# Patient Record
Sex: Male | Born: 1955
Health system: Southern US, Community
[De-identification: ages and names within clinical notes are randomized; demographics above are authoritative.]

## PROBLEM LIST (undated history)

## (undated) ENCOUNTER — Emergency Department (HOSPITAL_COMMUNITY): Admission: EM | Disposition: A | Discharge status: Home / Self Care | Payer: Self-pay

## (undated) DIAGNOSIS — J45909 Unspecified asthma, uncomplicated: Secondary | ICD-10-CM

## (undated) DIAGNOSIS — J449 Chronic obstructive pulmonary disease, unspecified: Secondary | ICD-10-CM

## (undated) DIAGNOSIS — I10 Essential (primary) hypertension: Secondary | ICD-10-CM

## (undated) HISTORY — PX: HAND SURGERY: SHX662

---

## 2004-03-05 ENCOUNTER — Emergency Department (HOSPITAL_COMMUNITY): Admission: EM | Admit: 2004-03-05 | Discharge: 2004-03-05 | Payer: Self-pay | Admitting: *Deleted

## 2004-09-27 ENCOUNTER — Emergency Department (HOSPITAL_COMMUNITY): Admission: EM | Admit: 2004-09-27 | Discharge: 2004-09-27 | Payer: Self-pay | Admitting: Family Medicine

## 2005-07-25 ENCOUNTER — Emergency Department (HOSPITAL_COMMUNITY): Admission: EM | Admit: 2005-07-25 | Discharge: 2005-07-25 | Payer: Self-pay | Admitting: Emergency Medicine

## 2005-07-29 ENCOUNTER — Inpatient Hospital Stay (HOSPITAL_COMMUNITY): Admission: EM | Admit: 2005-07-29 | Discharge: 2005-07-31 | Payer: Self-pay | Admitting: Emergency Medicine

## 2005-07-29 ENCOUNTER — Ambulatory Visit: Payer: Self-pay | Admitting: *Deleted

## 2005-07-31 ENCOUNTER — Encounter: Payer: Self-pay | Admitting: Cardiovascular Disease

## 2006-02-03 ENCOUNTER — Ambulatory Visit: Payer: Self-pay | Admitting: Family Medicine

## 2006-03-06 ENCOUNTER — Ambulatory Visit: Payer: Self-pay | Admitting: Family Medicine

## 2007-05-02 ENCOUNTER — Emergency Department (HOSPITAL_COMMUNITY): Admission: EM | Admit: 2007-05-02 | Discharge: 2007-05-02 | Payer: Self-pay | Admitting: Emergency Medicine

## 2007-05-06 ENCOUNTER — Emergency Department (HOSPITAL_COMMUNITY): Admission: EM | Admit: 2007-05-06 | Discharge: 2007-05-06 | Payer: Self-pay | Admitting: Emergency Medicine

## 2009-07-03 ENCOUNTER — Emergency Department (HOSPITAL_COMMUNITY): Admission: EM | Admit: 2009-07-03 | Discharge: 2009-07-03 | Payer: Self-pay | Admitting: Emergency Medicine

## 2010-04-01 LAB — POCT I-STAT, CHEM 8
BUN: 7 mg/dL (ref 6–23)
Creatinine, Ser: 1.1 mg/dL (ref 0.4–1.5)
Glucose, Bld: 145 mg/dL — ABNORMAL HIGH (ref 70–99)
Potassium: 4.2 mEq/L (ref 3.5–5.1)
Sodium: 137 mEq/L (ref 135–145)
TCO2: 29 mmol/L (ref 0–100)

## 2010-04-01 LAB — CBC
Hemoglobin: 14.4 g/dL (ref 13.0–17.0)
Platelets: 155 10*3/uL (ref 150–400)

## 2010-04-01 LAB — DIFFERENTIAL
Basophils Absolute: 0 10*3/uL (ref 0.0–0.1)
Basophils Relative: 0 % (ref 0–1)
Eosinophils Absolute: 0 10*3/uL (ref 0.0–0.7)
Eosinophils Relative: 0 % (ref 0–5)
Lymphocytes Relative: 12 % (ref 12–46)
Neutro Abs: 7.9 10*3/uL — ABNORMAL HIGH (ref 1.7–7.7)
Neutrophils Relative %: 74 % (ref 43–77)

## 2010-04-01 LAB — POCT CARDIAC MARKERS
CKMB, poc: 1.3 ng/mL (ref 1.0–8.0)
Myoglobin, poc: 120 ng/mL (ref 12–200)
Troponin i, poc: <0.05 ng/mL (ref 0.00–0.09)
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
## 2010-12-11 ENCOUNTER — Encounter: Payer: Self-pay | Admitting: Emergency Medicine

## 2010-12-11 ENCOUNTER — Emergency Department (HOSPITAL_COMMUNITY)
Admission: EM | Admit: 2010-12-11 | Discharge: 2010-12-11 | Disposition: A | Discharge status: Home / Self Care | Payer: No Typology Code available for payment source | Attending: Emergency Medicine | Admitting: Emergency Medicine

## 2010-12-11 DIAGNOSIS — L989 Disorder of the skin and subcutaneous tissue, unspecified: Secondary | ICD-10-CM | POA: Insufficient documentation

## 2010-12-11 DIAGNOSIS — M549 Dorsalgia, unspecified: Secondary | ICD-10-CM

## 2010-12-11 DIAGNOSIS — I10 Essential (primary) hypertension: Secondary | ICD-10-CM | POA: Insufficient documentation

## 2010-12-11 DIAGNOSIS — F17200 Nicotine dependence, unspecified, uncomplicated: Secondary | ICD-10-CM | POA: Insufficient documentation

## 2010-12-11 HISTORY — DX: Essential (primary) hypertension: I10

## 2010-12-11 MED ORDER — DIAZEPAM 5 MG PO TABS: 5.0000 mg | ORAL_TABLET | Freq: Every day | ORAL | Status: AC

## 2010-12-11 MED ORDER — ACETAMINOPHEN 500 MG PO TABS: 1000.0000 mg | ORAL_TABLET | Freq: Three times a day (TID) | ORAL | Status: AC | PRN

## 2010-12-11 MED ORDER — ACETAMINOPHEN 500 MG PO TABS
1000.0000 mg | ORAL_TABLET | Freq: Once | ORAL | Status: AC
  Administered 2010-12-11: 1000 mg via ORAL
  Filled 2010-12-11: qty 2

## 2010-12-11 MED ORDER — IBUPROFEN 800 MG PO TABS: 800.0000 mg | ORAL_TABLET | Freq: Three times a day (TID) | ORAL | Status: AC

## 2010-12-11 NOTE — ED Notes (Signed)
Pt ambulated to window with 3 prescrips with understanding of plan of care.
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
## 2010-12-11 NOTE — ED Notes (Signed)
Pt states also having headaches.
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
## 2010-12-17 ENCOUNTER — Emergency Department (HOSPITAL_COMMUNITY)
Admission: EM | Admit: 2010-12-17 | Discharge: 2010-12-17 | Discharge status: Against Medical Advice | Payer: No Typology Code available for payment source | Attending: Emergency Medicine | Admitting: Emergency Medicine

## 2010-12-17 DIAGNOSIS — R51 Headache: Secondary | ICD-10-CM | POA: Insufficient documentation

## 2010-12-17 DIAGNOSIS — M542 Cervicalgia: Secondary | ICD-10-CM | POA: Insufficient documentation

## 2011-04-03 ENCOUNTER — Emergency Department (HOSPITAL_COMMUNITY)
Admission: EM | Admit: 2011-04-03 | Discharge: 2011-04-03 | Discharge status: Against Medical Advice | Payer: Self-pay | Attending: Emergency Medicine | Admitting: Emergency Medicine

## 2011-04-03 ENCOUNTER — Emergency Department (HOSPITAL_COMMUNITY): Payer: Self-pay

## 2011-04-03 ENCOUNTER — Other Ambulatory Visit: Payer: Self-pay

## 2011-04-03 ENCOUNTER — Encounter (HOSPITAL_COMMUNITY): Payer: Self-pay | Admitting: Adult Health

## 2011-04-03 DIAGNOSIS — J449 Chronic obstructive pulmonary disease, unspecified: Secondary | ICD-10-CM | POA: Insufficient documentation

## 2011-04-03 DIAGNOSIS — J4489 Other specified chronic obstructive pulmonary disease: Secondary | ICD-10-CM | POA: Insufficient documentation

## 2011-04-03 DIAGNOSIS — R0602 Shortness of breath: Secondary | ICD-10-CM | POA: Insufficient documentation

## 2011-04-03 LAB — BASIC METABOLIC PANEL
BUN: 14 mg/dL (ref 6–23)
CO2: 24 mEq/L (ref 19–32)
Calcium: 8.7 mg/dL (ref 8.4–10.5)
GFR calc Af Amer: >90 mL/min (ref >90)
Glucose, Bld: 109 mg/dL — ABNORMAL HIGH (ref 70–99)
Potassium: 3.2 mEq/L — ABNORMAL LOW (ref 3.5–5.1)
Sodium: 136 mEq/L (ref 135–145)

## 2011-04-03 LAB — CBC
MCH: 31.8 pg (ref 26.0–34.0)
MCHC: 34.6 g/dL (ref 30.0–36.0)
MCV: 91.9 fL (ref 78.0–100.0)
Platelets: 171 10*3/uL (ref 150–400)
WBC: 8.8 10*3/uL (ref 4.0–10.5)

## 2011-04-03 MED ORDER — ACETAMINOPHEN 325 MG PO TABS
650.0000 mg | ORAL_TABLET | Freq: Once | ORAL | Status: AC
  Administered 2011-04-03: 650 mg via ORAL
  Filled 2011-04-03: qty 2

## 2011-04-03 MED ORDER — ALBUTEROL SULFATE (5 MG/ML) 0.5% IN NEBU
2.5000 mg | INHALATION_SOLUTION | Freq: Once | RESPIRATORY_TRACT | Status: AC
  Administered 2011-04-03: 5 mg via RESPIRATORY_TRACT
  Filled 2011-04-03: qty 1

## 2011-04-03 MED ORDER — IPRATROPIUM BROMIDE 0.02 % IN SOLN
0.5000 mg | Freq: Once | RESPIRATORY_TRACT | Status: AC
  Administered 2011-04-03: 0.5 mg via RESPIRATORY_TRACT
  Filled 2011-04-03: qty 2.5

## 2011-04-03 NOTE — ED Notes (Signed)
SOB that began 2 weeks ago and has gotten worse today, especially with exertion. HR 120, SATS on RA 96. EKG done. Inspiratory wheezes bilaterally, productive cough.,

## 2011-04-04 ENCOUNTER — Other Ambulatory Visit: Payer: Self-pay

## 2011-04-04 ENCOUNTER — Encounter (HOSPITAL_COMMUNITY): Payer: Self-pay

## 2011-04-04 ENCOUNTER — Emergency Department (HOSPITAL_COMMUNITY): Payer: Self-pay

## 2011-04-04 ENCOUNTER — Emergency Department (HOSPITAL_COMMUNITY)
Admission: EM | Admit: 2011-04-04 | Discharge: 2011-04-04 | Disposition: A | Discharge status: Home / Self Care | Payer: Self-pay | Attending: Emergency Medicine | Admitting: Emergency Medicine

## 2011-04-04 DIAGNOSIS — J441 Chronic obstructive pulmonary disease with (acute) exacerbation: Secondary | ICD-10-CM | POA: Insufficient documentation

## 2011-04-04 DIAGNOSIS — R059 Cough, unspecified: Secondary | ICD-10-CM | POA: Insufficient documentation

## 2011-04-04 DIAGNOSIS — R0602 Shortness of breath: Secondary | ICD-10-CM | POA: Insufficient documentation

## 2011-04-04 DIAGNOSIS — R05 Cough: Secondary | ICD-10-CM | POA: Insufficient documentation

## 2011-04-04 DIAGNOSIS — R062 Wheezing: Secondary | ICD-10-CM | POA: Insufficient documentation

## 2011-04-04 MED ORDER — ALBUTEROL SULFATE (5 MG/ML) 0.5% IN NEBU
INHALATION_SOLUTION | RESPIRATORY_TRACT | Status: AC
  Filled 2011-04-04: qty 1

## 2011-04-04 MED ORDER — ALBUTEROL SULFATE HFA 108 (90 BASE) MCG/ACT IN AERS
2.0000 | INHALATION_SPRAY | RESPIRATORY_TRACT | Status: DC
  Administered 2011-04-04: 2 via RESPIRATORY_TRACT
  Filled 2011-04-04: qty 6.7

## 2011-04-04 MED ORDER — PREDNISONE 20 MG PO TABS
60.0000 mg | ORAL_TABLET | Freq: Once | ORAL | Status: AC
  Administered 2011-04-04: 60 mg via ORAL
  Filled 2011-04-04: qty 3

## 2011-04-04 MED ORDER — IPRATROPIUM BROMIDE 0.02 % IN SOLN
RESPIRATORY_TRACT | Status: AC
  Filled 2011-04-04: qty 2.5

## 2011-04-04 MED ORDER — ALBUTEROL SULFATE (5 MG/ML) 0.5% IN NEBU
5.0000 mg | INHALATION_SOLUTION | Freq: Once | RESPIRATORY_TRACT | Status: AC
  Administered 2011-04-04: 5 mg via RESPIRATORY_TRACT
  Filled 2011-04-04: qty 0.5

## 2011-04-04 MED ORDER — PREDNISONE 10 MG PO TABS: 60.0000 mg | ORAL_TABLET | Freq: Every day | ORAL | Status: DC

## 2011-04-04 MED ORDER — IPRATROPIUM BROMIDE 0.02 % IN SOLN
0.5000 mg | Freq: Once | RESPIRATORY_TRACT | Status: AC
  Administered 2011-04-04: 0.5 mg via RESPIRATORY_TRACT
  Filled 2011-04-04: qty 2.5

## 2011-04-04 NOTE — Discharge Instructions (Signed)

## 2011-04-04 NOTE — ED Notes (Signed)
RT called

## 2011-04-04 NOTE — ED Provider Notes (Signed)
History     CSN: [id number]  Arrival date & time 04/04/11  8295   First MD Initiated Contact with Patient 04/04/11 1035      Chief Complaint  Patient presents with  . Cough     Patient is a 56 y.o. male presenting with cough. The history is provided by the patient.  Cough   the patient reports cough for several days with new worsening shortness of breath.  His shortness of breath is exertional.  He has no exertional chest pain jaw pain arm pain shoulder pain or back pain.  He denies nausea vomiting or diaphoresis.  He is a long-time smoker and reports productive cough.  He was seen in the ER last night and evaluated to the triage process but never sought provider and left the ER prior to being seen because of the long wait.  His labs obtained last night were normal.  He presented back to the ER today for evaluation since he was never seen last night.  He reports no orthopnea or paroxysmal nocturnal dyspnea.  He denies chest pain.  He has no prior cardiac history.  He does not see a primary care Dr. regularly.  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  At this time he expresses no significant shortness of breath.  He has had no recent long travel or surgery.  He denies unilateral leg swelling.  He has no prior history of cardiac disease.  He has no prior history of DVT or pulmonary embolism.  History reviewed. No pertinent past medical history.  Past Surgical History  Procedure Date  . Hand surgery     No family history on file.  History  Substance Use Topics  . Smoking status: Current Everyday Smoker -- 2.0 packs/day    Types: Cigarettes  . Smokeless tobacco: Not on file  . Alcohol Use: Yes      Review of Systems  Respiratory: Positive for cough.   All other systems reviewed and are negative.    Allergies  Codeine and Penicillins  Home Medications   Current Outpatient Rx  Name Route Sig Dispense Refill  . ALBUTEROL SULFATE HFA 108 (90 BASE) MCG/ACT IN AERS  Inhalation Inhale 2 puffs into the lungs every 6 (six) hours as needed. wheezing     . GUAIFENESIN 100 MG/5ML PO SOLN Oral Take 15 mLs by mouth every 4 (four) hours as needed. For cough/congestion    . IBUPROFEN 200 MG PO TABS Oral Take 200 mg by mouth every 6 (six) hours as needed. For pain    . PREDNISONE 10 MG PO TABS Oral Take 6 tablets (60 mg total) by mouth daily. 30 tablet 0    BP 116/74  Pulse 96  Temp(Src) 99 F (37.2 C) (Oral)  Resp 16  SpO2 97%  Physical Exam  Nursing note and vitals reviewed. Constitutional: He is oriented to person, place, and time. He appears well-developed and well-nourished.  HENT:  Head: Normocephalic and atraumatic.  Eyes: EOM are normal.  Neck: Normal range of motion.  Cardiovascular: Normal rate, regular rhythm, normal heart sounds and intact distal pulses.   Pulmonary/Chest: Effort normal. No respiratory distress. He has wheezes.       Mild wheezing bilaterally  Abdominal: Soft. He exhibits no distension. There is no tenderness.  Musculoskeletal: Normal range of motion.  Neurological: He is alert and oriented to person, place, and time.  Skin: Skin is warm and dry.  Psychiatric: He has a normal mood and  affect. Judgment normal.    ED Course  Procedures (including critical care time)  Date: 04/04/2011  Rate: 91  Rhythm: normal sinus rhythm  QRS Axis: normal  Intervals: normal  ST/T Wave abnormalities: normal  Conduction Disutrbances: none  Narrative Interpretation:   Old EKG Reviewed: No significant changes noted     Labs Reviewed  TROPONIN I   Dg Chest 2 View  04/04/2011  *RADIOLOGY REPORT*  Clinical Data: Cough and fever.  Tachycardia.  CHEST - 2 VIEW  Comparison: 04/03/2011.  Findings: Trachea is midline.  Heart size normal.  Lungs are hyperinflated but clear.  No pleural fluid.  Degenerative changes are seen in the spine.  IMPRESSION: No acute findings.  Original Report Authenticated By: Reyes Ivan, M.D.   Dg Chest 2  View  04/03/2011  *RADIOLOGY REPORT*  Clinical Data: SOB  CHEST - 2 VIEW  Comparison: 07/03/2009  Findings: Heart size and mediastinal contours are normal.  No pleural effusion or edema identified.  No airspace consolidation.  Lungs are hyperinflated and there are coarsened interstitial markings.  Multilevel thoracic spondylosis noted.  IMPRESSION:  1.  No acute findings. 2.  COPD.  Original Report Authenticated By: Rosealee Albee, M.D.   I personally reviewed his x-ray  1. COPD exacerbation       MDM  The patient has evidence of chronic bronchitis and likely COPD exacerbation.  He's been instructed to stop smoking cigarettes.  He needs a primary care Dr.  He feels much better after breathing treatment in the ER.  His chest x-ray is clear.  His EKG is normal sinus rhythm.  His labs obtained last night were within normal limits.  DC home in good condition.  He understands to return to the ER for new or worsening symptoms        Lyanne Co, MD 04/04/11 1233

## 2012-01-29 ENCOUNTER — Encounter (HOSPITAL_COMMUNITY): Payer: Self-pay

## 2012-01-29 ENCOUNTER — Emergency Department (HOSPITAL_COMMUNITY): Payer: Self-pay

## 2012-01-29 ENCOUNTER — Emergency Department (HOSPITAL_COMMUNITY)
Admission: EM | Admit: 2012-01-29 | Discharge: 2012-01-29 | Disposition: A | Discharge status: Home / Self Care | Payer: Self-pay | Attending: Emergency Medicine | Admitting: Emergency Medicine

## 2012-01-29 DIAGNOSIS — Z79899 Other long term (current) drug therapy: Secondary | ICD-10-CM | POA: Insufficient documentation

## 2012-01-29 DIAGNOSIS — F17200 Nicotine dependence, unspecified, uncomplicated: Secondary | ICD-10-CM | POA: Insufficient documentation

## 2012-01-29 DIAGNOSIS — J111 Influenza due to unidentified influenza virus with other respiratory manifestations: Secondary | ICD-10-CM | POA: Insufficient documentation

## 2012-01-29 DIAGNOSIS — J441 Chronic obstructive pulmonary disease with (acute) exacerbation: Secondary | ICD-10-CM | POA: Insufficient documentation

## 2012-01-29 LAB — GLUCOSE, CAPILLARY: Glucose-Capillary: 116 mg/dL — ABNORMAL HIGH (ref 70–99)

## 2012-01-29 MED ORDER — OSELTAMIVIR PHOSPHATE 75 MG PO CAPS: 75.0000 mg | ORAL_CAPSULE | Freq: Two times a day (BID) | ORAL | Status: DC

## 2012-01-29 MED ORDER — ALBUTEROL SULFATE (5 MG/ML) 0.5% IN NEBU
5.0000 mg | INHALATION_SOLUTION | Freq: Once | RESPIRATORY_TRACT | Status: AC
  Administered 2012-01-29: 5 mg via RESPIRATORY_TRACT
  Filled 2012-01-29: qty 1

## 2012-01-29 MED ORDER — PREDNISONE 20 MG PO TABS
60.0000 mg | ORAL_TABLET | Freq: Once | ORAL | Status: AC
  Administered 2012-01-29: 60 mg via ORAL
  Filled 2012-01-29: qty 3

## 2012-01-29 MED ORDER — ALBUTEROL SULFATE HFA 108 (90 BASE) MCG/ACT IN AERS: 2.0000 | INHALATION_SPRAY | RESPIRATORY_TRACT | Status: DC | PRN

## 2012-01-29 MED ORDER — PREDNISONE 50 MG PO TABS: ORAL_TABLET | ORAL | Status: DC

## 2012-01-29 MED ORDER — DOXYCYCLINE HYCLATE 100 MG PO CAPS: 100.0000 mg | ORAL_CAPSULE | Freq: Two times a day (BID) | ORAL | Status: DC

## 2012-01-29 MED ORDER — ACETAMINOPHEN 325 MG PO TABS
650.0000 mg | ORAL_TABLET | Freq: Once | ORAL | Status: AC
  Administered 2012-01-29: 650 mg via ORAL
  Filled 2012-01-29: qty 2

## 2012-01-29 MED ORDER — ALBUTEROL SULFATE HFA 108 (90 BASE) MCG/ACT IN AERS
2.0000 | INHALATION_SPRAY | Freq: Once | RESPIRATORY_TRACT | Status: AC
  Administered 2012-01-29: 2 via RESPIRATORY_TRACT
  Filled 2012-01-29: qty 6.7

## 2012-01-29 MED ORDER — IPRATROPIUM BROMIDE 0.02 % IN SOLN
0.5000 mg | Freq: Once | RESPIRATORY_TRACT | Status: AC
  Administered 2012-01-29: 0.5 mg via RESPIRATORY_TRACT
  Filled 2012-01-29: qty 2.5

## 2012-01-29 NOTE — ED Notes (Signed)
Pt. Has had flu-like symptoms for 2 days,   Body aches, fever, cough.  Pt.'s mother is giving him Mucinex for the flu.

## 2012-01-29 NOTE — ED Provider Notes (Signed)
History  This chart was scribed for Glynn Octave, MD by Bennett Scrape, ED Scribe. This patient was seen in room A02C/A02C and the patient's care was started at 8:53 AM.  CSN: [id number]  Arrival date & time 01/29/12  0845   First MD Initiated Contact with Patient 01/29/12 [phone]      Chief Complaint  Patient presents with  . Influenza     The history is provided by the patient. No language interpreter was used.    [name] is a 57 y.o. male who presents to the Emergency Department complaining of 2 days of gradual onset, gradually worsening, constant SOB with associated nasal congestion, nonproductive cough, subjective fevers, mild HA, myalgias, chills, and non-bloody, watery diarrhea. He states that the SOB is worse with laying down but denies that it is worse with exertion. He states that he has been taking mucinex with no improvement in his symptoms. He denies sore throat, CP, and abdominal pain as associated symptoms. He states that he has a h/o COPD and "sometimes uses inhalers" but denies using inhalers recently. He does not have a h/o other chronic medical conditions and denies taking daily medications. He denies getting an influenza vaccine. He is an everyday smoker and alcohol user.  History reviewed. No pertinent past medical history.  Past Surgical History  Procedure Date  . Hand surgery     No family history on file.  History  Substance Use Topics  . Smoking status: Current Every Day Smoker -- 2.0 packs/day    Types: Cigarettes  . Smokeless tobacco: Not on file  . Alcohol Use: Yes      Review of Systems  A complete 10 system review of systems was obtained and all systems are negative except as noted in the HPI and PMH.   Allergies  Codeine and Penicillins  Home Medications   Current Outpatient Rx  Name  Route  Sig  Dispense  Refill  . ALBUTEROL SULFATE HFA 108 (90 BASE) MCG/ACT IN AERS   Inhalation   Inhale 2 puffs into the lungs every 6  (six) hours as needed. wheezing          . PREDNISONE 10 MG PO TABS   Oral   Take 6 tablets (60 mg total) by mouth daily.   30 tablet   0     Triage Vitals: BP 147/87  Pulse 102  Temp 100.2 F (37.9 C) (Oral)  Resp 18  SpO2 96%  Physical Exam  Nursing note and vitals reviewed. Constitutional: He is oriented to person, place, and time. He appears well-developed and well-nourished. No distress.  HENT:  Head: Normocephalic and atraumatic.  Mouth/Throat: Oropharynx is clear and moist.       No sinus tenderness  Eyes: Conjunctivae normal and EOM are normal. Pupils are equal, round, and reactive to light.  Neck: Neck supple. No tracheal deviation present.       No menin  Cardiovascular: Normal rate and regular rhythm.   Pulmonary/Chest: Effort normal and breath sounds normal. No respiratory distress.       Decreased breath sounds with scattered wheezing  Abdominal: Soft. There is no tenderness.  Musculoskeletal: Normal range of motion. He exhibits no edema (no leg swelling).  Neurological: He is alert and oriented to person, place, and time.  Skin: Skin is warm and dry.  Psychiatric: He has a normal mood and affect. His behavior is normal.    ED Course  Procedures (including critical care time)  DIAGNOSTIC STUDIES:  Oxygen Saturation is 96% on room air, adequate by my interpretation.    COORDINATION OF CARE: 9:09 AM- Discussed treatment plan which includes CXR and breathing treatment with pt at bedside and pt agreed to plan.   9:15 AM- Ordered 60 mg prednisone tablet, 5 mg of 0.5% albuterol nebulizer solution and 0.5 mg of Atrovent solution.  10:15 AM-  12:02 PM-Pt rechecked and is resting comfortably. Upon re-exam, pt's lungs are clear. Discussed discharge plan of steroids and antibiotics. Advised pt to follow up with a PCP.  Labs Reviewed - No data to display Dg Chest 2 View  01/29/2012  *RADIOLOGY REPORT*  Clinical Data: Cough  CHEST - 2 VIEW  Comparison: Prior  chest x-ray 04/04/2011  Findings: Similar degree of pulmonary hyperexpansion with flattening of the diaphragm and increased retrosternal clear space on the lateral view.  Diffuse mild central airway thickening and peribronchial cuffing is also similar to prior.  Unchanged cardiac and mediastinal contours which are within normal limits.  No acute osseous abnormality.  IMPRESSION:  1.  No acute cardiopulmonary disease  2.  Stable background changes of pulmonary hyperexpansion and bronchial wall thickening suggestive of chronic bronchitis/COPD   Original Report Authenticated By: Malachy Moan, M.D.      No diagnosis found.    MDM  2 days of body aches, fever, cough and congestion. History of COPD, active smoker. Denies chest pain, leg pain or leg swelling.  CXR negative. Scattered wheezing on exam. Given nebs, steroids. Ambulatory in ED without desaturation. We'll treat patient for bronchitis and empirically for influenza. Smoking cessation encouraged.    Date: 01/29/2012  Rate: 99  Rhythm: normal sinus rhythm  QRS Axis: normal  Intervals: normal  ST/T Wave abnormalities: normal  Conduction Disutrbances:none  Narrative Interpretation:   Old EKG Reviewed: unchanged    I personally performed the services described in this documentation, which was scribed in my presence. The recorded information has been reviewed and is accurate.    Glynn Octave, MD 01/29/12 [phone]

## 2012-02-03 ENCOUNTER — Emergency Department (HOSPITAL_COMMUNITY): Payer: Self-pay

## 2012-02-03 ENCOUNTER — Encounter (HOSPITAL_COMMUNITY): Payer: Self-pay | Admitting: *Deleted

## 2012-02-03 ENCOUNTER — Inpatient Hospital Stay (HOSPITAL_COMMUNITY)
Admission: EM | Admit: 2012-02-03 | Discharge: 2012-02-06 | DRG: 193 | Disposition: A | Discharge status: Home / Self Care | Payer: MEDICAID | Attending: Internal Medicine | Admitting: Internal Medicine

## 2012-02-03 DIAGNOSIS — J9601 Acute respiratory failure with hypoxia: Secondary | ICD-10-CM

## 2012-02-03 DIAGNOSIS — Z720 Tobacco use: Secondary | ICD-10-CM | POA: Diagnosis present

## 2012-02-03 DIAGNOSIS — R0602 Shortness of breath: Secondary | ICD-10-CM

## 2012-02-03 DIAGNOSIS — Z6830 Body mass index (BMI) 30.0-30.9, adult: Secondary | ICD-10-CM

## 2012-02-03 DIAGNOSIS — E669 Obesity, unspecified: Secondary | ICD-10-CM

## 2012-02-03 DIAGNOSIS — J9600 Acute respiratory failure, unspecified whether with hypoxia or hypercapnia: Secondary | ICD-10-CM | POA: Diagnosis present

## 2012-02-03 DIAGNOSIS — F17200 Nicotine dependence, unspecified, uncomplicated: Secondary | ICD-10-CM

## 2012-02-03 DIAGNOSIS — Z880 Allergy status to penicillin: Secondary | ICD-10-CM

## 2012-02-03 DIAGNOSIS — J441 Chronic obstructive pulmonary disease with (acute) exacerbation: Secondary | ICD-10-CM | POA: Diagnosis present

## 2012-02-03 DIAGNOSIS — E876 Hypokalemia: Secondary | ICD-10-CM | POA: Diagnosis present

## 2012-02-03 DIAGNOSIS — J9602 Acute respiratory failure with hypercapnia: Secondary | ICD-10-CM

## 2012-02-03 DIAGNOSIS — J189 Pneumonia, unspecified organism: Principal | ICD-10-CM | POA: Diagnosis present

## 2012-02-03 HISTORY — DX: Chronic obstructive pulmonary disease, unspecified: J44.9

## 2012-02-03 LAB — CBC WITH DIFFERENTIAL/PLATELET
Basophils Absolute: 0.2 10*3/uL — ABNORMAL HIGH (ref 0.0–0.1)
Basophils Relative: 2 % — ABNORMAL HIGH (ref 0–1)
Eosinophils Absolute: 0 10*3/uL (ref 0.0–0.7)
Eosinophils Relative: 0 % (ref 0–5)
HCT: 39.5 % (ref 39.0–52.0)
Hemoglobin: 13.8 g/dL (ref 13.0–17.0)
Lymphocytes Relative: 13 % (ref 12–46)
Lymphs Abs: 1.3 10*3/uL (ref 0.7–4.0)
MCH: 32.5 pg (ref 26.0–34.0)
MCHC: 34.9 g/dL (ref 30.0–36.0)
MCV: 92.9 fL (ref 78.0–100.0)
Monocytes Absolute: 1.3 10*3/uL — ABNORMAL HIGH (ref 0.1–1.0)
Monocytes Relative: 13 % — ABNORMAL HIGH (ref 3–12)
Neutro Abs: 7 10*3/uL (ref 1.7–7.7)
Neutrophils Relative %: 72 % (ref 43–77)
Platelets: 200 10*3/uL (ref 150–400)
RBC: 4.25 MIL/uL (ref 4.22–5.81)
RDW: 14.5 % (ref 11.5–15.5)
WBC: 9.8 10*3/uL (ref 4.0–10.5)

## 2012-02-03 LAB — BASIC METABOLIC PANEL
BUN: 11 mg/dL (ref 6–23)
CO2: 30 mEq/L (ref 19–32)
Calcium: 8.7 mg/dL (ref 8.4–10.5)
Chloride: 93 mEq/L — ABNORMAL LOW (ref 96–112)
Creatinine, Ser: 0.82 mg/dL (ref 0.50–1.35)
GFR calc Af Amer: >90 mL/min (ref >90)
GFR calc non Af Amer: >90 mL/min (ref >90)
Glucose, Bld: 170 mg/dL — ABNORMAL HIGH (ref 70–99)
Potassium: 3 mEq/L — ABNORMAL LOW (ref 3.5–5.1)
Sodium: 134 mEq/L — ABNORMAL LOW (ref 135–145)

## 2012-02-03 LAB — INFLUENZA PANEL BY PCR (TYPE A & B)
H1N1 flu by pcr: NOT DETECTED
Influenza A By PCR: NEGATIVE
Influenza B By PCR: NEGATIVE

## 2012-02-03 LAB — MAGNESIUM: Magnesium: 2.3 mg/dL (ref 1.5–2.5)

## 2012-02-03 MED ORDER — ACETAMINOPHEN 325 MG PO TABS: 650.0000 mg | ORAL_TABLET | Freq: Four times a day (QID) | ORAL | Status: DC | PRN

## 2012-02-03 MED ORDER — SODIUM CHLORIDE 0.9 % IV SOLN: 250.0000 mL | INTRAVENOUS | Status: DC | PRN

## 2012-02-03 MED ORDER — DEXTROSE 5 % IV SOLN
1.0000 g | INTRAVENOUS | Status: DC
  Administered 2012-02-04 – 2012-02-05 (×2): 1 g via INTRAVENOUS
  Filled 2012-02-03 (×2): qty 10

## 2012-02-03 MED ORDER — SODIUM CHLORIDE 0.9 % IJ SOLN
3.0000 mL | Freq: Two times a day (BID) | INTRAMUSCULAR | Status: DC
  Administered 2012-02-04 – 2012-02-06 (×5): 3 mL via INTRAVENOUS

## 2012-02-03 MED ORDER — ALBUTEROL SULFATE HFA 108 (90 BASE) MCG/ACT IN AERS
2.0000 | INHALATION_SPRAY | RESPIRATORY_TRACT | Status: DC | PRN
  Filled 2012-02-03: qty 6.7

## 2012-02-03 MED ORDER — ALBUTEROL SULFATE (5 MG/ML) 0.5% IN NEBU
2.5000 mg | INHALATION_SOLUTION | Freq: Four times a day (QID) | RESPIRATORY_TRACT | Status: DC
  Administered 2012-02-03 – 2012-02-04 (×5): 2.5 mg via RESPIRATORY_TRACT
  Filled 2012-02-03 (×6): qty 0.5

## 2012-02-03 MED ORDER — SODIUM CHLORIDE 0.9 % IJ SOLN: 3.0000 mL | INTRAMUSCULAR | Status: DC | PRN

## 2012-02-03 MED ORDER — POTASSIUM CHLORIDE CRYS ER 20 MEQ PO TBCR
40.0000 meq | EXTENDED_RELEASE_TABLET | Freq: Once | ORAL | Status: AC
  Administered 2012-02-03: 40 meq via ORAL
  Filled 2012-02-03: qty 2

## 2012-02-03 MED ORDER — DEXTROSE 5 % IV SOLN
500.0000 mg | Freq: Once | INTRAVENOUS | Status: AC
  Administered 2012-02-03: 500 mg via INTRAVENOUS
  Filled 2012-02-03 (×2): qty 500

## 2012-02-03 MED ORDER — ACETAMINOPHEN 650 MG RE SUPP: 650.0000 mg | Freq: Four times a day (QID) | RECTAL | Status: DC | PRN

## 2012-02-03 MED ORDER — IPRATROPIUM BROMIDE 0.02 % IN SOLN
0.5000 mg | Freq: Once | RESPIRATORY_TRACT | Status: AC
  Administered 2012-02-03: 0.5 mg via RESPIRATORY_TRACT
  Filled 2012-02-03: qty 2.5

## 2012-02-03 MED ORDER — NICOTINE 21 MG/24HR TD PT24
21.0000 mg | MEDICATED_PATCH | Freq: Every day | TRANSDERMAL | Status: DC
  Administered 2012-02-03 – 2012-02-06 (×4): 21 mg via TRANSDERMAL
  Filled 2012-02-03 (×5): qty 1

## 2012-02-03 MED ORDER — METHYLPREDNISOLONE SODIUM SUCC 125 MG IJ SOLR
80.0000 mg | Freq: Four times a day (QID) | INTRAMUSCULAR | Status: DC
  Administered 2012-02-03 – 2012-02-04 (×3): 80 mg via INTRAVENOUS
  Filled 2012-02-03 (×6): qty 1.28

## 2012-02-03 MED ORDER — DEXTROSE 5 % IV SOLN
1.0000 g | Freq: Once | INTRAVENOUS | Status: AC
  Administered 2012-02-03: 1 g via INTRAVENOUS
  Filled 2012-02-03: qty 10

## 2012-02-03 MED ORDER — ALBUTEROL (5 MG/ML) CONTINUOUS INHALATION SOLN
10.0000 mg/h | INHALATION_SOLUTION | RESPIRATORY_TRACT | Status: AC
  Administered 2012-02-03: 10 mg/h via RESPIRATORY_TRACT

## 2012-02-03 MED ORDER — METHYLPREDNISOLONE SODIUM SUCC 125 MG IJ SOLR
125.0000 mg | Freq: Once | INTRAMUSCULAR | Status: AC
  Administered 2012-02-03: 125 mg via INTRAVENOUS
  Filled 2012-02-03: qty 2

## 2012-02-03 MED ORDER — SODIUM CHLORIDE 0.9 % IJ SOLN
3.0000 mL | Freq: Two times a day (BID) | INTRAMUSCULAR | Status: DC
  Administered 2012-02-04: 3 mL via INTRAVENOUS

## 2012-02-03 MED ORDER — GUAIFENESIN-DM 100-10 MG/5ML PO SYRP
5.0000 mL | ORAL_SOLUTION | ORAL | Status: DC | PRN
  Administered 2012-02-05: 5 mL via ORAL
  Filled 2012-02-03: qty 10

## 2012-02-03 MED ORDER — AZITHROMYCIN 500 MG PO TABS
500.0000 mg | ORAL_TABLET | ORAL | Status: DC
  Administered 2012-02-04 – 2012-02-05 (×2): 500 mg via ORAL
  Filled 2012-02-03 (×2): qty 1

## 2012-02-03 MED ORDER — ALBUTEROL SULFATE (5 MG/ML) 0.5% IN NEBU: 2.5000 mg | INHALATION_SOLUTION | RESPIRATORY_TRACT | Status: DC | PRN

## 2012-02-03 MED ORDER — ENOXAPARIN SODIUM 40 MG/0.4ML ~~LOC~~ SOLN
40.0000 mg | SUBCUTANEOUS | Status: DC
  Administered 2012-02-03 – 2012-02-05 (×3): 40 mg via SUBCUTANEOUS
  Filled 2012-02-03 (×5): qty 0.4

## 2012-02-03 MED ORDER — IPRATROPIUM BROMIDE 0.02 % IN SOLN
0.5000 mg | Freq: Four times a day (QID) | RESPIRATORY_TRACT | Status: DC
  Administered 2012-02-03 – 2012-02-04 (×5): 0.5 mg via RESPIRATORY_TRACT
  Filled 2012-02-03 (×6): qty 2.5

## 2012-02-03 NOTE — ED Notes (Signed)
Pt was here on 15th for same symptoms, was d/c'd with prescriptions for inhaler, prednisone, tamiflu, and antibiotic, did not get tamiflu and antibiotic filled.

## 2012-02-03 NOTE — ED Notes (Signed)
Floor RN unavailable to take report at this time.

## 2012-02-03 NOTE — Progress Notes (Signed)
[facility] ED CM noted CM consult for homelessness and trouble with getting medications.  CM spoke with pt and his mother in [place] (ED) Pt confirms his is a [place] resident with no pcp (been in [place] 1970s-moved from [place])  Cm reviewed MATCH program ($3 copays at pharmacy of choice) Pt agreed to [facility] program if he is a candidate.  Pt confirms not a previous health serve program Mother states she is on section 8 and pt can not live with her. Pt attempt to call his brother on his cell to move his car from ED parking lot.  Pt states unable to stay with family members.  Discussed SW consult to offered shelter information Reports not living in a shelter previously.   Mother is aware of "orange card" Cm discussed health serve closing and that the orange card is not insurance coverage but only assists with finding low cost providers in [place] Pt states he has never been sick but does agree to follow up with a self pay pcp if recommended. Cm further discussed and provided written information for self pay pcps, importance of pcp for f/u care, www.needymeds.org, discounted pharmacies, and other [place] resources such as financial assistance, DSS and  health department Reviewed Health connect number to assist with finding self pay provider close to pt's residence. Reviewed resources for [facility], general medical clinics, [facility] out patient [facility]ies, housing, and other resources in [place]. Pt voiced understanding and appreciation of resources provided

## 2012-02-03 NOTE — Progress Notes (Signed)
CSW attempted to assess patient for current homelessness. Pt currently being transferred to inpatient floor.  Unit CSW to follow up to assess.   Catha Gosselin, LCSWA  [phone] .02/03/2012 1629pm

## 2012-02-03 NOTE — ED Notes (Signed)
Pt ambulated on RA, started at 91%, while walking ranged from 88-91%, pt having to stop x 2 d/t shortness of breath and coughing.

## 2012-02-03 NOTE — H&P (Signed)
[facility] History and Physical  Bessie Livingood WUJ:[id number] DOB: 05-Oct-1955 DOA: 02/03/2012  Referring physician: ED PCP: No primary provider on file.   Chief Complaint:  SOB with wheezing, productive cough and subjective fever for 5-7 days  HPI:  57 Y/O male who is an active smoker presented to the ED with progressive SOB since past 5-7 days. This was associated with productive cough 9 whitish to greenish sputum) and wheezing with subjective feer and chills. denies any body aches or runny nose. He was seen in  ED few days back and discharged on albuterol inhaler, doxycycline and tamiflu. He could not afford the tamiflu and the doxycycline. He feels better for a day or two but again became progressively Short of breath with associated subjective fever and productive cough. Patient denies any headache, blurry vision, sinusitis, runny nose, body aches, chest pain, palpitations, abdominal pain, nausea, vomiting, bowel or urinary symptoms , joint pains. Denies similar symptoms in past. Able to ambulate without problem at baseline. Denies recent travel. In the ED he was tachycardic and tachypnic and actively wheezing with sats dropping to 88% on ambulation. Was given IV solumedrol, nebs after which he improved but still tachypnic and tachycardic. Labs unremarkable except for mild hypokalemia. EKG showed sinus tachycardia while CXR showed possible viral vs atypical pneumonia.  [facility]  [facility] to tele[facility] under observation.  Review of Systems:  Constitutional: subjective fever and chills ,  Denies diaphoresis, appetite change and fatigue.  HEENT: Denies photophobia, eye pain, redness, hearing loss, ear pain, congestion, sore throat, rhinorrhea, sneezing, mouth sores, trouble swallowing, neck pain, neck stiffness and tinnitus.   Respiratory:  SOB, DOE, cough,and wheezing. Denies chest tightness.     Cardiovascular: Denies chest pain, palpitations and leg swelling.    Gastrointestinal: Denies nausea, vomiting, abdominal pain, diarrhea, constipation, blood in stool and abdominal distention.  Genitourinary: Denies dysuria, urgency, frequency, hematuria, flank pain and difficulty urinating.  Musculoskeletal: Denies myalgias, back pain, joint swelling, arthralgias and gait problem.  Skin: Denies pallor, rash and wound.  Neurological: Denies dizziness, seizures, syncope, weakness, light-headedness, numbness and headaches.  Hematological: Denies adenopathy. Easy bruising, personal or family bleeding history  Psychiatric/Behavioral: Denies suicidal ideation, mood changes, confusion, nervousness, sleep disturbance and agitation   Past Medical History  Diagnosis Date  . COPD (chronic obstructive pulmonary disease)    Past Surgical History  Procedure Date  . Hand surgery    Social History:  reports that he has been smoking Cigarettes.  He has a 78 pack-year smoking history. He has never used smokeless tobacco. He reports that he drinks alcohol. He reports that he does not use illicit drugs.  Allergies  Allergen Reactions  . Codeine Itching  . Penicillins Itching    History reviewed. No pertinent family history.  Prior to Admission medications   Medication Sig Start Date End Date Taking? Authorizing Provider  albuterol (PROVENTIL HFA;VENTOLIN HFA) 108 (90 BASE) MCG/ACT inhaler Inhale 2 puffs into the lungs every 4 (four) hours as needed for wheezing. 01/29/12  Yes Glynn Octave, MD  OVER THE COUNTER MEDICATION Take 20 mLs by mouth every 4 (four) hours as needed. mucinex fast max cold flu and sore throat  For flu like symptoms   Yes Historical Provider, MD  predniSONE (DELTASONE) 50 MG tablet 1 tablet PO daily 01/29/12  Yes Glynn Octave, MD  doxycycline (VIBRAMYCIN) 100 MG capsule Take 1 capsule (100 mg total) by mouth 2 (two) times daily. 01/29/12   Glynn Octave, MD  oseltamivir (TAMIFLU) 75 MG capsule Take 1 capsule (75 mg total) by mouth every 12  (twelve) hours. 01/29/12   Glynn Octave, MD    Physical Exam:  Filed Vitals:   02/03/12 1000 02/03/12 1030 02/03/12 1100 02/03/12 1311  BP:    129/70  Pulse: 109 132 122 110  Temp:    98.6 F (37 C)  TempSrc:    Oral  Resp: 33 32 26 25  SpO2: 100% 99% 92% 95%    Constitutional: Vital signs reviewed.  Patient is a well-developed and well-nourished in no acute distress and cooperative with exam. Alert and oriented x3.  Head: Normocephalic and atraumatic Ear: TM normal bilaterally Mouth: no erythema or exudates, MMM Eyes: PERRL, EOMI, conjunctivae normal, No scleral icterus.  Neck: Supple, Trachea midline normal ROM, No JVD, mass, thyromegaly, or carotid bruit present.  Cardiovascular: S1&S2 tachycardic, no MRG, pulses symmetric and intact bilaterally Pulmonary/Chest: increased work of  breathing, gets tachypnic with cough, diffuse expiratory wheezes and rhonchi., bilateral crackles  Abdominal: Soft. Non-tender, non-distended, bowel sounds are normal, no masses, organomegaly, or guarding present.  GU: no CVA tenderness Musculoskeletal: No joint deformities, erythema, or stiffness, ROM full and no nontender Ext: no edema and no cyanosis, pulses palpable bilaterally (DP and PT) Hematology: no cervical, inginal, or axillary adenopathy.  Neurological: A&O x3, Strenght is normal and symmetric bilaterally, cranial nerve II-XII are grossly intact, no focal motor deficit, sensory intact to light touch bilaterally.  Skin: Warm, dry and intact. No rash, cyanosis, or clubbing.  Psychiatric: Normal mood and affect. speech and behavior is normal. Judgment and thought content normal. Cognition and memory are normal.   Labs on Admission:  Basic Metabolic Panel:  Lab 02/03/12 4540  NA 134*  K 3.0*  CL 93*  CO2 30  GLUCOSE 170*  BUN 11  CREATININE 0.82  CALCIUM 8.7  MG --  PHOS --   Liver Function Tests: No results found for this basename:  AST:5,ALT:5,ALKPHOS:5,BILITOT:5,PROT:5,ALBUMIN:5 in the last 168 hours No results found for this basename: LIPASE:5,AMYLASE:5 in the last 168 hours No results found for this basename: AMMONIA:5 in the last 168 hours CBC:  Lab 02/03/12 0916  WBC 9.8  NEUTROABS 7.0  HGB 13.8  HCT 39.5  MCV 92.9  PLT 200   Cardiac Enzymes: No results found for this basename: CKTOTAL:5,CKMB:5,CKMBINDEX:5,TROPONINI:5 in the last 168 hours BNP: No components found with this basename: POCBNP:5 CBG:  Lab 01/29/12 1134  GLUCAP 116*    Radiological Exams on Admission: Dg Chest 2 View  02/03/2012  *RADIOLOGY REPORT*  Clinical Data: Cough, congestion and shortness of breath.  CHEST - 2 VIEW  Comparison: 01/29/2012.  Findings: Trachea is midline.  Heart size normal.  Mild diffuse interstitial prominence and indistinctness, in somewhat of a reticulonodular pattern.  Lungs are hyperinflated.  No pleural fluid.  Degenerative changes are seen in the spine.  Mild pectus deformity.  IMPRESSION: Mild reticulonodular pattern in the lungs appears new from 01/29/2012 and can be seen with a viral or atypical pneumonia.   Original Report Authenticated By: Leanna Battles, M.D.     EKG: sinus tachy at 114, no ST-T changes  Assessment/Plan Principal Problem:  *COPD with exacerbation Admit to telemetry  patient received IV solumedrol and nebs in ED. sats currently maintained at 93-94% on 2 L via [place]  continue scheduled nebs and albuterol puffs prn Will order nicotine patch. Counseled on smoking cessation. Wishes  to seek help.ths seems to be his first episode of COPD exacerbation  that he can recall. Will need medications on discharge.   Active Problems:  Community acquired pneumonia As evident on CXR with associated subjective fever and productive cough. Given IV rocephin and azithro in ED which i will continue. Check blood cx, sputum cx, strep and legionella ag.    Tobacco abuse Counseled on cessation Nicotine  patch  Code Status: FULL Family Communication: wife at bedside Disposition Plan: home once stable  Eddie North [facility] Pager [phone]  If 7PM-7AM, please contact night-coverage [email or web address] Password [facility] 02/03/2012, 1:47 PM   Total time spent: 70 minutes

## 2012-02-03 NOTE — ED Notes (Signed)
Pt states been short of breath x 1 week, pt states it has been the same the whole week, pt states having "aggrivating chest discomfort". Pt denies n/v, states has had some diarrhea, when coughing states has a sore throat and body aches. Pt states has had a slight fever. Pt appears to be short of breath, states "I've been told I have COPD but never been confirmed".

## 2012-02-03 NOTE — ED Provider Notes (Signed)
Medical screening examination/treatment/procedure(s) were performed by non-physician practitioner and as supervising physician I was immediately available for consultation/collaboration.   Celene Kras, MD 02/03/12 [phone]

## 2012-02-03 NOTE — ED Notes (Signed)
Received pt to TCU. IV antibiotics infusing. Pt denies complaints. Drink given per pt request. Droplet precautions initiated.

## 2012-02-03 NOTE — ED Provider Notes (Signed)
History     CSN: [id number]  Arrival date & time 02/03/12  6295   First MD Initiated Contact with Patient 02/03/12 [phone]      Chief Complaint  Patient presents with  . Shortness of Breath    (Consider location/radiation/quality/duration/timing/severity/associated sxs/prior treatment) HPI Patient presents to the emergency room complaining of shortness of breath. This started about a week ago, and he was seen at [facility] days ago. He was diagnosed with COPD exacerbation, but he could only afford the albuterol inhaler and the prednisone prescription. He did not fill the doxycycline or the tamiflu. He complains of cough, wheezing, and says that after 5-10 feet of walking he has to gasp for air.  He complains of midline substernal chest pain when he coughs and when he takes deep breaths, but not with normal breathing. He has also had subjective fever and dizziness/lightheadedness upon standing up. He smokes 1-2 packs/day. He denies chills, abdominal pain, nausea, vomiting, diarrhea, weight loss, leg swelling and recent travel.  Past Medical History  Diagnosis Date  . COPD (chronic obstructive pulmonary disease)     Past Surgical History  Procedure Date  . Hand surgery     No family history on file.  History  Substance Use Topics  . Smoking status: Current Every Day Smoker -- 2.0 packs/day    Types: Cigarettes  . Smokeless tobacco: Never Used  . Alcohol Use: Yes      Review of Systems All other systems negative except as documented in the HPI. All pertinent positives and negatives as reviewed in the HPI.  Allergies  Codeine and Penicillins  Home Medications   Current Outpatient Rx  Name  Route  Sig  Dispense  Refill  . ALBUTEROL SULFATE HFA 108 (90 BASE) MCG/ACT IN AERS   Inhalation   Inhale 2 puffs into the lungs every 4 (four) hours as needed for wheezing.   1 Inhaler   0   . OVER THE COUNTER MEDICATION   Oral   Take 20 mLs by mouth every 4 (four) hours as  needed. mucinex fast max cold flu and sore throat  For flu like symptoms         . PREDNISONE 50 MG PO TABS      1 tablet PO daily   5 tablet   0   . DOXYCYCLINE HYCLATE 100 MG PO CAPS   Oral   Take 1 capsule (100 mg total) by mouth 2 (two) times daily.   20 capsule   0   . OSELTAMIVIR PHOSPHATE 75 MG PO CAPS   Oral   Take 1 capsule (75 mg total) by mouth every 12 (twelve) hours.   10 capsule   0     BP 171/84  Pulse 109  Temp 99.2 F (37.3 C) (Oral)  Resp 32  SpO2 99%  Physical Exam  Constitutional: He is oriented to person, place, and time. He appears well-developed and well-nourished. No distress.  HENT:  Head: Normocephalic and atraumatic.  Eyes: Conjunctivae normal are normal. Pupils are equal, round, and reactive to light.  Neck: Normal range of motion. Neck supple.  Cardiovascular: Regular rhythm, normal heart sounds and intact distal pulses.  Tachycardia present.        No dependent edema or unilateral leg swelling  Pulmonary/Chest: Tachypnea noted. He has wheezes in the right lower field and the left lower field. He has rales in the right upper field, the right middle field, the left upper field and the  left middle field.  Abdominal: Soft.  Lymphadenopathy:    He has no cervical adenopathy.  Neurological: He is alert and oriented to person, place, and time.  Skin: Skin is warm and dry. No rash noted. No erythema. No pallor.    ED Course  Procedures (including critical care time)   Labs Reviewed  CBC WITH DIFFERENTIAL  BASIC METABOLIC PANEL   Patient will be admitted to the hospital for COPD exacerbation, possible early pneumonia.  Patient was ambulated and desatted to 88%.  MDM   Date: 02/03/2012  Rate: 110  Rhythm: sinus tachycardia  QRS Axis: normal  Intervals: normal  ST/T Wave abnormalities: normal  Conduction Disutrbances:none  Narrative Interpretation:   Old EKG Reviewed: Tachy today          Carlyle Dolly,  PA-C 02/03/12 1307

## 2012-02-04 DIAGNOSIS — E669 Obesity, unspecified: Secondary | ICD-10-CM

## 2012-02-04 DIAGNOSIS — J9602 Acute respiratory failure with hypercapnia: Secondary | ICD-10-CM

## 2012-02-04 DIAGNOSIS — J9601 Acute respiratory failure with hypoxia: Secondary | ICD-10-CM

## 2012-02-04 HISTORY — DX: Obesity, unspecified: E66.9

## 2012-02-04 LAB — BASIC METABOLIC PANEL
BUN: 12 mg/dL (ref 6–23)
CO2: 30 mEq/L (ref 19–32)
Calcium: 8.6 mg/dL (ref 8.4–10.5)
Chloride: 100 mEq/L (ref 96–112)
Creatinine, Ser: 0.68 mg/dL (ref 0.50–1.35)

## 2012-02-04 LAB — HIV ANTIBODY (ROUTINE TESTING W REFLEX): HIV: NONREACTIVE

## 2012-02-04 LAB — LEGIONELLA ANTIGEN, URINE: Legionella Antigen, Urine: NEGATIVE

## 2012-02-04 LAB — STREP PNEUMONIAE URINARY ANTIGEN: Strep Pneumo Urinary Antigen: NEGATIVE

## 2012-02-04 MED ORDER — PREDNISONE 50 MG PO TABS
60.0000 mg | ORAL_TABLET | Freq: Every day | ORAL | Status: DC
  Administered 2012-02-05 – 2012-02-06 (×2): 60 mg via ORAL
  Filled 2012-02-04 (×3): qty 1

## 2012-02-04 NOTE — Progress Notes (Signed)
[facility] PROGRESS NOTE  Jaxtin Raimondo ZOX:[id number] DOB: 06-10-1955 DOA: 02/03/2012 PCP: No primary provider on file.  Assessment/Plan  *COPD with exacerbation with acute hypoxic respiratory failure, currently on 2L [place] but symptomatically better -  Transition to prednisone - Continue duonebs q6h with albuterol prn  -  Abx as below -  Outpatient PFTS -  Will need discharge medications for COPD as new diagnosis -  Wean oxygen as tolerated  Community acquired pneumonia "reticulonodular pattern typical of virus or atypical pneumonia"  Having subjective fevers and chills. -  Continue IV rocephin and azithro, day 2 -  blood cx NGTD -  sputum cx pending -  Flu neg, strep neg, and legionella ag NEG  Tobacco abuse  Counseled on cessation  Nicotine patch  Diet:  regular Access:  PIV IVF:  none Proph:  lovenox  Code Status: FULL  Family Communication:  Patient alone Disposition Plan:  Patient is homeless and will have difficulty affording his medications.  SW and CM following.     Consultants:  none  Procedures:  CXR  Antibiotics:  Ceftriaxone 1/20 >>  Azithromycin 1/20 >>   HPI/Subjective: Patient states that he feels less [place] of breath today while resting.  He continues to have productive cough and some subjective fevers.  Wheezing is less.  N/V has resolved and he was able to eat some breakfast.  Denies diarrhea, constipation, chest pain.    Objective: Filed Vitals:   02/04/12 8119 02/04/12 0845 02/04/12 1443 02/04/12 1524  BP: 137/85   140/77  Pulse: 80   102  Temp: 98 F (36.7 C)   98.1 F (36.7 C)  TempSrc: Oral   Oral  Resp: 16   20  Height:      Weight:      SpO2: 100% 91% 95% 95%    Intake/Output Summary (Last 24 hours) at 02/04/12 1528 Last data filed at 02/04/12 0930  Gross per 24 hour  Intake   1080 ml  Output    301 ml  Net    779 ml   Filed Weights   02/03/12 1647  Weight: 96.616 kg (213 lb)    Exam:   General:   Obese AAM, mild respiratory distress with forced exhalation and occasional SCM retractiosn  HEENT:  MMM  Cardiovascular:  RRR, no murmurs, rubs, or gallops  Respiratory:  Rales at the bilateral bases and high pitched full expiratory wheeze throughout with prolonged I:E.  No rhonchi.  Abdomen:  NABS, soft, nondistended, nontender, no organomegaly  MSK:  Normal tone and bulk  Neuro:  Grossly intact.    Data Reviewed: Basic Metabolic Panel:  Lab 02/04/12 1478 02/03/12 0916  NA 136 134*  K 4.1 3.0*  CL 100 93*  CO2 30 30  GLUCOSE 144* 170*  BUN 12 11  CREATININE 0.68 0.82  CALCIUM 8.6 8.7  MG -- 2.3  PHOS -- --   Liver Function Tests: No results found for this basename: AST:5,ALT:5,ALKPHOS:5,BILITOT:5,PROT:5,ALBUMIN:5 in the last 168 hours No results found for this basename: LIPASE:5,AMYLASE:5 in the last 168 hours No results found for this basename: AMMONIA:5 in the last 168 hours CBC:  Lab 02/03/12 0916  WBC 9.8  NEUTROABS 7.0  HGB 13.8  HCT 39.5  MCV 92.9  PLT 200   Cardiac Enzymes: No results found for this basename: CKTOTAL:5,CKMB:5,CKMBINDEX:5,TROPONINI:5 in the last 168 hours BNP (last 3 results) No results found for this basename: PROBNP:3 in the last 8760 hours CBG:  Lab 01/29/12 1134  GLUCAP 116*    Recent Results (from the past 240 hour(s))  CULTURE, BLOOD (ROUTINE X 2)     Status: Normal (Preliminary result)   Collection Time   02/03/12  5:50 PM      Component Value Range Status Comment   Specimen Description BLOOD LEFT ARM   Final    Special Requests BOTTLES DRAWN AEROBIC AND ANAEROBIC 5CC   Final    Culture  Setup Time 02/03/2012 23:38   Final    Culture     Final    Value:        BLOOD CULTURE RECEIVED NO GROWTH TO DATE CULTURE WILL BE HELD FOR 5 DAYS BEFORE ISSUING A FINAL NEGATIVE REPORT   Report Status PENDING   Incomplete   CULTURE, BLOOD (ROUTINE X 2)     Status: Normal (Preliminary result)   Collection Time   02/03/12  6:05 PM       Component Value Range Status Comment   Specimen Description BLOOD LEFT ARM   Final    Special Requests BOTTLES DRAWN AEROBIC AND ANAEROBIC 5CC   Final    Culture  Setup Time 02/03/2012 23:38   Final    Culture     Final    Value:        BLOOD CULTURE RECEIVED NO GROWTH TO DATE CULTURE WILL BE HELD FOR 5 DAYS BEFORE ISSUING A FINAL NEGATIVE REPORT   Report Status PENDING   Incomplete   CULTURE, EXPECTORATED SPUTUM-ASSESSMENT     Status: Normal   Collection Time   02/04/12  2:21 AM      Component Value Range Status Comment   Specimen Description SPUTUM   Final    Special Requests Normal   Final    Sputum evaluation     Final    Value: THIS SPECIMEN IS ACCEPTABLE. RESPIRATORY CULTURE REPORT TO FOLLOW.   Report Status 02/04/2012 FINAL   Final   CULTURE, RESPIRATORY     Status: Normal (Preliminary result)   Collection Time   02/04/12  2:21 AM      Component Value Range Status Comment   Specimen Description SPUTUM   Final    Special Requests NONE   Final    Gram Stain     Final    Value: MODERATE WBC PRESENT, PREDOMINANTLY PMN     RARE SQUAMOUS EPITHELIAL CELLS PRESENT     RARE GRAM POSITIVE RODS     RARE GRAM POSITIVE COCCI     IN PAIRS   Culture PENDING   Incomplete    Report Status PENDING   Incomplete      Studies: Dg Chest 2 View  02/03/2012  *RADIOLOGY REPORT*  Clinical Data: Cough, congestion and shortness of breath.  CHEST - 2 VIEW  Comparison: 01/29/2012.  Findings: Trachea is midline.  Heart size normal.  Mild diffuse interstitial prominence and indistinctness, in somewhat of a reticulonodular pattern.  Lungs are hyperinflated.  No pleural fluid.  Degenerative changes are seen in the spine.  Mild pectus deformity.  IMPRESSION: Mild reticulonodular pattern in the lungs appears new from 01/29/2012 and can be seen with a viral or atypical pneumonia.   Original Report Authenticated By: Leanna Battles, M.D.     Scheduled Meds:   . albuterol  2.5 mg Nebulization Q6H  .  azithromycin  500 mg Oral Q24H  . cefTRIAXone (ROCEPHIN)  IV  1 g Intravenous Q24H  . enoxaparin (LOVENOX) injection  40 mg Subcutaneous Q24H  . ipratropium  0.5  mg Nebulization Q6H  . methylPREDNISolone (SOLU-MEDROL) injection  80 mg Intravenous Q6H  . nicotine  21 mg Transdermal Daily  . sodium chloride  3 mL Intravenous Q12H  . sodium chloride  3 mL Intravenous Q12H   Continuous Infusions:   Principal Problem:  *COPD with exacerbation Active Problems:  Community acquired pneumonia  Tobacco abuse  Hypokalemia    Time spent: 30 min    Misa Fedorko  [facility] Pager [phone]. If 8PM-8AM, please contact night-coverage at [email or web address], password [facility] 02/04/2012, 3:28 PM  LOS: 1 day

## 2012-02-04 NOTE — Progress Notes (Signed)
Nutrition Brief Note  Patient identified on the Malnutrition Screening Tool (MST) Report  Body mass index is 30.56 kg/(m^2). Patient meets criteria for overweight based on current BMI.   Current diet order is Regular, patient is consuming approximately 100% of meals at this time. Labs and medications reviewed.   Pt admitted with shortness of breath.  Pt denies nutrition-related concerns stating he is currently eating well with good appetite.  Pt denies poor appetite or intake PTA.  RD notes pt has reported homelessness and is being followed by SW and base management.  No nutrition interventions warranted at this time. If nutrition issues arise, please consult RD.   Loyce Dys, MS RD LDN Clinical Inpatient Dietitian Pager: [phone] Weekend/After hours pager: [phone]

## 2012-02-05 DIAGNOSIS — R0602 Shortness of breath: Secondary | ICD-10-CM

## 2012-02-05 DIAGNOSIS — J9600 Acute respiratory failure, unspecified whether with hypoxia or hypercapnia: Secondary | ICD-10-CM

## 2012-02-05 MED ORDER — IPRATROPIUM BROMIDE 0.02 % IN SOLN
0.5000 mg | Freq: Four times a day (QID) | RESPIRATORY_TRACT | Status: DC
  Administered 2012-02-05 – 2012-02-06 (×5): 0.5 mg via RESPIRATORY_TRACT
  Filled 2012-02-05 (×5): qty 2.5

## 2012-02-05 MED ORDER — ALBUTEROL SULFATE (5 MG/ML) 0.5% IN NEBU
2.5000 mg | INHALATION_SOLUTION | Freq: Four times a day (QID) | RESPIRATORY_TRACT | Status: DC
  Administered 2012-02-05 – 2012-02-06 (×5): 2.5 mg via RESPIRATORY_TRACT
  Filled 2012-02-05 (×5): qty 0.5

## 2012-02-05 MED ORDER — LEVOFLOXACIN 750 MG PO TABS
750.0000 mg | ORAL_TABLET | Freq: Every day | ORAL | Status: DC
  Administered 2012-02-05 – 2012-02-06 (×2): 750 mg via ORAL
  Filled 2012-02-05 (×2): qty 1

## 2012-02-05 NOTE — Progress Notes (Signed)
[facility] PROGRESS NOTE  [name] NWG:[id number] DOB: [birth year] DOA: 02/03/2012 PCP: No primary provider on file.  Assessment/Plan  *COPD with exacerbation with acute hypoxic respiratory failure, off [place] but symptomatically better -  prednisone - Continue duonebs q6h with albuterol prn  -  Abx as below -  Outpatient PFTS -  Will need discharge medications for COPD as new diagnosis -  Wean oxygen as tolerated  Community acquired pneumonia "reticulonodular pattern typical of virus or atypical pneumonia"  Having subjective fevers and chills. -  Continue IV rocephin and azithro, day 3- change to PO levaquin -  blood cx NGTD -  sputum cx pending -  Flu neg, strep neg, and legionella ag NEG  Tobacco abuse  Counseled on cessation  Nicotine patch  Diet:  regular Access:  PIV IVF:  none Proph:  lovenox  Code Status: FULL  Family Communication:  Patient alone Disposition Plan:  Home tomm     Consultants:  none  Procedures:  CXR  Antibiotics:  Ceftriaxone 1/20 >>  Azithromycin 1/20 >>   HPI/Subjective: Sob improved No new c/o    Objective: Filed Vitals:   02/04/12 1914 02/04/12 2119 02/05/12 0503 02/05/12 0840  BP:  131/82 148/95   Pulse:  89 75   Temp:  97.9 F (36.6 C) 97.8 F (36.6 C)   TempSrc:  Oral Oral   Resp:  18 18   Height:      Weight:      SpO2: 95% 97% 100% 97%   No intake or output data in the 24 hours ending 02/05/12 1156 Filed Weights   02/03/12 1647  Weight: 96.616 kg (213 lb)    Exam:   General:  Obese AAM, no respiratory distress with forced exhalation   HEENT:  MMM  Cardiovascular:  RRR, no murmurs, rubs, or gallops  Respiratory:  Coarse breath sounds, .  Abdomen:  NABS, soft, nondistended, nontender, no organomegaly  MSK:  Normal tone and bulk  Neuro:  Grossly intact.    Data Reviewed: Basic Metabolic Panel:  Lab 02/04/12 5784 02/03/12 0916  NA 136 134*  K 4.1 3.0*  CL 100 93*  CO2 30 30    GLUCOSE 144* 170*  BUN 12 11  CREATININE 0.68 0.82  CALCIUM 8.6 8.7  MG -- 2.3  PHOS -- --   Liver Function Tests: No results found for this basename: AST:5,ALT:5,ALKPHOS:5,BILITOT:5,PROT:5,ALBUMIN:5 in the last 168 hours No results found for this basename: LIPASE:5,AMYLASE:5 in the last 168 hours No results found for this basename: AMMONIA:5 in the last 168 hours CBC:  Lab 02/03/12 0916  WBC 9.8  NEUTROABS 7.0  HGB 13.8  HCT 39.5  MCV 92.9  PLT 200   Cardiac Enzymes: No results found for this basename: CKTOTAL:5,CKMB:5,CKMBINDEX:5,TROPONINI:5 in the last 168 hours BNP (last 3 results) No results found for this basename: PROBNP:3 in the last 8760 hours CBG: No results found for this basename: GLUCAP:5 in the last 168 hours  Recent Results (from the past 240 hour(s))  CULTURE, BLOOD (ROUTINE X 2)     Status: Normal (Preliminary result)   Collection Time   02/03/12  5:50 PM      Component Value Range Status Comment   Specimen Description BLOOD LEFT ARM   Final    Special Requests BOTTLES DRAWN AEROBIC AND ANAEROBIC 5CC   Final    Culture  Setup Time 02/03/2012 23:38   Final    Culture     Final    Value:  BLOOD CULTURE RECEIVED NO GROWTH TO DATE CULTURE WILL BE HELD FOR 5 DAYS BEFORE ISSUING A FINAL NEGATIVE REPORT   Report Status PENDING   Incomplete   CULTURE, BLOOD (ROUTINE X 2)     Status: Normal (Preliminary result)   Collection Time   02/03/12  6:05 PM      Component Value Range Status Comment   Specimen Description BLOOD LEFT ARM   Final    Special Requests BOTTLES DRAWN AEROBIC AND ANAEROBIC 5CC   Final    Culture  Setup Time 02/03/2012 23:38   Final    Culture     Final    Value:        BLOOD CULTURE RECEIVED NO GROWTH TO DATE CULTURE WILL BE HELD FOR 5 DAYS BEFORE ISSUING A FINAL NEGATIVE REPORT   Report Status PENDING   Incomplete   CULTURE, EXPECTORATED SPUTUM-ASSESSMENT     Status: Normal   Collection Time   02/04/12  2:21 AM      Component Value  Range Status Comment   Specimen Description SPUTUM   Final    Special Requests Normal   Final    Sputum evaluation     Final    Value: THIS SPECIMEN IS ACCEPTABLE. RESPIRATORY CULTURE REPORT TO FOLLOW.   Report Status 02/04/2012 FINAL   Final   CULTURE, RESPIRATORY     Status: Normal (Preliminary result)   Collection Time   02/04/12  2:21 AM      Component Value Range Status Comment   Specimen Description SPUTUM   Final    Special Requests NONE   Final    Gram Stain     Final    Value: MODERATE WBC PRESENT, PREDOMINANTLY PMN     RARE SQUAMOUS EPITHELIAL CELLS PRESENT     RARE GRAM POSITIVE RODS     RARE GRAM POSITIVE COCCI     IN PAIRS   Culture NORMAL OROPHARYNGEAL FLORA   Final    Report Status PENDING   Incomplete      Studies: No results found.  Scheduled Meds:    . albuterol  2.5 mg Nebulization Q6H WA  . enoxaparin (LOVENOX) injection  40 mg Subcutaneous Q24H  . ipratropium  0.5 mg Nebulization Q6H WA  . levofloxacin  750 mg Oral Daily  . nicotine  21 mg Transdermal Daily  . predniSONE  60 mg Oral Q breakfast  . sodium chloride  3 mL Intravenous Q12H  . sodium chloride  3 mL Intravenous Q12H   Continuous Infusions:   Principal Problem:  *COPD with exacerbation Active Problems:  Community acquired pneumonia  Tobacco abuse  Obesity  Acute respiratory failure with hypoxia    Time spent: 30 min    Lorilei Horan  [facility] Pager [phone]. If 8PM-8AM, please contact night-coverage at [email or web address], password [facility] 02/05/2012, 11:56 AM  LOS: 2 days

## 2012-02-06 LAB — CULTURE, RESPIRATORY W GRAM STAIN

## 2012-02-06 MED ORDER — ALBUTEROL SULFATE HFA 108 (90 BASE) MCG/ACT IN AERS: 2.0000 | INHALATION_SPRAY | RESPIRATORY_TRACT | Status: DC | PRN

## 2012-02-06 MED ORDER — LEVOFLOXACIN 750 MG PO TABS: 750.0000 mg | ORAL_TABLET | Freq: Every day | ORAL | Status: DC

## 2012-02-06 MED ORDER — PREDNISONE 20 MG PO TABS: ORAL_TABLET | ORAL | Status: DC

## 2012-02-06 NOTE — Progress Notes (Signed)
CM in ER gave patient information on finding a PCP; talked to patient about affording his medication; patient stated as long as they are low cost ( on the $4.00 med list at [facility]), he can afford his medication. Abelino Derrick RN,BSN,MHA

## 2012-02-06 NOTE — Discharge Summary (Signed)
Physician Discharge Summary  [name] ZOX:[id number] DOB: 1955-03-20 DOA: 02/03/2012  PCP: No primary provider on file.  Admit date: 02/03/2012 Discharge date: 02/07/2012  Time spent: 35 minutes  Recommendations for Outpatient Follow-up:  1. Stop smoking 2.   Discharge Diagnoses:  Principal Problem:  *COPD with exacerbation Active Problems:  Community acquired pneumonia  Tobacco abuse  Obesity  Acute respiratory failure with hypoxia   Discharge Condition: improved  Diet recommendation: cardiac  Filed Weights   02/03/12 1647  Weight: 96.616 kg (213 lb)    History of present illness:  57 Y/O male who is an active smoker presented to the ED with progressive SOB since past 5-7 days. This was associated with productive cough 9 whitish to greenish sputum) and wheezing with subjective feer and chills. denies any body aches or runny nose. He was seen in ED few days back and discharged on albuterol inhaler, doxycycline and tamiflu. He could not afford the tamiflu and the doxycycline. He feels better for a day or two but again became progressively Short of breath with associated subjective fever and productive cough.  Patient denies any headache, blurry vision, sinusitis, runny nose, body aches, chest pain, palpitations, abdominal pain, nausea, vomiting, bowel or urinary symptoms , joint pains. Denies similar symptoms in past. Able to ambulate without problem at baseline. Denies recent travel.  In the ED he was tachycardic and tachypnic and actively wheezing with sats dropping to 88% on ambulation. Was given IV solumedrol, nebs after which he improved but still tachypnic and tachycardic. Labs unremarkable except for mild hypokalemia. EKG showed sinus tachycardia while CXR showed possible viral vs atypical pneumonia.  [facility] to tele[facility] under observation.   Hospital Course:  COPD with exacerbation with acute hypoxic respiratory failure, off [place] and  symptomatically better  - prednisone  - albuterol prn  - Abx  - Outpatient PFTS  - Will need discharge medications for COPD as new diagnosis  - Wean oxygen as tolerated   Community acquired pneumonia "reticulonodular pattern typical of virus or atypical pneumonia" Having subjective fevers and chills.  - Continue IV rocephin and azithro, day 3- change to PO levaquin  - blood cx NGTD  - sputum cx pending  - Flu neg, strep neg, and legionella ag NEG   Tobacco abuse  Counseled on cessation  Nicotine patch   Procedures:    Consultations:    Discharge Exam: Filed Vitals:   02/06/12 0208 02/06/12 0645 02/06/12 0804 02/06/12 1349  BP:  136/74  142/87  Pulse:  96  96  Temp:  98.2 F (36.8 C)  97.8 F (36.6 C)  TempSrc:  Oral  Oral  Resp:  20  18  Height:      Weight:      SpO2: 93% 98% 94% 95%    General: A+Ox3, NAD Cardiovascular: rrr Respiratory: clear anterior  Discharge Instructions      Discharge Orders    Future Orders Please Complete By Expires   Diet - low sodium heart healthy      Increase activity slowly      Discharge instructions      Comments:   Establish with PCP for follow up for COPD and prescriptions   Discharge instructions      Comments:   Stop smoking Outpatient PFTs       Medication List     As of 02/07/2012  4:16 PM    STOP taking these medications  doxycycline 100 MG capsule   Commonly known as: VIBRAMYCIN      oseltamivir 75 MG capsule   Commonly known as: TAMIFLU      TAKE these medications         albuterol 108 (90 BASE) MCG/ACT inhaler   Commonly known as: PROVENTIL HFA;VENTOLIN HFA   Inhale 2 puffs into the lungs every 4 (four) hours as needed for wheezing.      levofloxacin 750 MG tablet   Commonly known as: LEVAQUIN   Take 1 tablet (750 mg total) by mouth daily.      OVER THE COUNTER MEDICATION   Take 20 mLs by mouth every 4 (four) hours as needed. mucinex fast max cold flu and sore throat    For  flu like symptoms      predniSONE 20 MG tablet   Commonly known as: DELTASONE   10 mg: 50 mg x 3 days, 40 x 3 days, 30 x 3 days, 20 mg x 3 days, 10 mg x 3 days            The results of significant diagnostics from this hospitalization (including imaging, microbiology, ancillary and laboratory) are listed below for reference.    Significant Diagnostic Studies: Dg Chest 2 View  02/03/2012  *RADIOLOGY REPORT*  Clinical Data: Cough, congestion and shortness of breath.  CHEST - 2 VIEW  Comparison: 01/29/2012.  Findings: Trachea is midline.  Heart size normal.  Mild diffuse interstitial prominence and indistinctness, in somewhat of a reticulonodular pattern.  Lungs are hyperinflated.  No pleural fluid.  Degenerative changes are seen in the spine.  Mild pectus deformity.  IMPRESSION: Mild reticulonodular pattern in the lungs appears new from 01/29/2012 and can be seen with a viral or atypical pneumonia.   Original Report Authenticated By: Leanna Battles, M.D.    Dg Chest 2 View  01/29/2012  *RADIOLOGY REPORT*  Clinical Data: Cough  CHEST - 2 VIEW  Comparison: Prior chest x-ray 04/04/2011  Findings: Similar degree of pulmonary hyperexpansion with flattening of the diaphragm and increased retrosternal clear space on the lateral view.  Diffuse mild central airway thickening and peribronchial cuffing is also similar to prior.  Unchanged cardiac and mediastinal contours which are within normal limits.  No acute osseous abnormality.  IMPRESSION:  1.  No acute cardiopulmonary disease  2.  Stable background changes of pulmonary hyperexpansion and bronchial wall thickening suggestive of chronic bronchitis/COPD   Original Report Authenticated By: Malachy Moan, M.D.     Microbiology: Recent Results (from the past 240 hour(s))  CULTURE, BLOOD (ROUTINE X 2)     Status: Normal (Preliminary result)   Collection Time   02/03/12  5:50 PM      Component Value Range Status Comment   Specimen Description BLOOD  LEFT ARM   Final    Special Requests BOTTLES DRAWN AEROBIC AND ANAEROBIC 5CC   Final    Culture  Setup Time 02/03/2012 23:38   Final    Culture     Final    Value:        BLOOD CULTURE RECEIVED NO GROWTH TO DATE CULTURE WILL BE HELD FOR 5 DAYS BEFORE ISSUING A FINAL NEGATIVE REPORT   Report Status PENDING   Incomplete   CULTURE, BLOOD (ROUTINE X 2)     Status: Normal (Preliminary result)   Collection Time   02/03/12  6:05 PM      Component Value Range Status Comment   Specimen Description BLOOD LEFT ARM  Final    Special Requests BOTTLES DRAWN AEROBIC AND ANAEROBIC 5CC   Final    Culture  Setup Time 02/03/2012 23:38   Final    Culture     Final    Value:        BLOOD CULTURE RECEIVED NO GROWTH TO DATE CULTURE WILL BE HELD FOR 5 DAYS BEFORE ISSUING A FINAL NEGATIVE REPORT   Report Status PENDING   Incomplete   CULTURE, EXPECTORATED SPUTUM-ASSESSMENT     Status: Normal   Collection Time   02/04/12  2:21 AM      Component Value Range Status Comment   Specimen Description SPUTUM   Final    Special Requests Normal   Final    Sputum evaluation     Final    Value: THIS SPECIMEN IS ACCEPTABLE. RESPIRATORY CULTURE REPORT TO FOLLOW.   Report Status 02/04/2012 FINAL   Final   CULTURE, RESPIRATORY     Status: Normal   Collection Time   02/04/12  2:21 AM      Component Value Range Status Comment   Specimen Description SPUTUM   Final    Special Requests NONE   Final    Gram Stain     Final    Value: MODERATE WBC PRESENT, PREDOMINANTLY PMN     RARE SQUAMOUS EPITHELIAL CELLS PRESENT     RARE GRAM POSITIVE RODS     RARE GRAM POSITIVE COCCI     IN PAIRS   Culture NORMAL OROPHARYNGEAL FLORA   Final    Report Status 02/06/2012 FINAL   Final      Labs: Basic Metabolic Panel:  Lab 02/04/12 1610 02/03/12 0916  NA 136 134*  K 4.1 3.0*  CL 100 93*  CO2 30 30  GLUCOSE 144* 170*  BUN 12 11  CREATININE 0.68 0.82  CALCIUM 8.6 8.7  MG -- 2.3  PHOS -- --   Liver Function Tests: No results  found for this basename: AST:5,ALT:5,ALKPHOS:5,BILITOT:5,PROT:5,ALBUMIN:5 in the last 168 hours No results found for this basename: LIPASE:5,AMYLASE:5 in the last 168 hours No results found for this basename: AMMONIA:5 in the last 168 hours CBC:  Lab 02/03/12 0916  WBC 9.8  NEUTROABS 7.0  HGB 13.8  HCT 39.5  MCV 92.9  PLT 200   Cardiac Enzymes: No results found for this basename: CKTOTAL:5,CKMB:5,CKMBINDEX:5,TROPONINI:5 in the last 168 hours BNP: BNP (last 3 results) No results found for this basename: PROBNP:3 in the last 8760 hours CBG: No results found for this basename: GLUCAP:5 in the last 168 hours     Signed:  Marlin Canary  [facility] 02/07/2012, 4:16 PM

## 2012-02-06 NOTE — Progress Notes (Signed)
Patient is to be discharged home on Levoquin and pt cannot afford to pay for the medication. Patient qualifies for the [facility] program (medication assistance through [facility])- the program allows for a one time 34 day supply of medication for a low co pay - $3.00 per prescription. Information given to the patient and informed patient to go to [facility] pharmacy to get his prescription filled. Patient stated that he was going to get some money for his prescriptions from family members and then go to the pharmacy to get his medication. CM again informed the patient that he can only use this program once a year. Abelino Derrick RN,BSN,MHA

## 2012-02-09 LAB — CULTURE, BLOOD (ROUTINE X 2)
Culture: NO GROWTH
Culture: NO GROWTH

## 2012-02-13 ENCOUNTER — Emergency Department (HOSPITAL_COMMUNITY)
Admission: EM | Admit: 2012-02-13 | Discharge: 2012-02-13 | Disposition: A | Discharge status: Home / Self Care | Payer: Self-pay | Attending: Emergency Medicine | Admitting: Emergency Medicine

## 2012-02-13 ENCOUNTER — Encounter (HOSPITAL_COMMUNITY): Payer: Self-pay

## 2012-02-13 DIAGNOSIS — F17200 Nicotine dependence, unspecified, uncomplicated: Secondary | ICD-10-CM | POA: Insufficient documentation

## 2012-02-13 DIAGNOSIS — N508 Other specified disorders of male genital organs: Secondary | ICD-10-CM | POA: Insufficient documentation

## 2012-02-13 DIAGNOSIS — J4489 Other specified chronic obstructive pulmonary disease: Secondary | ICD-10-CM | POA: Insufficient documentation

## 2012-02-13 DIAGNOSIS — A510 Primary genital syphilis: Secondary | ICD-10-CM

## 2012-02-13 DIAGNOSIS — J449 Chronic obstructive pulmonary disease, unspecified: Secondary | ICD-10-CM | POA: Insufficient documentation

## 2012-02-13 LAB — RPR: RPR Ser Ql: NONREACTIVE

## 2012-02-13 MED ORDER — PENICILLIN G BENZATHINE 1200000 UNIT/2ML IM SUSP
2.4000 10*6.[IU] | Freq: Once | INTRAMUSCULAR | Status: AC
  Administered 2012-02-13: 2.4 10*6.[IU] via INTRAMUSCULAR
  Filled 2012-02-13: qty 2

## 2012-02-13 MED ORDER — DIPHENHYDRAMINE HCL 25 MG PO CAPS
25.0000 mg | ORAL_CAPSULE | Freq: Once | ORAL | Status: AC
  Administered 2012-02-13: 25 mg via ORAL
  Filled 2012-02-13: qty 1

## 2012-02-13 NOTE — Progress Notes (Signed)
[facility] ED CM spoke with pt who states he does not have insurance coverage States Med pay that is listed during this ED visit is not his coverage Cm discussed [place] self pay providers, health reform information with pt and gave written resources prior to d/c

## 2012-02-13 NOTE — ED Notes (Signed)
Patient reports that he noted a "white pimple" on his penis when he was washing yesterday.

## 2012-02-13 NOTE — ED Notes (Signed)
No reaction to PCN

## 2012-02-13 NOTE — ED Provider Notes (Signed)
History     CSN: [id number]  Arrival date & time 02/13/12  0948   First MD Initiated Contact with Patient 02/13/12 1001      Chief Complaint  Patient presents with  . penile problem     (Consider location/radiation/quality/duration/timing/severity/associated sxs/prior treatment) HPI Comments: Patient presents today with a chief complaint of a lesion on this penis.  He noticed this yesterday.  Lesion unchanged from onset.  He has only noticed one lesion.  He reports that the lesion is painless.  He currently is sexually active and does not use protection.  He denies prior history of sexually transmitted diseases.  He denies fever or chills.  Denies rash.  Denies dysuria.  Denies penile discharge.  He has attempted to put Peroxide on the lesion with no improvement.  He denies any drainage from the lesion.  The history is provided by the patient.    Past Medical History  Diagnosis Date  . COPD (chronic obstructive pulmonary disease)     Past Surgical History  Procedure Date  . Hand surgery     No family history on file.  History  Substance Use Topics  . Smoking status: Current Some Day Smoker -- 2.0 packs/day for 39 years    Types: Cigarettes  . Smokeless tobacco: Never Used  . Alcohol Use: Yes     Comment: "every once in awhile"      Review of Systems  Respiratory: Negative for shortness of breath.   Genitourinary: Positive for genital sores. Negative for dysuria, frequency, hematuria, penile swelling, scrotal swelling, penile pain and testicular pain.  All other systems reviewed and are negative.    Allergies  Codeine and Penicillins  Home Medications   Current Outpatient Rx  Name  Route  Sig  Dispense  Refill  . ALBUTEROL SULFATE HFA 108 (90 BASE) MCG/ACT IN AERS   Inhalation   Inhale 2 puffs into the lungs every 4 (four) hours as needed for wheezing.   1 Inhaler   2   . LEVOFLOXACIN 750 MG PO TABS   Oral   Take 1 tablet (750 mg total) by mouth  daily.   3 tablet   0   . OVER THE COUNTER MEDICATION   Oral   Take 20 mLs by mouth every 4 (four) hours as needed. mucinex fast max cold flu and sore throat  For flu like symptoms         . PREDNISONE 20 MG PO TABS      10 mg: 50 mg x 3 days, 40 x 3 days, 30 x 3 days, 20 mg x 3 days, 10 mg x 3 days   45 tablet   0     BP 152/98  Pulse 99  Temp 97.8 F (36.6 C) (Oral)  Resp 18  SpO2 98%  Physical Exam  Nursing note and vitals reviewed. Constitutional: He appears well-developed and well-nourished. No distress.  HENT:  Head: Normocephalic and atraumatic.  Mouth/Throat: Oropharynx is clear and moist.  Cardiovascular: Normal rate, regular rhythm and normal heart sounds.   Pulmonary/Chest: Effort normal and breath sounds normal.  Abdominal: Soft. There is no tenderness.  Genitourinary: Testes normal.    Right testis shows no mass, no swelling and no tenderness. Left testis shows no mass, no swelling and no tenderness. Uncircumcised. No penile tenderness. No discharge found.  Musculoskeletal: Normal range of motion.  Neurological: He is alert.  Skin: Skin is warm and dry. He is not diaphoretic.  Psychiatric: He has  a normal mood and affect.    ED Course  Procedures (including critical care time)   Labs Reviewed  GC/CHLAMYDIA PROBE AMP   No results found.   No diagnosis found.    MDM  Appearance of the painless penile lesion most consistent with [place].  RPR pending.  GC/Chlamydia also pending.  Patient given dose of IM Penicillin while in the ED.  Return precautions discussed with the patient.  Patient instructed to obtain HIV testing.        Pascal Lux [place], PA-C 02/13/12 [phone]

## 2012-02-14 LAB — GC/CHLAMYDIA PROBE AMP
CT Probe RNA: NEGATIVE
GC Probe RNA: NEGATIVE

## 2012-02-14 NOTE — ED Provider Notes (Signed)
Medical screening examination/treatment/procedure(s) were performed by non-physician practitioner and as supervising physician I was immediately available for consultation/collaboration.  Doug Sou, MD 02/14/12 [phone]

## 2013-03-22 ENCOUNTER — Emergency Department (HOSPITAL_COMMUNITY): Payer: Self-pay

## 2013-03-22 ENCOUNTER — Emergency Department (HOSPITAL_COMMUNITY)
Admission: EM | Admit: 2013-03-22 | Discharge: 2013-03-22 | Disposition: A | Discharge status: Home / Self Care | Payer: Self-pay | Attending: Emergency Medicine | Admitting: Emergency Medicine

## 2013-03-22 ENCOUNTER — Encounter (HOSPITAL_COMMUNITY): Payer: Self-pay | Admitting: Emergency Medicine

## 2013-03-22 DIAGNOSIS — Z880 Allergy status to penicillin: Secondary | ICD-10-CM | POA: Insufficient documentation

## 2013-03-22 DIAGNOSIS — Z79899 Other long term (current) drug therapy: Secondary | ICD-10-CM | POA: Insufficient documentation

## 2013-03-22 DIAGNOSIS — J441 Chronic obstructive pulmonary disease with (acute) exacerbation: Secondary | ICD-10-CM

## 2013-03-22 DIAGNOSIS — J45901 Unspecified asthma with (acute) exacerbation: Secondary | ICD-10-CM

## 2013-03-22 DIAGNOSIS — J449 Chronic obstructive pulmonary disease, unspecified: Secondary | ICD-10-CM | POA: Insufficient documentation

## 2013-03-22 DIAGNOSIS — F17200 Nicotine dependence, unspecified, uncomplicated: Secondary | ICD-10-CM | POA: Insufficient documentation

## 2013-03-22 DIAGNOSIS — J4489 Other specified chronic obstructive pulmonary disease: Secondary | ICD-10-CM | POA: Insufficient documentation

## 2013-03-22 DIAGNOSIS — J3489 Other specified disorders of nose and nasal sinuses: Secondary | ICD-10-CM | POA: Insufficient documentation

## 2013-03-22 DIAGNOSIS — R0989 Other specified symptoms and signs involving the circulatory and respiratory systems: Secondary | ICD-10-CM | POA: Insufficient documentation

## 2013-03-22 DIAGNOSIS — J069 Acute upper respiratory infection, unspecified: Secondary | ICD-10-CM

## 2013-03-22 DIAGNOSIS — R0789 Other chest pain: Secondary | ICD-10-CM | POA: Insufficient documentation

## 2013-03-22 HISTORY — DX: Unspecified asthma, uncomplicated: J45.909

## 2013-03-22 LAB — BASIC METABOLIC PANEL
BUN: 15 mg/dL (ref 6–23)
CHLORIDE: 102 meq/L (ref 96–112)
CO2: 30 meq/L (ref 19–32)
CREATININE: 1.05 mg/dL (ref 0.50–1.35)
Calcium: 9.1 mg/dL (ref 8.4–10.5)
GFR calc Af Amer: 90 mL/min — ABNORMAL LOW (ref >90)
GFR calc non Af Amer: 78 mL/min — ABNORMAL LOW (ref >90)
Glucose, Bld: 91 mg/dL (ref 70–99)
Potassium: 4.2 mEq/L (ref 3.7–5.3)
Sodium: 140 mEq/L (ref 137–147)

## 2013-03-22 LAB — CBC
HEMATOCRIT: 44.8 % (ref 39.0–52.0)
HEMOGLOBIN: 15.3 g/dL (ref 13.0–17.0)
MCH: 31.6 pg (ref 26.0–34.0)
MCHC: 34.2 g/dL (ref 30.0–36.0)
MCV: 92.6 fL (ref 78.0–100.0)
Platelets: 165 10*3/uL (ref 150–400)
RBC: 4.84 MIL/uL (ref 4.22–5.81)
RDW: 13.6 % (ref 11.5–15.5)
WBC: 4.4 10*3/uL (ref 4.0–10.5)

## 2013-03-22 LAB — I-STAT TROPONIN, ED: Troponin i, poc: 0.02 ng/mL (ref 0.00–0.08)

## 2013-03-22 MED ORDER — ALBUTEROL SULFATE (2.5 MG/3ML) 0.083% IN NEBU
5.0000 mg | INHALATION_SOLUTION | Freq: Once | RESPIRATORY_TRACT | Status: AC
  Administered 2013-03-22: 5 mg via RESPIRATORY_TRACT
  Filled 2013-03-22: qty 6

## 2013-03-22 MED ORDER — METHYLPREDNISOLONE SODIUM SUCC 125 MG IJ SOLR
125.0000 mg | Freq: Once | INTRAMUSCULAR | Status: AC
  Administered 2013-03-22: 125 mg via INTRAMUSCULAR

## 2013-03-22 MED ORDER — PREDNISONE 20 MG PO TABS: 40.0000 mg | ORAL_TABLET | Freq: Every day | ORAL | Status: DC

## 2013-03-22 MED ORDER — METHYLPREDNISOLONE SODIUM SUCC 125 MG IJ SOLR
125.0000 mg | Freq: Once | INTRAMUSCULAR | Status: DC
  Filled 2013-03-22: qty 2

## 2013-03-22 MED ORDER — ALBUTEROL SULFATE HFA 108 (90 BASE) MCG/ACT IN AERS
2.0000 | INHALATION_SPRAY | Freq: Once | RESPIRATORY_TRACT | Status: AC
  Administered 2013-03-22: 2 via RESPIRATORY_TRACT
  Filled 2013-03-22: qty 6.7

## 2013-03-22 MED ORDER — IPRATROPIUM BROMIDE 0.02 % IN SOLN
0.5000 mg | Freq: Once | RESPIRATORY_TRACT | Status: AC
  Administered 2013-03-22: 0.5 mg via RESPIRATORY_TRACT
  Filled 2013-03-22: qty 2.5

## 2013-03-22 MED ORDER — ALBUTEROL SULFATE HFA 108 (90 BASE) MCG/ACT IN AERS: 2.0000 | INHALATION_SPRAY | RESPIRATORY_TRACT | Status: DC | PRN

## 2013-03-22 NOTE — Progress Notes (Signed)
[facility] provided pt with a list of primary care resources and a [facility] Card application to help patient establish primary care.  °

## 2013-03-22 NOTE — Discharge Instructions (Signed)
Please follow up with your primary care physician in 1-2 days. If you do not have one please call the [facility] number listed above. Please take your inhaler 2 puffs every four to six hours for the next few days. Please take Motrin as prescribed. Please read all discharge instructions and return precautions.     Asthma, Acute Bronchospasm Acute bronchospasm caused by asthma is also referred to as an asthma attack. Bronchospasm means your air passages become narrowed. The narrowing is caused by inflammation and tightening of the muscles in the air tubes (bronchi) in your lungs. This can make it hard to breath or cause you to wheeze and cough. CAUSES Possible triggers are:  Animal dander from the skin, hair, or feathers of animals.  Dust mites contained in house dust.  Cockroaches.  Pollen from trees or grass.  Mold.  Cigarette or tobacco smoke.  Air pollutants such as dust, household cleaners, hair sprays, aerosol sprays, paint fumes, strong chemicals, or strong odors.  Cold air or weather changes. Cold air may trigger inflammation. Winds increase molds and pollens in the air.  Strong emotions such as crying or laughing hard.  Stress.  Certain medicines such as aspirin or beta-blockers.  Sulfites in foods and drinks, such as dried fruits and wine.  Infections or inflammatory conditions, such as a flu, cold, or inflammation of the nasal membranes (rhinitis).  Gastroesophageal reflux disease (GERD). GERD is a condition where stomach acid backs up into your throat (esophagus).  Exercise or strenuous activity. SIGNS AND SYMPTOMS   Wheezing.  Excessive coughing, particularly at night.  Chest tightness.  Shortness of breath. DIAGNOSIS  Your health care provider will ask you about your medical history and perform a physical exam. A chest X-ray or blood testing may be performed to look for other causes of your symptoms or other conditions that may have  triggered your asthma attack. TREATMENT  Treatment is aimed at reducing inflammation and opening up the airways in your lungs. Most asthma attacks are treated with inhaled medicines. These include quick relief or rescue medicines (such as bronchodilators) and controller medicines (such as inhaled corticosteroids). These medicines are sometimes given through an inhaler or a nebulizer. Systemic steroid medicine taken by mouth or given through an IV tube also can be used to reduce the inflammation when an attack is moderate or severe. Antibiotic medicines are only used if a bacterial infection is present.  HOME CARE INSTRUCTIONS   Rest.  Drink plenty of liquids. This helps the mucus to remain thin and be easily coughed up. Only use caffeine in moderation and do not use alcohol until you have recovered from your illness.  Do not smoke. Avoid being exposed to secondhand smoke.  You play a critical role in keeping yourself in good health. Avoid exposure to things that cause you to wheeze or to have breathing problems.  Keep your medicines up to date and available. Carefully follow your health care provider's treatment plan.  Take your medicine exactly as prescribed.  When pollen or pollution is bad, keep windows closed and use an air conditioner or go to places with air conditioning.  Asthma requires careful medical care. See your health care provider for a follow-up as advised. If you are more than [redacted] weeks pregnant and you were prescribed any new medicines, let your obstetrician know about the visit and how you are doing. Follow-up with your health care provider as directed.  After you have recovered from your asthma  attack, make an appointment with your outpatient doctor to talk about ways to reduce the likelihood of future attacks. If you do not have a doctor who manages your asthma, make an appointment with a primary care doctor to discuss your asthma. SEEK IMMEDIATE MEDICAL CARE IF:   You  are getting worse.  You have trouble breathing. If severe, call your local emergency services (911 in the [place]  You develop chest pain or discomfort.  You are vomiting.  You are not able to keep fluids down.  You are coughing up yellow, green, brown, or bloody sputum.  You have a fever and your symptoms suddenly get worse.  You have trouble swallowing. MAKE SURE YOU:   Understand these instructions.  Will watch your condition.  Will get help right away if you are not doing well or get worse. Document Released: 04/17/2006 Document Revised: 09/02/2012 Document Reviewed: 07/08/2012 [facility] Patient Information 2014 [place], [place]. Upper Respiratory Infection, Adult An upper respiratory infection (URI) is also sometimes known as the common cold. The upper respiratory tract includes the nose, sinuses, throat, trachea, and bronchi. Bronchi are the airways leading to the lungs. Most people improve within 1 week, but symptoms can last up to 2 weeks. A residual cough may last even longer.  CAUSES Many different viruses can infect the tissues lining the upper respiratory tract. The tissues become irritated and inflamed and often become very moist. Mucus production is also common. A cold is contagious. You can easily spread the virus to others by oral contact. This includes kissing, sharing a glass, coughing, or sneezing. Touching your mouth or nose and then touching a surface, which is then touched by another person, can also spread the virus. SYMPTOMS  Symptoms typically develop 1 to 3 days after you come in contact with a cold virus. Symptoms vary from person to person. They may include:  Runny nose.  Sneezing.  Nasal congestion.  Sinus irritation.  Sore throat.  Loss of voice (laryngitis).  Cough.  Fatigue.  Muscle aches.  Loss of appetite.  Headache.  Low-grade fever. DIAGNOSIS  You might diagnose your own cold based on familiar symptoms, since most people get a  cold 2 to 3 times a year. Your caregiver can confirm this based on your exam. Most importantly, your caregiver can check that your symptoms are not due to another disease such as strep throat, sinusitis, pneumonia, asthma, or epiglottitis. Blood tests, throat tests, and X-rays are not necessary to diagnose a common cold, but they may sometimes be helpful in excluding other more serious diseases. Your caregiver will decide if any further tests are required. RISKS AND COMPLICATIONS  You may be at risk for a more severe case of the common cold if you smoke cigarettes, have chronic heart disease (such as heart failure) or lung disease (such as asthma), or if you have a weakened immune system. The very young and very old are also at risk for more serious infections. Bacterial sinusitis, middle ear infections, and bacterial pneumonia can complicate the common cold. The common cold can worsen asthma and chronic obstructive pulmonary disease (COPD). Sometimes, these complications can require emergency medical care and may be life-threatening. PREVENTION  The best way to protect against getting a cold is to practice good hygiene. Avoid oral or hand contact with people with cold symptoms. Wash your hands often if contact occurs. There is no clear evidence that vitamin C, vitamin E, echinacea, or exercise reduces the chance of developing a cold. However, it  is always recommended to get plenty of rest and practice good nutrition. TREATMENT  Treatment is directed at relieving symptoms. There is no cure. Antibiotics are not effective, because the infection is caused by a virus, not by bacteria. Treatment may include:  Increased fluid intake. Sports drinks offer valuable electrolytes, sugars, and fluids.  Breathing heated mist or steam (vaporizer or shower).  Eating chicken soup or other clear broths, and maintaining good nutrition.  Getting plenty of rest.  Using gargles or lozenges for comfort.  Controlling  fevers with ibuprofen or acetaminophen as directed by your caregiver.  Increasing usage of your inhaler if you have asthma. Zinc gel and zinc lozenges, taken in the first 24 hours of the common cold, can shorten the duration and lessen the severity of symptoms. Pain medicines may help with fever, muscle aches, and throat pain. A variety of non-prescription medicines are available to treat congestion and runny nose. Your caregiver can make recommendations and may suggest nasal or lung inhalers for other symptoms.  HOME CARE INSTRUCTIONS   Only take over-the-counter or prescription medicines for pain, discomfort, or fever as directed by your caregiver.  Use a warm mist humidifier or inhale steam from a shower to increase air moisture. This may keep secretions moist and make it easier to breathe.  Drink enough water and fluids to keep your urine clear or pale yellow.  Rest as needed.  Return to work when your temperature has returned to normal or as your caregiver advises. You may need to stay home longer to avoid infecting others. You can also use a face mask and careful hand washing to prevent spread of the virus. SEEK MEDICAL CARE IF:   After the first few days, you feel you are getting worse rather than better.  You need your caregiver's advice about medicines to control symptoms.  You develop chills, worsening shortness of breath, or brown or red sputum. These may be signs of pneumonia.  You develop yellow or brown nasal discharge or pain in the face, especially when you bend forward. These may be signs of sinusitis.  You develop a fever, swollen neck glands, pain with swallowing, or white areas in the back of your throat. These may be signs of strep throat. SEEK IMMEDIATE MEDICAL CARE IF:   You have a fever.  You develop severe or persistent headache, ear pain, sinus pain, or chest pain.  You develop wheezing, a prolonged cough, cough up blood, or have a change in your usual mucus  (if you have chronic lung disease).  You develop sore muscles or a stiff neck. Document Released: 06/26/2000 Document Revised: 03/25/2011 Document Reviewed: 05/04/2010 [facility] Patient Information 2014 [place], [place].

## 2013-03-22 NOTE — ED Notes (Signed)
Patient transported to X-ray 

## 2013-03-22 NOTE — ED Notes (Signed)
RT notified for neb order.

## 2013-03-22 NOTE — ED Provider Notes (Signed)
CSN: [id number]     Arrival date & time 03/22/13  1345 History   First MD Initiated Contact with Patient 03/22/13 1504     Chief Complaint  Patient presents with  . Shortness of Breath     (Consider location/radiation/quality/duration/timing/severity/associated sxs/prior Treatment) HPI Comments: Patient is a 58 year old male past medical history significant for COPD, asthma presented to the emergency department for 2 weeks of nonproductive cough, nasal congestion, rhinorrhea, chest tightness, wheezing with associated shortness of breath. Patient states today is the first day he has noticed improvement of his symptoms. He states exertion and laying down aggravate his symptoms while rest improves his symptoms. Patient has not tried his rescue inhaler at home as he does not have one. Denies fevers, chills, CP, abdominal pain, nausea, vomiting, diarrhea.  Patient is a 58 y.o. male presenting with shortness of breath.  Shortness of Breath Associated symptoms: cough and wheezing   Associated symptoms: no abdominal pain, no chest pain, no fever and no vomiting     Past Medical History  Diagnosis Date  . COPD (chronic obstructive pulmonary disease)   . Asthma    Past Surgical History  Procedure Laterality Date  . Hand surgery     No family history on file. History  Substance Use Topics  . Smoking status: Current Some Day Smoker -- 2.00 packs/day for 39 years    Types: Cigarettes  . Smokeless tobacco: Never Used  . Alcohol Use: Yes     Comment: "every once in awhile"    Review of Systems  Constitutional: Negative for fever and chills.  HENT: Positive for congestion and rhinorrhea.   Respiratory: Positive for cough, chest tightness, shortness of breath and wheezing.   Cardiovascular: Negative for chest pain, palpitations and leg swelling.  Gastrointestinal: Negative for nausea, vomiting, abdominal pain and diarrhea.  All other systems reviewed and are negative.      Allergies   Codeine and Penicillins  Home Medications   Current Outpatient Rx  Name  Route  Sig  Dispense  Refill  . ACETAMINOPHEN PO   Oral   Take 1 tablet by mouth every 6 (six) hours as needed (pain).         Marland Kitchen albuterol (PROVENTIL HFA;VENTOLIN HFA) 108 (90 BASE) MCG/ACT inhaler   Inhalation   Inhale 2 puffs into the lungs every 4 (four) hours as needed for wheezing.   1 Inhaler   2   . albuterol (PROVENTIL HFA;VENTOLIN HFA) 108 (90 BASE) MCG/ACT inhaler   Inhalation   Inhale 2 puffs into the lungs every 4 (four) hours as needed for wheezing or shortness of breath.   1 Inhaler   1   . predniSONE (DELTASONE) 20 MG tablet   Oral   Take 2 tablets (40 mg total) by mouth daily.   10 tablet   0    BP 142/86  Pulse 109  Temp(Src) 98.5 F (36.9 C) (Oral)  Resp 18  SpO2 94% Physical Exam  Nursing note and vitals reviewed. Constitutional: He is oriented to person, place, and time. He appears well-developed and well-nourished. No distress.  HENT:  Head: Normocephalic and atraumatic.  Right Ear: External ear normal.  Left Ear: External ear normal.  Nose: Nose normal.  Mouth/Throat: Oropharynx is clear and moist. No oropharyngeal exudate.  Eyes: Conjunctivae are normal.  Neck: Neck supple.  Cardiovascular: Normal rate, regular rhythm, normal heart sounds and intact distal pulses.   Pulmonary/Chest: Effort normal. No accessory muscle usage. Not tachypneic and not bradypneic.  No respiratory distress. He has no decreased breath sounds. He has wheezes in the right upper field, the right middle field, the right lower field, the left upper field, the left middle field and the left lower field. He has no rhonchi. He has no rales. He exhibits no tenderness.  Patient talking in complete sentences without difficulty. Walking around the room without increased work of breathing.  Abdominal: Soft. There is no tenderness.  Musculoskeletal: Normal range of motion. He exhibits no edema.   Lymphadenopathy:    He has no cervical adenopathy.  Neurological: He is alert and oriented to person, place, and time.  Moves all extremities.   Skin: Skin is warm and dry. He is not diaphoretic.    ED Course  Procedures (including critical care time) Medications  albuterol (PROVENTIL) (2.5 MG/3ML) 0.083% nebulizer solution 5 mg (5 mg Nebulization Given 03/22/13 1507)  albuterol (PROVENTIL) (2.5 MG/3ML) 0.083% nebulizer solution 5 mg (5 mg Nebulization Given 03/22/13 1553)  ipratropium (ATROVENT) nebulizer solution 0.5 mg (0.5 mg Nebulization Given 03/22/13 1553)  methylPREDNISolone sodium succinate (SOLU-MEDROL) 125 mg/2 mL injection 125 mg (125 mg Intramuscular Given 03/22/13 1555)  albuterol (PROVENTIL) (2.5 MG/3ML) 0.083% nebulizer solution 5 mg (5 mg Nebulization Given 03/22/13 1638)  albuterol (PROVENTIL HFA;VENTOLIN HFA) 108 (90 BASE) MCG/ACT inhaler 2 puff (2 puffs Inhalation Given 03/22/13 1730)    Labs Review Labs Reviewed  BASIC METABOLIC PANEL - Abnormal; Notable for the following:    GFR calc non Af Amer 78 (*)    GFR calc Af Amer 90 (*)    All other components within normal limits  CBC  I-STAT TROPOININ, ED   Imaging Review Dg Chest 2 View (if Patient Has Fever And/or Copd)  03/22/2013   CLINICAL DATA:  Cough and congestion  EXAM: CHEST  2 VIEW  COMPARISON:  02/03/2012  FINDINGS: Cardiac shadow is stable. The lungs are well aerated bilaterally without focal infiltrate or sizable effusion. Degenerative change of the thoracic spine is noted.  IMPRESSION: No acute abnormality seen.   Electronically Signed   By: Inez Catalina M.D.   On: 03/22/2013 14:18     EKG Interpretation   Date/Time:  Monday March 22 2013 14:22:47 EDT Ventricular Rate:  95 PR Interval:  127 QRS Duration: 79 QT Interval:  350 QTC Calculation: 440 R Axis:   80 Text Interpretation:  Sinus rhythm No significant change since last  tracing Confirmed by BEATON  MD, ROBERT ([id number]) on 03/22/2013 4:36:39 PM       MDM   Final diagnoses:  Asthma exacerbation with COPD (chronic obstructive pulmonary disease)  URI (upper respiratory infection)    Filed Vitals:   03/22/13 1733  BP: 142/86  Pulse: 109  Temp: 98.5 F (36.9 C)  Resp: 18    Afebrile, NAD, non-toxic appearing, AAOx4. I have reviewed nursing notes, vital signs, and all appropriate lab and imaging results for this patient.  Patient with upper respiratory symptoms. No respiratory distress. No accessory muscle use. Diffuse wheezing noted on initial examination. Patient able to talk in complete sentences without difficulty and ambulate in room with no increased work of breathing. Chest x-ray negative for any pneumonia or other acute cardio pulmonary process. Patient ambulated in ED with O2 saturations maintained >90, no current signs of respiratory distress. Lung exam improved after nebulizer treatment. Solu-medrol given in the ED and pt will bd dc with 5 day burst. Pt states they are breathing at baseline. Pt has been instructed to continue using prescribed  medications and to speak with PCP about today's exacerbation. Return precautions discussed. Patient is agreeable to plan and discharged. Patient is stable at time of discharge      Harlow Mares, PA-C 03/22/13 2345

## 2013-03-22 NOTE — ED Notes (Signed)
Pt c/o shob times two weeks. Pt states he has asthma and COPD.

## 2013-03-23 NOTE — ED Provider Notes (Signed)
Medical screening examination/treatment/procedure(s) were performed by non-physician practitioner and as supervising physician I was immediately available for consultation/collaboration.   Ader Fritze L Krystel Fletchall, MD 03/23/13 1251 

## 2013-04-26 ENCOUNTER — Ambulatory Visit: Payer: Self-pay | Attending: Internal Medicine

## 2013-05-12 ENCOUNTER — Encounter (HOSPITAL_COMMUNITY): Payer: Self-pay | Admitting: Emergency Medicine

## 2013-05-12 ENCOUNTER — Emergency Department (HOSPITAL_COMMUNITY): Payer: Self-pay

## 2013-05-12 ENCOUNTER — Emergency Department (HOSPITAL_COMMUNITY)
Admission: EM | Admit: 2013-05-12 | Discharge: 2013-05-12 | Disposition: A | Discharge status: Home / Self Care | Payer: Self-pay | Attending: Emergency Medicine | Admitting: Emergency Medicine

## 2013-05-12 DIAGNOSIS — J45901 Unspecified asthma with (acute) exacerbation: Principal | ICD-10-CM

## 2013-05-12 DIAGNOSIS — F17200 Nicotine dependence, unspecified, uncomplicated: Secondary | ICD-10-CM | POA: Insufficient documentation

## 2013-05-12 DIAGNOSIS — J441 Chronic obstructive pulmonary disease with (acute) exacerbation: Secondary | ICD-10-CM | POA: Insufficient documentation

## 2013-05-12 DIAGNOSIS — Z79899 Other long term (current) drug therapy: Secondary | ICD-10-CM | POA: Insufficient documentation

## 2013-05-12 DIAGNOSIS — Z880 Allergy status to penicillin: Secondary | ICD-10-CM | POA: Insufficient documentation

## 2013-05-12 MED ORDER — ALBUTEROL SULFATE (2.5 MG/3ML) 0.083% IN NEBU
5.0000 mg | INHALATION_SOLUTION | Freq: Once | RESPIRATORY_TRACT | Status: AC
  Administered 2013-05-12: 5 mg via RESPIRATORY_TRACT
  Filled 2013-05-12: qty 6

## 2013-05-12 MED ORDER — ALBUTEROL SULFATE HFA 108 (90 BASE) MCG/ACT IN AERS: 2.0000 | INHALATION_SPRAY | RESPIRATORY_TRACT | Status: DC | PRN

## 2013-05-12 MED ORDER — ALBUTEROL SULFATE HFA 108 (90 BASE) MCG/ACT IN AERS
2.0000 | INHALATION_SPRAY | RESPIRATORY_TRACT | Status: DC | PRN
  Filled 2013-05-12: qty 6.7

## 2013-05-12 MED ORDER — AZITHROMYCIN 250 MG PO TABS: 250.0000 mg | ORAL_TABLET | Freq: Every day | ORAL | Status: DC

## 2013-05-12 MED ORDER — PREDNISONE 20 MG PO TABS
60.0000 mg | ORAL_TABLET | Freq: Once | ORAL | Status: AC
  Administered 2013-05-12: 60 mg via ORAL
  Filled 2013-05-12: qty 3

## 2013-05-12 MED ORDER — IPRATROPIUM BROMIDE 0.02 % IN SOLN
0.5000 mg | Freq: Once | RESPIRATORY_TRACT | Status: AC
  Administered 2013-05-12: 0.5 mg via RESPIRATORY_TRACT
  Filled 2013-05-12 (×2): qty 2.5

## 2013-05-12 MED ORDER — PREDNISONE 20 MG PO TABS: 40.0000 mg | ORAL_TABLET | Freq: Every day | ORAL | Status: DC

## 2013-05-12 NOTE — Discharge Instructions (Signed)
Chronic Obstructive Pulmonary Disease Exacerbation °Chronic obstructive pulmonary disease (COPD) is a common lung condition in which airflow from the lungs is limited. COPD is a general term that can be used to describe many different lung problems that limit airflow, including chronic bronchitis and emphysema. COPD exacerbations are episodes when breathing symptoms become much worse and require extra treatment. Without treatment, COPD exacerbations can be life threatening, and frequent COPD exacerbations can cause further damage to your lungs. °CAUSES  °· Respiratory infections.   °· Exposure to smoke.   °· Exposure to air pollution, chemical fumes, or dust. °Sometimes there is no apparent cause or trigger. °RISK FACTORS °· Smoking cigarettes. °· Older age. °· Frequent prior COPD exacerbations. °SIGNS AND SYMPTOMS  °· Increased coughing.   °· Increased thick spit (sputum) production.   °· Increased wheezing.   °· Increased shortness of breath.   °· Rapid breathing.   °· Chest tightness. °DIAGNOSIS  °Your medical history, a physical exam, and tests will help your health care provider make a diagnosis. Tests may include: °· A chest X-ray. °· Basic lab tests. °· Sputum testing. °· An arterial blood gas test. °TREATMENT  °Depending on the severity of your COPD exacerbation, you may need to be admitted to a hospital for treatment. Some of the treatments commonly used to treat COPD exacerbations are:  °· Antibiotic medicines.   °· Bronchodilators. These are drugs that expand the air passages. They may be given with an inhaler or nebulizer. Spacer devices may be needed to help improve drug delivery. °· Corticosteroid medicines. °· Supplemental oxygen therapy.   °HOME CARE INSTRUCTIONS  °· Do not smoke. Quitting smoking is very important to prevent COPD from getting worse and exacerbations from happening as often. °· Avoid exposure to all substances that irritate the airway, especially to tobacco smoke.   °· If prescribed,  take your antibiotics as directed. Finish them even if you start to feel better. °· Only take over-the-counter or prescription medicines as directed by your health care provider. It is important to use correct technique with inhaled medicines. °· Drink enough fluids to keep your urine clear or pale yellow (unless you have a medical condition that requires fluid restriction). °· Use a cool mist vaporizer. This makes it easier to clear your chest when you cough.   °· If you have a home nebulizer and oxygen, continue to use them as directed.   °· Maintain all necessary vaccinations to prevent infections.   °· Exercise regularly.   °· Eat a healthy diet.   °· Keep all follow-up appointments as directed by your health care provider. °SEEK IMMEDIATE MEDICAL CARE IF: °· You have worsening shortness of breath.   °· You have trouble talking.   °· You have severe chest pain. °· You have blood in your sputum.  °· You have a fever. °· You have weakness, vomit repeatedly, or faint.   °· You feel confused.   °· You continue to get worse. °MAKE SURE YOU:  °· Understand these instructions. °· Will watch your condition. °· Will get help right away if you are not doing well or get worse. °Document Released: 10/28/2006 Document Revised: 10/21/2012 Document Reviewed: 09/04/2012 °ExitCare® Patient Information ©2014 [employer]. ° °

## 2013-05-12 NOTE — ED Notes (Signed)
Per pt, has hx of copd, asthma.  About 2 days ago, started having increased cough and shortness of breath.  Out of inhaler at home.

## 2013-05-12 NOTE — Progress Notes (Signed)
[facility] provided pt with a list of primary care resources and a [facility] Card application to help patient establish primary care.  °

## 2013-05-12 NOTE — ED Notes (Signed)
Patient refused to put a gown on. 

## 2013-05-12 NOTE — ED Provider Notes (Signed)
CSN: [id number]     Arrival date & time 05/12/13  1325 History   First MD Initiated Contact with Patient 05/12/13 1506     Chief Complaint  Patient presents with  . Shortness of Breath  . Cough     (Consider location/radiation/quality/duration/timing/severity/associated sxs/prior Treatment) HPI Comments: Patient presents to the ED with a chief complaint of cough and wheezing.  Patient states that he has a history of COPD and asthma.  He has been short of breath and wheezing for the past 2 days.  He states that his inhaler was helping significantly, but he has run out.  He denies any chest pain.  The wheezing and SOB is exacerbated by being outside and with activity.  No other health problems.  The history is provided by the patient. No language interpreter was used.    Past Medical History  Diagnosis Date  . COPD (chronic obstructive pulmonary disease)   . Asthma    Past Surgical History  Procedure Laterality Date  . Hand surgery     History reviewed. No pertinent family history. History  Substance Use Topics  . Smoking status: Current Some Day Smoker -- 2.00 packs/day for 39 years    Types: Cigarettes  . Smokeless tobacco: Never Used  . Alcohol Use: Yes     Comment: "every once in awhile"    Review of Systems  Constitutional: Negative for fever and chills.  Respiratory: Positive for cough and wheezing. Negative for shortness of breath.   Cardiovascular: Negative for chest pain.  Gastrointestinal: Negative for nausea, vomiting, diarrhea and constipation.  Genitourinary: Negative for dysuria.      Allergies  Codeine and Penicillins  Home Medications   Prior to Admission medications   Medication Sig Start Date End Date Taking? Authorizing Provider  albuterol (PROVENTIL HFA;VENTOLIN HFA) 108 (90 BASE) MCG/ACT inhaler Inhale 2 puffs into the lungs every 4 (four) hours as needed for wheezing. 02/06/12  Yes Jessica U Vann, DO   BP 138/92  Pulse 100  Temp(Src) 97.8 F  (36.6 C) (Oral)  Resp 16  SpO2 97% Physical Exam  Nursing note and vitals reviewed. Constitutional: He is oriented to person, place, and time. He appears well-developed and well-nourished.  HENT:  Head: Normocephalic and atraumatic.  Eyes: Conjunctivae and EOM are normal. Pupils are equal, round, and reactive to light. Right eye exhibits no discharge. Left eye exhibits no discharge. No scleral icterus.  Neck: Normal range of motion. Neck supple. No JVD present.  Cardiovascular: Normal rate, regular rhythm and normal heart sounds.  Exam reveals no gallop and no friction rub.   No murmur heard. Pulmonary/Chest: Effort normal. No respiratory distress. He has wheezes. He has no rales. He exhibits no tenderness.  Bilateral expiratory wheezes  Abdominal: Soft. He exhibits no distension and no mass. There is no tenderness. There is no rebound and no guarding.  Musculoskeletal: Normal range of motion. He exhibits no edema and no tenderness.  Neurological: He is alert and oriented to person, place, and time.  Skin: Skin is warm and dry.  Psychiatric: He has a normal mood and affect. His behavior is normal. Judgment and thought content normal.    ED Course  Procedures (including critical care time) Labs Review Labs Reviewed - No data to display  Imaging Review Dg Chest 2 View  05/12/2013   CLINICAL DATA:  Shortness of breath, COPD, cough.  EXAM: CHEST  2 VIEW  COMPARISON:  03/22/2013  FINDINGS: There is hyperinflation of the lungs compatible  with COPD. Heart and mediastinal contours are within normal limits. No focal opacities or effusions. No acute bony abnormality.  IMPRESSION: COPD.  No active disease.   Electronically Signed   By: Rolm Baptise M.D.   On: 05/12/2013 14:52     EKG Interpretation None      MDM   Final diagnoses:  COPD with exacerbation    Patient with asthma exacerbation.  Will treat with nebs and steroids.  Will also give prednisone and a z-pak because he is a  smoker and has COPD.  No respiratory distress.  4:17 PM Patient reassessed.  Still wheezing, but states he feels better.  Will give an additional nebs.  5:08 PM Patient feels improved.  Ambulates without difficulty in the ED.  No chest pain.  Maintains >90% O2 sat with ambulation.  Will discharge with inhaler, prednisone and z-pak.  Montine Circle, PA-C 05/12/13 1709

## 2013-05-12 NOTE — ED Provider Notes (Signed)
Medical screening examination/treatment/procedure(s) were performed by non-physician practitioner and as supervising physician I was immediately available for consultation/collaboration.   Kathalene Frames, MD 05/12/13 [phone]

## 2013-07-15 ENCOUNTER — Encounter: Payer: Self-pay | Admitting: Internal Medicine

## 2013-07-15 ENCOUNTER — Ambulatory Visit: Payer: No Typology Code available for payment source | Attending: Internal Medicine | Admitting: Internal Medicine

## 2013-07-15 VITALS — BP 150/80 | HR 83 | Temp 99.3°F | Resp 17 | Wt 236.6 lb

## 2013-07-15 DIAGNOSIS — R030 Elevated blood-pressure reading, without diagnosis of hypertension: Secondary | ICD-10-CM | POA: Insufficient documentation

## 2013-07-15 DIAGNOSIS — J449 Chronic obstructive pulmonary disease, unspecified: Secondary | ICD-10-CM

## 2013-07-15 DIAGNOSIS — IMO0001 Reserved for inherently not codable concepts without codable children: Secondary | ICD-10-CM

## 2013-07-15 DIAGNOSIS — Z139 Encounter for screening, unspecified: Secondary | ICD-10-CM

## 2013-07-15 DIAGNOSIS — Z720 Tobacco use: Secondary | ICD-10-CM

## 2013-07-15 DIAGNOSIS — Z1211 Encounter for screening for malignant neoplasm of colon: Secondary | ICD-10-CM

## 2013-07-15 DIAGNOSIS — J4489 Other specified chronic obstructive pulmonary disease: Secondary | ICD-10-CM | POA: Insufficient documentation

## 2013-07-15 DIAGNOSIS — F17200 Nicotine dependence, unspecified, uncomplicated: Secondary | ICD-10-CM | POA: Insufficient documentation

## 2013-07-15 LAB — CBC WITH DIFFERENTIAL/PLATELET
BASOS PCT: 0 % (ref 0–1)
Basophils Absolute: 0 10*3/uL (ref 0.0–0.1)
Eosinophils Absolute: 0.1 10*3/uL (ref 0.0–0.7)
Eosinophils Relative: 3 % (ref 0–5)
HCT: 42.9 % (ref 39.0–52.0)
HEMOGLOBIN: 14.8 g/dL (ref 13.0–17.0)
LYMPHS ABS: 1.6 10*3/uL (ref 0.7–4.0)
Lymphocytes Relative: 46 % (ref 12–46)
MCH: 31.9 pg (ref 26.0–34.0)
MCHC: 34.5 g/dL (ref 30.0–36.0)
MCV: 92.5 fL (ref 78.0–100.0)
Monocytes Absolute: 0.3 10*3/uL (ref 0.1–1.0)
Monocytes Relative: 9 % (ref 3–12)
NEUTROS PCT: 42 % — AB (ref 43–77)
Neutro Abs: 1.4 10*3/uL — ABNORMAL LOW (ref 1.7–7.7)
PLATELETS: 181 10*3/uL (ref 150–400)
RBC: 4.64 MIL/uL (ref 4.22–5.81)
RDW: 15.5 % (ref 11.5–15.5)
WBC: 3.4 10*3/uL — ABNORMAL LOW (ref 4.0–10.5)

## 2013-07-15 LAB — LIPID PANEL
CHOL/HDL RATIO: 4.8 ratio
Cholesterol: 184 mg/dL (ref 0–200)
HDL: 38 mg/dL — AB (ref >39)
LDL Cholesterol: 127 mg/dL — ABNORMAL HIGH (ref 0–99)
TRIGLYCERIDES: 95 mg/dL (ref <150)
VLDL: 19 mg/dL (ref 0–40)

## 2013-07-15 LAB — COMPLETE METABOLIC PANEL WITH GFR
ALBUMIN: 4 g/dL (ref 3.5–5.2)
ALK PHOS: 83 U/L (ref 39–117)
ALT: 17 U/L (ref 0–53)
AST: 16 U/L (ref 0–37)
BILIRUBIN TOTAL: 0.8 mg/dL (ref 0.2–1.2)
BUN: 8 mg/dL (ref 6–23)
CO2: 31 mEq/L (ref 19–32)
Calcium: 9 mg/dL (ref 8.4–10.5)
Chloride: 101 mEq/L (ref 96–112)
Creat: 0.88 mg/dL (ref 0.50–1.35)
GFR, Est African American: >89 mL/min
Glucose, Bld: 94 mg/dL (ref 70–99)
Potassium: 4.5 mEq/L (ref 3.5–5.3)
SODIUM: 138 meq/L (ref 135–145)
TOTAL PROTEIN: 6.9 g/dL (ref 6.0–8.3)

## 2013-07-15 LAB — TSH: TSH: 0.778 u[IU]/mL (ref 0.350–4.500)

## 2013-07-15 MED ORDER — NICOTINE 21 MG/24HR TD PT24: 21.0000 mg | MEDICATED_PATCH | Freq: Every day | TRANSDERMAL | Status: DC

## 2013-07-15 MED ORDER — FLUTICASONE-SALMETEROL 100-50 MCG/DOSE IN AEPB: 1.0000 | INHALATION_SPRAY | Freq: Two times a day (BID) | RESPIRATORY_TRACT | Status: DC

## 2013-07-15 NOTE — Progress Notes (Signed)
Patient Demographics  [name], is a 58 y.o. male  KDX:[id number]  ZJQ:[id number]  DOB - 09-04-1955  CC:  Chief Complaint  Patient presents with  . Establish Care       HPI: [name] is a 58 y.o. male here today to establish medical care. He has to of COPD for 3-4 years, patient currently uses albuterol when necessary and is requiring to use it 3 or 4 times a day, as per patient in the past she tried maintenance medication inhaler, patient does smoke cigarettes, I have advised patient to quit smoking, patient is ready to try nicotine patch her, denies any fever chills chest and shortness of breath.  Patient has No headache, No chest pain, No abdominal pain - No Nausea, No new weakness tingling or numbness, No Cough - SOB.  Allergies  Allergen Reactions  . Codeine Itching  . Penicillins Itching   Past Medical History  Diagnosis Date  . COPD (chronic obstructive pulmonary disease)   . Asthma    Current Outpatient Prescriptions on File Prior to Visit  Medication Sig Dispense Refill  . albuterol (PROVENTIL HFA;VENTOLIN HFA) 108 (90 BASE) MCG/ACT inhaler Inhale 2 puffs into the lungs every 4 (four) hours as needed for wheezing.  1 Inhaler  2  . albuterol (PROVENTIL HFA;VENTOLIN HFA) 108 (90 BASE) MCG/ACT inhaler Inhale 2 puffs into the lungs every 4 (four) hours as needed for wheezing or shortness of breath.  1 Inhaler  3  . azithromycin (ZITHROMAX Z-PAK) 250 MG tablet Take 1 tablet (250 mg total) by mouth daily. 500mg  PO day 1, then 250mg  PO days 205  6 tablet  0  . predniSONE (DELTASONE) 20 MG tablet Take 2 tablets (40 mg total) by mouth daily.  10 tablet  0   No current facility-administered medications on file prior to visit.   Family History  Problem Relation Age of Onset  . Hypertension Paternal Uncle   . Cancer Maternal Grandmother   . Hypertension Maternal Grandfather    History   Social History  . Marital Status: Married    Spouse Name: N/A   Number of Children: N/A  . Years of Education: N/A   Occupational History  . Not on file.   Social History Main Topics  . Smoking status: Current Some Day Smoker -- 2.00 packs/day for 39 years    Types: Cigarettes  . Smokeless tobacco: Never Used  . Alcohol Use: Yes     Comment: "every once in awhile"  . Drug Use: No     Comment: occasinally   . Sexual Activity: Not on file   Other Topics Concern  . Not on file   Social History Narrative  . No narrative on file    Review of Systems: Constitutional: Negative for fever, chills, diaphoresis, activity change, appetite change and fatigue. HENT: Negative for ear pain, nosebleeds, congestion, facial swelling, rhinorrhea, neck pain, neck stiffness and ear discharge.  Eyes: Negative for pain, discharge, redness, itching and visual disturbance. Respiratory: Negative for cough, choking, chest tightness, shortness of breath, wheezing and stridor.  Cardiovascular: Negative for chest pain, palpitations and leg swelling. Gastrointestinal: Negative for abdominal distention. Genitourinary: Negative for dysuria, urgency, frequency, hematuria, flank pain, decreased urine volume, difficulty urinating and dyspareunia.  Musculoskeletal: Negative for back pain, joint swelling, arthralgia and gait problem. Neurological: Negative for dizziness, tremors, seizures, syncope, facial asymmetry, speech difficulty, weakness, light-headedness, numbness and headaches.  Hematological: Negative for adenopathy. Does not bruise/bleed easily. Psychiatric/Behavioral: Negative for  hallucinations, behavioral problems, confusion, dysphoric mood, decreased concentration and agitation.    Objective:   Filed Vitals:   07/15/13 1146  BP: 150/80  Pulse:   Temp:   Resp:     Physical Exam: Constitutional: Patient appears well-developed and well-nourished. No distress. HENT: Normocephalic, atraumatic, External right and left ear normal. Oropharynx is clear and moist.    Eyes: Conjunctivae and EOM are normal. PERRLA, no scleral icterus. Neck: Normal ROM. Neck supple. No JVD. No tracheal deviation. No thyromegaly. CVS: RRR, S1/S2 +, no murmurs, no gallops, no carotid bruit.  Pulmonary: Effort and breath sounds normal, no stridor, rhonchi, +wheezes, rales.  Abdominal: Soft. BS +, no distension, tenderness, rebound or guarding.  Musculoskeletal: Normal range of motion. No edema and no tenderness.  Neuro: Alert. Normal reflexes, muscle tone coordination. No cranial nerve deficit. Skin: Skin is warm and dry. No rash noted. Not diaphoretic. No erythema. No pallor. Psychiatric: Normal mood and affect. Behavior, judgment, thought content normal.  Lab Results  Component Value Date   WBC 4.4 03/22/2013   HGB 15.3 03/22/2013   HCT 44.8 03/22/2013   MCV 92.6 03/22/2013   PLT 165 03/22/2013   Lab Results  Component Value Date   CREATININE 1.05 03/22/2013   BUN 15 03/22/2013   NA 140 03/22/2013   K 4.2 03/22/2013   CL 102 03/22/2013   CO2 30 03/22/2013    No results found for this basename: HGBA1C   Lipid Panel  No results found for this basename: chol, trig, hdl, cholhdl, vldl, ldlcalc       Assessment and plan:   1. Tobacco abuse  - nicotine (NICODERM CQ) 21 mg/24hr patch; Place 1 patch (21 mg total) onto the skin daily.  Dispense: 28 patch; Refill: 0  2. COPD mixed type Have started patient on Advair, he will use albuterol when necessary. - Fluticasone-Salmeterol (ADVAIR) 100-50 MCG/DOSE AEPB; Inhale 1 puff into the lungs 2 (two) times daily.  Dispense: 1 each; Refill: 3  3. Screening Baseline fasting blood work. - CBC with Differential - COMPLETE METABOLIC PANEL WITH GFR - TSH - Lipid panel - Vit D  25 hydroxy (rtn osteoporosis monitoring)  4. Special screening for malignant neoplasms, colon  - Ambulatory referral to Gastroenterology  5. Elevated BP Repeat manual blood pressure is 150/80, I have advised patient for DASH diet. Evaluate on next visit if  persistently elevated consider starting on Norvasc.      Health Maintenance -Colonoscopy: referred to GI   Return in about 3 months (around 10/15/2013) for COPD.      Daniel Marek, MD

## 2013-07-15 NOTE — Patient Instructions (Signed)
DASH Eating Plan  DASH stands for "Dietary Approaches to Stop Hypertension." The DASH eating plan is a healthy eating plan that has been shown to reduce high blood pressure (hypertension). Additional health benefits may include reducing the risk of type 2 diabetes mellitus, heart disease, and stroke. The DASH eating plan may also help with weight loss.  WHAT DO I NEED TO KNOW ABOUT THE DASH EATING PLAN?  For the DASH eating plan, you will follow these general guidelines:  · Choose foods with a percent daily value for sodium of less than 5% (as listed on the food label).  · Use salt-free seasonings or herbs instead of table salt or sea salt.  · Check with your health care provider or pharmacist before using salt substitutes.  · Eat lower-sodium products, often labeled as "lower sodium" or "no salt added."  · Eat fresh foods.  · Eat more vegetables, fruits, and low-fat dairy products.  · Choose whole grains. Look for the word "whole" as the first word in the ingredient list.  · Choose fish and skinless chicken or turkey more often than red meat. Limit fish, poultry, and meat to 6 oz (170 g) each day.  · Limit sweets, desserts, sugars, and sugary drinks.  · Choose heart-healthy fats.  · Limit cheese to 1 oz (28 g) per day.  · Eat more home-cooked food and less restaurant, buffet, and fast food.  · Limit fried foods.  · Cook foods using methods other than frying.  · Limit canned vegetables. If you do use them, rinse them well to decrease the sodium.  · When eating at a restaurant, ask that your food be prepared with less salt, or no salt if possible.  WHAT FOODS CAN I EAT?  Seek help from a dietitian for individual calorie needs.  Grains  Whole grain or whole wheat bread. Brown rice. Whole grain or whole wheat pasta. Quinoa, bulgur, and whole grain cereals. Low-sodium cereals. Corn or whole wheat flour tortillas. Whole grain cornbread. Whole grain crackers. Low-sodium crackers.  Vegetables  Fresh or frozen vegetables  (raw, steamed, roasted, or grilled). Low-sodium or reduced-sodium tomato and vegetable juices. Low-sodium or reduced-sodium tomato sauce and paste. Low-sodium or reduced-sodium canned vegetables.   Fruits  All fresh, canned (in natural juice), or frozen fruits.  Meat and Other Protein Products  Ground beef (85% or leaner), grass-fed beef, or beef trimmed of fat. Skinless chicken or turkey. Ground chicken or turkey. Pork trimmed of fat. All fish and seafood. Eggs. Dried beans, peas, or lentils. Unsalted nuts and seeds. Unsalted canned beans.  Dairy  Low-fat dairy products, such as skim or 1% milk, 2% or reduced-fat cheeses, low-fat ricotta or cottage cheese, or plain low-fat yogurt. Low-sodium or reduced-sodium cheeses.  Fats and Oils  Tub margarines without trans fats. Light or reduced-fat mayonnaise and salad dressings (reduced sodium). Avocado. Safflower, olive, or canola oils. Natural peanut or almond butter.  Other  Unsalted popcorn and pretzels.  The items listed above may not be a complete list of recommended foods or beverages. Contact your dietitian for more options.  WHAT FOODS ARE NOT RECOMMENDED?  Grains  White bread. White pasta. White rice. Refined cornbread. Bagels and croissants. Crackers that contain trans fat.  Vegetables  Creamed or fried vegetables. Vegetables in a cheese sauce. Regular canned vegetables. Regular canned tomato sauce and paste. Regular tomato and vegetable juices.  Fruits  Dried fruits. Canned fruit in light or heavy syrup. Fruit juice.  Meat and Other Protein   Products  Fatty cuts of meat. Ribs, chicken wings, bacon, sausage, bologna, salami, chitterlings, fatback, hot dogs, bratwurst, and packaged luncheon meats. Salted nuts and seeds. Canned beans with salt.  Dairy  Whole or 2% milk, cream, half-and-half, and cream cheese. Whole-fat or sweetened yogurt. Full-fat cheeses or blue cheese. Nondairy creamers and whipped toppings. Processed cheese, cheese spreads, or cheese  curds.  Condiments  Onion and garlic salt, seasoned salt, table salt, and sea salt. Canned and packaged gravies. Worcestershire sauce. Tartar sauce. Barbecue sauce. Teriyaki sauce. Soy sauce, including reduced sodium. Steak sauce. Fish sauce. Oyster sauce. Cocktail sauce. Horseradish. Ketchup and mustard. Meat flavorings and tenderizers. Bouillon cubes. Hot sauce. Tabasco sauce. Marinades. Taco seasonings. Relishes.  Fats and Oils  Butter, stick margarine, lard, shortening, ghee, and bacon fat. Coconut, palm kernel, or palm oils. Regular salad dressings.  Other  Pickles and olives. Salted popcorn and pretzels.  The items listed above may not be a complete list of foods and beverages to avoid. Contact your dietitian for more information.  WHERE CAN I FIND MORE INFORMATION?  National Heart, [employer], [employer] Institute: www.nhlbi.nih.gov/health/health-topics/topics/dash/  Document Released: 12/20/2010 Document Revised: 01/05/2013 Document Reviewed: 11/04/2012  Exit[employer]® Patient Information ©2015 [employer]. This information is not intended to replace advice given to you by your health care provider. Make sure you discuss any questions you have with your health care provider.

## 2013-07-15 NOTE — Progress Notes (Signed)
Patient here to establish care Presents today with elevated blood pressure Patient did state he smoked a cigarette before his appointment

## 2013-07-16 LAB — VITAMIN D 25 HYDROXY (VIT D DEFICIENCY, FRACTURES): Vit D, 25-Hydroxy: 23 ng/mL — ABNORMAL LOW (ref 30–89)

## 2013-07-19 ENCOUNTER — Telehealth: Payer: Self-pay

## 2013-07-19 DIAGNOSIS — R030 Elevated blood-pressure reading, without diagnosis of hypertension: Secondary | ICD-10-CM

## 2013-07-19 DIAGNOSIS — IMO0001 Reserved for inherently not codable concepts without codable children: Secondary | ICD-10-CM | POA: Insufficient documentation

## 2013-07-19 MED ORDER — VITAMIN D (ERGOCALCIFEROL) 1.25 MG (50000 UNIT) PO CAPS: 50000.0000 [IU] | ORAL_CAPSULE | ORAL | Status: DC

## 2013-07-19 NOTE — Telephone Encounter (Signed)
Spoke with patient mom She is aware of his lab results and will let Her son know to pick up his prescription

## 2013-07-19 NOTE — Telephone Encounter (Signed)
Message copied by Dorothe Pea on Mon Jul 19, 2013  9:42 AM ------      Message from: Lorayne Marek      Created: Mon Jul 19, 2013  9:17 AM       Blood work reviewed, noticed low vitamin D, call patient advise to start ergocalciferol 50,000 units once a week for the duration of  12 weeks.       ------

## 2013-07-21 ENCOUNTER — Ambulatory Visit: Payer: No Typology Code available for payment source | Attending: Internal Medicine

## 2013-08-16 ENCOUNTER — Encounter: Payer: Self-pay | Admitting: Internal Medicine

## 2013-08-30 ENCOUNTER — Emergency Department (HOSPITAL_COMMUNITY)
Admission: EM | Admit: 2013-08-30 | Discharge: 2013-08-30 | Disposition: A | Discharge status: Home / Self Care | Payer: No Typology Code available for payment source | Attending: Emergency Medicine | Admitting: Emergency Medicine

## 2013-08-30 ENCOUNTER — Encounter (HOSPITAL_COMMUNITY): Payer: Self-pay | Admitting: Emergency Medicine

## 2013-08-30 ENCOUNTER — Emergency Department (HOSPITAL_COMMUNITY): Payer: No Typology Code available for payment source

## 2013-08-30 DIAGNOSIS — F17200 Nicotine dependence, unspecified, uncomplicated: Secondary | ICD-10-CM | POA: Insufficient documentation

## 2013-08-30 DIAGNOSIS — Z880 Allergy status to penicillin: Secondary | ICD-10-CM | POA: Insufficient documentation

## 2013-08-30 DIAGNOSIS — IMO0002 Reserved for concepts with insufficient information to code with codable children: Secondary | ICD-10-CM | POA: Insufficient documentation

## 2013-08-30 DIAGNOSIS — Z79899 Other long term (current) drug therapy: Secondary | ICD-10-CM | POA: Insufficient documentation

## 2013-08-30 DIAGNOSIS — Z792 Long term (current) use of antibiotics: Secondary | ICD-10-CM | POA: Insufficient documentation

## 2013-08-30 DIAGNOSIS — J441 Chronic obstructive pulmonary disease with (acute) exacerbation: Secondary | ICD-10-CM | POA: Insufficient documentation

## 2013-08-30 DIAGNOSIS — J45901 Unspecified asthma with (acute) exacerbation: Principal | ICD-10-CM

## 2013-08-30 DIAGNOSIS — R0602 Shortness of breath: Secondary | ICD-10-CM | POA: Insufficient documentation

## 2013-08-30 LAB — CBC
HCT: 43.8 % (ref 39.0–52.0)
HEMOGLOBIN: 15 g/dL (ref 13.0–17.0)
MCH: 32.5 pg (ref 26.0–34.0)
MCHC: 34.2 g/dL (ref 30.0–36.0)
MCV: 94.8 fL (ref 78.0–100.0)
PLATELETS: 144 10*3/uL — AB (ref 150–400)
RBC: 4.62 MIL/uL (ref 4.22–5.81)
RDW: 14.3 % (ref 11.5–15.5)
WBC: 3.6 10*3/uL — ABNORMAL LOW (ref 4.0–10.5)

## 2013-08-30 LAB — BASIC METABOLIC PANEL
ANION GAP: 10 (ref 5–15)
BUN: 15 mg/dL (ref 6–23)
CALCIUM: 9.2 mg/dL (ref 8.4–10.5)
CHLORIDE: 104 meq/L (ref 96–112)
CO2: 26 mEq/L (ref 19–32)
Creatinine, Ser: 0.94 mg/dL (ref 0.50–1.35)
GFR calc Af Amer: >90 mL/min (ref >90)
Glucose, Bld: 94 mg/dL (ref 70–99)
Potassium: 4.2 mEq/L (ref 3.7–5.3)
Sodium: 140 mEq/L (ref 137–147)

## 2013-08-30 LAB — I-STAT TROPONIN, ED: TROPONIN I, POC: 0 ng/mL (ref 0.00–0.08)

## 2013-08-30 MED ORDER — IPRATROPIUM-ALBUTEROL 0.5-2.5 (3) MG/3ML IN SOLN
3.0000 mL | Freq: Once | RESPIRATORY_TRACT | Status: AC
  Administered 2013-08-30: 3 mL via RESPIRATORY_TRACT
  Filled 2013-08-30: qty 3

## 2013-08-30 MED ORDER — PREDNISONE 20 MG PO TABS
60.0000 mg | ORAL_TABLET | Freq: Once | ORAL | Status: AC
  Administered 2013-08-30: 60 mg via ORAL
  Filled 2013-08-30: qty 3

## 2013-08-30 MED ORDER — PREDNISONE 20 MG PO TABS: 40.0000 mg | ORAL_TABLET | Freq: Every day | ORAL | Status: DC

## 2013-08-30 MED ORDER — ALBUTEROL (5 MG/ML) CONTINUOUS INHALATION SOLN
10.0000 mg/h | INHALATION_SOLUTION | RESPIRATORY_TRACT | Status: AC
  Administered 2013-08-30: 10 mg/h via RESPIRATORY_TRACT
  Filled 2013-08-30: qty 20

## 2013-08-30 MED ORDER — ALBUTEROL SULFATE (2.5 MG/3ML) 0.083% IN NEBU
2.5000 mg | INHALATION_SOLUTION | Freq: Once | RESPIRATORY_TRACT | Status: AC
  Administered 2013-08-30: 2.5 mg via RESPIRATORY_TRACT
  Filled 2013-08-30: qty 3

## 2013-08-30 NOTE — ED Provider Notes (Signed)
CSN: [id number]     Arrival date & time 08/30/13  1045 History   First MD Initiated Contact with Patient 08/30/13 1103     Chief Complaint  Patient presents with  . Shortness of Breath     (Consider location/radiation/quality/duration/timing/severity/associated sxs/prior Treatment) Patient is a 58 y.o. male presenting with shortness of breath. The history is provided by the patient. No language interpreter was used.  Shortness of Breath Severity:  Moderate Onset quality:  Gradual Duration:  3 days Timing:  Constant Progression:  Unchanged Chronicity:  New Context: not activity, not animal exposure, not emotional upset, not known allergens, not pollens, not smoke exposure, not strong odors, not URI and not weather changes   Context comment:  Patient has a history of COPD Relieved by:  Nothing Worsened by:  Nothing tried Ineffective treatments:  None tried Associated symptoms: no abdominal pain, no chest pain, no fever, no neck pain and no vomiting   Risk factors: tobacco use   Risk factors: no recent alcohol use, no family hx of DVT, no hx of cancer, no hx of PE/DVT, no obesity, no oral contraceptive use, no prolonged immobilization and no recent surgery     Past Medical History  Diagnosis Date  . COPD (chronic obstructive pulmonary disease)   . Asthma    Past Surgical History  Procedure Laterality Date  . Hand surgery     Family History  Problem Relation Age of Onset  . Hypertension Paternal Uncle   . Cancer Maternal Grandmother   . Hypertension Maternal Grandfather    History  Substance Use Topics  . Smoking status: Current Some Day Smoker -- 2.00 packs/day for 39 years    Types: Cigarettes  . Smokeless tobacco: Never Used  . Alcohol Use: Yes     Comment: "every once in awhile"    Review of Systems  Constitutional: Negative for fever, chills and fatigue.  HENT: Negative for trouble swallowing.   Eyes: Negative for visual disturbance.  Respiratory: Positive for  shortness of breath.   Cardiovascular: Negative for chest pain and palpitations.  Gastrointestinal: Negative for nausea, vomiting, abdominal pain and diarrhea.  Genitourinary: Negative for dysuria and difficulty urinating.  Musculoskeletal: Negative for arthralgias and neck pain.  Skin: Negative for color change.  Neurological: Negative for dizziness and weakness.  Psychiatric/Behavioral: Negative for dysphoric mood.      Allergies  Codeine and Penicillins  Home Medications   Prior to Admission medications   Medication Sig Start Date End Date Taking? Authorizing Provider  albuterol (PROVENTIL HFA;VENTOLIN HFA) 108 (90 BASE) MCG/ACT inhaler Inhale 2 puffs into the lungs every 4 (four) hours as needed for wheezing. 02/06/12   Geradine Girt, DO  albuterol (PROVENTIL HFA;VENTOLIN HFA) 108 (90 BASE) MCG/ACT inhaler Inhale 2 puffs into the lungs every 4 (four) hours as needed for wheezing or shortness of breath. 05/12/13   Montine Circle, PA-C  azithromycin (ZITHROMAX Z-PAK) 250 MG tablet Take 1 tablet (250 mg total) by mouth daily. 500mg  PO day 1, then 250mg  PO days 205 05/12/13   Montine Circle, PA-C  Fluticasone-Salmeterol (ADVAIR) 100-50 MCG/DOSE AEPB Inhale 1 puff into the lungs 2 (two) times daily. 07/15/13   Lorayne Marek, MD  nicotine (NICODERM CQ) 21 mg/24hr patch Place 1 patch (21 mg total) onto the skin daily. 07/15/13   Lorayne Marek, MD  predniSONE (DELTASONE) 20 MG tablet Take 2 tablets (40 mg total) by mouth daily. 05/12/13   Montine Circle, PA-C  Vitamin D, Ergocalciferol, (DRISDOL) 50000 UNITS  CAPS capsule Take 1 capsule (50,000 Units total) by mouth every 7 (seven) days. 07/19/13   Lorayne Marek, MD   BP 162/98  Pulse 100  Temp(Src) 98 F (36.7 C) (Oral)  Resp 22  SpO2 97% Physical Exam  Nursing note and vitals reviewed. Constitutional: He is oriented to person, place, and time. He appears well-developed and well-nourished. No distress.  HENT:  Head: Normocephalic and  atraumatic.  Eyes: Conjunctivae and EOM are normal.  Neck: Normal range of motion.  Cardiovascular: Normal rate and regular rhythm.  Exam reveals no gallop and no friction rub.   No murmur heard. Pulmonary/Chest: Effort normal. He has wheezes. He has no rales. He exhibits no tenderness.  Wheezing in all lung fields.   Abdominal: Soft. He exhibits no distension. There is no tenderness. There is no rebound and no guarding.  Musculoskeletal: Normal range of motion.  Neurological: He is alert and oriented to person, place, and time. Coordination normal.  Speech is goal-oriented. Moves limbs without ataxia.   Skin: Skin is warm and dry.  Psychiatric: He has a normal mood and affect. His behavior is normal.    ED Course  Procedures (including critical care time) Labs Review Labs Reviewed  CBC - Abnormal; Notable for the following:    WBC 3.6 (*)    Platelets 144 (*)    All other components within normal limits  BASIC METABOLIC PANEL  Randolm Idol, ED    Imaging Review Dg Chest 2 View  08/30/2013   CLINICAL DATA:  Shortness of breath for 3 days. History of asthma and smoking.  EXAM: CHEST  2 VIEW  COMPARISON:  05/12/2013.  FINDINGS: Normal cardiac and mediastinal silhouette. Clear lung fields. Degenerative change thoracic spine. Similar appearance to priors. Moderate hyperinflation consistent with COPD.  IMPRESSION: No active cardiopulmonary disease.   Electronically Signed   By: Rolla Flatten M.D.   On: 08/30/2013 11:55     EKG Interpretation   Date/Time:  Monday August 30 2013 11:19:06 EDT Ventricular Rate:  90 PR Interval:  137 QRS Duration: 78 QT Interval:  361 QTC Calculation: 442 R Axis:   77 Text Interpretation:  Sinus rhythm Anteroseptal infarct, old Baseline  wander in lead(s) II aVF No significant change since last tracing  Confirmed by BEATON  MD, ROBERT ([id number]) on 08/30/2013 11:23:38 AM      MDM   Final diagnoses:  COPD exacerbation    11:04 AM Chest xray  and EKG pending. Patient will have nebulizer treatment.    1:49 PM Chest xray and EKG pending. Labs unremarkable. Lung sounds improved after continuous nebulizer and prednisone PO. Patient will be discharged with prednisone.   Alvina Chou, PA-C 08/30/13 1354

## 2013-08-30 NOTE — ED Notes (Signed)
Bed: WA16 Expected date:  Expected time:  Means of arrival:  Comments: 

## 2013-08-30 NOTE — ED Notes (Signed)
Pt has COPD, pt c/o SOB x 72 hours pt laughing and talking in triage , pt NAD

## 2013-08-30 NOTE — ED Notes (Signed)
Patient explained at length that he needs to follow with his PMD for further treatment of the COPD. The patient was wanting further medications to manage his COPD, and reports "well if you don't give them to me then I will just be back here next week" I went at length into the fact that the dr wants him to follow up with his PMD how to call and when to be seen. Patient just negative and shook his head.

## 2013-08-30 NOTE — Discharge Instructions (Signed)
Take Prednisone as directed until gone. Refer to attached documents for more information.

## 2013-08-30 NOTE — ED Provider Notes (Signed)
Medical screening examination/treatment/procedure(s) were performed by non-physician practitioner and as supervising physician I was immediately available for consultation/collaboration.   Dot Lanes, MD 08/30/13 2206

## 2013-09-03 ENCOUNTER — Ambulatory Visit: Payer: No Typology Code available for payment source | Attending: Internal Medicine | Admitting: Internal Medicine

## 2013-09-03 ENCOUNTER — Encounter: Payer: Self-pay | Admitting: Internal Medicine

## 2013-09-03 VITALS — BP 153/107 | HR 99 | Temp 98.0°F | Resp 16 | Ht 69.0 in | Wt 239.0 lb

## 2013-09-03 DIAGNOSIS — Z720 Tobacco use: Secondary | ICD-10-CM

## 2013-09-03 DIAGNOSIS — IMO0001 Reserved for inherently not codable concepts without codable children: Secondary | ICD-10-CM

## 2013-09-03 DIAGNOSIS — R221 Localized swelling, mass and lump, neck: Secondary | ICD-10-CM

## 2013-09-03 DIAGNOSIS — R030 Elevated blood-pressure reading, without diagnosis of hypertension: Secondary | ICD-10-CM | POA: Insufficient documentation

## 2013-09-03 DIAGNOSIS — F17200 Nicotine dependence, unspecified, uncomplicated: Secondary | ICD-10-CM | POA: Insufficient documentation

## 2013-09-03 DIAGNOSIS — R220 Localized swelling, mass and lump, head: Secondary | ICD-10-CM | POA: Insufficient documentation

## 2013-09-03 DIAGNOSIS — E559 Vitamin D deficiency, unspecified: Secondary | ICD-10-CM | POA: Insufficient documentation

## 2013-09-03 DIAGNOSIS — J449 Chronic obstructive pulmonary disease, unspecified: Secondary | ICD-10-CM

## 2013-09-03 DIAGNOSIS — J441 Chronic obstructive pulmonary disease with (acute) exacerbation: Secondary | ICD-10-CM | POA: Insufficient documentation

## 2013-09-03 MED ORDER — VITAMIN D (ERGOCALCIFEROL) 1.25 MG (50000 UNIT) PO CAPS: 50000.0000 [IU] | ORAL_CAPSULE | ORAL | Status: DC

## 2013-09-03 MED ORDER — FLUTICASONE-SALMETEROL 100-50 MCG/DOSE IN AEPB: 1.0000 | INHALATION_SPRAY | Freq: Two times a day (BID) | RESPIRATORY_TRACT | Status: DC

## 2013-09-03 MED ORDER — ALBUTEROL SULFATE HFA 108 (90 BASE) MCG/ACT IN AERS: 2.0000 | INHALATION_SPRAY | RESPIRATORY_TRACT | Status: DC | PRN

## 2013-09-03 NOTE — Progress Notes (Signed)
Pt is here following up on his COPD. Pt states that for a 4 weeks his neck has been in pain.

## 2013-09-03 NOTE — Patient Instructions (Signed)
DASH Eating Plan °DASH stands for "Dietary Approaches to Stop Hypertension." The DASH eating plan is a healthy eating plan that has been shown to reduce high blood pressure (hypertension). Additional health benefits may include reducing the risk of type 2 diabetes mellitus, heart disease, and stroke. The DASH eating plan may also help with weight loss. °WHAT DO I NEED TO KNOW ABOUT THE DASH EATING PLAN? °For the DASH eating plan, you will follow these general guidelines: °· Choose foods with a percent daily value for sodium of less than 5% (as listed on the food label). °· Use salt-free seasonings or herbs instead of table salt or sea salt. °· Check with your health care provider or pharmacist before using salt substitutes. °· Eat lower-sodium products, often labeled as "lower sodium" or "no salt added." °· Eat fresh foods. °· Eat more vegetables, fruits, and low-fat dairy products. °· Choose whole grains. Look for the word "whole" as the first word in the ingredient list. °· Choose fish and skinless chicken or turkey more often than red meat. Limit fish, poultry, and meat to 6 oz (170 g) each day. °· Limit sweets, desserts, sugars, and sugary drinks. °· Choose heart-healthy fats. °· Limit cheese to 1 oz (28 g) per day. °· Eat more home-cooked food and less restaurant, buffet, and fast food. °· Limit fried foods. °· Cook foods using methods other than frying. °· Limit canned vegetables. If you do use them, rinse them well to decrease the sodium. °· When eating at a restaurant, ask that your food be prepared with less salt, or no salt if possible. °WHAT FOODS CAN I EAT? °Seek help from a dietitian for individual calorie needs. °Grains °Whole grain or whole wheat bread. Brown rice. Whole grain or whole wheat pasta. Quinoa, bulgur, and whole grain cereals. Low-sodium cereals. Corn or whole wheat flour tortillas. Whole grain cornbread. Whole grain crackers. Low-sodium crackers. °Vegetables °Fresh or frozen vegetables  (raw, steamed, roasted, or grilled). Low-sodium or reduced-sodium tomato and vegetable juices. Low-sodium or reduced-sodium tomato sauce and paste. Low-sodium or reduced-sodium canned vegetables.  °Fruits °All fresh, canned (in natural juice), or frozen fruits. °Meat and Other Protein Products °Ground beef (85% or leaner), grass-fed beef, or beef trimmed of fat. Skinless chicken or turkey. Ground chicken or turkey. Pork trimmed of fat. All fish and seafood. Eggs. Dried beans, peas, or lentils. Unsalted nuts and seeds. Unsalted canned beans. °Dairy °Low-fat dairy products, such as skim or 1% milk, 2% or reduced-fat cheeses, low-fat ricotta or cottage cheese, or plain low-fat yogurt. Low-sodium or reduced-sodium cheeses. °Fats and Oils °Tub margarines without trans fats. Light or reduced-fat mayonnaise and salad dressings (reduced sodium). Avocado. Safflower, olive, or canola oils. Natural peanut or almond butter. °Other °Unsalted popcorn and pretzels. °The items listed above may not be a complete list of recommended foods or beverages. Contact your dietitian for more options. °WHAT FOODS ARE NOT RECOMMENDED? °Grains °White bread. White pasta. White rice. Refined cornbread. Bagels and croissants. Crackers that contain trans fat. °Vegetables °Creamed or fried vegetables. Vegetables in a cheese sauce. Regular canned vegetables. Regular canned tomato sauce and paste. Regular tomato and vegetable juices. °Fruits °Dried fruits. Canned fruit in light or heavy syrup. Fruit juice. °Meat and Other Protein Products °Fatty cuts of meat. Ribs, chicken wings, bacon, sausage, bologna, salami, chitterlings, fatback, hot dogs, bratwurst, and packaged luncheon meats. Salted nuts and seeds. Canned beans with salt. °Dairy °Whole or 2% milk, cream, half-and-half, and cream cheese. Whole-fat or sweetened yogurt. Full-fat   cheeses or blue cheese. Nondairy creamers and whipped toppings. Processed cheese, cheese spreads, or cheese  curds. °Condiments °Onion and garlic salt, seasoned salt, table salt, and sea salt. Canned and packaged gravies. Worcestershire sauce. Tartar sauce. Barbecue sauce. Teriyaki sauce. Soy sauce, including reduced sodium. Steak sauce. Fish sauce. Oyster sauce. Cocktail sauce. Horseradish. Ketchup and mustard. Meat flavorings and tenderizers. Bouillon cubes. Hot sauce. Tabasco sauce. Marinades. Taco seasonings. Relishes. °Fats and Oils °Butter, stick margarine, lard, shortening, ghee, and bacon fat. Coconut, palm kernel, or palm oils. Regular salad dressings. °Other °Pickles and olives. Salted popcorn and pretzels. °The items listed above may not be a complete list of foods and beverages to avoid. Contact your dietitian for more information. °WHERE CAN I FIND MORE INFORMATION? °[employer], [employer], [employer]: www.nhlbi.nih.gov/health/health-topics/topics/dash/ °Document Released: 12/20/2010 Document Revised: 05/17/2013 Document Reviewed: 11/04/2012 °Exit[employer]® Patient Information ©2015 [employer]. This information is not intended to replace advice given to you by your health care provider. Make sure you discuss any questions you have with your health care provider. ° °

## 2013-09-03 NOTE — Progress Notes (Signed)
MRN: [id number] Name: Nikolaos Maddocks  Sex: male Age: 58 y.o. DOB: [birth year]  Allergies: Codeine and Penicillins  Chief Complaint  Patient presents with  . Follow-up    HPI: Patient is 58 y.o. male who comes today for followup, recently went to the emergency room was treated for COPD exacerbation, patient was prescribed albuterol and Advair in the past as per patient he could not afford it and was not using it, currently he has albuterol inhaler which helps him with the symptoms, he also had a blood work done which was reviewed with the patient noticed vitamin D deficiency, patient is to smoke cigarettes, I have counseled patient to quit smoking, his blood pressure is still elevated I have discussed about taking medication, today is reluctant and wants to modify his diet and is going to work on quitting smoking. Patient also reported to have noticed lump on back of neck which is nontender denies any fever or any discharge, has been persistently there for last 5 years no change in size. Past Medical History  Diagnosis Date  . COPD (chronic obstructive pulmonary disease)   . Asthma     Past Surgical History  Procedure Laterality Date  . Hand surgery        Medication List       This list is accurate as of: 09/03/13 12:50 PM.  Always use your most recent med list.               albuterol 108 (90 BASE) MCG/ACT inhaler  Commonly known as:  PROVENTIL HFA;VENTOLIN HFA  Inhale 2 puffs into the lungs every 4 (four) hours as needed for wheezing or shortness of breath.     Fluticasone-Salmeterol 100-50 MCG/DOSE Aepb  Commonly known as:  ADVAIR  Inhale 1 puff into the lungs 2 (two) times daily.     nicotine 21 mg/24hr patch  Commonly known as:  NICODERM CQ  Place 1 patch (21 mg total) onto the skin daily.     predniSONE 20 MG tablet  Commonly known as:  DELTASONE  Take 2 tablets (40 mg total) by mouth daily.     Vitamin D (Ergocalciferol) 50000 UNITS Caps capsule    Commonly known as:  DRISDOL  Take 1 capsule (50,000 Units total) by mouth every 7 (seven) days.        Meds ordered this encounter  Medications  . albuterol (PROVENTIL HFA;VENTOLIN HFA) 108 (90 BASE) MCG/ACT inhaler    Sig: Inhale 2 puffs into the lungs every 4 (four) hours as needed for wheezing or shortness of breath.    Dispense:  1 Inhaler    Refill:  3  . Fluticasone-Salmeterol (ADVAIR) 100-50 MCG/DOSE AEPB    Sig: Inhale 1 puff into the lungs 2 (two) times daily.    Dispense:  1 each    Refill:  3  . Vitamin D, Ergocalciferol, (DRISDOL) 50000 UNITS CAPS capsule    Sig: Take 1 capsule (50,000 Units total) by mouth every 7 (seven) days.    Dispense:  12 capsule    Refill:  0     There is no immunization history on file for this patient.  Family History  Problem Relation Age of Onset  . Hypertension Paternal Uncle   . Cancer Maternal Grandmother   . Hypertension Maternal Grandfather     History  Substance Use Topics  . Smoking status: Current Some Day Smoker -- 2.00 packs/day for 39 years    Types: Cigarettes  . Smokeless  tobacco: Never Used  . Alcohol Use: Yes     Comment: "every once in awhile"    Review of Systems   As noted in HPI  Filed Vitals:   09/03/13 1134  BP: 153/107  Pulse: 99  Temp: 98 F (36.7 C)  Resp: 16    Physical Exam  Physical Exam  Constitutional: No distress.  Eyes: EOM are normal. Pupils are equal, round, and reactive to light.  Neck:  Back of neck 3 palpable lumps under the skin nontender mobile  Cardiovascular: Normal rate and regular rhythm.   Pulmonary/Chest: Breath sounds normal. No respiratory distress. He has no wheezes. He has no rales.  Musculoskeletal: He exhibits no edema.    CBC    Component Value Date/Time   WBC 3.6* 08/30/2013 1129   RBC 4.62 08/30/2013 1129   HGB 15.0 08/30/2013 1129   HCT 43.8 08/30/2013 1129   PLT 144* 08/30/2013 1129   MCV 94.8 08/30/2013 1129   LYMPHSABS 1.6 07/15/2013 1152   MONOABS  0.3 07/15/2013 1152   EOSABS 0.1 07/15/2013 1152   BASOSABS 0.0 07/15/2013 1152    CMP     Component Value Date/Time   NA 140 08/30/2013 1129   K 4.2 08/30/2013 1129   CL 104 08/30/2013 1129   CO2 26 08/30/2013 1129   GLUCOSE 94 08/30/2013 1129   BUN 15 08/30/2013 1129   CREATININE 0.94 08/30/2013 1129   CREATININE 0.88 07/15/2013 1152   CALCIUM 9.2 08/30/2013 1129   PROT 6.9 07/15/2013 1152   ALBUMIN 4.0 07/15/2013 1152   AST 16 07/15/2013 1152   ALT 17 07/15/2013 1152   ALKPHOS 83 07/15/2013 1152   BILITOT 0.8 07/15/2013 1152   GFRNONAA >90 08/30/2013 1129   GFRNONAA >89 07/15/2013 1152   GFRAA >90 08/30/2013 1129   GFRAA >89 07/15/2013 1152    Lab Results  Component Value Date/Time   CHOL 184 07/15/2013 11:52 AM    No components found with this basename: hga1c    Lab Results  Component Value Date/Time   AST 16 07/15/2013 11:52 AM    Assessment and Plan  COPD mixed type - Plan: I have prescribed him albuterol (PROVENTIL HFA;VENTOLIN HFA) 108 (90 BASE) MCG/ACT inhaler, Fluticasone-Salmeterol (ADVAIR) 100-50 MCG/DOSE AEPB, he will take the prescription to pharmacy.  Elevated BP Advised patient for diet modification and could smoking, she already has followup if his blood pressure is elevated consider starting on blood pressure medication.  Unspecified vitamin D deficiency - Plan: Vitamin D, Ergocalciferol, (DRISDOL) 50000 UNITS CAPS capsule  Tobacco abuse Patient is going to try to smoking.  Lump in neck - Plan: Ambulatory referral to General Surgery   Patient already has scheduled appointment.  Lorayne Marek, MD

## 2013-09-06 ENCOUNTER — Ambulatory Visit: Payer: No Typology Code available for payment source | Attending: Internal Medicine

## 2013-09-14 ENCOUNTER — Other Ambulatory Visit: Payer: Self-pay

## 2013-09-14 DIAGNOSIS — J449 Chronic obstructive pulmonary disease, unspecified: Secondary | ICD-10-CM

## 2013-09-14 MED ORDER — FLUTICASONE-SALMETEROL 100-50 MCG/DOSE IN AEPB: 1.0000 | INHALATION_SPRAY | Freq: Two times a day (BID) | RESPIRATORY_TRACT | Status: DC

## 2013-09-14 MED ORDER — ALBUTEROL SULFATE HFA 108 (90 BASE) MCG/ACT IN AERS: 2.0000 | INHALATION_SPRAY | RESPIRATORY_TRACT | Status: DC | PRN

## 2013-10-15 ENCOUNTER — Ambulatory Visit: Payer: No Typology Code available for payment source | Admitting: Internal Medicine

## 2013-10-22 ENCOUNTER — Ambulatory Visit: Payer: No Typology Code available for payment source | Attending: Internal Medicine | Admitting: Internal Medicine

## 2013-10-22 ENCOUNTER — Encounter: Payer: Self-pay | Admitting: Internal Medicine

## 2013-10-22 VITALS — BP 150/80 | HR 81 | Temp 98.5°F | Resp 16 | Wt 242.4 lb

## 2013-10-22 DIAGNOSIS — J45909 Unspecified asthma, uncomplicated: Secondary | ICD-10-CM | POA: Insufficient documentation

## 2013-10-22 DIAGNOSIS — J449 Chronic obstructive pulmonary disease, unspecified: Secondary | ICD-10-CM | POA: Insufficient documentation

## 2013-10-22 DIAGNOSIS — Z7952 Long term (current) use of systemic steroids: Secondary | ICD-10-CM | POA: Insufficient documentation

## 2013-10-22 DIAGNOSIS — F17210 Nicotine dependence, cigarettes, uncomplicated: Secondary | ICD-10-CM | POA: Insufficient documentation

## 2013-10-22 DIAGNOSIS — I10 Essential (primary) hypertension: Secondary | ICD-10-CM | POA: Insufficient documentation

## 2013-10-22 DIAGNOSIS — Z720 Tobacco use: Secondary | ICD-10-CM

## 2013-10-22 DIAGNOSIS — Z79899 Other long term (current) drug therapy: Secondary | ICD-10-CM | POA: Insufficient documentation

## 2013-10-22 MED ORDER — AMLODIPINE BESYLATE 2.5 MG PO TABS: 2.5000 mg | ORAL_TABLET | Freq: Every day | ORAL | Status: DC

## 2013-10-22 NOTE — Progress Notes (Signed)
Patient here for follow up on his COPD

## 2013-10-22 NOTE — Patient Instructions (Signed)
DASH Eating Plan °DASH stands for "Dietary Approaches to Stop Hypertension." The DASH eating plan is a healthy eating plan that has been shown to reduce high blood pressure (hypertension). Additional health benefits may include reducing the risk of type 2 diabetes mellitus, heart disease, and stroke. The DASH eating plan may also help with weight loss. °WHAT DO I NEED TO KNOW ABOUT THE DASH EATING PLAN? °For the DASH eating plan, you will follow these general guidelines: °· Choose foods with a percent daily value for sodium of less than 5% (as listed on the food label). °· Use salt-free seasonings or herbs instead of table salt or sea salt. °· Check with your health care provider or pharmacist before using salt substitutes. °· Eat lower-sodium products, often labeled as "lower sodium" or "no salt added." °· Eat fresh foods. °· Eat more vegetables, fruits, and low-fat dairy products. °· Choose whole grains. Look for the word "whole" as the first word in the ingredient list. °· Choose fish and skinless chicken or turkey more often than red meat. Limit fish, poultry, and meat to 6 oz (170 g) each day. °· Limit sweets, desserts, sugars, and sugary drinks. °· Choose heart-healthy fats. °· Limit cheese to 1 oz (28 g) per day. °· Eat more home-cooked food and less restaurant, buffet, and fast food. °· Limit fried foods. °· Cook foods using methods other than frying. °· Limit canned vegetables. If you do use them, rinse them well to decrease the sodium. °· When eating at a restaurant, ask that your food be prepared with less salt, or no salt if possible. °WHAT FOODS CAN I EAT? °Seek help from a dietitian for individual calorie needs. °Grains °Whole grain or whole wheat bread. Brown rice. Whole grain or whole wheat pasta. Quinoa, bulgur, and whole grain cereals. Low-sodium cereals. Corn or whole wheat flour tortillas. Whole grain cornbread. Whole grain crackers. Low-sodium crackers. °Vegetables °Fresh or frozen vegetables  (raw, steamed, roasted, or grilled). Low-sodium or reduced-sodium tomato and vegetable juices. Low-sodium or reduced-sodium tomato sauce and paste. Low-sodium or reduced-sodium canned vegetables.  °Fruits °All fresh, canned (in natural juice), or frozen fruits. °Meat and Other Protein Products °Ground beef (85% or leaner), grass-fed beef, or beef trimmed of fat. Skinless chicken or turkey. Ground chicken or turkey. Pork trimmed of fat. All fish and seafood. Eggs. Dried beans, peas, or lentils. Unsalted nuts and seeds. Unsalted canned beans. °Dairy °Low-fat dairy products, such as skim or 1% milk, 2% or reduced-fat cheeses, low-fat ricotta or cottage cheese, or plain low-fat yogurt. Low-sodium or reduced-sodium cheeses. °Fats and Oils °Tub margarines without trans fats. Light or reduced-fat mayonnaise and salad dressings (reduced sodium). Avocado. Safflower, olive, or canola oils. Natural peanut or almond butter. °Other °Unsalted popcorn and pretzels. °The items listed above may not be a complete list of recommended foods or beverages. Contact your dietitian for more options. °WHAT FOODS ARE NOT RECOMMENDED? °Grains °White bread. White pasta. White rice. Refined cornbread. Bagels and croissants. Crackers that contain trans fat. °Vegetables °Creamed or fried vegetables. Vegetables in a cheese sauce. Regular canned vegetables. Regular canned tomato sauce and paste. Regular tomato and vegetable juices. °Fruits °Dried fruits. Canned fruit in light or heavy syrup. Fruit juice. °Meat and Other Protein Products °Fatty cuts of meat. Ribs, chicken wings, bacon, sausage, bologna, salami, chitterlings, fatback, hot dogs, bratwurst, and packaged luncheon meats. Salted nuts and seeds. Canned beans with salt. °Dairy °Whole or 2% milk, cream, half-and-half, and cream cheese. Whole-fat or sweetened yogurt. Full-fat   cheeses or blue cheese. Nondairy creamers and whipped toppings. Processed cheese, cheese spreads, or cheese  curds. °Condiments °Onion and garlic salt, seasoned salt, table salt, and sea salt. Canned and packaged gravies. Worcestershire sauce. Tartar sauce. Barbecue sauce. Teriyaki sauce. Soy sauce, including reduced sodium. Steak sauce. Fish sauce. Oyster sauce. Cocktail sauce. Horseradish. Ketchup and mustard. Meat flavorings and tenderizers. Bouillon cubes. Hot sauce. Tabasco sauce. Marinades. Taco seasonings. Relishes. °Fats and Oils °Butter, stick margarine, lard, shortening, ghee, and bacon fat. Coconut, palm kernel, or palm oils. Regular salad dressings. °Other °Pickles and olives. Salted popcorn and pretzels. °The items listed above may not be a complete list of foods and beverages to avoid. Contact your dietitian for more information. °WHERE CAN I FIND MORE INFORMATION? °[employer], [employer], [employer]: www.nhlbi.nih.gov/health/health-topics/topics/dash/ °Document Released: 12/20/2010 Document Revised: 05/17/2013 Document Reviewed: 11/04/2012 °Exit[employer]® Patient Information ©2015 [employer]. This information is not intended to replace advice given to you by your health care provider. Make sure you discuss any questions you have with your health care provider. ° °

## 2013-10-22 NOTE — Progress Notes (Signed)
MRN: [id number] Name: [name]  Sex: male Age: 58 y.o. DOB: August 02, 1955  Allergies: Codeine and Penicillins  Chief Complaint  Patient presents with  . Follow-up    HPI: Patient is 58 y.o. male who has history of COPD elevated blood pressure comes today for followup, he is been using Advair and albuterol when necessary, is to smoke cigarettes, I have advised patient to quit smoking, his blood pressure trend noticed to be elevated for several months, denies any headache dizziness chest and shortness of breath.   Past Medical History  Diagnosis Date  . COPD (chronic obstructive pulmonary disease)   . Asthma     Past Surgical History  Procedure Laterality Date  . Hand surgery        Medication List       This list is accurate as of: 10/22/13  9:58 AM.  Always use your most recent med list.               albuterol 108 (90 BASE) MCG/ACT inhaler  Commonly known as:  PROVENTIL HFA;VENTOLIN HFA  Inhale 2 puffs into the lungs every 4 (four) hours as needed for wheezing or shortness of breath.     amLODipine 2.5 MG tablet  Commonly known as:  NORVASC  Take 1 tablet (2.5 mg total) by mouth daily.     Fluticasone-Salmeterol 100-50 MCG/DOSE Aepb  Commonly known as:  ADVAIR  Inhale 1 puff into the lungs 2 (two) times daily.     nicotine 21 mg/24hr patch  Commonly known as:  NICODERM CQ  Place 1 patch (21 mg total) onto the skin daily.     predniSONE 20 MG tablet  Commonly known as:  DELTASONE  Take 2 tablets (40 mg total) by mouth daily.     Vitamin D (Ergocalciferol) 50000 UNITS Caps capsule  Commonly known as:  DRISDOL  Take 1 capsule (50,000 Units total) by mouth every 7 (seven) days.        Meds ordered this encounter  Medications  . amLODipine (NORVASC) 2.5 MG tablet    Sig: Take 1 tablet (2.5 mg total) by mouth daily.    Dispense:  90 tablet    Refill:  3     There is no immunization history on file for this patient.  Family History    Problem Relation Age of Onset  . Hypertension Paternal Uncle   . Cancer Maternal Grandmother   . Hypertension Maternal Grandfather     History  Substance Use Topics  . Smoking status: Current Some Day Smoker -- 2.00 packs/day for 39 years    Types: Cigarettes  . Smokeless tobacco: Never Used  . Alcohol Use: Yes     Comment: "every once in awhile"    Review of Systems   As noted in HPI  Filed Vitals:   10/22/13 0957  BP: 150/80  Pulse:   Temp:   Resp:     Physical Exam  Physical Exam  Constitutional: No distress.  Eyes: EOM are normal. Pupils are equal, round, and reactive to light.  Cardiovascular: Normal rate and regular rhythm.   Pulmonary/Chest: Breath sounds normal. No respiratory distress. He has no wheezes. He has no rales.  Musculoskeletal: He exhibits no edema.    CBC    Component Value Date/Time   WBC 3.6* 08/30/2013 1129   RBC 4.62 08/30/2013 1129   HGB 15.0 08/30/2013 1129   HCT 43.8 08/30/2013 1129   PLT 144* 08/30/2013 1129   MCV  94.8 08/30/2013 1129   LYMPHSABS 1.6 07/15/2013 1152   MONOABS 0.3 07/15/2013 1152   EOSABS 0.1 07/15/2013 1152   BASOSABS 0.0 07/15/2013 1152    CMP     Component Value Date/Time   NA 140 08/30/2013 1129   K 4.2 08/30/2013 1129   CL 104 08/30/2013 1129   CO2 26 08/30/2013 1129   GLUCOSE 94 08/30/2013 1129   BUN 15 08/30/2013 1129   CREATININE 0.94 08/30/2013 1129   CREATININE 0.88 07/15/2013 1152   CALCIUM 9.2 08/30/2013 1129   PROT 6.9 07/15/2013 1152   ALBUMIN 4.0 07/15/2013 1152   AST 16 07/15/2013 1152   ALT 17 07/15/2013 1152   ALKPHOS 83 07/15/2013 1152   BILITOT 0.8 07/15/2013 1152   GFRNONAA >90 08/30/2013 1129   GFRNONAA >89 07/15/2013 1152   GFRAA >90 08/30/2013 1129   GFRAA >89 07/15/2013 1152    Lab Results  Component Value Date/Time   CHOL 184 07/15/2013 11:52 AM    No components found with this basename: hga1c    Lab Results  Component Value Date/Time   AST 16 07/15/2013 11:52 AM    Assessment and Plan  COPD mixed  type Symptoms are stable continue with Advair and albuterol when necessary.  Tobacco abuse I have counseled patient to quit smoking.  Essential hypertension - Plan: Advised patient for DASH diet, also started on amLODipine (NORVASC) 2.5 MG tablet, he'll come back in 2 weeks for BP check.   Health Maintenance Patient declines for flu shot He will think about pneumovax   Return in about 3 months (around 01/22/2014) for hypertension, COPD, BP check in 2 weeks/Nurse Visit.  Lorayne Marek, MD

## 2013-11-05 ENCOUNTER — Ambulatory Visit: Payer: No Typology Code available for payment source | Attending: Internal Medicine

## 2013-11-05 NOTE — Progress Notes (Unsigned)
   Subjective:    Patient ID: [name], male    DOB: 1955-08-29, 58 y.o.   MRN: [id number]  HPI    Review of Systems     Objective:   Physical Exam        Assessment & Plan:

## 2013-11-05 NOTE — Patient Instructions (Signed)
DASH Eating Plan °DASH stands for "Dietary Approaches to Stop Hypertension." The DASH eating plan is a healthy eating plan that has been shown to reduce high blood pressure (hypertension). Additional health benefits may include reducing the risk of type 2 diabetes mellitus, heart disease, and stroke. The DASH eating plan may also help with weight loss. °WHAT DO I NEED TO KNOW ABOUT THE DASH EATING PLAN? °For the DASH eating plan, you will follow these general guidelines: °· Choose foods with a percent daily value for sodium of less than 5% (as listed on the food label). °· Use salt-free seasonings or herbs instead of table salt or sea salt. °· Check with your health care provider or pharmacist before using salt substitutes. °· Eat lower-sodium products, often labeled as "lower sodium" or "no salt added." °· Eat fresh foods. °· Eat more vegetables, fruits, and low-fat dairy products. °· Choose whole grains. Look for the word "whole" as the first word in the ingredient list. °· Choose fish and skinless chicken or turkey more often than red meat. Limit fish, poultry, and meat to 6 oz (170 g) each day. °· Limit sweets, desserts, sugars, and sugary drinks. °· Choose heart-healthy fats. °· Limit cheese to 1 oz (28 g) per day. °· Eat more home-cooked food and less restaurant, buffet, and fast food. °· Limit fried foods. °· Cook foods using methods other than frying. °· Limit canned vegetables. If you do use them, rinse them well to decrease the sodium. °· When eating at a restaurant, ask that your food be prepared with less salt, or no salt if possible. °WHAT FOODS CAN I EAT? °Seek help from a dietitian for individual calorie needs. °Grains °Whole grain or whole wheat bread. Brown rice. Whole grain or whole wheat pasta. Quinoa, bulgur, and whole grain cereals. Low-sodium cereals. Corn or whole wheat flour tortillas. Whole grain cornbread. Whole grain crackers. Low-sodium crackers. °Vegetables °Fresh or frozen vegetables  (raw, steamed, roasted, or grilled). Low-sodium or reduced-sodium tomato and vegetable juices. Low-sodium or reduced-sodium tomato sauce and paste. Low-sodium or reduced-sodium canned vegetables.  °Fruits °All fresh, canned (in natural juice), or frozen fruits. °Meat and Other Protein Products °Ground beef (85% or leaner), grass-fed beef, or beef trimmed of fat. Skinless chicken or turkey. Ground chicken or turkey. Pork trimmed of fat. All fish and seafood. Eggs. Dried beans, peas, or lentils. Unsalted nuts and seeds. Unsalted canned beans. °Dairy °Low-fat dairy products, such as skim or 1% milk, 2% or reduced-fat cheeses, low-fat ricotta or cottage cheese, or plain low-fat yogurt. Low-sodium or reduced-sodium cheeses. °Fats and Oils °Tub margarines without trans fats. Light or reduced-fat mayonnaise and salad dressings (reduced sodium). Avocado. Safflower, olive, or canola oils. Natural peanut or almond butter. °Other °Unsalted popcorn and pretzels. °The items listed above may not be a complete list of recommended foods or beverages. Contact your dietitian for more options. °WHAT FOODS ARE NOT RECOMMENDED? °Grains °White bread. White pasta. White rice. Refined cornbread. Bagels and croissants. Crackers that contain trans fat. °Vegetables °Creamed or fried vegetables. Vegetables in a cheese sauce. Regular canned vegetables. Regular canned tomato sauce and paste. Regular tomato and vegetable juices. °Fruits °Dried fruits. Canned fruit in light or heavy syrup. Fruit juice. °Meat and Other Protein Products °Fatty cuts of meat. Ribs, chicken wings, bacon, sausage, bologna, salami, chitterlings, fatback, hot dogs, bratwurst, and packaged luncheon meats. Salted nuts and seeds. Canned beans with salt. °Dairy °Whole or 2% milk, cream, half-and-half, and cream cheese. Whole-fat or sweetened yogurt. Full-fat   cheeses or blue cheese. Nondairy creamers and whipped toppings. Processed cheese, cheese spreads, or cheese  curds. °Condiments °Onion and garlic salt, seasoned salt, table salt, and sea salt. Canned and packaged gravies. Worcestershire sauce. Tartar sauce. Barbecue sauce. Teriyaki sauce. Soy sauce, including reduced sodium. Steak sauce. Fish sauce. Oyster sauce. Cocktail sauce. Horseradish. Ketchup and mustard. Meat flavorings and tenderizers. Bouillon cubes. Hot sauce. Tabasco sauce. Marinades. Taco seasonings. Relishes. °Fats and Oils °Butter, stick margarine, lard, shortening, ghee, and bacon fat. Coconut, palm kernel, or palm oils. Regular salad dressings. °Other °Pickles and olives. Salted popcorn and pretzels. °The items listed above may not be a complete list of foods and beverages to avoid. Contact your dietitian for more information. °WHERE CAN I FIND MORE INFORMATION? °[employer], [employer], [employer]: www.nhlbi.nih.gov/health/health-topics/topics/dash/ °Document Released: 12/20/2010 Document Revised: 05/17/2013 Document Reviewed: 11/04/2012 °Exit[employer]® Patient Information ©2015 [employer]. This information is not intended to replace advice given to you by your health care provider. Make sure you discuss any questions you have with your health care provider. ° °

## 2013-11-05 NOTE — Progress Notes (Unsigned)
Patient in for B/P check. Currently on Amlodipine 2.5 mg.  B/P 138/94.   Continue with medication.

## 2013-11-29 ENCOUNTER — Encounter (HOSPITAL_COMMUNITY): Payer: Self-pay | Admitting: Emergency Medicine

## 2013-11-29 ENCOUNTER — Emergency Department (HOSPITAL_COMMUNITY)
Admission: EM | Admit: 2013-11-29 | Discharge: 2013-11-29 | Disposition: A | Discharge status: Home / Self Care | Payer: No Typology Code available for payment source | Attending: Emergency Medicine | Admitting: Emergency Medicine

## 2013-11-29 DIAGNOSIS — K0889 Other specified disorders of teeth and supporting structures: Secondary | ICD-10-CM

## 2013-11-29 DIAGNOSIS — Z720 Tobacco use: Secondary | ICD-10-CM | POA: Insufficient documentation

## 2013-11-29 DIAGNOSIS — K029 Dental caries, unspecified: Secondary | ICD-10-CM | POA: Insufficient documentation

## 2013-11-29 DIAGNOSIS — J441 Chronic obstructive pulmonary disease with (acute) exacerbation: Secondary | ICD-10-CM | POA: Insufficient documentation

## 2013-11-29 DIAGNOSIS — Z880 Allergy status to penicillin: Secondary | ICD-10-CM | POA: Insufficient documentation

## 2013-11-29 DIAGNOSIS — K088 Other specified disorders of teeth and supporting structures: Secondary | ICD-10-CM | POA: Insufficient documentation

## 2013-11-29 DIAGNOSIS — Z7952 Long term (current) use of systemic steroids: Secondary | ICD-10-CM | POA: Insufficient documentation

## 2013-11-29 DIAGNOSIS — Z79899 Other long term (current) drug therapy: Secondary | ICD-10-CM | POA: Insufficient documentation

## 2013-11-29 DIAGNOSIS — Z7951 Long term (current) use of inhaled steroids: Secondary | ICD-10-CM | POA: Insufficient documentation

## 2013-11-29 MED ORDER — HYDROCODONE-ACETAMINOPHEN 5-325 MG PO TABS: 1.0000 | ORAL_TABLET | ORAL | Status: DC | PRN

## 2013-11-29 MED ORDER — HYDROCODONE-ACETAMINOPHEN 5-325 MG PO TABS
1.0000 | ORAL_TABLET | Freq: Once | ORAL | Status: AC
  Administered 2013-11-29: 1 via ORAL
  Filled 2013-11-29: qty 1

## 2013-11-29 MED ORDER — ALBUTEROL SULFATE HFA 108 (90 BASE) MCG/ACT IN AERS
2.0000 | INHALATION_SPRAY | Freq: Once | RESPIRATORY_TRACT | Status: AC
  Administered 2013-11-29: 2 via RESPIRATORY_TRACT
  Filled 2013-11-29: qty 6.7

## 2013-11-29 MED ORDER — CLINDAMYCIN HCL 150 MG PO CAPS: 300.0000 mg | ORAL_CAPSULE | Freq: Four times a day (QID) | ORAL | Status: DC

## 2013-11-29 NOTE — Discharge Instructions (Signed)
Read the information below.  Use the prescribed medication as directed.  Please discuss all new medications with your pharmacist.  Do not take additional tylenol while taking the prescribed pain medication to avoid overdose.  You may return to the Emergency Department at any time for worsening condition or any new symptoms that concern you.  Please call the dentist listed above within 48 hours to schedule a close follow up appointment.  If you develop fevers, swelling in your face, difficulty swallowing or breathing, return to the ER immediately for a recheck.     Dental Caries Dental caries (also called tooth decay) is the most common oral disease. It can occur at any age but is more common in children and young adults.  HOW DENTAL CARIES DEVELOPS  The process of decay begins when bacteria and foods (particularly sugars and starches) combine in your mouth to produce plaque. Plaque is a substance that sticks to the hard, outer surface of a tooth (enamel). The bacteria in plaque produce acids that attack enamel. These acids may also attack the root surface of a tooth (cementum) if it is exposed. Repeated attacks dissolve these surfaces and create holes in the tooth (cavities). If left untreated, the acids destroy the other layers of the tooth.  RISK FACTORS  Frequent sipping of sugary beverages.   Frequent snacking on sugary and starchy foods, especially those that easily get stuck in the teeth.   Poor oral hygiene.   Dry mouth.   Substance abuse such as methamphetamine abuse.   Broken or poor-fitting dental restorations.   Eating disorders.   Gastroesophageal reflux disease (GERD).   Certain radiation treatments to the head and neck. SYMPTOMS In the early stages of dental caries, symptoms are seldom present. Sometimes white, chalky areas may be seen on the enamel or other tooth layers. In later stages, symptoms may include:  Pits and holes on the enamel.  Toothache after sweet,  hot, or cold foods or drinks are consumed.  Pain around the tooth.  Swelling around the tooth. DIAGNOSIS  Most of the time, dental caries is detected during a regular dental checkup. A diagnosis is made after a thorough medical and dental history is taken and the surfaces of your teeth are checked for signs of dental caries. Sometimes special instruments, such as lasers, are used to check for dental caries. Dental X-ray exams may be taken so that areas not visible to the eye (such as between the contact areas of the teeth) can be checked for cavities.  TREATMENT  If dental caries is in its early stages, it may be reversed with a fluoride treatment or an application of a remineralizing agent at the dental office. Thorough brushing and flossing at home is needed to aid these treatments. If it is in its later stages, treatment depends on the location and extent of tooth destruction:   If a small area of the tooth has been destroyed, the destroyed area will be removed and cavities will be filled with a material such as gold, silver amalgam, or composite resin.   If a large area of the tooth has been destroyed, the destroyed area will be removed and a cap (crown) will be fitted over the remaining tooth structure.   If the center part of the tooth (pulp) is affected, a procedure called a root canal will be needed before a filling or crown can be placed.   If most of the tooth has been destroyed, the tooth may need to be  pulled (extracted). HOME CARE INSTRUCTIONS You can prevent, stop, or reverse dental caries at home by practicing good oral hygiene. Good oral hygiene includes:  Thoroughly cleaning your teeth at least twice a day with a toothbrush and dental floss.   Using a fluoride toothpaste. A fluoride mouth rinse may also be used if recommended by your dentist or health care provider.   Restricting the amount of sugary and starchy foods and sugary liquids you consume.   Avoiding  frequent snacking on these foods and sipping of these liquids.   Keeping regular visits with a dentist for checkups and cleanings. PREVENTION   Practice good oral hygiene.  Consider a dental sealant. A dental sealant is a coating material that is applied by your dentist to the pits and grooves of teeth. The sealant prevents food from being trapped in them. It may protect the teeth for several years.  Ask about fluoride supplements if you live in a community without fluorinated water or with water that has a low fluoride content. Use fluoride supplements as directed by your dentist or health care provider.  Allow fluoride varnish applications to teeth if directed by your dentist or health care provider. Document Released: 09/22/2001 Document Revised: 05/17/2013 Document Reviewed: 01/03/2012 [facility] Patient Information 2015 [place], [place]. This information is not intended to replace advice given to you by your health care provider. Make sure you discuss any questions you have with your health care provider.  Dental Pain A tooth ache may be caused by cavities (tooth decay). Cavities expose the nerve of the tooth to air and hot or cold temperatures. It may come from an infection or abscess (also called a boil or furuncle) around your tooth. It is also often caused by dental caries (tooth decay). This causes the pain you are having. DIAGNOSIS  Your caregiver can diagnose this problem by exam. TREATMENT   If caused by an infection, it may be treated with medications which kill germs (antibiotics) and pain medications as prescribed by your caregiver. Take medications as directed.  Only take over-the-counter or prescription medicines for pain, discomfort, or fever as directed by your caregiver.  Whether the tooth ache today is caused by infection or dental disease, you should see your dentist as soon as possible for further care. SEEK MEDICAL CARE IF: The exam and treatment you received today  has been provided on an emergency basis only. This is not a substitute for complete medical or dental care. If your problem worsens or new problems (symptoms) appear, and you are unable to meet with your dentist, call or return to this location. SEEK IMMEDIATE MEDICAL CARE IF:   You have a fever.  You develop redness and swelling of your face, jaw, or neck.  You are unable to open your mouth.  You have severe pain uncontrolled by pain medicine. MAKE SURE YOU:   Understand these instructions.  Will watch your condition.  Will get help right away if you are not doing well or get worse. Document Released: 12/31/2004 Document Revised: 03/25/2011 Document Reviewed: 08/19/2007 [facility] Patient Information 2015 [place], [place]. This information is not intended to replace advice given to you by your health care provider. Make sure you discuss any questions you have with your health care provider.   Emergency Department Resource Guide 1) Find a Doctor and Pay Out of Pocket Although you won't have to find out who is covered by your insurance plan, it is a good idea to ask around and get recommendations. You will  then need to call the office and see if the doctor you have chosen will accept you as a new patient and what types of options they offer for patients who are self-pay. Some doctors offer discounts or will set up payment plans for their patients who do not have insurance, but you will need to ask so you aren't surprised when you get to your appointment.  2) Contact Your Local Health Department Not all health departments have doctors that can see patients for sick visits, but many do, so it is worth a call to see if yours does. If you don't know where your local health department is, you can check in your phone book. The CDC also has a tool to help you locate your state's health department, and many state websites also have listings of all of their local health departments.  3) Find a [facility] If your illness is not likely to be very severe or complicated, you may want to try a walk in clinic. These are popping up all over the country in pharmacies, drugstores, and shopping centers. They're usually staffed by nurse practitioners or physician assistants that have been trained to treat common illnesses and complaints. They're usually fairly quick and inexpensive. However, if you have serious medical issues or chronic medical problems, these are probably not your best option.  No Primary Care Doctor: - Call Health Connect at  [phone] - they can help you locate a primary care doctor that  accepts your insurance, provides certain services, etc. - Physician Referral Service- [phone]  Chronic Pain Problems: Organization         Address  Phone   Notes  [facility]  [phone] Patients need to be referred by their primary care doctor.   Medication Assistance: Organization         Address  Phone   Notes  [facility] Medication [facility] [address] [place], [place] [address] [phone] --Must be a resident of [facility] -- Must have NO insurance coverage whatsoever (no Medicaid/ Medicare, etc.) -- The pt. MUST have a primary care doctor that directs their care regularly and follows them in the community   MedAssist  [phone]   [employer]  [phone]    Agencies that provide inexpensive medical care: Organization         Address  Phone   Notes  [place]  [phone]   [facility]    [phone]   [facility] [place],  [address] [phone]   [place] [address], [place] [phone]   Planned Parenthood    [phone]   [facility]    [phone]   [place] and [place] [address], [place] Phone:  [phone], Fax:  [phone] Hours  of Operation:  9 am - 6 pm, M-F.  Also accepts Medicaid/Medicare and self-pay.  [facility] for [place], [address], [place] Phone: [phone], Fax: [phone]. Hours of Operation:  8:30 am - 5:30 pm, M-F.  Also accepts Medicaid and self-pay.  [facility] [place] [address], [place] Phone: [phone]   [place], [place], [place] [phone], Ext. [phone] Mondays & Thursdays: 7-9 AM.  First 15 patients are seen on a first come, first serve  basis.    [place] Providers:  Organization         Address  Phone   Notes  [facility] [address], [place] [phone] Also accepts self-pay patients.  [facility] [address], [place]  [phone]   [place], [address], [place] [phone]   [facility] [address] [place] [phone]   Lucianne Lei [address], [place]   [phone] Only accepts [place] Access [place] patients after they have their name applied to their card.   Self-Pay (no insurance) in [facility]:  Organization         Address  Phone   Notes  Sickle Cell Patients, [facility] Internal [facility] [place] [phone]   [facility] [place] [phone]   [facility] [place]  [place], [place], [place] [phone]   [facility]/Dr. Osei-Bonsu  [address], [place] or [address], [place] [phone] Phone number for both [place] and [place] locations is the same.  [facility] [address] [place] [phone]   [facility] [address] [place] or [address] [phone]   [facility] [address] [place] [phone], phone; [phone], fax Sees patients 1st and 3rd Saturday of every month.  Must not qualify for public or private insurance (i.e. Medicaid, Medicare, [place] Health Choice, Veterans' Benefits)  Household income should be no more than 200% of the poverty level The clinic cannot treat you if you are pregnant or think you are pregnant  Sexually transmitted diseases are not treated at the clinic.    Dental Care: Organization         Address  Phone  Notes  [facility] Department of [facility] [place] [phone] Accepts children up to age 68 who are enrolled in [place] or [place]; pregnant women with a Medicaid card; and children who have applied for Medicaid or [place] Health Choice, but were declined, whose parents can pay a reduced fee at time of service.  [facility] Department of [facility]  [address], [place] [phone] Accepts children up to age 66 who are enrolled in [place] or [place]; pregnant women with a Medicaid card; and children who have applied for Medicaid or [place] Health Choice, but were declined, whose parents can pay a reduced fee at time of service.  [place] Adult Dental Access PROGRAM  [place] [phone] Patients are seen by appointment only. Walk-ins are not accepted. [place] will see patients 82 years of age and older. Monday - Tuesday (8am-5pm) Most Wednesdays (8:30-5pm) $30 per visit, cash only  [facility] Adult Dental Access PROGRAM  [address], [facility] [phone] Patients are seen by appointment only. Walk-ins are not accepted. [place] will see patients 28 years of age and older. One Wednesday Evening (Monthly: Volunteer Based).  $30 per visit, cash only  [place]  [phone] for adults; Children under age 55, call Graduate  Pediatric Dentistry at [phone]. Children aged 6-14, please call [phone] to request a pediatric application.  Dental services are provided in all areas of dental care including fillings, crowns and bridges, complete and partial dentures, implants, gum treatment, root canals, and extractions. Preventive care is also provided. Treatment is provided to both adults and children. Patients are selected via a lottery and there is often a waiting list.   [facility] [address], [place]  [phone] [email or web address]   [facility] [address] [place], [place] [phone], Ext. [phone] Second and Fourth Thursday of each month, opens at 6:30 AM; Clinic ends at 9 AM.  Patients are seen on a first-come first-served basis, and a limited number are seen during each clinic.   [facility]  [address] Hillard Danker [place], [place] [phone]   Eligibility Requirements You must have lived in [place], [place], or [place] counties for at least the last three months.   You cannot be eligible for state or federal sponsored [employer], including [employer], [place], or [employer].   You generally cannot be eligible for healthcare insurance through your employer.    How to apply: Eligibility screenings are held every Tuesday and Wednesday afternoon from 1:00 pm until 4:00 pm. You do not need an appointment for the interview!  [facility] [address] [place], [place]   [place]  [place] Department  [place]  [phone]    Behavioral Health Resources in the Community: Intensive Outpatient Programs Organization         Address  Phone  Notes  [place] Honor. [address], [place], [place] [phone]   [facility] Outpatient [address], [place], [place]   ADS: Alcohol & Drug Svcs [address], [place], [place]   [place] [address]  [place], [place] or [phone]   Substance Abuse Resources Organization         Address  Phone  Notes  Alcohol and Drug Services  [phone]   [place]  [phone]   [facility] [place]   Chinita Pester  [phone]   Residential & Outpatient Substance Abuse Program  [phone]   Psychological Services Organization         Address  Phone  Notes  [facility] [place]  [place]  [phone]   [place] [address], [place] or [phone]    Mobile Crisis Teams Organization         Address  Phone  Notes  [facility], Mobile Crisis Care Unit  [phone]   [facility]  [address] [place], [place]   [place] Levels [address], [place] [phone]    Self-Help/Support Groups Organization         Address  Phone             Notes  [place]. of [place] - variety of support groups  [place] Call for more information  Narcotics Anonymous (NA), [facility] [address], [employer] [place]  2 meetings at this location   Special educational needs teacher         Address  Phone  Notes  [facility] Residential Treatment [place],    [place]  [phone]   [place]  [address], [place] 751025, [place], [place]   [place], [place] [phone] Admissions: 8am-3pm M-F  Incentives Substance Walker [address],    [place], [place] [phone]   [facility] [address] [place], [place], [place]   [facility] [address],  [place], [place]   [facility] Programs - Intensive Outpatient [address], [place], [place]   [facility] ([place]isha Alberts [place] [address]  [place], [place] [phone] or [phone]   [facility] Services (RTS) [address], [place], [place] Accepts Medicaid  [facility] [place] [address],  [place] [phone] Substance Abuse/Addiction Treatment   [facility] Organization         Address  Phone  Notes  [facility]  [phone]   Domenic Schwab, PhD [address] Arlis [place], [place]   [phone] or [phone]   [address] [place], [place] [phone]   [facility] [address], [place], [place] [phone] Insurance/Medicaid/sponsorship through [facility] and Families [address]                                    [place], [place] [phone] [place] [address], [place] [phone]    Dr. Adele Schilder  [phone]   [facility] [address] Dept. 1) [address] [place] 2) [place] 3)  [place] [address], [place] [phone]  [phone]   [place] [phone] or [phone] (After Hours)

## 2013-11-29 NOTE — ED Provider Notes (Signed)
CSN: [id number]     Arrival date & time 11/29/13  1441 History  This chart was scribed for non-physician practitioner, Clayton Bibles, PA-C working with Wandra Arthurs, MD by Frederich Balding, ED scribe. This patient was seen in room WTR8/WTR8 and the patient's care was started at 3:19 PM.   Chief Complaint  Patient presents with  . Dental Pain   The history is provided by the patient. No language interpreter was used.    HPI Comments: [name] is a 58 y.o. male with history of COPD and asthma who presents to the Emergency Department complaining of right upper and lower dental pain that started 10 days ago. He has used BC powders on his tooth, taken Excedrin and 800 mg ibuprofen with no relief of pain. Pt has also rinsed his mouth with hot water and peroxide and drank alcohol with no relief. Denies fever, chills, sore throat, trouble swallowing, difficulty breathing, body aches. Pt states he is about to run out of his albuterol inhaler.  Denies any change in his chronic COPD.  Past Medical History  Diagnosis Date  . COPD (chronic obstructive pulmonary disease)   . Asthma    Past Surgical History  Procedure Laterality Date  . Hand surgery     Family History  Problem Relation Age of Onset  . Hypertension Paternal Uncle   . Cancer Maternal Grandmother   . Hypertension Maternal Grandfather    History  Substance Use Topics  . Smoking status: Current Some Day Smoker -- 2.00 packs/day for 39 years    Types: Cigarettes  . Smokeless tobacco: Never Used  . Alcohol Use: Yes     Comment: "every once in awhile"    Review of Systems  Constitutional: Negative for fever and chills.  HENT: Positive for dental problem. Negative for sore throat and trouble swallowing.   Musculoskeletal: Negative for myalgias.  All other systems reviewed and are negative.  Allergies  Codeine and Penicillins  Home Medications   Prior to Admission medications   Medication Sig Start Date End Date Taking?  Authorizing Provider  albuterol (PROVENTIL HFA;VENTOLIN HFA) 108 (90 BASE) MCG/ACT inhaler Inhale 2 puffs into the lungs every 4 (four) hours as needed for wheezing or shortness of breath. 09/14/13   Lorayne Marek, MD  amLODipine (NORVASC) 2.5 MG tablet Take 1 tablet (2.5 mg total) by mouth daily. 10/22/13   Lorayne Marek, MD  Fluticasone-Salmeterol (ADVAIR) 100-50 MCG/DOSE AEPB Inhale 1 puff into the lungs 2 (two) times daily. 09/14/13   Lorayne Marek, MD  nicotine (NICODERM CQ) 21 mg/24hr patch Place 1 patch (21 mg total) onto the skin daily. 07/15/13   Lorayne Marek, MD  predniSONE (DELTASONE) 20 MG tablet Take 2 tablets (40 mg total) by mouth daily. 08/30/13   Alvina Chou, PA-C  Vitamin D, Ergocalciferol, (DRISDOL) 50000 UNITS CAPS capsule Take 1 capsule (50,000 Units total) by mouth every 7 (seven) days. 09/03/13   Lorayne Marek, MD   BP 145/83 mmHg  Pulse 95  Temp(Src) 98.4 F (36.9 C) (Oral)  Resp 18  SpO2 99%   Physical Exam  Constitutional: He appears well-developed and well-nourished. No distress.  HENT:  Head: Normocephalic and atraumatic.  Mouth/Throat: Oropharynx is clear and moist.  First and second molars with severe decay down to gumline. Right lower molars 1-3 with severe decay down to gumline. Tender to palpation. No facial swelling. No paratracheal tenderness.  Neck: Normal range of motion. Neck supple.  Cardiovascular: Normal rate and regular rhythm.  Pulmonary/Chest: Effort normal. No stridor. He has wheezes.  Moving air well in all fields. Expiratory wheezes.  Lymphadenopathy:    He has no cervical adenopathy.  No head or neck lymphadenopathy.  Neurological: He is alert.  Skin: He is not diaphoretic.  Nursing note and vitals reviewed.   ED Course  Procedures (including critical care time)  DIAGNOSTIC STUDIES: Oxygen Saturation is 99% on RA, normal by my interpretation.    COORDINATION OF CARE: 3:23 PM-Discussed treatment plan which includes an antibiotic,  pain medication and refilling inhaler with pt at bedside and pt agreed to plan. Will give pt dental referrals and advised him to follow up.   Labs Review Labs Reviewed - No data to display  Imaging Review No results found.   EKG Interpretation None      MDM   Final diagnoses:  Pain, dental  Dental decay    Afebrile, nontoxic patient with new dental pain.  No obvious abscess.  No concerning findings on exam.  Doubt deep space head or neck infection.  Doubt Ludwig's angina.  D/C home with antibiotic, pain medication and dental follow up.  Discussed findings, treatment, and follow up  with patient.  Pt given return precautions.  Pt verbalizes understanding and agrees with plan.       I personally performed the services described in this documentation, which was scribed in my presence. The recorded information has been reviewed and is accurate.  Clayton Bibles, PA-C 11/29/13 Eloy Yao, MD 11/29/13 [phone]

## 2013-11-29 NOTE — ED Notes (Signed)
PA at bedside.

## 2013-11-29 NOTE — ED Notes (Addendum)
Upon medication administration patient on floor on hands and knees. When asked why patient was in the floor he states "I'm praying". Pt punching stretcher seat. Pt wanted to remain in floor while taking meds.   He was advised to follow up with dentistry in the next two days. Pt was also provided an inhaler to go home with. Patient alert, oriented, and ambulatory upon DC.

## 2013-11-29 NOTE — ED Notes (Signed)
Pt c/o right top and bottom dental pain. Stating it started on nov 6.

## 2013-12-15 ENCOUNTER — Ambulatory Visit: Payer: No Typology Code available for payment source | Admitting: Internal Medicine

## 2014-01-18 ENCOUNTER — Encounter: Payer: Self-pay | Admitting: Internal Medicine

## 2014-01-18 ENCOUNTER — Ambulatory Visit: Payer: Self-pay | Attending: Internal Medicine | Admitting: Internal Medicine

## 2014-01-18 VITALS — BP 140/80 | HR 84 | Temp 98.0°F | Resp 16 | Wt 245.0 lb

## 2014-01-18 DIAGNOSIS — K0889 Other specified disorders of teeth and supporting structures: Secondary | ICD-10-CM

## 2014-01-18 DIAGNOSIS — J449 Chronic obstructive pulmonary disease, unspecified: Secondary | ICD-10-CM | POA: Insufficient documentation

## 2014-01-18 DIAGNOSIS — K088 Other specified disorders of teeth and supporting structures: Secondary | ICD-10-CM

## 2014-01-18 DIAGNOSIS — Z792 Long term (current) use of antibiotics: Secondary | ICD-10-CM | POA: Insufficient documentation

## 2014-01-18 DIAGNOSIS — Z7952 Long term (current) use of systemic steroids: Secondary | ICD-10-CM | POA: Insufficient documentation

## 2014-01-18 DIAGNOSIS — K029 Dental caries, unspecified: Secondary | ICD-10-CM | POA: Insufficient documentation

## 2014-01-18 DIAGNOSIS — I10 Essential (primary) hypertension: Secondary | ICD-10-CM | POA: Insufficient documentation

## 2014-01-18 DIAGNOSIS — Z720 Tobacco use: Secondary | ICD-10-CM

## 2014-01-18 DIAGNOSIS — J45909 Unspecified asthma, uncomplicated: Secondary | ICD-10-CM | POA: Insufficient documentation

## 2014-01-18 DIAGNOSIS — Z7951 Long term (current) use of inhaled steroids: Secondary | ICD-10-CM | POA: Insufficient documentation

## 2014-01-18 DIAGNOSIS — F17210 Nicotine dependence, cigarettes, uncomplicated: Secondary | ICD-10-CM | POA: Insufficient documentation

## 2014-01-18 DIAGNOSIS — IMO0001 Reserved for inherently not codable concepts without codable children: Secondary | ICD-10-CM

## 2014-01-18 DIAGNOSIS — R030 Elevated blood-pressure reading, without diagnosis of hypertension: Secondary | ICD-10-CM

## 2014-01-18 MED ORDER — CLONIDINE HCL 0.1 MG PO TABS
0.1000 mg | ORAL_TABLET | Freq: Once | ORAL | Status: AC
  Administered 2014-01-18: 0.1 mg via ORAL

## 2014-01-18 MED ORDER — FLUTICASONE-SALMETEROL 100-50 MCG/DOSE IN AEPB: 1.0000 | INHALATION_SPRAY | Freq: Two times a day (BID) | RESPIRATORY_TRACT | Status: DC

## 2014-01-18 MED ORDER — CLINDAMYCIN HCL 150 MG PO CAPS: 300.0000 mg | ORAL_CAPSULE | Freq: Three times a day (TID) | ORAL | Status: DC

## 2014-01-18 MED ORDER — ALBUTEROL SULFATE HFA 108 (90 BASE) MCG/ACT IN AERS: 2.0000 | INHALATION_SPRAY | RESPIRATORY_TRACT | Status: DC | PRN

## 2014-01-18 NOTE — Progress Notes (Signed)
Patient complains of having an abcess to the top right side of his mouth Making it difficult for him to eat due to the pain and discomfort Has not been taking his blood pressure medication regularly and today his blood Pressure is elevated

## 2014-01-18 NOTE — Progress Notes (Signed)
MRN: [id number] Name: [name]  Sex: male Age: 59 y.o. DOB: 08-Apr-1955  Allergies: Codeine and Penicillins  Chief Complaint  Patient presents with  . dental referral    HPI: Patient is 59 y.o. male who has history of hypertension, COPD, tobacco abuse comes today for followup his major concern is some dental pain, EMR reviewed patient went to the emergency room in the month of November was having similar symptoms and was prescribed antibiotic and recommended to see a dentist as per patient since has orange card expired he could not follow with the dentist and is requesting referral and is requesting antibiotic, today's blood pressure is elevated as per patient he has not been taking his blood pressure medication regularly, I have advised patient for compliance with the medication also patient has been smoking cigarettes, trying to cut down. Patient is requesting refill on his medications.  Past Medical History  Diagnosis Date  . COPD (chronic obstructive pulmonary disease)   . Asthma     Past Surgical History  Procedure Laterality Date  . Hand surgery        Medication List       This list is accurate as of: 01/18/14 10:55 AM.  Always use your most recent med list.               albuterol 108 (90 BASE) MCG/ACT inhaler  Commonly known as:  PROVENTIL HFA;VENTOLIN HFA  Inhale 2 puffs into the lungs every 4 (four) hours as needed for wheezing or shortness of breath.     amLODipine 2.5 MG tablet  Commonly known as:  NORVASC  Take 1 tablet (2.5 mg total) by mouth daily.     clindamycin 150 MG capsule  Commonly known as:  CLEOCIN  Take 2 capsules (300 mg total) by mouth 3 (three) times daily.     Fluticasone-Salmeterol 100-50 MCG/DOSE Aepb  Commonly known as:  ADVAIR  Inhale 1 puff into the lungs 2 (two) times daily.     HYDROcodone-acetaminophen 5-325 MG per tablet  Commonly known as:  NORCO/VICODIN  Take 1-2 tablets by mouth every 4 (four) hours as needed  for moderate pain or severe pain.     nicotine 21 mg/24hr patch  Commonly known as:  NICODERM CQ  Place 1 patch (21 mg total) onto the skin daily.     predniSONE 20 MG tablet  Commonly known as:  DELTASONE  Take 2 tablets (40 mg total) by mouth daily.     Vitamin D (Ergocalciferol) 50000 UNITS Caps capsule  Commonly known as:  DRISDOL  Take 1 capsule (50,000 Units total) by mouth every 7 (seven) days.        Meds ordered this encounter  Medications  . cloNIDine (CATAPRES) tablet 0.1 mg    Sig:   . albuterol (PROVENTIL HFA;VENTOLIN HFA) 108 (90 BASE) MCG/ACT inhaler    Sig: Inhale 2 puffs into the lungs every 4 (four) hours as needed for wheezing or shortness of breath.    Dispense:  3 Inhaler    Refill:  3  . clindamycin (CLEOCIN) 150 MG capsule    Sig: Take 2 capsules (300 mg total) by mouth 3 (three) times daily.    Dispense:  60 capsule    Refill:  0  . Fluticasone-Salmeterol (ADVAIR) 100-50 MCG/DOSE AEPB    Sig: Inhale 1 puff into the lungs 2 (two) times daily.    Dispense:  3 each    Refill:  3  There is no immunization history on file for this patient.  Family History  Problem Relation Age of Onset  . Hypertension Paternal Uncle   . Cancer Maternal Grandmother   . Hypertension Maternal Grandfather     History  Substance Use Topics  . Smoking status: Current Some Day Smoker -- 2.00 packs/day for 39 years    Types: Cigarettes  . Smokeless tobacco: Never Used  . Alcohol Use: Yes     Comment: "every once in awhile"    Review of Systems   As noted in HPI  Filed Vitals:   01/18/14 1055  BP: 140/80  Pulse:   Temp:   Resp:     Physical Exam  Physical Exam  HENT:  Right upper molar caries/Decay teeth   Eyes: EOM are normal. Pupils are equal, round, and reactive to light.  Cardiovascular: Normal rate and regular rhythm.   Pulmonary/Chest: Breath sounds normal. No respiratory distress. He has no wheezes. He has no rales.    CBC    Component  Value Date/Time   WBC 3.6* 08/30/2013 1129   RBC 4.62 08/30/2013 1129   HGB 15.0 08/30/2013 1129   HCT 43.8 08/30/2013 1129   PLT 144* 08/30/2013 1129   MCV 94.8 08/30/2013 1129   LYMPHSABS 1.6 07/15/2013 1152   MONOABS 0.3 07/15/2013 1152   EOSABS 0.1 07/15/2013 1152   BASOSABS 0.0 07/15/2013 1152    CMP     Component Value Date/Time   NA 140 08/30/2013 1129   K 4.2 08/30/2013 1129   CL 104 08/30/2013 1129   CO2 26 08/30/2013 1129   GLUCOSE 94 08/30/2013 1129   BUN 15 08/30/2013 1129   CREATININE 0.94 08/30/2013 1129   CREATININE 0.88 07/15/2013 1152   CALCIUM 9.2 08/30/2013 1129   PROT 6.9 07/15/2013 1152   ALBUMIN 4.0 07/15/2013 1152   AST 16 07/15/2013 1152   ALT 17 07/15/2013 1152   ALKPHOS 83 07/15/2013 1152   BILITOT 0.8 07/15/2013 1152   GFRNONAA >90 08/30/2013 1129   GFRNONAA >89 07/15/2013 1152   GFRAA >90 08/30/2013 1129   GFRAA >89 07/15/2013 1152    Lab Results  Component Value Date/Time   CHOL 184 07/15/2013 11:52 AM    No components found for: HGA1C  Lab Results  Component Value Date/Time   AST 16 07/15/2013 11:52 AM    Assessment and Plan  Elevated blood pressure - Plan: cloNIDine (CATAPRES) tablet 0.1 mg, repeat manual blood pressure is 140/80.  Pain, dental - Plan: Ambulatory referral to Dentistry, clindamycin (CLEOCIN) 150 MG capsule  Essential hypertension Pressure is is uncontrolled his last patient has been not compliant, advise patient with compliance in taking his medications regularly, advised for DASH diet, advised to quit smoking.  Tobacco abuse Patient is trying to quit smoking.  COPD mixed type - Plan: albuterol (PROVENTIL HFA;VENTOLIN HFA) 108 (90 BASE) MCG/ACT inhaler, Fluticasone-Salmeterol (ADVAIR) 100-50 MCG/DOSE AEPB   Health Maintenance  -Vaccinations: Patient declines Pneumovax and flu shot  Return in about 3 months (around 04/19/2014), or if symptoms worsen or fail to improve, for hypertension, BP check in 2  weeks/Nurse Visit.  Lorayne Marek, MD

## 2014-01-18 NOTE — Patient Instructions (Addendum)
DASH Eating Plan DASH stands for "Dietary Approaches to Stop Hypertension." The DASH eating plan is a healthy eating plan that has been shown to reduce high blood pressure (hypertension). Additional health benefits may include reducing the risk of type 2 diabetes mellitus, heart disease, and stroke. The DASH eating plan may also help with weight loss. WHAT DO I NEED TO KNOW ABOUT THE DASH EATING PLAN? For the DASH eating plan, you will follow these general guidelines:  Choose foods with a percent daily value for sodium of less than 5% (as listed on the food label).  Use salt-free seasonings or herbs instead of table salt or sea salt.  Check with your health care provider or pharmacist before using salt substitutes.  Eat lower-sodium products, often labeled as "lower sodium" or "no salt added."  Eat fresh foods.  Eat more vegetables, fruits, and low-fat dairy products.  Choose whole grains. Look for the word "whole" as the first word in the ingredient list.  Choose fish and skinless chicken or turkey more often than red meat. Limit fish, poultry, and meat to 6 oz (170 g) each day.  Limit sweets, desserts, sugars, and sugary drinks.  Choose heart-healthy fats.  Limit cheese to 1 oz (28 g) per day.  Eat more home-cooked food and less restaurant, buffet, and fast food.  Limit fried foods.  Cook foods using methods other than frying.  Limit canned vegetables. If you do use them, rinse them well to decrease the sodium.  When eating at a restaurant, ask that your food be prepared with less salt, or no salt if possible. WHAT FOODS CAN I EAT? Seek help from a dietitian for individual calorie needs. Grains Whole grain or whole wheat bread. Brown rice. Whole grain or whole wheat pasta. Quinoa, bulgur, and whole grain cereals. Low-sodium cereals. Corn or whole wheat flour tortillas. Whole grain cornbread. Whole grain crackers. Low-sodium crackers. Vegetables Fresh or frozen vegetables  (raw, steamed, roasted, or grilled). Low-sodium or reduced-sodium tomato and vegetable juices. Low-sodium or reduced-sodium tomato sauce and paste. Low-sodium or reduced-sodium canned vegetables.  Fruits All fresh, canned (in natural juice), or frozen fruits. Meat and Other Protein Products Ground beef (85% or leaner), grass-fed beef, or beef trimmed of fat. Skinless chicken or turkey. Ground chicken or turkey. Pork trimmed of fat. All fish and seafood. Eggs. Dried beans, peas, or lentils. Unsalted nuts and seeds. Unsalted canned beans. Dairy Low-fat dairy products, such as skim or 1% milk, 2% or reduced-fat cheeses, low-fat ricotta or cottage cheese, or plain low-fat yogurt. Low-sodium or reduced-sodium cheeses. Fats and Oils Tub margarines without trans fats. Light or reduced-fat mayonnaise and salad dressings (reduced sodium). Avocado. Safflower, olive, or canola oils. Natural peanut or almond butter. Other Unsalted popcorn and pretzels. The items listed above may not be a complete list of recommended foods or beverages. Contact your dietitian for more options. WHAT FOODS ARE NOT RECOMMENDED? Grains White bread. White pasta. White rice. Refined cornbread. Bagels and croissants. Crackers that contain trans fat. Vegetables Creamed or fried vegetables. Vegetables in a cheese sauce. Regular canned vegetables. Regular canned tomato sauce and paste. Regular tomato and vegetable juices. Fruits Dried fruits. Canned fruit in light or heavy syrup. Fruit juice. Meat and Other Protein Products Fatty cuts of meat. Ribs, chicken wings, bacon, sausage, bologna, salami, chitterlings, fatback, hot dogs, bratwurst, and packaged luncheon meats. Salted nuts and seeds. Canned beans with salt. Dairy Whole or 2% milk, cream, half-and-half, and cream cheese. Whole-fat or sweetened yogurt. Full-fat   cheeses or blue cheese. Nondairy creamers and whipped toppings. Processed cheese, cheese spreads, or cheese  curds. Condiments Onion and garlic salt, seasoned salt, table salt, and sea salt. Canned and packaged gravies. Worcestershire sauce. Tartar sauce. Barbecue sauce. Teriyaki sauce. Soy sauce, including reduced sodium. Steak sauce. Fish sauce. Oyster sauce. Cocktail sauce. Horseradish. Ketchup and mustard. Meat flavorings and tenderizers. Bouillon cubes. Hot sauce. Tabasco sauce. Marinades. Taco seasonings. Relishes. Fats and Oils Butter, stick margarine, lard, shortening, ghee, and bacon fat. Coconut, palm kernel, or palm oils. Regular salad dressings. Other Pickles and olives. Salted popcorn and pretzels. The items listed above may not be a complete list of foods and beverages to avoid. Contact your dietitian for more information. WHERE CAN I FIND MORE INFORMATION? National Heart, Lung, and [employer] Institute: www.nhlbi.nih.gov/health/health-topics/topics/dash/ Document Released: 12/20/2010 Document Revised: 05/17/2013 Document Reviewed: 11/04/2012 ExitCare Patient Information 2015 [employer]. This information is not intended to replace advice given to you by your health care provider. Make sure you discuss any questions you have with your health care provider. Smoking Cessation Quitting smoking is important to your health and has many advantages. However, it is not always easy to quit since nicotine is a very addictive drug. Oftentimes, people try 3 times or more before being able to quit. This document explains the best ways for you to prepare to quit smoking. Quitting takes hard work and a lot of effort, but you can do it. ADVANTAGES OF QUITTING SMOKING  You will live longer, feel better, and live better.  Your body will feel the impact of quitting smoking almost immediately.  Within 20 minutes, blood pressure decreases. Your pulse returns to its normal level.  After 8 hours, carbon monoxide levels in the blood return to normal. Your oxygen level increases.  After 24 hours, the chance of  having a heart attack starts to decrease. Your breath, hair, and body stop smelling like smoke.  After 48 hours, damaged nerve endings begin to recover. Your sense of taste and smell improve.  After 72 hours, the body is virtually free of nicotine. Your bronchial tubes relax and breathing becomes easier.  After 2 to 12 weeks, lungs can hold more air. Exercise becomes easier and circulation improves.  The risk of having a heart attack, stroke, cancer, or lung disease is greatly reduced.  After 1 year, the risk of coronary heart disease is cut in half.  After 5 years, the risk of stroke falls to the same as a nonsmoker.  After 10 years, the risk of lung cancer is cut in half and the risk of other cancers decreases significantly.  After 15 years, the risk of coronary heart disease drops, usually to the level of a nonsmoker.  If you are pregnant, quitting smoking will improve your chances of having a healthy baby.  The people you live with, especially any children, will be healthier.  You will have extra money to spend on things other than cigarettes. QUESTIONS TO THINK ABOUT BEFORE ATTEMPTING TO QUIT You may want to talk about your answers with your health care provider.  Why do you want to quit?  If you tried to quit in the past, what helped and what did not?  What will be the most difficult situations for you after you quit? How will you plan to handle them?  Who can help you through the tough times? Your family? Friends? A health care provider?  What pleasures do you get from smoking? What ways can you still get pleasure   if you quit? Here are some questions to ask your health care provider:  How can you help me to be successful at quitting?  What medicine do you think would be best for me and how should I take it?  What should I do if I need more help?  What is smoking withdrawal like? How can I get information on withdrawal? GET READY  Set a quit date.  Change your  environment by getting rid of all cigarettes, ashtrays, matches, and lighters in your home, car, or work. Do not let people smoke in your home.  Review your past attempts to quit. Think about what worked and what did not. GET SUPPORT AND ENCOURAGEMENT You have a better chance of being successful if you have help. You can get support in many ways.  Tell your family, friends, and coworkers that you are going to quit and need their support. Ask them not to smoke around you.  Get individual, group, or telephone counseling and support. Programs are available at local hospitals and health centers. Call your local health department for information about programs in your area.  Spiritual beliefs and practices may help some smokers quit.  Download a "quit meter" on your computer to keep track of quit statistics, such as how long you have gone without smoking, cigarettes not smoked, and money saved.  Get a self-help book about quitting smoking and staying off tobacco. LEARN NEW SKILLS AND BEHAVIORS  Distract yourself from urges to smoke. Talk to someone, go for a walk, or occupy your time with a task.  Change your normal routine. Take a different route to work. Drink tea instead of coffee. Eat breakfast in a different place.  Reduce your stress. Take a hot bath, exercise, or read a book.  Plan something enjoyable to do every day. Reward yourself for not smoking.  Explore interactive web-based programs that specialize in helping you quit. GET MEDICINE AND USE IT CORRECTLY Medicines can help you stop smoking and decrease the urge to smoke. Combining medicine with the above behavioral methods and support can greatly increase your chances of successfully quitting smoking.  Nicotine replacement therapy helps deliver nicotine to your body without the negative effects and risks of smoking. Nicotine replacement therapy includes nicotine gum, lozenges, inhalers, nasal sprays, and skin patches. Some may be  available over-the-counter and others require a prescription.  Antidepressant medicine helps people abstain from smoking, but how this works is unknown. This medicine is available by prescription.  Nicotinic receptor partial agonist medicine simulates the effect of nicotine in your brain. This medicine is available by prescription. Ask your health care provider for advice about which medicines to use and how to use them based on your health history. Your health care provider will tell you what side effects to look out for if you choose to be on a medicine or therapy. Carefully read the information on the package. Do not use any other product containing nicotine while using a nicotine replacement product.  RELAPSE OR DIFFICULT SITUATIONS Most relapses occur within the first 3 months after quitting. Do not be discouraged if you start smoking again. Remember, most people try several times before finally quitting. You may have symptoms of withdrawal because your body is used to nicotine. You may crave cigarettes, be irritable, feel very hungry, cough often, get headaches, or have difficulty concentrating. The withdrawal symptoms are only temporary. They are strongest when you first quit, but they will go away within 10-14 days. To reduce the   chances of relapse, try to:  Avoid drinking alcohol. Drinking lowers your chances of successfully quitting.  Reduce the amount of caffeine you consume. Once you quit smoking, the amount of caffeine in your body increases and can give you symptoms, such as a rapid heartbeat, sweating, and anxiety.  Avoid smokers because they can make you want to smoke.  Do not let weight gain distract you. Many smokers will gain weight when they quit, usually less than 10 pounds. Eat a healthy diet and stay active. You can always lose the weight gained after you quit.  Find ways to improve your mood other than smoking. FOR MORE INFORMATION  www.smokefree.gov  Document Released:  12/25/2000 Document Revised: 05/17/2013 Document Reviewed: 04/11/2011 Exit[employer] Patient Information 2015 [employer]. This information is not intended to replace advice given to you by your health care provider. Make sure you discuss any questions you have with your health care provider.  

## 2014-01-20 ENCOUNTER — Other Ambulatory Visit: Payer: Self-pay

## 2014-01-20 ENCOUNTER — Encounter (HOSPITAL_COMMUNITY): Payer: Self-pay

## 2014-01-20 ENCOUNTER — Emergency Department (HOSPITAL_COMMUNITY)
Admission: EM | Admit: 2014-01-20 | Discharge: 2014-01-20 | Disposition: A | Discharge status: Home / Self Care | Payer: Self-pay | Attending: Emergency Medicine | Admitting: Emergency Medicine

## 2014-01-20 ENCOUNTER — Emergency Department (HOSPITAL_COMMUNITY): Payer: Self-pay

## 2014-01-20 ENCOUNTER — Telehealth: Payer: Self-pay | Admitting: Internal Medicine

## 2014-01-20 DIAGNOSIS — Z792 Long term (current) use of antibiotics: Secondary | ICD-10-CM | POA: Insufficient documentation

## 2014-01-20 DIAGNOSIS — K0889 Other specified disorders of teeth and supporting structures: Secondary | ICD-10-CM

## 2014-01-20 DIAGNOSIS — K047 Periapical abscess without sinus: Secondary | ICD-10-CM

## 2014-01-20 DIAGNOSIS — Z7952 Long term (current) use of systemic steroids: Secondary | ICD-10-CM | POA: Insufficient documentation

## 2014-01-20 DIAGNOSIS — Z880 Allergy status to penicillin: Secondary | ICD-10-CM | POA: Insufficient documentation

## 2014-01-20 DIAGNOSIS — Z79899 Other long term (current) drug therapy: Secondary | ICD-10-CM | POA: Insufficient documentation

## 2014-01-20 DIAGNOSIS — J449 Chronic obstructive pulmonary disease, unspecified: Secondary | ICD-10-CM | POA: Insufficient documentation

## 2014-01-20 DIAGNOSIS — R220 Localized swelling, mass and lump, head: Secondary | ICD-10-CM

## 2014-01-20 DIAGNOSIS — Z720 Tobacco use: Secondary | ICD-10-CM | POA: Insufficient documentation

## 2014-01-20 LAB — CBC WITH DIFFERENTIAL/PLATELET
Basophils Absolute: 0 10*3/uL (ref 0.0–0.1)
Basophils Relative: 0 % (ref 0–1)
EOS PCT: 1 % (ref 0–5)
Eosinophils Absolute: 0.1 10*3/uL (ref 0.0–0.7)
HCT: 40.1 % (ref 39.0–52.0)
Hemoglobin: 13.1 g/dL (ref 13.0–17.0)
Lymphocytes Relative: 19 % (ref 12–46)
Lymphs Abs: 1.1 10*3/uL (ref 0.7–4.0)
MCH: 31.2 pg (ref 26.0–34.0)
MCHC: 32.7 g/dL (ref 30.0–36.0)
MCV: 95.5 fL (ref 78.0–100.0)
Monocytes Absolute: 0.7 10*3/uL (ref 0.1–1.0)
Monocytes Relative: 12 % (ref 3–12)
Neutro Abs: 4.1 10*3/uL (ref 1.7–7.7)
Neutrophils Relative %: 68 % (ref 43–77)
Platelets: 165 10*3/uL (ref 150–400)
RBC: 4.2 MIL/uL — AB (ref 4.22–5.81)
RDW: 13.9 % (ref 11.5–15.5)
WBC: 6 10*3/uL (ref 4.0–10.5)

## 2014-01-20 LAB — BASIC METABOLIC PANEL
ANION GAP: 3 — AB (ref 5–15)
BUN: 9 mg/dL (ref 6–23)
CALCIUM: 8.4 mg/dL (ref 8.4–10.5)
CHLORIDE: 105 meq/L (ref 96–112)
CO2: 28 mmol/L (ref 19–32)
CREATININE: 0.93 mg/dL (ref 0.50–1.35)
Glucose, Bld: 112 mg/dL — ABNORMAL HIGH (ref 70–99)
POTASSIUM: 3.2 mmol/L — AB (ref 3.5–5.1)
SODIUM: 136 mmol/L (ref 135–145)

## 2014-01-20 MED ORDER — CLINDAMYCIN HCL 150 MG PO CAPS: 300.0000 mg | ORAL_CAPSULE | Freq: Four times a day (QID) | ORAL | Status: DC

## 2014-01-20 MED ORDER — SODIUM CHLORIDE 0.9 % IV SOLN
Freq: Once | INTRAVENOUS | Status: AC
  Administered 2014-01-20: 05:00:00 via INTRAVENOUS

## 2014-01-20 MED ORDER — IOHEXOL 300 MG/ML  SOLN
100.0000 mL | Freq: Once | INTRAMUSCULAR | Status: AC | PRN
  Administered 2014-01-20: 100 mL via INTRAVENOUS

## 2014-01-20 MED ORDER — MORPHINE SULFATE 4 MG/ML IJ SOLN
INTRAMUSCULAR | Status: AC
  Administered 2014-01-20: 4 mg
  Filled 2014-01-20: qty 1

## 2014-01-20 MED ORDER — OXYCODONE-ACETAMINOPHEN 5-325 MG PO TABS: 1.0000 | ORAL_TABLET | ORAL | Status: DC | PRN

## 2014-01-20 MED ORDER — OXYCODONE-ACETAMINOPHEN 5-325 MG PO TABS
2.0000 | ORAL_TABLET | Freq: Once | ORAL | Status: AC
  Administered 2014-01-20: 2 via ORAL
  Filled 2014-01-20: qty 2

## 2014-01-20 NOTE — Progress Notes (Unsigned)
Patient was referred to [facility] Patient was yelling and cursing and bad attitude toward the staff Patient was dismissed from the practice Patient can no longer get referred to this establishment Patient will be responsible for out of pocket expenses at other facilities

## 2014-01-20 NOTE — ED Notes (Signed)
Shari, PA at bedside. 

## 2014-01-20 NOTE — ED Notes (Signed)
Pt states he has taken this antibiotic before in November for the same without any problems.

## 2014-01-20 NOTE — ED Provider Notes (Signed)
CSN: [id number]     Arrival date & time 01/20/14  0321 History   First MD Initiated Contact with Patient 01/20/14 [phone]     Chief Complaint  Patient presents with  . Facial Swelling     (Consider location/radiation/quality/duration/timing/severity/associated sxs/prior Treatment) Patient is a 59 y.o. male presenting with tooth pain. The history is provided by the patient. No language interpreter was used.  Dental Pain Location:  Upper Upper teeth location:  3/RU 1st molar Severity:  Moderate Associated symptoms: facial swelling   Associated symptoms: no fever   Associated symptoms comment:  He presents to the ED with right facial swelling that started tonight after he went to bed. He has been taking Clindamycin for the past 48 hours for dental infection. The pain tonight extends from the problem tooth up to the paranasal area and maxillary sinus. No fever or vomiting.    Past Medical History  Diagnosis Date  . COPD (chronic obstructive pulmonary disease)   . Asthma    Past Surgical History  Procedure Laterality Date  . Hand surgery     Family History  Problem Relation Age of Onset  . Hypertension Paternal Uncle   . Cancer Maternal Grandmother   . Hypertension Maternal Grandfather    History  Substance Use Topics  . Smoking status: Current Some Day Smoker -- 2.00 packs/day for 39 years    Types: Cigarettes  . Smokeless tobacco: Never Used  . Alcohol Use: Yes     Comment: "every once in awhile"    Review of Systems  Constitutional: Negative for fever and chills.  HENT: Positive for dental problem and facial swelling. Negative for trouble swallowing.   Respiratory: Negative.   Cardiovascular: Negative.   Gastrointestinal: Negative.  Negative for vomiting.  Neurological: Negative.       Allergies  Codeine and Penicillins  Home Medications   Prior to Admission medications   Medication Sig Start Date End Date Taking? Authorizing Provider  albuterol (PROVENTIL  HFA;VENTOLIN HFA) 108 (90 BASE) MCG/ACT inhaler Inhale 2 puffs into the lungs every 4 (four) hours as needed for wheezing or shortness of breath. 01/18/14  Yes Deepak Advani, MD  amLODipine (NORVASC) 2.5 MG tablet Take 1 tablet (2.5 mg total) by mouth daily. 10/22/13  Yes Lorayne Marek, MD  clindamycin (CLEOCIN) 150 MG capsule Take 2 capsules (300 mg total) by mouth 3 (three) times daily. 01/18/14  Yes Deepak Advani, MD  diphenhydramine-acetaminophen (TYLENOL PM) 25-500 MG TABS Take 2 tablets by mouth at bedtime as needed (sleep/pain).   Yes Historical Provider, MD  Fluticasone-Salmeterol (ADVAIR) 100-50 MCG/DOSE AEPB Inhale 1 puff into the lungs 2 (two) times daily. 01/18/14  Yes Lorayne Marek, MD  ibuprofen (ADVIL,MOTRIN) 800 MG tablet Take 800 mg by mouth every 8 (eight) hours as needed for headache or moderate pain.   Yes Historical Provider, MD  HYDROcodone-acetaminophen (NORCO/VICODIN) 5-325 MG per tablet Take 1-2 tablets by mouth every 4 (four) hours as needed for moderate pain or severe pain. Patient not taking: Reported on 01/20/2014 11/29/13   Clayton Bibles, PA-C  nicotine (NICODERM CQ) 21 mg/24hr patch Place 1 patch (21 mg total) onto the skin daily. Patient not taking: Reported on 01/20/2014 07/15/13   Lorayne Marek, MD  predniSONE (DELTASONE) 20 MG tablet Take 2 tablets (40 mg total) by mouth daily. Patient not taking: Reported on 01/20/2014 08/30/13   Alvina Chou, PA-C  Vitamin D, Ergocalciferol, (DRISDOL) 50000 UNITS CAPS capsule Take 1 capsule (50,000 Units total) by mouth every 7 (  seven) days. Patient not taking: Reported on 01/20/2014 09/03/13   Lorayne Marek, MD   BP 185/114 mmHg  Pulse 108  Temp(Src) 99.1 F (37.3 C) (Oral)  Resp 18  SpO2 98% Physical Exam  Constitutional: He is oriented to person, place, and time. He appears well-developed and well-nourished.  HENT:  Right facial swelling with greatest swelling over maxillary sinus and right nose. There is a large, fluctuant swelling  over #3 molar. No active drainage. Mildly tender.   Neck: Normal range of motion. Neck supple.  Pulmonary/Chest: Effort normal.  Musculoskeletal: Normal range of motion.  Lymphadenopathy:    He has no cervical adenopathy.  Neurological: He is alert and oriented to person, place, and time.  Skin: Skin is warm and dry.  Psychiatric: He has a normal mood and affect.    ED Course  Procedures (including critical care time) Labs Review Labs Reviewed  BASIC METABOLIC PANEL  CBC WITH DIFFERENTIAL   Results for orders placed or performed during the hospital encounter of 62/03/55  Basic metabolic panel  Result Value Ref Range   Sodium 136 135 - 145 mmol/L   Potassium 3.2 (L) 3.5 - 5.1 mmol/L   Chloride 105 96 - 112 mEq/L   CO2 28 19 - 32 mmol/L   Glucose, Bld 112 (H) 70 - 99 mg/dL   BUN 9 6 - 23 mg/dL   Creatinine, Ser 0.93 0.50 - 1.35 mg/dL   Calcium 8.4 8.4 - 10.5 mg/dL   GFR calc non Af Amer >90 >90 mL/min   GFR calc Af Amer >90 >90 mL/min   Anion gap 3 (L) 5 - 15  CBC with Differential  Result Value Ref Range   WBC 6.0 4.0 - 10.5 K/uL   RBC 4.20 (L) 4.22 - 5.81 MIL/uL   Hemoglobin 13.1 13.0 - 17.0 g/dL   HCT 40.1 39.0 - 52.0 %   MCV 95.5 78.0 - 100.0 fL   MCH 31.2 26.0 - 34.0 pg   MCHC 32.7 30.0 - 36.0 g/dL   RDW 13.9 11.5 - 15.5 %   Platelets 165 150 - 400 K/uL   Neutrophils Relative % 68 43 - 77 %   Neutro Abs 4.1 1.7 - 7.7 K/uL   Lymphocytes Relative 19 12 - 46 %   Lymphs Abs 1.1 0.7 - 4.0 K/uL   Monocytes Relative 12 3 - 12 %   Monocytes Absolute 0.7 0.1 - 1.0 K/uL   Eosinophils Relative 1 0 - 5 %   Eosinophils Absolute 0.1 0.0 - 0.7 K/uL   Basophils Relative 0 0 - 1 %   Basophils Absolute 0.0 0.0 - 0.1 K/uL    Imaging Review No results found.   EKG Interpretation None      MDM   Final diagnoses:  Right facial swelling   1. Dental abscess  Dental abscess opened with 18G needle with significant drainage. Increased pain - IV medications ordered. No  leukocytosis. He is on appropriate antibiotics. Will refer to dental clinic.     Dewaine Oats, PA-C 01/20/14 0550  Dewaine Oats, PA-C 01/20/14 [place], MD 01/20/14 [phone]

## 2014-01-20 NOTE — ED Notes (Signed)
Patient reports that he began taking Clindamycin on the evening of 01/18/14 as prescribed for a toothache.  He had no issues on 01/19/14, but woke up with severe swelling to right side of face.  Denies pain.

## 2014-01-20 NOTE — ED Notes (Signed)
Patient transported to CT 

## 2014-01-20 NOTE — ED Notes (Signed)
Pt states his face started swelling on the 6th, he was given clinadamycin on the 5th for dental pain.

## 2014-01-20 NOTE — Discharge Instructions (Signed)
Dental Abscess A dental abscess is a collection of infected fluid (pus) from a bacterial infection in the inner part of the tooth (pulp). It usually occurs at the end of the tooth's root.  CAUSES   Severe tooth decay.  Trauma to the tooth that allows bacteria to enter into the pulp, such as a broken or chipped tooth. SYMPTOMS   Severe pain in and around the infected tooth.  Swelling and redness around the abscessed tooth or in the mouth or face.  Tenderness.  Pus drainage.  Bad breath.  Bitter taste in the mouth.  Difficulty swallowing.  Difficulty opening the mouth.  Nausea.  Vomiting.  Chills.  Swollen neck glands. DIAGNOSIS   A medical and dental history will be taken.  An examination will be performed by tapping on the abscessed tooth.  X-rays may be taken of the tooth to identify the abscess. TREATMENT The goal of treatment is to eliminate the infection. You may be prescribed antibiotic medicine to stop the infection from spreading. A root canal may be performed to save the tooth. If the tooth cannot be saved, it may be pulled (extracted) and the abscess may be drained.  HOME CARE INSTRUCTIONS  Only take over-the-counter or prescription medicines for pain, fever, or discomfort as directed by your caregiver.  Rinse your mouth (gargle) often with salt water ( tsp salt in 8 oz [250 ml] of warm water) to relieve pain or swelling.  Do not drive after taking pain medicine (narcotics).  Do not apply heat to the outside of your face.  Return to your dentist for further treatment as directed. SEEK MEDICAL CARE IF:  Your pain is not helped by medicine.  Your pain is getting worse instead of better. SEEK IMMEDIATE MEDICAL CARE IF:  You have a fever or persistent symptoms for more than 2-3 days.  You have a fever and your symptoms suddenly get worse.  You have chills or a very bad headache.  You have problems breathing or swallowing.  You have trouble  opening your mouth.  You have swelling in the neck or around the eye. Document Released: 12/31/2004 Document Revised: 09/25/2011 Document Reviewed: 04/10/2010 [facility] Patient Information 2015 [place], [place]. This information is not intended to replace advice given to you by your health care provider. Make sure you discuss any questions you have with your health care provider.  Emergency Department Resource Guide 1) Find a Doctor and Pay Out of Pocket Although you won't have to find out who is covered by your insurance plan, it is a good idea to ask around and get recommendations. You will then need to call the office and see if the doctor you have chosen will accept you as a new patient and what types of options they offer for patients who are self-pay. Some doctors offer discounts or will set up payment plans for their patients who do not have insurance, but you will need to ask so you aren't surprised when you get to your appointment.  2) Contact Your Local Health Department Not all health departments have doctors that can see patients for sick visits, but many do, so it is worth a call to see if yours does. If you don't know where your local health department is, you can check in your phone book. The CDC also has a tool to help you locate your state's health department, and many state websites also have listings of all of their local health departments.  3) Find a [employer]  If your illness is not likely to be very severe or complicated, you may want to try a walk in clinic. These are popping up all over the country in pharmacies, drugstores, and shopping centers. They're usually staffed by nurse practitioners or physician assistants that have been trained to treat common illnesses and complaints. They're usually fairly quick and inexpensive. However, if you have serious medical issues or chronic medical problems, these are probably not your best option.  No Primary Care Doctor: - Call  Health Connect at  [phone] - they can help you locate a primary care doctor that  accepts your insurance, provides certain services, etc. - Physician Referral Service- [phone]  Chronic Pain Problems: Organization         Address  Phone   Notes  [facility]  [phone] Patients need to be referred by their primary care doctor.   Medication Assistance: Organization         Address  Phone   Notes  [facility] [address] [place], [place] [address] [phone] --Must be a resident of [facility] -- Must have NO insurance coverage whatsoever (no Medicaid/ Medicare, etc.) -- The pt. MUST have a primary care doctor that directs their care regularly and follows them in the community   [facility]  [phone]   [employer]  [phone]    Agencies that provide inexpensive medical care: Organization         Address  Phone   Notes  [place]  [phone]   [facility]    [phone]   [facility] [place], [place] [address] [phone]   [employer] [address] [place] [phone]   [facility]anned Parenthood    [phone]   [facility]    [phone]   [place] [address], [place] Phone:  [phone], Fax:  [phone] Hours of Operation:  9 am - 6 pm, M-F.  Also accepts Medicaid/Medicare and self-pay.  [facility] for [place], [address], [place] Phone: [phone], Fax: [phone]. Hours of Operation:  8:30 am - 5:30 pm, M-F.  Also accepts Medicaid and self-pay.  [facility] [place] [address] [place] Phone: [phone]   [place], [place], [place] [phone], Ext. [phone] Mondays & Thursdays: 7-9 AM.  First 15 patients are seen on a first come, first serve  basis.    [place] Providers:  Organization         Address  Phone   Notes  [facility] [address],  [phone] Also accepts self-pay patients.  [facility] [address], [place]  [phone]   [place], [address], [place] [phone]   [facility] [address] [place] [phone]   Lucianne Lei [address], [place]   [phone] Only accepts [place] Access [place] patients after they have their name applied to their card.   Self-Pay (no insurance) in [facility]:  Organization         Address  Phone   Notes  Sickle Cell Patients, [facility] [place] [phone])  [facility] [place] [phone]   [facility] [place]  [place], [address], [place] [phone]   [facility]/Dr. Osei-Bonsu  [address] [place] or [address], [place] [phone] Phone number for both [place] and [place] locations is the same.  Urgent Medical and [facility] [address], [place] [phone]   [facility] [address] [place] or [address] [phone]   [facility] [address], [place] [phone], phone; [phone], fax Sees patients 1st and 3rd Saturday of every month.  Must not qualify for public or private insurance (i.e. Medicaid, Medicare, [place] Health Choice, Veterans' Benefits)  Household income should be no more than 200% of the poverty level The clinic cannot treat you if you are pregnant or think you are pregnant  Sexually transmitted diseases are not treated at the clinic.    Dental Care: Organization         Address  Phone  Notes  [facility] Department of [facility] [place] [phone] Accepts children up to age 35 who are enrolled in [place] or [place]; pregnant women with a Medicaid card; and children who have applied for Medicaid or [place] Health Choice, but were declined, whose parents can pay a reduced fee at time of service.  [facility] Department of [facility]  [address], [place] [phone] Accepts children up to age 49 who are enrolled in [place] or [place]; pregnant women with a Medicaid card; and children who have applied for Medicaid or [place] Health Choice, but were declined, whose parents can pay a reduced fee at time of service.  [facility]  [place] [phone] Patients are seen by appointment only. Walk-ins are not accepted. [place] will see patients 70 years of age and older. Monday - Tuesday (8am-5pm) Most Wednesdays (8:30-5pm) $30 per visit, cash only  [facility] Adult Dental Access PROGRAM  [address], [facility] [phone] Patients are seen by appointment only. Walk-ins are not accepted. [place] will see patients 67 years of age and older. One Wednesday Evening (Monthly: Volunteer Based).  $30 per visit, cash only  [place]  [phone] for adults; Children under age 75, call Graduate Pediatric Dentistry at [phone]. Children aged 65-14, please call [phone] to request a pediatric application.  Dental services are provided in all areas of dental care including fillings, crowns and bridges, complete and partial dentures, implants, gum treatment, root canals, and extractions. Preventive care is also provided. Treatment is provided to both adults and children. Patients are selected via a lottery and there is often a waiting list.   [facility] [address] [place]  [phone]  [email or web address]   [facility] [address] [place], [place] [phone], Ext. [phone] Second and Fourth Thursday of each month, opens at 6:30 AM; Clinic ends at 9 AM.  Patients are seen on a first-come first-served basis, and a limited number are seen during each clinic.   [facility]  [address] [place], [place] [phone]   Eligibility Requirements You must have lived in [place], [place], or [place] counties for  at least the last three months.   You cannot be eligible for state or federal sponsored [employer], including [employer], [place], or [employer].   You generally cannot be eligible for healthcare insurance through your employer.    How to apply: Eligibility screenings are held every Tuesday and Wednesday afternoon from 1:00 pm until 4:00 pm. You do not need an appointment for the interview!  [facility] [address] [place], [place]   [place]  Welcome  [place]  [phone]

## 2014-01-20 NOTE — Telephone Encounter (Signed)
[name] from [facility] called stating that she had an appointment scheduled for the patient for dental services but when patient arrived at facility his orange card was expired and was not able to be seen. She stated that patient became very irate, and rude to the staff.

## 2014-01-21 ENCOUNTER — Encounter (HOSPITAL_COMMUNITY): Payer: Self-pay | Admitting: Emergency Medicine

## 2014-01-21 ENCOUNTER — Emergency Department (HOSPITAL_COMMUNITY)
Admission: EM | Admit: 2014-01-21 | Discharge: 2014-01-21 | Disposition: A | Discharge status: Home / Self Care | Payer: No Typology Code available for payment source | Attending: Emergency Medicine | Admitting: Emergency Medicine

## 2014-01-21 DIAGNOSIS — Z79899 Other long term (current) drug therapy: Secondary | ICD-10-CM | POA: Insufficient documentation

## 2014-01-21 DIAGNOSIS — Z880 Allergy status to penicillin: Secondary | ICD-10-CM | POA: Insufficient documentation

## 2014-01-21 DIAGNOSIS — Z720 Tobacco use: Secondary | ICD-10-CM | POA: Insufficient documentation

## 2014-01-21 DIAGNOSIS — Z4801 Encounter for change or removal of surgical wound dressing: Secondary | ICD-10-CM | POA: Insufficient documentation

## 2014-01-21 DIAGNOSIS — Z7951 Long term (current) use of inhaled steroids: Secondary | ICD-10-CM | POA: Insufficient documentation

## 2014-01-21 DIAGNOSIS — J449 Chronic obstructive pulmonary disease, unspecified: Secondary | ICD-10-CM | POA: Insufficient documentation

## 2014-01-21 DIAGNOSIS — Z5189 Encounter for other specified aftercare: Secondary | ICD-10-CM

## 2014-01-21 NOTE — ED Provider Notes (Signed)
CSN: [id number]     Arrival date & time 01/21/14  1439 History  This chart was scribed for non-physician practitioner, Brent General, PA-C working with No att. providers found by Judithann Sauger, ED Scribe. The patient was seen in room WTR7/WTR7 and the patient's care was started at 4:19 PM    Chief Complaint  Patient presents with  . Facial Swelling   The history is provided by the patient. No language interpreter was used.   HPI Comments: [name] is a 59 y.o. male who presents to the Emergency Department for a follow up. He reports that he had a dental abscess drainage with swelling of his right cheek yesterday. He states that the swelling has gone down and he has been compliant with his abx. He reports feeling much better than he did at his last encounter. He denies a follow up with a dentist. He reports an allergy to penicillin and codeine. Patient denies fever, chills, nausea, vomiting.   Past Medical History  Diagnosis Date  . COPD (chronic obstructive pulmonary disease)   . Asthma    Past Surgical History  Procedure Laterality Date  . Hand surgery     Family History  Problem Relation Age of Onset  . Hypertension Paternal Uncle   . Cancer Maternal Grandmother   . Hypertension Maternal Grandfather    History  Substance Use Topics  . Smoking status: Current Some Day Smoker -- 2.00 packs/day for 39 years    Types: Cigarettes  . Smokeless tobacco: Never Used  . Alcohol Use: Yes     Comment: "every once in awhile"    Review of Systems  Constitutional: Negative for fever.  Skin: Negative for color change and rash.      Allergies  Codeine and Penicillins  Home Medications   Prior to Admission medications   Medication Sig Start Date End Date Taking? Authorizing Provider  albuterol (PROVENTIL HFA;VENTOLIN HFA) 108 (90 BASE) MCG/ACT inhaler Inhale 2 puffs into the lungs every 4 (four) hours as needed for wheezing or shortness of breath. 01/18/14  Yes Deepak  Advani, MD  amLODipine (NORVASC) 2.5 MG tablet Take 1 tablet (2.5 mg total) by mouth daily. 10/22/13  Yes Lorayne Marek, MD  clindamycin (CLEOCIN) 150 MG capsule Take 2 capsules (300 mg total) by mouth every 6 (six) hours. 01/20/14  Yes Shari A Upstill, PA-C  diphenhydramine-acetaminophen (TYLENOL PM) 25-500 MG TABS Take 2 tablets by mouth at bedtime as needed (sleep/pain).   Yes Historical Provider, MD  Fluticasone-Salmeterol (ADVAIR) 100-50 MCG/DOSE AEPB Inhale 1 puff into the lungs 2 (two) times daily. 01/18/14  Yes Lorayne Marek, MD  ibuprofen (ADVIL,MOTRIN) 800 MG tablet Take 800 mg by mouth every 8 (eight) hours as needed for headache or moderate pain.   Yes Historical Provider, MD  oxyCODONE-acetaminophen (PERCOCET/ROXICET) 5-325 MG per tablet Take 1-2 tablets by mouth every 4 (four) hours as needed for severe pain. 01/20/14  Yes Shari A Upstill, PA-C  HYDROcodone-acetaminophen (NORCO/VICODIN) 5-325 MG per tablet Take 1-2 tablets by mouth every 4 (four) hours as needed for moderate pain or severe pain. Patient not taking: Reported on 01/20/2014 11/29/13   Clayton Bibles, PA-C  nicotine (NICODERM CQ) 21 mg/24hr patch Place 1 patch (21 mg total) onto the skin daily. Patient not taking: Reported on 01/20/2014 07/15/13   Lorayne Marek, MD  predniSONE (DELTASONE) 20 MG tablet Take 2 tablets (40 mg total) by mouth daily. Patient not taking: Reported on 01/20/2014 08/30/13   Alvina Chou, PA-C  Vitamin D, Ergocalciferol, (DRISDOL) 50000 UNITS CAPS capsule Take 1 capsule (50,000 Units total) by mouth every 7 (seven) days. Patient not taking: Reported on 01/20/2014 09/03/13   Lorayne Marek, MD   BP 165/97 mmHg  Pulse 89  Temp(Src) 98.9 F (37.2 C) (Oral)  Resp 18  SpO2 96% Physical Exam  Constitutional: He is oriented to person, place, and time. He appears well-developed and well-nourished. No distress.  HENT:  Head: Normocephalic and atraumatic.  Mouth/Throat: Uvula is midline, oropharynx is clear and moist  and mucous membranes are normal. No trismus in the jaw. No dental abscesses or uvula swelling. No oropharyngeal exudate, posterior oropharyngeal edema, posterior oropharyngeal erythema or tonsillar abscesses.  Area of mild serous sanguinous discharge in upper right gum line consistent with a abscess that had been drained in the past 48 hours. No purulent discharge, erythema, fluctuance, signs of repeat abscess or cellulitis. Patient's facial swelling has decreased significantly compared to pictures patient has present with him in the room from his previous encounter.  Eyes: Conjunctivae and EOM are normal.  Neck: Neck supple. No tracheal deviation present.  Cardiovascular: Normal rate.   Pulmonary/Chest: Effort normal. No respiratory distress.  Musculoskeletal: Normal range of motion.  Neurological: He is alert and oriented to person, place, and time.  Skin: Skin is warm and dry.  Psychiatric: He has a normal mood and affect. His behavior is normal.  Nursing note and vitals reviewed.   ED Course  Procedures (including critical care time) DIAGNOSTIC STUDIES: Oxygen Saturation is 96% on RA, normal by my interpretation.    COORDINATION OF CARE: 4:23 PM- Pt advised of plan for treatment and pt agrees.    Labs Review Labs Reviewed - No data to display  Imaging Review Ct Maxillofacial W/cm  01/20/2014   CLINICAL DATA:  Right facial swelling after taking clindamycin for a toothache. Toothache is upper right-sided.  EXAM: CT MAXILLOFACIAL WITH CONTRAST  TECHNIQUE: Multidetector CT imaging of the maxillofacial structures was performed with intravenous contrast. Multiplanar CT image reconstructions were also generated. A small metallic BB was placed on the right temple in order to reliably differentiate right from left.  CONTRAST:  119mL OMNIPAQUE IOHEXOL 300 MG/ML  SOLN  COMPARISON:  None.  FINDINGS: There is soft tissue infiltration and edema throughout the right side of the face extending from the  inferior to the right orbit to the right mandible. There is an ovoid fluid collection adjacent to the right side of the maxilla and extending to the bone surface measuring 6 x 24 mm, consistent with focal abscess. There is bone erosion demonstrated around the right maxillary second bicuspid suggesting periodontal abscess. Multiple prior tooth extractions and multiple severe dental caries.  Mucosal thickening throughout the paranasal sinuses, most prominent in the right maxillary antrum. Bone resorption versus postoperative change in the medial maxillary antral walls. No displaced fractures demonstrated in the orbital or facial bones, nasal bones, zygomatic arches, mandibles, or temporomandibular joints. Visualized salivary glands appear symmetrical. Orbits and extraocular muscles appear intact and symmetrical.  IMPRESSION: Soft tissue infiltration and edema throughout the right side of the face consistent with cellulitis. Focal abscess collection adjacent to the right side of the maxilla. Bone erosion demonstrated around the right maxillary second bicuspid suggesting periodontal abscess. Multiple prior tooth extractions and multiple dental caries.   Electronically Signed   By: Lucienne Capers M.D.   On: 01/20/2014 06:14     EKG Interpretation None      MDM   Final diagnoses:  Wound check, abscess    Patient returning for wound recheck after drained dental abscess. Area of drainage is well-appearing, with scant amount of blood, consistent with drainage of an abscess within the past 48 hours. No repeat gross abscess. No signs of cellulitis or worsening of infection. Patient's facial swelling decreased significantly since last visit as compared to pictures he shows me in the exam room on his cell phone from his last encounter. Exam unconcerning for Ludwig's angina or spread of infection. I strongly encouraged patient to continue the prescribed antibiotics he has with him, and strongly encouraged him to  follow-up with dentistry. I discussed return precautions with patient, and patient verbalizes understanding and agreement of this plan. I encouraged patient to call or return to the ER should he have any questions or concerns.  I personally performed the services described in this documentation, which was scribed in my presence. The recorded information has been reviewed and is accurate.   BP 165/97 mmHg  Pulse 89  Temp(Src) 98.9 F (37.2 C) (Oral)  Resp 18  SpO2 96%  Signed,  Dahlia Bailiff, PA-C 5:18 PM   Carrie Mew, PA-C 01/21/14 1718  Nat Christen, MD 01/22/14 1057

## 2014-01-21 NOTE — ED Notes (Signed)
Rt cheek still has a moderate amount of swelling, however the swelling has reduced in sized tremendously from the picture the pt showed me of his initial visit. No visible drainage or incision observed on the inside of his mouth. Oral mucosa pink, not swollen or red.

## 2014-01-21 NOTE — Discharge Instructions (Signed)
Follow-up with dentistry. Return to the ER for any high fever, increasing swelling, if your abscess returns, any difficulty swallowing or breathing.  Dental Abscess A dental abscess is a collection of infected fluid (pus) from a bacterial infection in the inner part of the tooth (pulp). It usually occurs at the end of the tooth's root.  CAUSES   Severe tooth decay.  Trauma to the tooth that allows bacteria to enter into the pulp, such as a broken or chipped tooth. SYMPTOMS   Severe pain in and around the infected tooth.  Swelling and redness around the abscessed tooth or in the mouth or face.  Tenderness.  Pus drainage.  Bad breath.  Bitter taste in the mouth.  Difficulty swallowing.  Difficulty opening the mouth.  Nausea.  Vomiting.  Chills.  Swollen neck glands. DIAGNOSIS   A medical and dental history will be taken.  An examination will be performed by tapping on the abscessed tooth.  X-rays may be taken of the tooth to identify the abscess. TREATMENT The goal of treatment is to eliminate the infection. You may be prescribed antibiotic medicine to stop the infection from spreading. A root canal may be performed to save the tooth. If the tooth cannot be saved, it may be pulled (extracted) and the abscess may be drained.  HOME CARE INSTRUCTIONS  Only take over-the-counter or prescription medicines for pain, fever, or discomfort as directed by your caregiver.  Rinse your mouth (gargle) often with salt water ( tsp salt in 8 oz [250 ml] of warm water) to relieve pain or swelling.  Do not drive after taking pain medicine (narcotics).  Do not apply heat to the outside of your face.  Return to your dentist for further treatment as directed. SEEK MEDICAL CARE IF:  Your pain is not helped by medicine.  Your pain is getting worse instead of better. SEEK IMMEDIATE MEDICAL CARE IF:  You have a fever or persistent symptoms for more than 2-3 days.  You have a fever  and your symptoms suddenly get worse.  You have chills or a very bad headache.  You have problems breathing or swallowing.  You have trouble opening your mouth.  You have swelling in the neck or around the eye. Document Released: 12/31/2004 Document Revised: 09/25/2011 Document Reviewed: 04/10/2010 [facility] Patient Information 2015 [place], [place]. This information is not intended to replace advice given to you by your health care provider. Make sure you discuss any questions you have with your health care provider.

## 2014-01-21 NOTE — ED Notes (Signed)
Pt here yesterday for R jaw swelling. Abscess aspirated at that time. Was told to return for recheck. Pt reports swelling has been decreasing since then. Has been taking po antibiotics as prescribed.

## 2014-02-10 ENCOUNTER — Ambulatory Visit: Payer: Self-pay

## 2014-02-11 ENCOUNTER — Ambulatory Visit: Payer: Self-pay | Attending: Internal Medicine

## 2014-04-20 ENCOUNTER — Ambulatory Visit: Payer: Self-pay | Attending: Internal Medicine | Admitting: Internal Medicine

## 2014-04-20 ENCOUNTER — Encounter: Payer: Self-pay | Admitting: Internal Medicine

## 2014-04-20 VITALS — BP 156/95 | HR 91 | Temp 98.0°F | Resp 16 | Wt 240.0 lb

## 2014-04-20 DIAGNOSIS — I10 Essential (primary) hypertension: Secondary | ICD-10-CM | POA: Insufficient documentation

## 2014-04-20 DIAGNOSIS — J45909 Unspecified asthma, uncomplicated: Secondary | ICD-10-CM | POA: Insufficient documentation

## 2014-04-20 DIAGNOSIS — J449 Chronic obstructive pulmonary disease, unspecified: Secondary | ICD-10-CM | POA: Insufficient documentation

## 2014-04-20 DIAGNOSIS — Z7952 Long term (current) use of systemic steroids: Secondary | ICD-10-CM | POA: Insufficient documentation

## 2014-04-20 DIAGNOSIS — Z720 Tobacco use: Secondary | ICD-10-CM

## 2014-04-20 DIAGNOSIS — Z792 Long term (current) use of antibiotics: Secondary | ICD-10-CM | POA: Insufficient documentation

## 2014-04-20 DIAGNOSIS — F17200 Nicotine dependence, unspecified, uncomplicated: Secondary | ICD-10-CM

## 2014-04-20 DIAGNOSIS — Z7951 Long term (current) use of inhaled steroids: Secondary | ICD-10-CM | POA: Insufficient documentation

## 2014-04-20 DIAGNOSIS — F17210 Nicotine dependence, cigarettes, uncomplicated: Secondary | ICD-10-CM | POA: Insufficient documentation

## 2014-04-20 LAB — COMPLETE METABOLIC PANEL WITH GFR
ALK PHOS: 90 U/L (ref 39–117)
ALT: 21 U/L (ref 0–53)
AST: 22 U/L (ref 0–37)
Albumin: 4.2 g/dL (ref 3.5–5.2)
BUN: 14 mg/dL (ref 6–23)
CHLORIDE: 104 meq/L (ref 96–112)
CO2: 28 mEq/L (ref 19–32)
Calcium: 8.9 mg/dL (ref 8.4–10.5)
Creat: 0.9 mg/dL (ref 0.50–1.35)
GFR, Est African American: >89 mL/min
GFR, Est Non African American: >89 mL/min
Glucose, Bld: 97 mg/dL (ref 70–99)
POTASSIUM: 4.6 meq/L (ref 3.5–5.3)
SODIUM: 141 meq/L (ref 135–145)
TOTAL PROTEIN: 7.2 g/dL (ref 6.0–8.3)
Total Bilirubin: 0.6 mg/dL (ref 0.2–1.2)

## 2014-04-20 MED ORDER — AMLODIPINE BESYLATE 5 MG PO TABS: 5.0000 mg | ORAL_TABLET | Freq: Every day | ORAL | Status: DC

## 2014-04-20 MED ORDER — FLUTICASONE-SALMETEROL 100-50 MCG/DOSE IN AEPB: 1.0000 | INHALATION_SPRAY | Freq: Two times a day (BID) | RESPIRATORY_TRACT | Status: DC

## 2014-04-20 MED ORDER — ALBUTEROL SULFATE HFA 108 (90 BASE) MCG/ACT IN AERS: 2.0000 | INHALATION_SPRAY | RESPIRATORY_TRACT | Status: DC | PRN

## 2014-04-20 NOTE — Progress Notes (Signed)
MRN: [id number] Name: [name]  Sex: male Age: 59 y.o. DOB: 06/05/1955  Allergies: Codeine and Penicillins  Chief Complaint  Patient presents with  . Follow-up    HPI: Patient is 59 y.o. male who history of COPD, hypertension, comes today for followup requesting refill on his medications, as per patient he is requiring more often to use albuterol when he exercises, patient also has been smoking cigarettes, I have again counseled patient to quit smoking currently denies any fever chills chest and shortness of breath, today's blood pressure is elevated as per patient he has taken his medication.  Past Medical History  Diagnosis Date  . COPD (chronic obstructive pulmonary disease)   . Asthma     Past Surgical History  Procedure Laterality Date  . Hand surgery        Medication List       This list is accurate as of: 04/20/14 11:21 AM.  Always use your most recent med list.               albuterol 108 (90 BASE) MCG/ACT inhaler  Commonly known as:  PROVENTIL HFA;VENTOLIN HFA  Inhale 2 puffs into the lungs every 4 (four) hours as needed for wheezing or shortness of breath.     amLODipine 5 MG tablet  Commonly known as:  NORVASC  Take 1 tablet (5 mg total) by mouth daily.     clindamycin 150 MG capsule  Commonly known as:  CLEOCIN  Take 2 capsules (300 mg total) by mouth every 6 (six) hours.     diphenhydramine-acetaminophen 25-500 MG Tabs  Commonly known as:  TYLENOL PM  Take 2 tablets by mouth at bedtime as needed (sleep/pain).     Fluticasone-Salmeterol 100-50 MCG/DOSE Aepb  Commonly known as:  ADVAIR  Inhale 1 puff into the lungs 2 (two) times daily.     HYDROcodone-acetaminophen 5-325 MG per tablet  Commonly known as:  NORCO/VICODIN  Take 1-2 tablets by mouth every 4 (four) hours as needed for moderate pain or severe pain.     ibuprofen 800 MG tablet  Commonly known as:  ADVIL,MOTRIN  Take 800 mg by mouth every 8 (eight) hours as needed for  headache or moderate pain.     nicotine 21 mg/24hr patch  Commonly known as:  NICODERM CQ  Place 1 patch (21 mg total) onto the skin daily.     oxyCODONE-acetaminophen 5-325 MG per tablet  Commonly known as:  PERCOCET/ROXICET  Take 1-2 tablets by mouth every 4 (four) hours as needed for severe pain.     predniSONE 20 MG tablet  Commonly known as:  DELTASONE  Take 2 tablets (40 mg total) by mouth daily.     Vitamin D (Ergocalciferol) 50000 UNITS Caps capsule  Commonly known as:  DRISDOL  Take 1 capsule (50,000 Units total) by mouth every 7 (seven) days.        Meds ordered this encounter  Medications  . albuterol (PROVENTIL HFA;VENTOLIN HFA) 108 (90 BASE) MCG/ACT inhaler    Sig: Inhale 2 puffs into the lungs every 4 (four) hours as needed for wheezing or shortness of breath.    Dispense:  3 Inhaler    Refill:  3  . Fluticasone-Salmeterol (ADVAIR) 100-50 MCG/DOSE AEPB    Sig: Inhale 1 puff into the lungs 2 (two) times daily.    Dispense:  3 each    Refill:  3  . amLODipine (NORVASC) 5 MG tablet    Sig: Take 1  tablet (5 mg total) by mouth daily.    Dispense:  30 tablet    Refill:  3     There is no immunization history on file for this patient.  Family History  Problem Relation Age of Onset  . Hypertension Paternal Uncle   . Cancer Maternal Grandmother   . Hypertension Maternal Grandfather     History  Substance Use Topics  . Smoking status: Current Some Day Smoker -- 2.00 packs/day for 39 years    Types: Cigarettes  . Smokeless tobacco: Never Used  . Alcohol Use: Yes     Comment: "every once in awhile"    Review of Systems   As noted in HPI  Filed Vitals:   04/20/14 0930  BP: 156/95  Pulse: 91  Temp: 98 F (36.7 C)  Resp: 16    Physical Exam  Physical Exam  Constitutional: No distress.  Eyes: EOM are normal. Pupils are equal, round, and reactive to light.  Cardiovascular: Normal rate and regular rhythm.   Pulmonary/Chest: Breath sounds normal.  No respiratory distress. He has no wheezes. He has no rales.  Musculoskeletal: He exhibits no edema.    CBC    Component Value Date/Time   WBC 6.0 01/20/2014 0450   RBC 4.20* 01/20/2014 0450   HGB 13.1 01/20/2014 0450   HCT 40.1 01/20/2014 0450   PLT 165 01/20/2014 0450   MCV 95.5 01/20/2014 0450   LYMPHSABS 1.1 01/20/2014 0450   MONOABS 0.7 01/20/2014 0450   EOSABS 0.1 01/20/2014 0450   BASOSABS 0.0 01/20/2014 0450    CMP     Component Value Date/Time   NA 136 01/20/2014 0450   K 3.2* 01/20/2014 0450   CL 105 01/20/2014 0450   CO2 28 01/20/2014 0450   GLUCOSE 112* 01/20/2014 0450   BUN 9 01/20/2014 0450   CREATININE 0.93 01/20/2014 0450   CREATININE 0.88 07/15/2013 1152   CALCIUM 8.4 01/20/2014 0450   PROT 6.9 07/15/2013 1152   ALBUMIN 4.0 07/15/2013 1152   AST 16 07/15/2013 1152   ALT 17 07/15/2013 1152   ALKPHOS 83 07/15/2013 1152   BILITOT 0.8 07/15/2013 1152   GFRNONAA >90 01/20/2014 0450   GFRNONAA >89 07/15/2013 1152   GFRAA >90 01/20/2014 0450   GFRAA >89 07/15/2013 1152    Lab Results  Component Value Date/Time   CHOL 184 07/15/2013 11:52 AM    No components found for: HGA1C  Lab Results  Component Value Date/Time   AST 16 07/15/2013 11:52 AM    Assessment and Plan  COPD mixed type - Plan: patient will continue with albuterol, Advair, consultation to quit smoking albuterol (PROVENTIL HFA;VENTOLIN HFA) 108 (90 BASE) MCG/ACT inhaler, Fluticasone-Salmeterol (ADVAIR) 100-50 MCG/DOSE AEPB, COMPLETE METABOLIC PANEL WITH GFR  Essential hypertension - Plan:blood pressure is uncontrolled, advise patient for DASH diet, increased the dose of amlodipine  amLODipine (NORVASC) 5 MG tablet, COMPLETE METABOLIC PANEL WITH GFR  Smoking Patient will try to quit smoking    Return in about 3 months (around 07/20/2014) for hypertension.   This note has been created with Surveyor, quantity. Any transcriptional errors are  unintentional.    Lorayne Marek, MD

## 2014-04-20 NOTE — Progress Notes (Signed)
Patient here for follow up on his hypertension And copd.  Patient states he needs another rescue inhaler He finds he is running out of his mid way through the month Patient states in walking here this am( about 2 miles) used his inhaler three times

## 2014-04-20 NOTE — Patient Instructions (Signed)
Smoking Cessation Quitting smoking is important to your health and has many advantages. However, it is not always easy to quit since nicotine is a very addictive drug. Oftentimes, people try 3 times or more before being able to quit. This document explains the best ways for you to prepare to quit smoking. Quitting takes hard work and a lot of effort, but you can do it. ADVANTAGES OF QUITTING SMOKING  You will live longer, feel better, and live better.  Your body will feel the impact of quitting smoking almost immediately.  Within 20 minutes, blood pressure decreases. Your pulse returns to its normal level.  After 8 hours, carbon monoxide levels in the blood return to normal. Your oxygen level increases.  After 24 hours, the chance of having a heart attack starts to decrease. Your breath, hair, and body stop smelling like smoke.  After 48 hours, damaged nerve endings begin to recover. Your sense of taste and smell improve.  After 72 hours, the body is virtually free of nicotine. Your bronchial tubes relax and breathing becomes easier.  After 2 to 12 weeks, lungs can hold more air. Exercise becomes easier and circulation improves.  The risk of having a heart attack, stroke, cancer, or lung disease is greatly reduced.  After 1 year, the risk of coronary heart disease is cut in half.  After 5 years, the risk of stroke falls to the same as a nonsmoker.  After 10 years, the risk of lung cancer is cut in half and the risk of other cancers decreases significantly.  After 15 years, the risk of coronary heart disease drops, usually to the level of a nonsmoker.  If you are pregnant, quitting smoking will improve your chances of having a healthy baby.  The people you live with, especially any children, will be healthier.  You will have extra money to spend on things other than cigarettes. QUESTIONS TO THINK ABOUT BEFORE ATTEMPTING TO QUIT You may want to talk about your answers with your  health care provider.  Why do you want to quit?  If you tried to quit in the past, what helped and what did not?  What will be the most difficult situations for you after you quit? How will you plan to handle them?  Who can help you through the tough times? Your family? Friends? A health care provider?  What pleasures do you get from smoking? What ways can you still get pleasure if you quit? Here are some questions to ask your health care provider:  How can you help me to be successful at quitting?  What medicine do you think would be best for me and how should I take it?  What should I do if I need more help?  What is smoking withdrawal like? How can I get information on withdrawal? GET READY  Set a quit date.  Change your environment by getting rid of all cigarettes, ashtrays, matches, and lighters in your home, car, or work. Do not let people smoke in your home.  Review your past attempts to quit. Think about what worked and what did not. GET SUPPORT AND ENCOURAGEMENT You have a better chance of being successful if you have help. You can get support in many ways.  Tell your family, friends, and coworkers that you are going to quit and need their support. Ask them not to smoke around you.  Get individual, group, or telephone counseling and support. Programs are available at local hospitals and health centers. Call   your local health department for information about programs in your area.  Spiritual beliefs and practices may help some smokers quit.  Download a "quit meter" on your computer to keep track of quit statistics, such as how long you have gone without smoking, cigarettes not smoked, and money saved.  Get a self-help book about quitting smoking and staying off tobacco. LEARN NEW SKILLS AND BEHAVIORS  Distract yourself from urges to smoke. Talk to someone, go for a walk, or occupy your time with a task.  Change your normal routine. Take a different route to work.  Drink tea instead of coffee. Eat breakfast in a different place.  Reduce your stress. Take a hot bath, exercise, or read a book.  Plan something enjoyable to do every day. Reward yourself for not smoking.  Explore interactive web-based programs that specialize in helping you quit. GET MEDICINE AND USE IT CORRECTLY Medicines can help you stop smoking and decrease the urge to smoke. Combining medicine with the above behavioral methods and support can greatly increase your chances of successfully quitting smoking.  Nicotine replacement therapy helps deliver nicotine to your body without the negative effects and risks of smoking. Nicotine replacement therapy includes nicotine gum, lozenges, inhalers, nasal sprays, and skin patches. Some may be available over-the-counter and others require a prescription.  Antidepressant medicine helps people abstain from smoking, but how this works is unknown. This medicine is available by prescription.  Nicotinic receptor partial agonist medicine simulates the effect of nicotine in your brain. This medicine is available by prescription. Ask your health care provider for advice about which medicines to use and how to use them based on your health history. Your health care provider will tell you what side effects to look out for if you choose to be on a medicine or therapy. Carefully read the information on the package. Do not use any other product containing nicotine while using a nicotine replacement product.  RELAPSE OR DIFFICULT SITUATIONS Most relapses occur within the first 3 months after quitting. Do not be discouraged if you start smoking again. Remember, most people try several times before finally quitting. You may have symptoms of withdrawal because your body is used to nicotine. You may crave cigarettes, be irritable, feel very hungry, cough often, get headaches, or have difficulty concentrating. The withdrawal symptoms are only temporary. They are strongest  when you first quit, but they will go away within 10-14 days. To reduce the chances of relapse, try to:  Avoid drinking alcohol. Drinking lowers your chances of successfully quitting.  Reduce the amount of caffeine you consume. Once you quit smoking, the amount of caffeine in your body increases and can give you symptoms, such as a rapid heartbeat, sweating, and anxiety.  Avoid smokers because they can make you want to smoke.  Do not let weight gain distract you. Many smokers will gain weight when they quit, usually less than 10 pounds. Eat a healthy diet and stay active. You can always lose the weight gained after you quit.  Find ways to improve your mood other than smoking. FOR MORE INFORMATION  www.smokefree.gov  Document Released: 12/25/2000 Document Revised: 05/17/2013 Document Reviewed: 04/11/2011 ExitCare Patient Information 2015 [employer]. This information is not intended to replace advice given to you by your health care provider. Make sure you discuss any questions you have with your health care provider. DASH Eating Plan DASH stands for "Dietary Approaches to Stop Hypertension." The DASH eating plan is a healthy eating plan that has   been shown to reduce high blood pressure (hypertension). Additional health benefits may include reducing the risk of type 2 diabetes mellitus, heart disease, and stroke. The DASH eating plan may also help with weight loss. WHAT DO I NEED TO KNOW ABOUT THE DASH EATING PLAN? For the DASH eating plan, you will follow these general guidelines:  Choose foods with a percent daily value for sodium of less than 5% (as listed on the food label).  Use salt-free seasonings or herbs instead of table salt or sea salt.  Check with your health care provider or pharmacist before using salt substitutes.  Eat lower-sodium products, often labeled as "lower sodium" or "no salt added."  Eat fresh foods.  Eat more vegetables, fruits, and low-fat dairy  products.  Choose whole grains. Look for the word "whole" as the first word in the ingredient list.  Choose fish and skinless chicken or turkey more often than red meat. Limit fish, poultry, and meat to 6 oz (170 g) each day.  Limit sweets, desserts, sugars, and sugary drinks.  Choose heart-healthy fats.  Limit cheese to 1 oz (28 g) per day.  Eat more home-cooked food and less restaurant, buffet, and fast food.  Limit fried foods.  Cook foods using methods other than frying.  Limit canned vegetables. If you do use them, rinse them well to decrease the sodium.  When eating at a restaurant, ask that your food be prepared with less salt, or no salt if possible. WHAT FOODS CAN I EAT? Seek help from a dietitian for individual calorie needs. Grains Whole grain or whole wheat bread. Brown rice. Whole grain or whole wheat pasta. Quinoa, bulgur, and whole grain cereals. Low-sodium cereals. Corn or whole wheat flour tortillas. Whole grain cornbread. Whole grain crackers. Low-sodium crackers. Vegetables Fresh or frozen vegetables (raw, steamed, roasted, or grilled). Low-sodium or reduced-sodium tomato and vegetable juices. Low-sodium or reduced-sodium tomato sauce and paste. Low-sodium or reduced-sodium canned vegetables.  Fruits All fresh, canned (in natural juice), or frozen fruits. Meat and Other Protein Products Ground beef (85% or leaner), grass-fed beef, or beef trimmed of fat. Skinless chicken or turkey. Ground chicken or turkey. Pork trimmed of fat. All fish and seafood. Eggs. Dried beans, peas, or lentils. Unsalted nuts and seeds. Unsalted canned beans. Dairy Low-fat dairy products, such as skim or 1% milk, 2% or reduced-fat cheeses, low-fat ricotta or cottage cheese, or plain low-fat yogurt. Low-sodium or reduced-sodium cheeses. Fats and Oils Tub margarines without trans fats. Light or reduced-fat mayonnaise and salad dressings (reduced sodium). Avocado. Safflower, olive, or canola  oils. Natural peanut or almond butter. Other Unsalted popcorn and pretzels. The items listed above may not be a complete list of recommended foods or beverages. Contact your dietitian for more options. WHAT FOODS ARE NOT RECOMMENDED? Grains White bread. White pasta. White rice. Refined cornbread. Bagels and croissants. Crackers that contain trans fat. Vegetables Creamed or fried vegetables. Vegetables in a cheese sauce. Regular canned vegetables. Regular canned tomato sauce and paste. Regular tomato and vegetable juices. Fruits Dried fruits. Canned fruit in light or heavy syrup. Fruit juice. Meat and Other Protein Products Fatty cuts of meat. Ribs, chicken wings, bacon, sausage, bologna, salami, chitterlings, fatback, hot dogs, bratwurst, and packaged luncheon meats. Salted nuts and seeds. Canned beans with salt. Dairy Whole or 2% milk, cream, half-and-half, and cream cheese. Whole-fat or sweetened yogurt. Full-fat cheeses or blue cheese. Nondairy creamers and whipped toppings. Processed cheese, cheese spreads, or cheese curds. Condiments Onion and garlic salt,   seasoned salt, table salt, and sea salt. Canned and packaged gravies. Worcestershire sauce. Tartar sauce. Barbecue sauce. Teriyaki sauce. Soy sauce, including reduced sodium. Steak sauce. Fish sauce. Oyster sauce. Cocktail sauce. Horseradish. Ketchup and mustard. Meat flavorings and tenderizers. Bouillon cubes. Hot sauce. Tabasco sauce. Marinades. Taco seasonings. Relishes. Fats and Oils Butter, stick margarine, lard, shortening, ghee, and bacon fat. Coconut, palm kernel, or palm oils. Regular salad dressings. Other Pickles and olives. Salted popcorn and pretzels. The items listed above may not be a complete list of foods and beverages to avoid. Contact your dietitian for more information. WHERE CAN I FIND MORE INFORMATION? National Heart, Lung, and [employer] Institute: www.nhlbi.nih.gov/health/health-topics/topics/dash/ Document Released:  12/20/2010 Document Revised: 05/17/2013 Document Reviewed: 11/04/2012 ExitCare Patient Information 2015 [employer]. This information is not intended to replace advice given to you by your health care provider. Make sure you discuss any questions you have with your health care provider.  

## 2014-04-22 ENCOUNTER — Telehealth: Payer: Self-pay

## 2014-04-22 NOTE — Telephone Encounter (Signed)
-----   Message from Lorayne Marek, MD sent at 04/21/2014  9:19 AM EDT ----- Call and let the Patient know that blood work is normal.

## 2014-04-22 NOTE — Telephone Encounter (Signed)
Patient is aware of his lab results 

## 2014-05-28 ENCOUNTER — Emergency Department (HOSPITAL_COMMUNITY)
Admission: EM | Admit: 2014-05-28 | Discharge: 2014-05-28 | Disposition: A | Discharge status: Home / Self Care | Payer: Self-pay | Attending: Emergency Medicine | Admitting: Emergency Medicine

## 2014-05-28 ENCOUNTER — Emergency Department (HOSPITAL_COMMUNITY): Payer: Self-pay

## 2014-05-28 ENCOUNTER — Encounter (HOSPITAL_COMMUNITY): Payer: Self-pay | Admitting: Emergency Medicine

## 2014-05-28 DIAGNOSIS — R000 Tachycardia, unspecified: Secondary | ICD-10-CM | POA: Insufficient documentation

## 2014-05-28 DIAGNOSIS — J159 Unspecified bacterial pneumonia: Secondary | ICD-10-CM | POA: Insufficient documentation

## 2014-05-28 DIAGNOSIS — J441 Chronic obstructive pulmonary disease with (acute) exacerbation: Secondary | ICD-10-CM | POA: Insufficient documentation

## 2014-05-28 DIAGNOSIS — R0602 Shortness of breath: Secondary | ICD-10-CM

## 2014-05-28 DIAGNOSIS — J189 Pneumonia, unspecified organism: Secondary | ICD-10-CM

## 2014-05-28 DIAGNOSIS — Z79899 Other long term (current) drug therapy: Secondary | ICD-10-CM | POA: Insufficient documentation

## 2014-05-28 DIAGNOSIS — Z720 Tobacco use: Secondary | ICD-10-CM | POA: Insufficient documentation

## 2014-05-28 DIAGNOSIS — Z7951 Long term (current) use of inhaled steroids: Secondary | ICD-10-CM | POA: Insufficient documentation

## 2014-05-28 DIAGNOSIS — I10 Essential (primary) hypertension: Secondary | ICD-10-CM | POA: Insufficient documentation

## 2014-05-28 DIAGNOSIS — Z880 Allergy status to penicillin: Secondary | ICD-10-CM | POA: Insufficient documentation

## 2014-05-28 DIAGNOSIS — Z7952 Long term (current) use of systemic steroids: Secondary | ICD-10-CM | POA: Insufficient documentation

## 2014-05-28 DIAGNOSIS — R319 Hematuria, unspecified: Secondary | ICD-10-CM | POA: Insufficient documentation

## 2014-05-28 LAB — CBC WITH DIFFERENTIAL/PLATELET
Basophils Absolute: 0 10*3/uL (ref 0.0–0.1)
Basophils Relative: 0 % (ref 0–1)
EOS PCT: 0 % (ref 0–5)
Eosinophils Absolute: 0 10*3/uL (ref 0.0–0.7)
HEMATOCRIT: 39.1 % (ref 39.0–52.0)
Hemoglobin: 13.5 g/dL (ref 13.0–17.0)
LYMPHS PCT: 11 % — AB (ref 12–46)
Lymphs Abs: 0.9 10*3/uL (ref 0.7–4.0)
MCH: 32 pg (ref 26.0–34.0)
MCHC: 34.5 g/dL (ref 30.0–36.0)
MCV: 92.7 fL (ref 78.0–100.0)
MONOS PCT: 16 % — AB (ref 3–12)
Monocytes Absolute: 1.4 10*3/uL — ABNORMAL HIGH (ref 0.1–1.0)
Neutro Abs: 6.3 10*3/uL (ref 1.7–7.7)
Neutrophils Relative %: 73 % (ref 43–77)
Platelets: 161 10*3/uL (ref 150–400)
RBC: 4.22 MIL/uL (ref 4.22–5.81)
RDW: 13.4 % (ref 11.5–15.5)
WBC: 8.7 10*3/uL (ref 4.0–10.5)

## 2014-05-28 LAB — I-STAT CG4 LACTIC ACID, ED
LACTIC ACID, VENOUS: 1.04 mmol/L (ref 0.5–2.0)
Lactic Acid, Venous: 1.07 mmol/L (ref 0.5–2.0)

## 2014-05-28 LAB — COMPREHENSIVE METABOLIC PANEL
ALT: 22 U/L (ref 17–63)
ANION GAP: 10 (ref 5–15)
AST: 26 U/L (ref 15–41)
Albumin: 3.7 g/dL (ref 3.5–5.0)
Alkaline Phosphatase: 81 U/L (ref 38–126)
BUN: 13 mg/dL (ref 6–20)
CALCIUM: 8.6 mg/dL — AB (ref 8.9–10.3)
CHLORIDE: 103 mmol/L (ref 101–111)
CO2: 25 mmol/L (ref 22–32)
Creatinine, Ser: 1 mg/dL (ref 0.61–1.24)
GFR calc Af Amer: >60 mL/min (ref >60)
GFR calc non Af Amer: >60 mL/min (ref >60)
Glucose, Bld: 117 mg/dL — ABNORMAL HIGH (ref 65–99)
Potassium: 3.6 mmol/L (ref 3.5–5.1)
Sodium: 138 mmol/L (ref 135–145)
TOTAL PROTEIN: 7.6 g/dL (ref 6.5–8.1)
Total Bilirubin: 1 mg/dL (ref 0.3–1.2)

## 2014-05-28 LAB — URINALYSIS, ROUTINE W REFLEX MICROSCOPIC
BILIRUBIN URINE: NEGATIVE
Glucose, UA: NEGATIVE mg/dL
Ketones, ur: NEGATIVE mg/dL
Leukocytes, UA: NEGATIVE
Nitrite: NEGATIVE
PH: 6 (ref 5.0–8.0)
Protein, ur: NEGATIVE mg/dL
SPECIFIC GRAVITY, URINE: 1.011 (ref 1.005–1.030)
Urobilinogen, UA: 1 mg/dL (ref 0.0–1.0)

## 2014-05-28 LAB — URINE MICROSCOPIC-ADD ON

## 2014-05-28 MED ORDER — LEVOFLOXACIN 500 MG PO TABS: 500.0000 mg | ORAL_TABLET | Freq: Every day | ORAL | Status: DC

## 2014-05-28 MED ORDER — SODIUM CHLORIDE 0.9 % IV BOLUS (SEPSIS)
1000.0000 mL | Freq: Once | INTRAVENOUS | Status: AC
  Administered 2014-05-28: 1000 mL via INTRAVENOUS

## 2014-05-28 MED ORDER — ACETAMINOPHEN 325 MG PO TABS
650.0000 mg | ORAL_TABLET | Freq: Four times a day (QID) | ORAL | Status: DC | PRN
  Administered 2014-05-28: 650 mg via ORAL
  Filled 2014-05-28: qty 2

## 2014-05-28 MED ORDER — IPRATROPIUM BROMIDE 0.02 % IN SOLN
0.5000 mg | Freq: Once | RESPIRATORY_TRACT | Status: AC
  Administered 2014-05-28: 0.5 mg via RESPIRATORY_TRACT
  Filled 2014-05-28: qty 2.5

## 2014-05-28 MED ORDER — ALBUTEROL SULFATE HFA 108 (90 BASE) MCG/ACT IN AERS: 2.0000 | INHALATION_SPRAY | RESPIRATORY_TRACT | Status: DC | PRN

## 2014-05-28 NOTE — ED Notes (Signed)
Nurse will get blood cultures. 

## 2014-05-28 NOTE — Discharge Instructions (Signed)

## 2014-05-28 NOTE — ED Notes (Signed)
Patient refused to be weighed and refused to state estimated weight.

## 2014-05-28 NOTE — ED Provider Notes (Signed)
CSN: [id number]     Arrival date & time 05/28/14  1402 History   First MD Initiated Contact with Patient 05/28/14 1436     Chief Complaint  Patient presents with  . Chills  . Fever  . Hematuria     (Consider location/radiation/quality/duration/timing/severity/associated sxs/prior Treatment) Patient is a 59 y.o. male presenting with fever and hematuria. The history is provided by the patient.  Fever Associated symptoms: chills, cough and diarrhea   Associated symptoms: no chest pain, no headaches, no nausea, no rash and no vomiting   Hematuria Associated symptoms include shortness of breath. Pertinent negatives include no chest pain, no abdominal pain and no headaches.   patient presents with fever cough and feeling bad. Began around 4 days ago. States feels fatigued. He's had a cough with some green sputum production. He states he also had a little bit of blood in his urine. No nausea vomiting but has had some diarrhea. No sick contacts. No blood in the emesis. He has a history of COPD. No relief with TheraFlu  Past Medical History  Diagnosis Date  . COPD (chronic obstructive pulmonary disease)   . Asthma   . Hypertension    Past Surgical History  Procedure Laterality Date  . Hand surgery     Family History  Problem Relation Age of Onset  . Hypertension Paternal Uncle   . Cancer Maternal Grandmother   . Hypertension Maternal Grandfather    History  Substance Use Topics  . Smoking status: Current Some Day Smoker -- 2.00 packs/day for 39 years    Types: Cigarettes  . Smokeless tobacco: Never Used  . Alcohol Use: Yes     Comment: "every once in awhile"    Review of Systems  Constitutional: Positive for fever, chills, appetite change and fatigue. Negative for activity change.  Eyes: Negative for pain.  Respiratory: Positive for cough and shortness of breath. Negative for chest tightness.   Cardiovascular: Negative for chest pain and leg swelling.  Gastrointestinal:  Positive for diarrhea. Negative for nausea, vomiting and abdominal pain.  Genitourinary: Positive for hematuria. Negative for flank pain.  Musculoskeletal: Negative for back pain and neck stiffness.  Skin: Negative for rash.  Neurological: Negative for weakness, numbness and headaches.  Psychiatric/Behavioral: Negative for behavioral problems.      Allergies  Codeine and Penicillins  Home Medications   Prior to Admission medications   Medication Sig Start Date End Date Taking? Authorizing Provider  albuterol (PROVENTIL HFA;VENTOLIN HFA) 108 (90 BASE) MCG/ACT inhaler Inhale 2 puffs into the lungs every 4 (four) hours as needed for wheezing or shortness of breath. 04/20/14  Yes Deepak Advani, MD  amLODipine (NORVASC) 5 MG tablet Take 1 tablet (5 mg total) by mouth daily. 04/20/14  Yes Deepak Advani, MD  Fluticasone-Salmeterol (ADVAIR) 100-50 MCG/DOSE AEPB Inhale 1 puff into the lungs 2 (two) times daily. 04/20/14  Yes Lorayne Marek, MD  Phenylephrine-Pheniramine-DM (THERAFLU COLD & COUGH PO) Take 1 packet by mouth every 6 (six) hours as needed (cold symptoms).   Yes Historical Provider, MD  clindamycin (CLEOCIN) 150 MG capsule Take 2 capsules (300 mg total) by mouth every 6 (six) hours. Patient not taking: Reported on 05/28/2014 01/20/14   Charlann Lange, PA-C  HYDROcodone-acetaminophen (NORCO/VICODIN) 5-325 MG per tablet Take 1-2 tablets by mouth every 4 (four) hours as needed for moderate pain or severe pain. Patient not taking: Reported on 01/20/2014 11/29/13   Clayton Bibles, PA-C  levofloxacin (LEVAQUIN) 500 MG tablet Take 1 tablet (500 mg  total) by mouth daily. 05/28/14   Davonna Belling, MD  nicotine (NICODERM CQ) 21 mg/24hr patch Place 1 patch (21 mg total) onto the skin daily. Patient not taking: Reported on 01/20/2014 07/15/13   Lorayne Marek, MD  oxyCODONE-acetaminophen (PERCOCET/ROXICET) 5-325 MG per tablet Take 1-2 tablets by mouth every 4 (four) hours as needed for severe pain. Patient not  taking: Reported on 05/28/2014 01/20/14   Charlann Lange, PA-C  predniSONE (DELTASONE) 20 MG tablet Take 2 tablets (40 mg total) by mouth daily. Patient not taking: Reported on 01/20/2014 08/30/13   Alvina Chou, PA-C  Vitamin D, Ergocalciferol, (DRISDOL) 50000 UNITS CAPS capsule Take 1 capsule (50,000 Units total) by mouth every 7 (seven) days. Patient not taking: Reported on 01/20/2014 09/03/13   Lorayne Marek, MD   BP 144/86 mmHg  Pulse 101  Temp(Src) 98.6 F (37 C) (Oral)  Resp 18  SpO2 98% Physical Exam  Constitutional: He appears well-developed.  Neck: Neck supple.  Cardiovascular:  Tachycardia  Pulmonary/Chest: He has wheezes.  Diffuse harsh wheezes  Abdominal: Soft. There is no tenderness. There is no rebound.  Musculoskeletal: Normal range of motion.  Neurological: He is alert.  Skin: Skin is warm.    ED Course  Procedures (including critical care time) Labs Review Labs Reviewed  CBC WITH DIFFERENTIAL/PLATELET - Abnormal; Notable for the following:    Lymphocytes Relative 11 (*)    Monocytes Relative 16 (*)    Monocytes Absolute 1.4 (*)    All other components within normal limits  COMPREHENSIVE METABOLIC PANEL - Abnormal; Notable for the following:    Glucose, Bld 117 (*)    Calcium 8.6 (*)    All other components within normal limits  URINALYSIS, ROUTINE W REFLEX MICROSCOPIC - Abnormal; Notable for the following:    APPearance CLOUDY (*)    Hgb urine dipstick MODERATE (*)    All other components within normal limits  CULTURE, BLOOD (ROUTINE X 2)  CULTURE, BLOOD (ROUTINE X 2)  URINE CULTURE  URINE MICROSCOPIC-ADD ON  I-STAT CG4 LACTIC ACID, ED  I-STAT CG4 LACTIC ACID, ED  I-STAT CG4 LACTIC ACID, ED    Imaging Review Dg Chest Port 1 View  05/28/2014   CLINICAL DATA:  Shortness of breath for 4 days.  EXAM: PORTABLE CHEST - 1 VIEW  COMPARISON:  August 30, 2013.  FINDINGS: The heart size and mediastinal contours are within normal limits. Both lungs are clear. No  pneumothorax or pleural effusion is noted. The visualized skeletal structures are unremarkable.  IMPRESSION: No acute cardiopulmonary abnormality seen.   Electronically Signed   By: Marijo Conception, M.D.   On: 05/28/2014 14:34     EKG Interpretation   Date/Time:  Saturday May 28 2014 14:09:04 EDT Ventricular Rate:  126 PR Interval:  82 QRS Duration: 75 QT Interval:  291 QTC Calculation: 421 R Axis:   75 Text Interpretation:  Sinus tachycardia Baseline wander in lead(s) V2  Since last tracing rate faster Confirmed by KNAPP  MD-J, JON ([id number]) on  05/28/2014 2:15:07 PM      MDM   Final diagnoses:  CAP (community acquired pneumonia)    Patient with shortness of breath. Does have some localizing lung findings on exam. History of asthma. We'll treat as pneumonia.    Davonna Belling, MD 05/30/14 [phone]

## 2014-05-28 NOTE — ED Notes (Signed)
Pt having fever/cough/chills since Wednesday. Reports coughing up green phlegm. Has only taking ThermaFlu with no alleviation. Has not had flu shot this year. Denies emesis but has had diarrhea. Decreased PO intake recently. Also reports blood in urine. Feels SOB at this time. Denies chest pain. No other c/c.

## 2014-05-28 NOTE — ED Notes (Signed)
Pt refused to give a urine sample and be weight Rn notified

## 2014-05-28 NOTE — ED Provider Notes (Signed)
MSE was initiated and I personally evaluated the patient and placed orders (if any) at  2:59 PM on May 28, 2014.  Pt presents with complaints of fevers and chills.  Pt has been coughing.  Also noticed blood in his urine.  Pt is still fully dressed with a hat and scarf covering his face and head, sitting in chair at the bedside in no distress.  Asked pt to undress.  Lungs are clear without wheezing.  Sepsis labs ordered.  CXR and UA and cultures.  IV fluids.    Dorie Rank, MD 05/28/14 1501

## 2014-05-29 LAB — URINE CULTURE
Colony Count: NO GROWTH
Culture: NO GROWTH

## 2014-06-03 LAB — CULTURE, BLOOD (ROUTINE X 2)
Culture: NO GROWTH
Culture: NO GROWTH

## 2014-12-28 ENCOUNTER — Other Ambulatory Visit: Payer: Self-pay | Admitting: Internal Medicine

## 2014-12-28 DIAGNOSIS — J449 Chronic obstructive pulmonary disease, unspecified: Secondary | ICD-10-CM

## 2014-12-28 MED ORDER — FLUTICASONE-SALMETEROL 100-50 MCG/DOSE IN AEPB: 1.0000 | INHALATION_SPRAY | Freq: Two times a day (BID) | RESPIRATORY_TRACT | Status: DC

## 2014-12-28 MED ORDER — ALBUTEROL SULFATE HFA 108 (90 BASE) MCG/ACT IN AERS: 2.0000 | INHALATION_SPRAY | RESPIRATORY_TRACT | Status: DC | PRN

## 2014-12-29 ENCOUNTER — Other Ambulatory Visit: Payer: Self-pay | Admitting: Internal Medicine

## 2014-12-29 DIAGNOSIS — J449 Chronic obstructive pulmonary disease, unspecified: Secondary | ICD-10-CM

## 2014-12-29 MED ORDER — ALBUTEROL SULFATE HFA 108 (90 BASE) MCG/ACT IN AERS: 2.0000 | INHALATION_SPRAY | RESPIRATORY_TRACT | Status: DC | PRN

## 2015-08-28 ENCOUNTER — Encounter (HOSPITAL_COMMUNITY): Payer: Self-pay | Admitting: Emergency Medicine

## 2015-08-28 ENCOUNTER — Emergency Department (HOSPITAL_COMMUNITY)
Admission: EM | Admit: 2015-08-28 | Discharge: 2015-08-28 | Disposition: A | Discharge status: Home / Self Care | Payer: Self-pay | Attending: Emergency Medicine | Admitting: Emergency Medicine

## 2015-08-28 ENCOUNTER — Emergency Department (HOSPITAL_COMMUNITY): Payer: Self-pay

## 2015-08-28 DIAGNOSIS — F17210 Nicotine dependence, cigarettes, uncomplicated: Secondary | ICD-10-CM | POA: Insufficient documentation

## 2015-08-28 DIAGNOSIS — I10 Essential (primary) hypertension: Secondary | ICD-10-CM | POA: Insufficient documentation

## 2015-08-28 DIAGNOSIS — J45901 Unspecified asthma with (acute) exacerbation: Secondary | ICD-10-CM | POA: Insufficient documentation

## 2015-08-28 DIAGNOSIS — Z79899 Other long term (current) drug therapy: Secondary | ICD-10-CM | POA: Insufficient documentation

## 2015-08-28 DIAGNOSIS — J441 Chronic obstructive pulmonary disease with (acute) exacerbation: Secondary | ICD-10-CM | POA: Insufficient documentation

## 2015-08-28 DIAGNOSIS — R0602 Shortness of breath: Secondary | ICD-10-CM

## 2015-08-28 LAB — BASIC METABOLIC PANEL
Anion gap: 4 — ABNORMAL LOW (ref 5–15)
BUN: 15 mg/dL (ref 6–20)
CALCIUM: 8.8 mg/dL — AB (ref 8.9–10.3)
CO2: 27 mmol/L (ref 22–32)
Chloride: 106 mmol/L (ref 101–111)
Creatinine, Ser: 1 mg/dL (ref 0.61–1.24)
GFR calc Af Amer: >60 mL/min (ref >60)
GFR calc non Af Amer: >60 mL/min (ref >60)
GLUCOSE: 93 mg/dL (ref 65–99)
Potassium: 3.9 mmol/L (ref 3.5–5.1)
Sodium: 137 mmol/L (ref 135–145)

## 2015-08-28 LAB — CBC
HEMATOCRIT: 43.2 % (ref 39.0–52.0)
Hemoglobin: 14.7 g/dL (ref 13.0–17.0)
MCH: 32.2 pg (ref 26.0–34.0)
MCHC: 34 g/dL (ref 30.0–36.0)
MCV: 94.5 fL (ref 78.0–100.0)
Platelets: 186 10*3/uL (ref 150–400)
RBC: 4.57 MIL/uL (ref 4.22–5.81)
RDW: 14 % (ref 11.5–15.5)
WBC: 4.4 10*3/uL (ref 4.0–10.5)

## 2015-08-28 MED ORDER — IPRATROPIUM-ALBUTEROL 0.5-2.5 (3) MG/3ML IN SOLN
3.0000 mL | Freq: Four times a day (QID) | RESPIRATORY_TRACT | Status: DC
  Administered 2015-08-28: 3 mL via RESPIRATORY_TRACT
  Filled 2015-08-28: qty 3

## 2015-08-28 MED ORDER — PREDNISONE 10 MG PO TABS: 40.0000 mg | ORAL_TABLET | Freq: Every day | ORAL | 0 refills | Status: AC

## 2015-08-28 MED ORDER — ALBUTEROL SULFATE HFA 108 (90 BASE) MCG/ACT IN AERS
2.0000 | INHALATION_SPRAY | Freq: Once | RESPIRATORY_TRACT | Status: AC
  Administered 2015-08-28: 2 via RESPIRATORY_TRACT
  Filled 2015-08-28: qty 6.7

## 2015-08-28 MED ORDER — PREDNISONE 20 MG PO TABS
40.0000 mg | ORAL_TABLET | Freq: Once | ORAL | Status: AC
  Administered 2015-08-28: 40 mg via ORAL
  Filled 2015-08-28: qty 2

## 2015-08-28 MED ORDER — ALBUTEROL SULFATE (2.5 MG/3ML) 0.083% IN NEBU
5.0000 mg | INHALATION_SOLUTION | Freq: Once | RESPIRATORY_TRACT | Status: AC
  Administered 2015-08-28: 5 mg via RESPIRATORY_TRACT
  Filled 2015-08-28: qty 6

## 2015-08-28 NOTE — ED Triage Notes (Signed)
Pt reports asthma exacerbation for past month unrelieved by inhaler. Pt reports increased smoking related to stress. Pt speaking complete sentences but SOB with walking/exertion. Pt REFUSES wheelchair to room.

## 2015-08-28 NOTE — ED Notes (Signed)
EKG handed to Dr. Pickering for review 

## 2015-08-28 NOTE — ED Provider Notes (Signed)
[facility] DEPT Provider Note   CSN: [id number] Arrival date & time: 08/28/15  1454     History   Chief Complaint Chief Complaint  Patient presents with  . Asthma    HPI [name] is a 60 y.o. male.  The history is provided by the patient.  Asthma  This is a recurrent problem. The current episode started 1 to 2 hours ago. The problem occurs every several days. The problem has not changed since onset.Associated symptoms include shortness of breath. Pertinent negatives include no chest pain, no abdominal pain and no headaches. The symptoms are aggravated by coughing. Nothing relieves the symptoms. Treatments tried: albuterol. The treatment provided no relief.    Past Medical History:  Diagnosis Date  . Asthma   . COPD (chronic obstructive pulmonary disease) ([place])   . Hypertension     Patient Active Problem List   Diagnosis Date Noted  . Essential hypertension 10/22/2013  . Elevated BP 07/19/2013  . Obesity 02/04/2012  . Acute respiratory failure with hypoxia ([place]) 02/04/2012  . COPD with exacerbation ([place]) 02/03/2012  . Community acquired pneumonia 02/03/2012  . Tobacco abuse 02/03/2012    Past Surgical History:  Procedure Laterality Date  . HAND SURGERY         Home Medications    Prior to Admission medications   Medication Sig Start Date End Date Taking? Authorizing Provider  albuterol (PROVENTIL HFA;VENTOLIN HFA) 108 (90 BASE) MCG/ACT inhaler Inhale 2 puffs into the lungs every 4 (four) hours as needed for wheezing or shortness of breath. 12/29/14  Yes Tresa Garter, MD  Fluticasone-Salmeterol (ADVAIR) 100-50 MCG/DOSE AEPB Inhale 1 puff into the lungs 2 (two) times daily. 12/28/14  Yes Tresa Garter, MD  predniSONE (DELTASONE) 10 MG tablet Take 4 tablets (40 mg total) by mouth daily. 08/28/15 09/01/15  Fatima Blank, MD    Family History Family History  Problem Relation Age of Onset  . Hypertension Paternal Uncle   .  Cancer Maternal Grandmother   . Hypertension Maternal Grandfather     Social History Social History  Substance Use Topics  . Smoking status: Current Some Day Smoker    Packs/day: 2.00    Years: 39.00    Types: Cigarettes  . Smokeless tobacco: Never Used  . Alcohol use Yes     Comment: "every once in awhile"     Allergies   Codeine and Penicillins   Review of Systems Review of Systems  Constitutional: Negative for appetite change, chills, fatigue and fever.  HENT: Negative for congestion, ear pain, facial swelling, mouth sores and sore throat.   Eyes: Negative for visual disturbance.  Respiratory: Positive for shortness of breath. Negative for cough and chest tightness.   Cardiovascular: Negative for chest pain and palpitations.  Gastrointestinal: Negative for abdominal pain, blood in stool, diarrhea, nausea and vomiting.  Endocrine: Negative for cold intolerance and heat intolerance.  Genitourinary: Negative for decreased urine volume, difficulty urinating and frequency.  Musculoskeletal: Negative for back pain and neck stiffness.  Skin: Negative for rash.  Neurological: Negative for dizziness, weakness, light-headedness and headaches.     Physical Exam Updated Vital Signs BP 162/94 (BP Location: Left Arm)   Pulse 95   Temp 98.3 F (36.8 C) (Oral)   Resp 22   Ht 5\' 9"  (1.753 m)   Wt 250 lb (113.4 kg)   SpO2 96%   BMI 36.92 kg/m   Physical Exam  Constitutional: He is oriented to person, place, and time.  He appears well-nourished. No distress.  HENT:  Head: Normocephalic and atraumatic.  Right Ear: External ear normal.  Left Ear: External ear normal.  Eyes: Pupils are equal, round, and reactive to light. Right eye exhibits no discharge. Left eye exhibits no discharge. No scleral icterus.  Neck: Normal range of motion. Neck supple.  3 mobile nodules on the nuchal region. nontender  Cardiovascular: Normal rate.  Exam reveals no gallop and no friction rub.   No  murmur heard. Pulmonary/Chest: Effort normal. No stridor. No respiratory distress. He has decreased breath sounds. He has wheezes. He has no rales. He exhibits no tenderness.  Abdominal: Soft. He exhibits no distension and no mass. There is no tenderness. There is no rebound and no guarding.  Musculoskeletal: He exhibits no edema or tenderness.  Neurological: He is alert and oriented to person, place, and time.  Skin: Skin is warm and dry. No rash noted. He is not diaphoretic. No erythema.     ED Treatments / Results  Labs (all labs ordered are listed, but only abnormal results are displayed) Labs Reviewed  BASIC METABOLIC PANEL - Abnormal; Notable for the following:       Result Value   Calcium 8.8 (*)    Anion gap 4 (*)    All other components within normal limits  CBC    EKG  EKG Interpretation  Date/Time:  Monday August 28 2015 15:24:30 EDT Ventricular Rate:  92 PR Interval:    QRS Duration: 84 QT Interval:  343 QTC Calculation: 425 R Axis:   72 Text Interpretation:  Sinus rhythm Borderline short PR interval Probable anteroseptal infarct, old Confirmed by [facility] MD, PEDRO ([id number]) on 08/28/2015 4:17:02 PM       Radiology Dg Chest 2 View  Result Date: 08/28/2015 CLINICAL DATA:  Asthma exacerbation for the past month with shortness of breath. Increased smoking. EXAM: CHEST  2 VIEW COMPARISON:  05/28/2014. FINDINGS: Normal sized heart. Clear lungs. Central peribronchial thickening without significant change. Mild scoliosis. Thoracic spine degenerative changes. IMPRESSION: No acute abnormality.  Stable chronic bronchitic changes. Electronically Signed   By: Claudie Revering M.D.   On: 08/28/2015 16:51    Procedures Procedures (including critical care time)  Medications Ordered in ED Medications  albuterol (PROVENTIL) (2.5 MG/3ML) 0.083% nebulizer solution 5 mg (5 mg Nebulization Given 08/28/15 1517)  predniSONE (DELTASONE) tablet 40 mg (40 mg Oral Given 08/28/15 1656)    albuterol (PROVENTIL HFA;VENTOLIN HFA) 108 (90 Base) MCG/ACT inhaler 2 puff (2 puffs Inhalation Given 08/28/15 1632)     Initial Impression / Assessment and Plan / ED Course  I have reviewed the triage vital signs and the nursing notes.  Pertinent labs & imaging results that were available during my care of the patient were reviewed by me and considered in my medical decision making (see chart for details).  Clinical Course   1. SOB Asthma/COPD exacerbation.  Pt has been working with mold recently and has had congestion and productive cough since. CXR Evidence of pneumonia, pulmonary edema, or effusions. No evidence of large nodules on my visualization. Radiology without mention of visible nodules as well..  Given steroid, breathing treatment, with improved symptoms.   Counseled patient for approximately 5 minutes regarding smoking cessation. Discussed risks of smoking and how they applied and affected their visit here today. Patient ready to quit at this time, and will follow up with their primary doctor.   CPT code: [id number]: intermediate counseling for smoking cessation  2. Nuchal Nodules: Possible  lipoma per pt, but he is unsure. Pt is a long time smoker. Last CXR in 2016 w/o nodules. Most likely lipoma. Will have pt follow up with PCP to keep an eye on it.    Final Clinical Impressions(s) / ED Diagnoses   Final diagnoses:  SOB (shortness of breath)  Asthma exacerbation   Disposition: Discharge  Condition: Good  I have discussed the results, Dx and Tx plan with the patient who expressed understanding and agree(s) with the plan. Discharge instructions discussed at great length. The patient was given strict return precautions who verbalized understanding of the instructions. No further questions at time of discharge.    Discharge Medication List as of 08/28/2015  5:51 PM    START taking these medications   Details  predniSONE (DELTASONE) 10 MG tablet Take 4 tablets (40 mg total)  by mouth daily., Starting Mon 08/28/2015, Until Fri 09/01/2015, Print        Follow Up: Lorayne Marek, MD  Call  As needed      Fatima Blank, MD 08/29/15 0010

## 2016-03-12 ENCOUNTER — Ambulatory Visit: Payer: Self-pay | Attending: Internal Medicine | Admitting: Internal Medicine

## 2016-03-12 ENCOUNTER — Encounter: Payer: Self-pay | Admitting: Internal Medicine

## 2016-03-12 VITALS — BP 128/93 | HR 100 | Temp 99.8°F | Resp 16 | Wt 245.0 lb

## 2016-03-12 DIAGNOSIS — Z720 Tobacco use: Secondary | ICD-10-CM

## 2016-03-12 DIAGNOSIS — Z23 Encounter for immunization: Secondary | ICD-10-CM

## 2016-03-12 DIAGNOSIS — R7303 Prediabetes: Secondary | ICD-10-CM

## 2016-03-12 DIAGNOSIS — Z7951 Long term (current) use of inhaled steroids: Secondary | ICD-10-CM | POA: Insufficient documentation

## 2016-03-12 DIAGNOSIS — F17210 Nicotine dependence, cigarettes, uncomplicated: Secondary | ICD-10-CM | POA: Insufficient documentation

## 2016-03-12 DIAGNOSIS — Z880 Allergy status to penicillin: Secondary | ICD-10-CM | POA: Insufficient documentation

## 2016-03-12 DIAGNOSIS — I10 Essential (primary) hypertension: Secondary | ICD-10-CM

## 2016-03-12 DIAGNOSIS — Z1159 Encounter for screening for other viral diseases: Secondary | ICD-10-CM

## 2016-03-12 DIAGNOSIS — E119 Type 2 diabetes mellitus without complications: Secondary | ICD-10-CM | POA: Insufficient documentation

## 2016-03-12 DIAGNOSIS — Z131 Encounter for screening for diabetes mellitus: Secondary | ICD-10-CM

## 2016-03-12 DIAGNOSIS — J449 Chronic obstructive pulmonary disease, unspecified: Secondary | ICD-10-CM

## 2016-03-12 DIAGNOSIS — Z125 Encounter for screening for malignant neoplasm of prostate: Secondary | ICD-10-CM

## 2016-03-12 DIAGNOSIS — K921 Melena: Secondary | ICD-10-CM

## 2016-03-12 LAB — CBC WITH DIFFERENTIAL/PLATELET
Basophils Absolute: 34 cells/uL (ref 0–200)
Basophils Relative: 1 %
Eosinophils Absolute: 136 cells/uL (ref 15–500)
Eosinophils Relative: 4 %
HEMATOCRIT: 45.4 % (ref 38.5–50.0)
HEMOGLOBIN: 15.1 g/dL (ref 13.2–17.1)
LYMPHS ABS: 1564 {cells}/uL (ref 850–3900)
Lymphocytes Relative: 46 %
MCH: 31.8 pg (ref 27.0–33.0)
MCHC: 33.3 g/dL (ref 32.0–36.0)
MCV: 95.6 fL (ref 80.0–100.0)
MPV: 9.1 fL (ref 7.5–12.5)
Monocytes Absolute: 374 cells/uL (ref 200–950)
Monocytes Relative: 11 %
NEUTROS PCT: 38 %
Neutro Abs: 1292 cells/uL — ABNORMAL LOW (ref 1500–7800)
Platelets: 175 10*3/uL (ref 140–400)
RBC: 4.75 MIL/uL (ref 4.20–5.80)
RDW: 14.1 % (ref 11.0–15.0)
WBC: 3.4 10*3/uL — ABNORMAL LOW (ref 3.8–10.8)

## 2016-03-12 LAB — BASIC METABOLIC PANEL WITH GFR
BUN: 11 mg/dL (ref 7–25)
CHLORIDE: 102 mmol/L (ref 98–110)
CO2: 29 mmol/L (ref 20–31)
Calcium: 9 mg/dL (ref 8.6–10.3)
Creat: 1.05 mg/dL (ref 0.70–1.33)
GFR, Est African American: 89 mL/min (ref >=60)
GFR, Est Non African American: 77 mL/min (ref >=60)
Glucose, Bld: 90 mg/dL (ref 65–99)
Potassium: 4.3 mmol/L (ref 3.5–5.3)
Sodium: 140 mmol/L (ref 135–146)

## 2016-03-12 LAB — POCT GLYCOSYLATED HEMOGLOBIN (HGB A1C): Hemoglobin A1C: 5.7

## 2016-03-12 MED ORDER — METFORMIN HCL 500 MG PO TABS: 500.0000 mg | ORAL_TABLET | Freq: Every day | ORAL | 3 refills | Status: DC

## 2016-03-12 MED ORDER — ALBUTEROL SULFATE HFA 108 (90 BASE) MCG/ACT IN AERS: 2.0000 | INHALATION_SPRAY | RESPIRATORY_TRACT | 3 refills | Status: DC | PRN

## 2016-03-12 MED ORDER — METFORMIN HCL 500 MG PO TABS: 500.0000 mg | ORAL_TABLET | Freq: Two times a day (BID) | ORAL | 3 refills | Status: DC

## 2016-03-12 MED ORDER — SENNOSIDES-DOCUSATE SODIUM 8.6-50 MG PO TABS: 1.0000 | ORAL_TABLET | Freq: Two times a day (BID) | ORAL | 3 refills | Status: DC

## 2016-03-12 MED ORDER — FLUTICASONE-SALMETEROL 100-50 MCG/DOSE IN AEPB: 1.0000 | INHALATION_SPRAY | Freq: Two times a day (BID) | RESPIRATORY_TRACT | 3 refills | Status: DC

## 2016-03-12 MED ORDER — BUPROPION HCL ER (SR) 150 MG PO TB12: 150.0000 mg | ORAL_TABLET | Freq: Two times a day (BID) | ORAL | 3 refills | Status: DC

## 2016-03-12 NOTE — Patient Instructions (Addendum)
Financial aid packet.  -   Steps to Quit Smoking Smoking tobacco can be bad for your health. It can also affect almost every organ in your body. Smoking puts you and people around you at risk for many serious long-lasting (chronic) diseases. Quitting smoking is hard, but it is one of the best things that you can do for your health. It is never too late to quit. What are the benefits of quitting smoking? When you quit smoking, you lower your risk for getting serious diseases and conditions. They can include:  Lung cancer or lung disease.  Heart disease.  Stroke.  Heart attack.  Not being able to have children (infertility).  Weak bones (osteoporosis) and broken bones (fractures). If you have coughing, wheezing, and shortness of breath, those symptoms may get better when you quit. You may also get sick less often. If you are pregnant, quitting smoking can help to lower your chances of having a baby of low birth weight. What can I do to help me quit smoking? Talk with your doctor about what can help you quit smoking. Some things you can do (strategies) include:  Quitting smoking totally, instead of slowly cutting back how much you smoke over a period of time.  Going to in-person counseling. You are more likely to quit if you go to many counseling sessions.  Using resources and support systems, such as:  Online chats with a Social worker.  Phone quitlines.  Printed Furniture conservator/restorer.  Support groups or group counseling.  Text messaging programs.  Mobile phone apps or applications.  Taking medicines. Some of these medicines may have nicotine in them. If you are pregnant or breastfeeding, do not take any medicines to quit smoking unless your doctor says it is okay. Talk with your doctor about counseling or other things that can help you. Talk with your doctor about using more than one strategy at the same time, such as taking medicines while you are also going to in-person  counseling. This can help make quitting easier. What things can I do to make it easier to quit? Quitting smoking might feel very hard at first, but there is a lot that you can do to make it easier. Take these steps:  Talk to your family and friends. Ask them to support and encourage you.  Call phone quitlines, reach out to support groups, or work with a Social worker.  Ask people who smoke to not smoke around you.  Avoid places that make you want (trigger) to smoke, such as:  Bars.  Parties.  Smoke-break areas at work.  Spend time with people who do not smoke.  Lower the stress in your life. Stress can make you want to smoke. Try these things to help your stress:  Getting regular exercise.  Deep-breathing exercises.  Yoga.  Meditating.  Doing a body scan. To do this, close your eyes, focus on one area of your body at a time from head to toe, and notice which parts of your body are tense. Try to relax the muscles in those areas.  Download or buy apps on your mobile phone or tablet that can help you stick to your quit plan. There are many free apps, such as QuitGuide from the [employer] Office manager for Disease Control and Prevention). You can find more support from smokefree.gov and other websites. This information is not intended to replace advice given to you by your health care provider. Make sure you discuss any questions you have with your health care provider.  Document Released: 10/27/2008 Document Revised: 08/29/2015 Document Reviewed: 05/17/2014 Elsevier Interactive Patient Education  2017 [employer]. \   Preventing Type 2 Diabetes Mellitus Type 2 diabetes (type 2 diabetes mellitus) is a long-term (chronic) disease that affects blood sugar (glucose) levels. Normally, a hormone called insulin allows glucose to enter cells in the body. The cells use glucose for energy. In type 2 diabetes, one or both of these problems may be present:  The body does not make enough insulin.  The  body does not respond properly to insulin that it makes (insulin resistance). Insulin resistance or lack of insulin causes excess glucose to build up in the blood instead of going into cells. As a result, high blood glucose (hyperglycemia) develops, which can cause many complications. Being overweight or obese and having an inactive (sedentary) lifestyle can increase your risk for diabetes. Type 2 diabetes can be delayed or prevented by making certain nutrition and lifestyle changes. What nutrition changes can be made?  Eat healthy meals and snacks regularly. Keep a healthy snack with you for when you get hungry between meals, such as fruit or a handful of nuts.  Eat lean meats and proteins that are low in saturated fats, such as chicken, fish, egg whites, and beans. Avoid processed meats.  Eat plenty of fruits and vegetables and plenty of grains that have not been processed (whole grains). It is recommended that you eat:  1?2 cups of fruit every day.  2?3 cups of vegetables every day.  6?8 oz of whole grains every day, such as oats, whole wheat, bulgur, brown rice, quinoa, and millet.  Eat low-fat dairy products, such as milk, yogurt, and cheese.  Eat foods that contain healthy fats, such as nuts, avocado, olive oil, and canola oil.  Drink water throughout the day. Avoid drinks that contain added sugar, such as soda or sweet tea.  Follow instructions from your health care provider about specific eating or drinking restrictions.  Control how much food you eat at a time (portion size).  Check food labels to find out the serving sizes of foods.  Use a kitchen scale to weigh amounts of foods.  Saute or steam food instead of frying it. Cook with water or broth instead of oils or butter.  Limit your intake of:  Salt (sodium). Have no more than 1 tsp (2,400 mg) of sodium a day. If you have heart disease or high blood pressure, have less than ? tsp (1,500 mg) of sodium a  day.  Saturated fat. This is fat that is solid at room temperature, such as butter or fat on meat. What lifestyle changes can be made?   Activity   Do moderate-intensity physical activity for at least 30 minutes on at least 5 days of the week, or as much as told by your health care provider.  Ask your health care provider what activities are safe for you. A mix of physical activities may be best, such as walking, swimming, cycling, and strength training.  Try to add physical activity into your day. For example:  Park in spots that are farther away than usual, so that you walk more. For example, park in a far corner of the parking lot when you go to the office or the grocery store.  Take a walk during your lunch break.  Use stairs instead of elevators or escalators. Weight Loss   Lose weight as directed. Your health care provider can determine how much weight loss is best for you and  can help you lose weight safely.  If you are overweight or obese, you may be instructed to lose at least 5?7 % of your body weight. Alcohol and Tobacco      Do not use any tobacco products, such as cigarettes, chewing tobacco, and e-cigarettes. If you need help quitting, ask your health care provider. Work With [facility] Provider   Have your blood glucose tested regularly, as told by your health care provider.  Discuss your risk factors and how you can reduce your risk for diabetes.  Get screening tests as told by your health care provider. You may have screening tests regularly, especially if you have certain risk factors for type 2 diabetes.  Make an appointment with a diet and nutrition specialist (registered dietitian). A registered dietitian can help you make a healthy eating plan and can help you understand portion sizes and food labels. Why are these changes important?  It is possible to prevent or delay type 2 diabetes and related health problems by making lifestyle and nutrition  changes.  It can be difficult to recognize signs of type 2 diabetes. The best way to avoid possible damage to your body is to take actions to prevent the disease before you develop symptoms. What can happen if changes are not made?  Your blood glucose levels may keep increasing. Having high blood glucose for a long time is dangerous. Too much glucose in your blood can damage your blood vessels, heart, kidneys, nerves, and eyes.  You may develop prediabetes or type 2 diabetes. Type 2 diabetes can lead to many chronic health problems and complications, such as:  Heart disease.  Stroke.  Blindness.  Kidney disease.  Depression.  Poor circulation in the feet and legs, which could lead to surgical removal (amputation) in severe cases. Where to find support:  Ask your health care provider to recommend a registered dietitian, diabetes educator, or weight loss program.  Look for local or online weight loss groups.  Join a gym, fitness club, or outdoor activity group, such as a walking club. Where to find more information: To learn more about diabetes and diabetes prevention, visit:  American Diabetes Association (ADA): www.diabetes.[employer] of Diabetes and Digestive and Kidney Diseases: FindSpin.nl To learn more about healthy eating, visit:  The U.S. Department of Agriculture Scientist, research (physical sciences)), Choose My Plate: http://wiley-williams.com/  Office of Disease Prevention and Health Promotion (ODPHP), Dietary Guidelines: SurferLive.at Summary  You can reduce your risk for type 2 diabetes by increasing your physical activity, eating healthy foods, and losing weight as directed.  Talk with your health care provider about your risk for type 2 diabetes. Ask about any blood tests or screening tests that you need to have. This information is not intended to replace advice given to you by your health care provider. Make sure you  discuss any questions you have with your health care provider. Document Released: 04/24/2015 Document Revised: 06/08/2015 Document Reviewed: 02/21/2015 Elsevier Interactive Patient Education  2017 [employer].

## 2016-03-12 NOTE — Progress Notes (Signed)
[name], is a 61 y.o. male  [id number]  [id number]  DOB - 20-Sep-1955  CC:  Chief Complaint  Patient presents with  . Establish Care  . Hypertension       HPI: [name] is a 61 y.o. male here today to establish medical care. Last seen 4/16. Has copd, ran out of all his inhalers recently. Still smoking 1ppd. Wants to quit, but has failed nicorette gum and patch in past.  Has been smoking since he was 61yo.  Co of brbpr in past (around Christmas time), w/  Intermittent hard dark (brown) stools, but denies melena. No prior colonoscopy. Does not know if he has hemorrhoids, but denies burning and itching.  Denies current bleeding.  Patient has No headache, No chest pain, No abdominal pain - No Nausea, No new weakness tingling or numbness, No Cough - SOB. No weight gain /weight loss recently. +doe with 1- 2 blocks of walking. No recent f/c.    Review of Systems: Per hpi, o/w all systems reviewed and negative.    Allergies  Allergen Reactions  . Codeine Itching  . Penicillins Itching and Other (See Comments)    Has patient had a PCN reaction causing immediate rash, facial/tongue/throat swelling, SOB or lightheadedness with hypotension: No Has patient had a PCN reaction causing severe rash involving mucus membranes or skin necrosis: No Has patient had a PCN reaction that required hospitalization No Has patient had a PCN reaction occurring within the last 10 years: No If all of the above answers are "NO", then may proceed with Cephalosporin use.   Past Medical History:  Diagnosis Date  . Asthma   . COPD (chronic obstructive pulmonary disease) ([place])   . Hypertension    No current outpatient prescriptions on file prior to visit.   No current facility-administered medications on file prior to visit.    Family History  Problem Relation Age of Onset  . Hypertension Paternal Uncle   . Cancer Maternal Grandmother   . Hypertension Maternal Grandfather    Social  History   Social History  . Marital status: Married    Spouse name: N/A  . Number of children: N/A  . Years of education: N/A   Occupational History  . Not on file.   Social History Main Topics  . Smoking status: Current Some Day Smoker    Packs/day: 2.00    Years: 39.00    Types: Cigarettes  . Smokeless tobacco: Never Used  . Alcohol use Yes     Comment: "every once in awhile"  . Drug use: No     Comment: occasinally   . Sexual activity: Not on file   Other Topics Concern  . Not on file   Social History Narrative  . No narrative on file    Objective:   Vitals:   03/12/16 1420  BP: (!) 128/93  Pulse: 100  Resp: 16  Temp: 99.8 F (37.7 C)    Filed Weights   03/12/16 1420  Weight: 245 lb (111.1 kg)    BP Readings from Last 3 Encounters:  03/12/16 (!) 128/93  08/28/15 134/91  05/28/14 144/86    Physical Exam: Constitutional: Patient appears well-developed and well-nourished. No distress. AAOx3, pleasant. HENT: Normocephalic, atraumatic, External right and left ear normal. Oropharynx is clear and moist.  Eyes: Conjunctivae and EOM are normal. PERRL, no scleral icterus. Neck: Normal ROM. Neck supple. No JVD.  CVS: RRR, S1/S2 +, no murmurs, no gallops, no carotid bruit.  Pulmonary: Effort  and breath sounds diminished diffusely, no w/c/r. Abdominal: Soft. BS +, obese, no distension, tenderness, rebound or guarding.  Musculoskeletal: Normal range of motion. No edema and no tenderness.  LE: bilat/ no c/c/e, pulses 2+ bilateral. Neuro: Alert.  muscle tone coordination wnl. No cranial nerve deficit grossly. Skin: Skin is warm and dry. No rash noted. Not diaphoretic. No erythema. No pallor. Psychiatric: Normal mood and affect. Behavior, judgment, thought content normal.  Lab Results  Component Value Date   WBC 4.4 08/28/2015   HGB 14.7 08/28/2015   HCT 43.2 08/28/2015   MCV 94.5 08/28/2015   PLT 186 08/28/2015   Lab Results  Component Value Date    CREATININE 1.00 08/28/2015   BUN 15 08/28/2015   NA 137 08/28/2015   K 3.9 08/28/2015   CL 106 08/28/2015   CO2 27 08/28/2015    Lab Results  Component Value Date   HGBA1C 5.7 03/12/2016   Lipid Panel     Component Value Date/Time   CHOL 184 07/15/2013 1152   TRIG 95 07/15/2013 1152   HDL 38 (L) 07/15/2013 1152   CHOLHDL 4.8 07/15/2013 1152   VLDL 19 07/15/2013 1152   LDLCALC 127 (H) 07/15/2013 1152        Depression screen PHQ 2/9 03/12/2016 07/15/2013  Decreased Interest 0 0  Down, Depressed, Hopeless 0 0  PHQ - 2 Score 0 0    Assessment and plan:   1. COPD mixed type ([place]) - total smoking cessation recd.,  - albuterol (PROVENTIL HFA;VENTOLIN HFA) 108 (90 Base) MCG/ACT inhaler; Inhale 2 puffs into the lungs every 4 (four) hours as needed for wheezing or shortness of breath.  Dispense: 54 g; Refill: 3 - Fluticasone-Salmeterol (ADVAIR) 100-50 MCG/DOSE AEPB; Inhale 1 puff into the lungs 2 (two) times daily.  Dispense: 180 each; Refill: 3  2. Diabetes mellitus screening Low carb diet discussed, recd increase activity as tol. - will start metformin 500 qd, Metformin helps your body process sugars better, also helps w/ some weight loss as well.   But can cause some indigestion/n/diarrhea, but sx usually resolved in 2 wks or so. - POCT glycosylated hemoglobin (Hb A1C)  5.7 - Microalbumin/Creatinine Ratio, Urine - BASIC METABOLIC PANEL WITH GFR  3. Essential hypertension Slight elevated today, low salt diet discussed  4. Tobacco abuse Total cessation recd.  Trial welbutrin 150bid.   5. Hematochezia, transient, ?hemorrhoids - uninsured, needs financial aid for colonoscopy - CBC with Differential - stool softeners added  6. Financial aid packet.  7. Prostate cancer screening - PSA  8. Need for hepatitis C screening test - Hepatitis C antibody   Return in about 4 weeks (around 04/09/2016), or if symptoms worsen or fail to improve, for copd..  The patient was  given clear instructions to go to ER or return to medical center if symptoms don't improve, worsen or new problems develop. The patient verbalized understanding. The patient was told to call to get lab results if they haven't heard anything in the next week.    This note has been created with Surveyor, quantity. Any transcriptional errors are unintentional.   Maren Reamer, MD, [facility], [place]   03/12/2016, 4:02 PM

## 2016-03-13 LAB — MICROALBUMIN / CREATININE URINE RATIO
Creatinine, Urine: 220 mg/dL (ref 20–370)
Microalb Creat Ratio: 3 mcg/mg creat (ref <30)
Microalb, Ur: 0.6 mg/dL

## 2016-03-13 LAB — PSA: PSA: 0.4 ng/mL (ref <=4.0)

## 2016-03-13 LAB — HEPATITIS C ANTIBODY: HCV AB: NEGATIVE

## 2016-03-20 ENCOUNTER — Telehealth: Payer: Self-pay

## 2016-03-20 NOTE — Telephone Encounter (Signed)
Contacted pt to go over lab results pt is aware of results and doesn't have any questions or concerns 

## 2016-03-26 ENCOUNTER — Emergency Department (HOSPITAL_COMMUNITY)
Admission: EM | Admit: 2016-03-26 | Discharge: 2016-03-26 | Disposition: A | Discharge status: Home / Self Care | Payer: Self-pay | Attending: Emergency Medicine | Admitting: Emergency Medicine

## 2016-03-26 ENCOUNTER — Emergency Department (HOSPITAL_COMMUNITY): Payer: Self-pay

## 2016-03-26 ENCOUNTER — Encounter (HOSPITAL_COMMUNITY): Payer: Self-pay

## 2016-03-26 DIAGNOSIS — J4541 Moderate persistent asthma with (acute) exacerbation: Secondary | ICD-10-CM | POA: Insufficient documentation

## 2016-03-26 DIAGNOSIS — F17210 Nicotine dependence, cigarettes, uncomplicated: Secondary | ICD-10-CM | POA: Insufficient documentation

## 2016-03-26 DIAGNOSIS — I10 Essential (primary) hypertension: Secondary | ICD-10-CM | POA: Insufficient documentation

## 2016-03-26 DIAGNOSIS — Z79899 Other long term (current) drug therapy: Secondary | ICD-10-CM | POA: Insufficient documentation

## 2016-03-26 DIAGNOSIS — J111 Influenza due to unidentified influenza virus with other respiratory manifestations: Secondary | ICD-10-CM | POA: Insufficient documentation

## 2016-03-26 DIAGNOSIS — R69 Illness, unspecified: Secondary | ICD-10-CM

## 2016-03-26 DIAGNOSIS — J449 Chronic obstructive pulmonary disease, unspecified: Secondary | ICD-10-CM | POA: Insufficient documentation

## 2016-03-26 MED ORDER — ALBUTEROL SULFATE HFA 108 (90 BASE) MCG/ACT IN AERS
2.0000 | INHALATION_SPRAY | Freq: Once | RESPIRATORY_TRACT | Status: AC
  Administered 2016-03-26: 2 via RESPIRATORY_TRACT
  Filled 2016-03-26: qty 6.7

## 2016-03-26 MED ORDER — ALBUTEROL SULFATE (2.5 MG/3ML) 0.083% IN NEBU
INHALATION_SOLUTION | RESPIRATORY_TRACT | Status: AC
  Filled 2016-03-26: qty 6

## 2016-03-26 MED ORDER — IPRATROPIUM-ALBUTEROL 0.5-2.5 (3) MG/3ML IN SOLN
3.0000 mL | Freq: Once | RESPIRATORY_TRACT | Status: AC
  Administered 2016-03-26: 3 mL via RESPIRATORY_TRACT
  Filled 2016-03-26: qty 3

## 2016-03-26 MED ORDER — ACETAMINOPHEN 325 MG PO TABS
650.0000 mg | ORAL_TABLET | Freq: Once | ORAL | Status: AC
  Administered 2016-03-26: 650 mg via ORAL
  Filled 2016-03-26: qty 2

## 2016-03-26 MED ORDER — ALBUTEROL SULFATE (2.5 MG/3ML) 0.083% IN NEBU
5.0000 mg | INHALATION_SOLUTION | Freq: Once | RESPIRATORY_TRACT | Status: AC
  Administered 2016-03-26: 5 mg via RESPIRATORY_TRACT

## 2016-03-26 NOTE — ED Provider Notes (Signed)
[facility] DEPT Provider Note   CSN: [id number] Arrival date & time: 03/26/16  1436     History   Chief Complaint Chief Complaint  Patient presents with  . Cough    HPI [name] is a 61 y.o. male.  The history is provided by the patient.  Influenza  Presenting symptoms: cough, fatigue, fever, myalgias, nausea, rhinorrhea and shortness of breath   Severity:  Moderate Onset quality:  Gradual Duration:  3 days Progression:  Worsening Chronicity:  New Relieved by:  None tried Associated symptoms: chills and nasal congestion   Risk factors: sick contacts (Mother at home with similar symptoms)    On 2/27 got the pneumococcal Vax.   Past Medical History:  Diagnosis Date  . Asthma   . COPD (chronic obstructive pulmonary disease) ([place])   . Hypertension     Patient Active Problem List   Diagnosis Date Noted  . Diabetes mellitus screening 03/12/2016  . Essential hypertension 10/22/2013  . Elevated BP 07/19/2013  . Obesity 02/04/2012  . Acute respiratory failure with hypoxia ([place]) 02/04/2012  . COPD with exacerbation ([place]) 02/03/2012  . Community acquired pneumonia 02/03/2012  . Tobacco abuse 02/03/2012    Past Surgical History:  Procedure Laterality Date  . HAND SURGERY         Home Medications    Prior to Admission medications   Medication Sig Start Date End Date Taking? Authorizing Provider  albuterol (PROVENTIL HFA;VENTOLIN HFA) 108 (90 Base) MCG/ACT inhaler Inhale 2 puffs into the lungs every 4 (four) hours as needed for wheezing or shortness of breath. 03/12/16  Yes Maren Reamer, MD  Fluticasone-Salmeterol (ADVAIR) 100-50 MCG/DOSE AEPB Inhale 1 puff into the lungs 2 (two) times daily. 03/12/16  Yes Maren Reamer, MD  buPROPion (WELLBUTRIN SR) 150 MG 12 hr tablet Take 1 tablet (150 mg total) by mouth 2 (two) times daily. Patient not taking: Reported on 03/26/2016 03/12/16   Maren Reamer, MD  metFORMIN (GLUCOPHAGE) 500 MG tablet Take 1  tablet (500 mg total) by mouth daily with breakfast. Patient not taking: Reported on 03/26/2016 03/12/16   Maren Reamer, MD  senna-docusate (SENOKOT-S) 8.6-50 MG tablet Take 1 tablet by mouth 2 (two) times daily. Patient not taking: Reported on 03/26/2016 03/12/16   Maren Reamer, MD    Family History Family History  Problem Relation Age of Onset  . Hypertension Paternal Uncle   . Cancer Maternal Grandmother   . Hypertension Maternal Grandfather     Social History Social History  Substance Use Topics  . Smoking status: Current Some Day Smoker    Packs/day: 2.00    Years: 39.00    Types: Cigarettes  . Smokeless tobacco: Never Used  . Alcohol use Yes     Comment: "every once in awhile"     Allergies   Codeine and Penicillins   Review of Systems Review of Systems  Constitutional: Positive for chills, fatigue and fever.  HENT: Positive for congestion and rhinorrhea.   Respiratory: Positive for cough and shortness of breath.   Gastrointestinal: Positive for nausea.  Musculoskeletal: Positive for myalgias.   Ten systems are reviewed and are negative for acute change except as noted in the HPI   Physical Exam Updated Vital Signs BP 134/82 (BP Location: Right Arm)   Pulse 109   Temp 101.2 F (38.4 C) (Oral)   Resp 18   Ht 5\' 9"  (1.753 m)   Wt 245 lb (111.1 kg)   SpO2 94%  BMI 36.18 kg/m   Physical Exam  Constitutional: He is oriented to person, place, and time. He appears well-developed and well-nourished. No distress.  HENT:  Head: Normocephalic and atraumatic.  Nose: Nose normal.  Eyes: Conjunctivae and EOM are normal. Pupils are equal, round, and reactive to light. Right eye exhibits no discharge. Left eye exhibits no discharge. No scleral icterus.  Neck: Normal range of motion. Neck supple.  Cardiovascular: Normal rate and regular rhythm.  Exam reveals no gallop and no friction rub.   No murmur heard. Pulmonary/Chest: Effort normal. No stridor. No  respiratory distress. He has wheezes. He has no rales.  Abdominal: Soft. He exhibits no distension. There is no tenderness.  Musculoskeletal: He exhibits no edema or tenderness.  Neurological: He is alert and oriented to person, place, and time.  Skin: Skin is warm and dry. No rash noted. He is not diaphoretic. No erythema.  Psychiatric: He has a normal mood and affect.  Vitals reviewed.    ED Treatments / Results  Labs (all labs ordered are listed, but only abnormal results are displayed) Labs Reviewed - No data to display  EKG  EKG Interpretation None       Radiology Dg Chest 2 View  Result Date: 03/26/2016 CLINICAL DATA:  Cough and congestion for 4 days EXAM: CHEST  2 VIEW COMPARISON:  August 28, 2015 FINDINGS: There is fairly diffuse interstitial thickening bilaterally. There is no frank edema or consolidation. Heart size and pulmonary vascularity are normal. No adenopathy. There is degenerative change in the thoracic spine. IMPRESSION: Somewhat diffuse interstitial thickening, probably representing a degree of underlying chronic inflammatory type change/fibrosis. No frank edema or consolidation. Cardiac silhouette within normal limits. Electronically Signed   By: Lowella Grip III M.D.   On: 03/26/2016 15:24    Procedures Procedures (including critical care time)  Medications Ordered in ED Medications  albuterol (PROVENTIL HFA;VENTOLIN HFA) 108 (90 Base) MCG/ACT inhaler 2 puff (not administered)  albuterol (PROVENTIL) (2.5 MG/3ML) 0.083% nebulizer solution 5 mg (5 mg Nebulization Given 03/26/16 1500)  acetaminophen (TYLENOL) tablet 650 mg (650 mg Oral Given 03/26/16 1849)  ipratropium-albuterol (DUONEB) 0.5-2.5 (3) MG/3ML nebulizer solution 3 mL (3 mLs Nebulization Given 03/26/16 1849)     Initial Impression / Assessment and Plan / ED Course  I have reviewed the triage vital signs and the nursing notes.  Pertinent labs & imaging results that were available during my  care of the patient were reviewed by me and considered in my medical decision making (see chart for details).     61 y.o. male presents with flu-like symptoms for 3 days. appropriate oral hydration. Rest of history as above.  Patient appears well. No signs of toxicity, patient is interactive. No hypoxia, tachypnea or other signs of respiratory distress. No sign of clinical dehydration. Lung exam consistent with asthma exacerbation w.o focal findings consistent with PNA. Rest of exam as above.  Chest x-ray without evidence suggestive of pneumonia, pneumothorax, pneumomediastinum.  No abnormal contour of the mediastinum to suggest dissection. No evidence of acute injuries.  Most consistent with flu-like illness   No evidence suggestive of pharyngitis, AOM, PNA, or meningitis.  Pt declined Tamiflu.  Discussed symptomatic treatment with the patient and they will follow closely with their PCP.     Final Clinical Impressions(s) / ED Diagnoses   Final diagnoses:  Influenza-like illness  Moderate persistent asthma with exacerbation   Disposition: Discharge  Condition: Good  I have discussed the results, Dx and Tx plan  with the patient who expressed understanding and agree(s) with the plan. Discharge instructions discussed at great length. The patient was given strict return precautions who verbalized understanding of the instructions. No further questions at time of discharge.    New Prescriptions   No medications on file    Follow Up: Lorayne Marek, MD  Schedule an appointment as soon as possible for a visit  in 3-5 days, If symptoms do not improve or  worsen      Fatima Blank, MD 03/26/16 2101

## 2016-03-26 NOTE — ED Triage Notes (Signed)
Per Pt, Pt is coming from home with complaints of cough, congestion, and fatigue x4days. Pt reports getting the pneumonia shot four days and since then starting to feel bad.

## 2016-03-29 ENCOUNTER — Ambulatory Visit: Payer: No Typology Code available for payment source | Attending: Internal Medicine

## 2016-03-29 MED FILL — !VENTOLIN HFA INHALER: 108 (90 BAS | 30 days supply | Qty: 18 | Fill #0

## 2016-03-29 MED FILL — !ADVAIR 100/50 DISKUS: 100-50 | 30 days supply | Qty: 60 | Fill #0

## 2016-04-03 ENCOUNTER — Other Ambulatory Visit: Payer: Self-pay

## 2016-04-03 DIAGNOSIS — J449 Chronic obstructive pulmonary disease, unspecified: Secondary | ICD-10-CM

## 2016-04-03 MED ORDER — FLUTICASONE-SALMETEROL 100-50 MCG/DOSE IN AEPB: 1.0000 | INHALATION_SPRAY | Freq: Two times a day (BID) | RESPIRATORY_TRACT | 3 refills | Status: DC

## 2016-04-03 MED ORDER — ALBUTEROL SULFATE HFA 108 (90 BASE) MCG/ACT IN AERS: 2.0000 | INHALATION_SPRAY | RESPIRATORY_TRACT | 3 refills | Status: DC | PRN

## 2016-04-09 ENCOUNTER — Ambulatory Visit: Payer: Self-pay | Admitting: Internal Medicine

## 2016-04-22 MED FILL — !VENTOLIN HFA INHALER: 108 (90 BAS | 30 days supply | Qty: 18 | Fill #1

## 2016-04-24 ENCOUNTER — Encounter: Payer: Self-pay | Admitting: Internal Medicine

## 2016-04-24 ENCOUNTER — Ambulatory Visit: Payer: Self-pay | Attending: Internal Medicine | Admitting: Internal Medicine

## 2016-04-24 VITALS — BP 134/92 | HR 95 | Temp 98.1°F | Resp 16 | Wt 246.8 lb

## 2016-04-24 DIAGNOSIS — R7303 Prediabetes: Secondary | ICD-10-CM | POA: Insufficient documentation

## 2016-04-24 DIAGNOSIS — Z7984 Long term (current) use of oral hypoglycemic drugs: Secondary | ICD-10-CM | POA: Insufficient documentation

## 2016-04-24 DIAGNOSIS — Z880 Allergy status to penicillin: Secondary | ICD-10-CM | POA: Insufficient documentation

## 2016-04-24 DIAGNOSIS — Z7982 Long term (current) use of aspirin: Secondary | ICD-10-CM | POA: Insufficient documentation

## 2016-04-24 DIAGNOSIS — F17290 Nicotine dependence, other tobacco product, uncomplicated: Secondary | ICD-10-CM | POA: Insufficient documentation

## 2016-04-24 DIAGNOSIS — Z885 Allergy status to narcotic agent status: Secondary | ICD-10-CM | POA: Insufficient documentation

## 2016-04-24 DIAGNOSIS — Z79899 Other long term (current) drug therapy: Secondary | ICD-10-CM | POA: Insufficient documentation

## 2016-04-24 DIAGNOSIS — J449 Chronic obstructive pulmonary disease, unspecified: Secondary | ICD-10-CM | POA: Insufficient documentation

## 2016-04-24 DIAGNOSIS — I10 Essential (primary) hypertension: Secondary | ICD-10-CM | POA: Insufficient documentation

## 2016-04-24 LAB — GLUCOSE, POCT (MANUAL RESULT ENTRY): POC GLUCOSE: 107 mg/dL — AB (ref 70–99)

## 2016-04-24 MED ORDER — ASPIRIN EC 81 MG PO TBEC: 81.0000 mg | DELAYED_RELEASE_TABLET | Freq: Every day | ORAL | 3 refills | Status: DC

## 2016-04-24 MED ORDER — ALBUTEROL SULFATE HFA 108 (90 BASE) MCG/ACT IN AERS: 2.0000 | INHALATION_SPRAY | RESPIRATORY_TRACT | 3 refills | Status: DC | PRN

## 2016-04-24 MED ORDER — HYDROCHLOROTHIAZIDE 12.5 MG PO CAPS: 12.5000 mg | ORAL_CAPSULE | Freq: Every day | ORAL | 3 refills | Status: DC

## 2016-04-24 MED ORDER — BUDESONIDE-FORMOTEROL FUMARATE 80-4.5 MCG/ACT IN AERO: 2.0000 | INHALATION_SPRAY | Freq: Two times a day (BID) | RESPIRATORY_TRACT | 3 refills | Status: DC

## 2016-04-24 MED FILL — ?HYDROCHLOROTHIAZIDE 12.5MG: 12.5 | 30 days supply | Qty: 30 | Fill #0

## 2016-04-24 MED FILL — !SYMBICORT 80-4.5 MCG INH: 80-4.5 | 30 days supply | Qty: 1 | Fill #0

## 2016-04-24 NOTE — Patient Instructions (Addendum)
For your tobacco abuse: Strongly recommend that he stop smoking back and all other inhaled products right away Call 1-800-Quit-Now to get free nicotine replacement from the state of [place]  -   Steps to Quit Smoking Smoking tobacco can be bad for your health. It can also affect almost every organ in your body. Smoking puts you and people around you at risk for many serious long-lasting (chronic) diseases. Quitting smoking is hard, but it is one of the best things that you can do for your health. It is never too late to quit. What are the benefits of quitting smoking? When you quit smoking, you lower your risk for getting serious diseases and conditions. They can include:  Lung cancer or lung disease.  Heart disease.  Stroke.  Heart attack.  Not being able to have children (infertility).  Weak bones (osteoporosis) and broken bones (fractures). If you have coughing, wheezing, and shortness of breath, those symptoms may get better when you quit. You may also get sick less often. If you are pregnant, quitting smoking can help to lower your chances of having a baby of low birth weight. What can I do to help me quit smoking? Talk with your doctor about what can help you quit smoking. Some things you can do (strategies) include:  Quitting smoking totally, instead of slowly cutting back how much you smoke over a period of time.  Going to in-person counseling. You are more likely to quit if you go to many counseling sessions.  Using resources and support systems, such as:  Online chats with a Social worker.  Phone quitlines.  Printed Furniture conservator/restorer.  Support groups or group counseling.  Text messaging programs.  Mobile phone apps or applications.  Taking medicines. Some of these medicines may have nicotine in them. If you are pregnant or breastfeeding, do not take any medicines to quit smoking unless your doctor says it is okay. Talk with your doctor about counseling or other things  that can help you. Talk with your doctor about using more than one strategy at the same time, such as taking medicines while you are also going to in-person counseling. This can help make quitting easier. What things can I do to make it easier to quit? Quitting smoking might feel very hard at first, but there is a lot that you can do to make it easier. Take these steps:  Talk to your family and friends. Ask them to support and encourage you.  Call phone quitlines, reach out to support groups, or work with a Social worker.  Ask people who smoke to not smoke around you.  Avoid places that make you want (trigger) to smoke, such as:  Bars.  Parties.  Smoke-break areas at work.  Spend time with people who do not smoke.  Lower the stress in your life. Stress can make you want to smoke. Try these things to help your stress:  Getting regular exercise.  Deep-breathing exercises.  Yoga.  Meditating.  Doing a body scan. To do this, close your eyes, focus on one area of your body at a time from head to toe, and notice which parts of your body are tense. Try to relax the muscles in those areas.  Download or buy apps on your mobile phone or tablet that can help you stick to your quit plan. There are many free apps, such as QuitGuide from the [employer] Office manager for Disease Control and Prevention). You can find more support from smokefree.gov and other websites. This information  is not intended to replace advice given to you by your health care provider. Make sure you discuss any questions you have with your health care provider. Document Released: 10/27/2008 Document Revised: 08/29/2015 Document Reviewed: 05/17/2014 Elsevier Interactive Patient Education  2017 [place].    Low-Sodium Eating Plan Sodium, which is an element that makes up salt, helps you maintain a healthy balance of fluids in your body. Too much sodium can increase your blood pressure and cause fluid and waste to be held in your  body. Your health care provider or dietitian may recommend following this plan if you have high blood pressure (hypertension), kidney disease, liver disease, or heart failure. Eating less sodium can help lower your blood pressure, reduce swelling, and protect your heart, liver, and kidneys. What are tips for following this plan? General guidelines   Most people on this plan should limit their sodium intake to 1,500-2,000 mg (milligrams) of sodium each day. Reading food labels   The Nutrition Facts label lists the amount of sodium in one serving of the food. If you eat more than one serving, you must multiply the listed amount of sodium by the number of servings.  Choose foods with less than 140 mg of sodium per serving.  Avoid foods with 300 mg of sodium or more per serving. Shopping   Look for lower-sodium products, often labeled as "low-sodium" or "no salt added."  Always check the sodium content even if foods are labeled as "unsalted" or "no salt added".  Buy fresh foods.  Avoid canned foods and premade or frozen meals.  Avoid canned, cured, or processed meats  Buy breads that have less than 80 mg of sodium per slice. Cooking   Eat more home-cooked food and less restaurant, buffet, and fast food.  Avoid adding salt when cooking. Use salt-free seasonings or herbs instead of table salt or sea salt. Check with your health care provider or pharmacist before using salt substitutes.  Cook with plant-based oils, such as canola, sunflower, or olive oil. Meal planning   When eating at a restaurant, ask that your food be prepared with less salt or no salt, if possible.  Avoid foods that contain MSG (monosodium glutamate). MSG is sometimes added to [place] food, bouillon, and some canned foods. What foods are recommended? The items listed may not be a complete list. Talk with your dietitian about what dietary choices are best for you. Grains  Low-sodium cereals, including oats,  puffed wheat and rice, and shredded wheat. Low-sodium crackers. Unsalted rice. Unsalted pasta. Low-sodium bread. Whole-grain breads and whole-grain pasta. Vegetables  Fresh or frozen vegetables. "No salt added" canned vegetables. "No salt added" tomato sauce and paste. Low-sodium or reduced-sodium tomato and vegetable juice. Fruits  Fresh, frozen, or canned fruit. Fruit juice. Meats and other protein foods  Fresh or frozen (no salt added) meat, poultry, seafood, and fish. Low-sodium canned tuna and salmon. Unsalted nuts. Dried peas, beans, and lentils without added salt. Unsalted canned beans. Eggs. Unsalted nut butters. Dairy  Milk. Soy milk. Cheese that is naturally low in sodium, such as ricotta cheese, fresh mozzarella, or Swiss cheese Low-sodium or reduced-sodium cheese. Cream cheese. Yogurt. Fats and oils  Unsalted butter. Unsalted margarine with no trans fat. Vegetable oils such as canola or olive oils. Seasonings and other foods  Fresh and dried herbs and spices. Salt-free seasonings. Low-sodium mustard and ketchup. Sodium-free salad dressing. Sodium-free light mayonnaise. Fresh or refrigerated horseradish. Lemon juice. Vinegar. Homemade, reduced-sodium, or low-sodium soups. Unsalted popcorn  and pretzels. Low-salt or salt-free chips. What foods are not recommended? The items listed may not be a complete list. Talk with your dietitian about what dietary choices are best for you. Grains  Instant hot cereals. Bread stuffing, pancake, and biscuit mixes. Croutons. Seasoned rice or pasta mixes. Noodle soup cups. Boxed or frozen macaroni and cheese. Regular salted crackers. Self-rising flour. Vegetables  Sauerkraut, pickled vegetables, and relishes. Olives. [place] fries. Onion rings. Regular canned vegetables (not low-sodium or reduced-sodium). Regular canned tomato sauce and paste (not low-sodium or reduced-sodium). Regular tomato and vegetable juice (not low-sodium or reduced-sodium). Frozen  vegetables in sauces. Meats and other protein foods  Meat or fish that is salted, canned, smoked, spiced, or pickled. Bacon, ham, sausage, hotdogs, corned beef, chipped beef, packaged lunch meats, salt pork, jerky, pickled herring, anchovies, regular canned tuna, sardines, salted nuts. Dairy  Processed cheese and cheese spreads. Cheese curds. Blue cheese. Feta cheese. String cheese. Regular cottage cheese. Buttermilk. Canned milk. Fats and oils  Salted butter. Regular margarine. Ghee. Bacon fat. Seasonings and other foods  Onion salt, garlic salt, seasoned salt, table salt, and sea salt. Canned and packaged gravies. Worcestershire sauce. Tartar sauce. Barbecue sauce. Teriyaki sauce. Soy sauce, including reduced-sodium. Steak sauce. Fish sauce. Oyster sauce. Cocktail sauce. Horseradish that you find on the shelf. Regular ketchup and mustard. Meat flavorings and tenderizers. Bouillon cubes. Hot sauce and Tabasco sauce. Premade or packaged marinades. Premade or packaged taco seasonings. Relishes. Regular salad dressings. Salsa. Potato and tortilla chips. Corn chips and puffs. Salted popcorn and pretzels. Canned or dried soups. Pizza. Frozen entrees and pot pies. Summary  Eating less sodium can help lower your blood pressure, reduce swelling, and protect your heart, liver, and kidneys.  Most people on this plan should limit their sodium intake to 1,500-2,000 mg (milligrams) of sodium each day.  Canned, boxed, and frozen foods are high in sodium. Restaurant foods, fast foods, and pizza are also very high in sodium. You also get sodium by adding salt to food.  Try to cook at home, eat more fresh fruits and vegetables, and eat less fast food, canned, processed, or prepared foods. This information is not intended to replace advice given to you by your health care provider. Make sure you discuss any questions you have with your health care provider. Document Released: 06/22/2001 Document Revised:  12/25/2015 Document Reviewed: 12/25/2015 Elsevier Interactive Patient Education  2017 Elsevier Inc.  -   Hypertension Hypertension is another name for high blood pressure. High blood pressure forces your heart to work harder to pump blood. This can cause problems over time. There are two numbers in a blood pressure reading. There is a top number (systolic) over a bottom number (diastolic). It is best to have a blood pressure below 120/80. Healthy choices can help lower your blood pressure. You may need medicine to help lower your blood pressure if:  Your blood pressure cannot be lowered with healthy choices.  Your blood pressure is higher than 130/80. Follow these instructions at home: Eating and drinking   If directed, follow the DASH eating plan. This diet includes:  Filling half of your plate at each meal with fruits and vegetables.  Filling one quarter of your plate at each meal with whole grains. Whole grains include whole wheat pasta, brown rice, and whole grain bread.  Eating or drinking low-fat dairy products, such as skim milk or low-fat yogurt.  Filling one quarter of your plate at each meal with low-fat (lean) proteins. Low-fat  proteins include fish, skinless chicken, eggs, beans, and tofu.  Avoiding fatty meat, cured and processed meat, or chicken with skin.  Avoiding premade or processed food.  Eat less than 1,500 mg of salt (sodium) a day.  Limit alcohol use to no more than 1 drink a day for nonpregnant women and 2 drinks a day for men. One drink equals 12 oz of beer, 5 oz of wine, or 1 oz of hard liquor. Lifestyle   Work with your doctor to stay at a healthy weight or to lose weight. Ask your doctor what the best weight is for you.  Get at least 30 minutes of exercise that causes your heart to beat faster (aerobic exercise) most days of the week. This may include walking, swimming, or biking.  Get at least 30 minutes of exercise that strengthens your muscles  (resistance exercise) at least 3 days a week. This may include lifting weights or pilates.  Do not use any products that contain nicotine or tobacco. This includes cigarettes and e-cigarettes. If you need help quitting, ask your doctor.  Check your blood pressure at home as told by your doctor.  Keep all follow-up visits as told by your doctor. This is important. Medicines   Take over-the-counter and prescription medicines only as told by your doctor. Follow directions carefully.  Do not skip doses of blood pressure medicine. The medicine does not work as well if you skip doses. Skipping doses also puts you at risk for problems.  Ask your doctor about side effects or reactions to medicines that you should watch for. Contact a doctor if:  You think you are having a reaction to the medicine you are taking.  You have headaches that keep coming back (recurring).  You feel dizzy.  You have swelling in your ankles.  You have trouble with your vision. Get help right away if:  You get a very bad headache.  You start to feel confused.  You feel weak or numb.  You feel faint.  You get very bad pain in your:  Chest.  Belly (abdomen).  You throw up (vomit) more than once.  You have trouble breathing. Summary  Hypertension is another name for high blood pressure.  Making healthy choices can help lower blood pressure. If your blood pressure cannot be controlled with healthy choices, you may need to take medicine. This information is not intended to replace advice given to you by your health care provider. Make sure you discuss any questions you have with your health care provider. Document Released: 06/19/2007 Document Revised: 11/29/2015 Document Reviewed: 11/29/2015 Elsevier Interactive Patient Education  2017 Elsevier Inc.  -  Preventing Type 2 Diabetes Mellitus Type 2 diabetes (type 2 diabetes mellitus) is a long-term (chronic) disease that affects blood sugar (glucose)  levels. Normally, a hormone called insulin allows glucose to enter cells in the body. The cells use glucose for energy. In type 2 diabetes, one or both of these problems may be present:  The body does not make enough insulin.  The body does not respond properly to insulin that it makes (insulin resistance). Insulin resistance or lack of insulin causes excess glucose to build up in the blood instead of going into cells. As a result, high blood glucose (hyperglycemia) develops, which can cause many complications. Being overweight or obese and having an inactive (sedentary) lifestyle can increase your risk for diabetes. Type 2 diabetes can be delayed or prevented by making certain nutrition and lifestyle changes. What nutrition changes  can be made?  Eat healthy meals and snacks regularly. Keep a healthy snack with you for when you get hungry between meals, such as fruit or a handful of nuts.  Eat lean meats and proteins that are low in saturated fats, such as chicken, fish, egg whites, and beans. Avoid processed meats.  Eat plenty of fruits and vegetables and plenty of grains that have not been processed (whole grains). It is recommended that you eat:  1?2 cups of fruit every day.  2?3 cups of vegetables every day.  6?8 oz of whole grains every day, such as oats, whole wheat, bulgur, brown rice, quinoa, and millet.  Eat low-fat dairy products, such as milk, yogurt, and cheese.  Eat foods that contain healthy fats, such as nuts, avocado, olive oil, and canola oil.  Drink water throughout the day. Avoid drinks that contain added sugar, such as soda or sweet tea.  Follow instructions from your health care provider about specific eating or drinking restrictions.  Control how much food you eat at a time (portion size).  Check food labels to find out the serving sizes of foods.  Use a kitchen scale to weigh amounts of foods.  Saute or steam food instead of frying it. Cook with water or  broth instead of oils or butter.  Limit your intake of:  Salt (sodium). Have no more than 1 tsp (2,400 mg) of sodium a day. If you have heart disease or high blood pressure, have less than ? tsp (1,500 mg) of sodium a day.  Saturated fat. This is fat that is solid at room temperature, such as butter or fat on meat. What lifestyle changes can be made?   Activity   Do moderate-intensity physical activity for at least 30 minutes on at least 5 days of the week, or as much as told by your health care provider.  Ask your health care provider what activities are safe for you. A mix of physical activities may be best, such as walking, swimming, cycling, and strength training.  Try to add physical activity into your day. For example:  Park in spots that are farther away than usual, so that you walk more. For example, park in a far corner of the parking lot when you go to the office or the grocery store.  Take a walk during your lunch break.  Use stairs instead of elevators or escalators. Weight Loss   Lose weight as directed. Your health care provider can determine how much weight loss is best for you and can help you lose weight safely.  If you are overweight or obese, you may be instructed to lose at least 5?7 % of your body weight. Alcohol and Tobacco      Do not use any tobacco products, such as cigarettes, chewing tobacco, and e-cigarettes. If you need help quitting, ask your health care provider. Work With [place] Provider   Have your blood glucose tested regularly, as told by your health care provider.  Discuss your risk factors and how you can reduce your risk for diabetes.  Get screening tests as told by your health care provider. You may have screening tests regularly, especially if you have certain risk factors for type 2 diabetes.  Make an appointment with a diet and nutrition specialist (registered dietitian). A registered dietitian can help you make a healthy  eating plan and can help you understand portion sizes and food labels. Why are these changes important?  It is possible to  prevent or delay type 2 diabetes and related health problems by making lifestyle and nutrition changes.  It can be difficult to recognize signs of type 2 diabetes. The best way to avoid possible damage to your body is to take actions to prevent the disease before you develop symptoms. What can happen if changes are not made?  Your blood glucose levels may keep increasing. Having high blood glucose for a long time is dangerous. Too much glucose in your blood can damage your blood vessels, heart, kidneys, nerves, and eyes.  You may develop prediabetes or type 2 diabetes. Type 2 diabetes can lead to many chronic health problems and complications, such as:  Heart disease.  Stroke.  Blindness.  Kidney disease.  Depression.  Poor circulation in the feet and legs, which could lead to surgical removal (amputation) in severe cases. Where to find support:  Ask your health care provider to recommend a registered dietitian, diabetes educator, or weight loss program.  Look for local or online weight loss groups.  Join a gym, fitness club, or outdoor activity group, such as a walking club. Where to find more information: To learn more about diabetes and diabetes prevention, visit:  American Diabetes Association (ADA): www.diabetes.[employer] of Diabetes and Digestive and Kidney Diseases: FindSpin.nl To learn more about healthy eating, visit:  The U.S. Department of Agriculture Scientist, research (physical sciences)), Choose My Plate: http://wiley-williams.com/  Office of Disease Prevention and Health Promotion (ODPHP), Dietary Guidelines: SurferLive.at Summary  You can reduce your risk for type 2 diabetes by increasing your physical activity, eating healthy foods, and losing weight as directed.  Talk with your health care  provider about your risk for type 2 diabetes. Ask about any blood tests or screening tests that you need to have. This information is not intended to replace advice given to you by your health care provider. Make sure you discuss any questions you have with your health care provider. Document Released: 04/24/2015 Document Revised: 06/08/2015 Document Reviewed: 02/21/2015 Elsevier Interactive Patient Education  2017 Elsevier Inc.  -   Chronic Obstructive Pulmonary Disease Chronic obstructive pulmonary disease (COPD) is a long-term (chronic) lung problem. When you have COPD, it is hard for air to get in and out of your lungs. The way your lungs work will never return to normal. Usually the condition gets worse over time. There are things you can do to keep yourself as healthy as possible. Your doctor may treat your condition with:  Medicines.  Quitting smoking, if you smoke.  Rehabilitation. This may involve a team of specialists.  Oxygen.  Exercise and changes to your diet.  Lung surgery.  Comfort measures (palliative care). Follow these instructions at home: Medicines   Take over-the-counter and prescription medicines only as told by your doctor.  Talk to your doctor before taking any cough or allergy medicines. You may need to avoid medicines that cause your lungs to be dry. Lifestyle   If you smoke, stop. Smoking makes the problem worse. If you need help quitting, ask your doctor.  Avoid being around things that make your breathing worse. This may include smoke, chemicals, and fumes.  Stay active, but remember to also rest.  Learn and use tips on how to relax.  Make sure you get enough sleep. Most adults need at least 7 hours a night.  Eat healthy foods. Eat smaller meals more often. Rest before meals. Controlled breathing   Learn and use tips on how to control your breathing as told by your  doctor. Try:  Breathing in (inhaling) through your nose for 1 second. Then,  pucker your lips and breath out (exhale) through your lips for 2 seconds.  Putting one hand on your belly (abdomen). Breathe in slowly through your nose for 1 second. Your hand on your belly should move out. Pucker your lips and breathe out slowly through your lips. Your hand on your belly should move in as you breathe out. Controlled coughing   Learn and use controlled coughing to clear mucus from your lungs. The steps are: 1. Lean your head a little forward. 2. Breathe in deeply. 3. Try to hold your breath for 3 seconds. 4. Keep your mouth slightly open while coughing 2 times. 5. Spit any mucus out into a tissue. 6. Rest and do the steps again 1 or 2 times as needed. General instructions   Make sure you get all the shots (vaccines) that your doctor recommends. Ask your doctor about a flu shot and a pneumonia shot.  Use oxygen therapy and therapy to help improve your lungs (pulmonary rehabilitation) if told by your doctor. If you need home oxygen therapy, ask your doctor if you should buy a tool to measure your oxygen level (oximeter).  Make a COPD action plan with your doctor. This helps you know what to do if you feel worse than usual.  Manage any other conditions you have as told by your doctor.  Avoid going outside when it is very hot, cold, or humid.  Avoid people who have a sickness you can catch (contagious).  Keep all follow-up visits as told by your doctor. This is important. Contact a doctor if:  You cough up more mucus than usual.  There is a change in the color or thickness of the mucus.  It is harder to breathe than usual.  Your breathing is faster than usual.  You have trouble sleeping.  You need to use your medicines more often than usual.  You have trouble doing your normal activities such as getting dressed or walking around the house. Get help right away if:  You have shortness of breath while resting.  You have shortness of breath that stops you  from:  Being able to talk.  Doing normal activities.  Your chest hurts for longer than 5 minutes.  Your skin color is more blue than usual.  Your pulse oximeter shows that you have low oxygen for longer than 5 minutes.  You have a fever.  You feel too tired to breathe normally. Summary  Chronic obstructive pulmonary disease (COPD) is a long-term lung problem.  The way your lungs work will never return to normal. Usually the condition gets worse over time. There are things you can do to keep yourself as healthy as possible.  Take over-the-counter and prescription medicines only as told by your doctor.  If you smoke, stop. Smoking makes the problem worse. This information is not intended to replace advice given to you by your health care provider. Make sure you discuss any questions you have with your health care provider. Document Released: 06/19/2007 Document Revised: 06/08/2015 Document Reviewed: 08/27/2012 Elsevier Interactive Patient Education  2017 [employer].

## 2016-04-24 NOTE — Progress Notes (Signed)
[name], is a 61 y.o. male  [id number]:[id number]  [id number]  DOB - 02/01/1955  Chief Complaint  Patient presents with  . COPD        Subjective:   [name] is a 61 y.o. male here today for a follow up visit, last seen 03/12/16, for copd, tob abuse, prediabetes, and elevated bp.   During that visit, he received pneumococcal 23v and tdap. He states he started feeling bad soon after.  Was seen in ED 03/26/16 for flu- like illness. Better now.  He notes still smoking, but down to 1-2 black miles cigars, does not smoke tob currently.   Notes that he is using his albuterol 3 x day due to sob/exertion.  Never started metformin for predm, but says he's trying to watch his diet bit more.  Patient has No headache, No chest pain, No abdominal pain - No Nausea, No new weakness tingling or numbness, No Cough - SOB.  No problems updated.  ALLERGIES: Allergies  Allergen Reactions  . Codeine Itching  . Penicillins Itching and Other (See Comments)    Has patient had a PCN reaction causing immediate rash, facial/tongue/throat swelling, SOB or lightheadedness with hypotension: No Has patient had a PCN reaction causing severe rash involving mucus membranes or skin necrosis: No Has patient had a PCN reaction that required hospitalization No Has patient had a PCN reaction occurring within the last 10 years: No If all of the above answers are "NO", then may proceed with Cephalosporin use.    PAST MEDICAL HISTORY: Past Medical History:  Diagnosis Date  . Asthma   . COPD (chronic obstructive pulmonary disease) ([place])   . Hypertension     MEDICATIONS AT HOME: Prior to Admission medications   Medication Sig Start Date End Date Taking? Authorizing Provider  albuterol (PROVENTIL HFA;VENTOLIN HFA) 108 (90 Base) MCG/ACT inhaler Inhale 2 puffs into the lungs every 4 (four) hours as needed for wheezing or shortness of breath. 04/24/16   Maren Reamer, MD  aspirin EC 81 MG tablet Take 1  tablet (81 mg total) by mouth daily. 04/24/16   Maren Reamer, MD  budesonide-formoterol (SYMBICORT) 80-4.5 MCG/ACT inhaler Inhale 2 puffs into the lungs 2 (two) times daily. 04/24/16   Maren Reamer, MD  buPROPion (WELLBUTRIN SR) 150 MG 12 hr tablet Take 1 tablet (150 mg total) by mouth 2 (two) times daily. Patient not taking: Reported on 03/26/2016 03/12/16   Maren Reamer, MD  Fluticasone-Salmeterol (ADVAIR) 100-50 MCG/DOSE AEPB Inhale 1 puff into the lungs 2 (two) times daily. 04/03/16   Josalyn Funches, MD  hydrochlorothiazide (MICROZIDE) 12.5 MG capsule Take 1 capsule (12.5 mg total) by mouth daily. 04/24/16   Maren Reamer, MD  metFORMIN (GLUCOPHAGE) 500 MG tablet Take 1 tablet (500 mg total) by mouth daily with breakfast. Patient not taking: Reported on 03/26/2016 03/12/16   Maren Reamer, MD  senna-docusate (SENOKOT-S) 8.6-50 MG tablet Take 1 tablet by mouth 2 (two) times daily. Patient not taking: Reported on 03/26/2016 03/12/16   Maren Reamer, MD     Objective:   Vitals:   04/24/16 0937  BP: (!) 134/92  Pulse: 95  Resp: 16  Temp: 98.1 F (36.7 C)  TempSrc: Oral  SpO2: 95%  Weight: 246 lb 12.8 oz (111.9 kg)    Exam General appearance : Awake, alert, not in any distress. Speech Clear. Not toxic looking, pleasant. HEENT: Atraumatic and Normocephalic, pupils equally reactive to light. Neck: supple, no JVD.  No cervical lymphadenopathy.  Chest:Good air entry bilaterally, no added sounds. CVS: S1 S2 regular, no murmurs/gallups or rubs. Abdomen: Bowel sounds active, Non tender and not distended with no gaurding, rigidity or rebound. Extremities: B/L Lower Ext shows no edema, both legs are warm to touch Neurology: Awake alert, and oriented X 3, CN II-XII grossly intact, Non focal Skin:No Rash  Data Review Lab Results  Component Value Date   HGBA1C 5.7 03/12/2016    Depression screen [facility] 2/9 03/12/2016 07/15/2013  Decreased Interest 0 0  Down, Depressed, Hopeless  0 0  PHQ - 2 Score 0 0      Assessment & Plan   1. COPD mixed type ([place]) - using albuterol mdi 3x day., uncontrolled - albuterol (PROVENTIL HFA;VENTOLIN HFA) 108 (90 Base) MCG/ACT inhaler; Inhale 2 puffs into the lungs every 4 (four) hours as needed for wheezing or shortness of breath.  Dispense: 54 g; Refill: 3 - start symbicort bid, dc advair to see if works better  2. Prediabetes - predm diet encouraged, advised to try metformin - may help him from dev dm since family hx signif dm. - POCT glucose (manual entry) - ASCVD risk score >20% over 10 years, risk of gi bleeding on asa 1.2% over 10 years, advised pt to start daily ASA 81 for protection.  3. HTN (hypertension), benign Persistently remains elavated after numerous readings, he is hesitant about starting new meds. - started hctz 12.5 qd for now, low salt diet encouraged. - Lipid Panel  4. Smoking abuse  advised complete cessation.   Patient have been counseled extensively about nutrition and exercise  Return in about 3 months (around 07/24/2016), or if symptoms worsen or fail to improve.  The patient was given clear instructions to go to ER or return to medical center if symptoms don't improve, worsen or new problems develop. The patient verbalized understanding. The patient was told to call to get lab results if they haven't heard anything in the next week.   This note has been created with Surveyor, quantity. Any transcriptional errors are unintentional.   Maren Reamer, MD, [facility] [place], [place]   04/24/2016, 11:16 AM

## 2016-04-25 LAB — LIPID PANEL
CHOL/HDL RATIO: 4.5 ratio (ref 0.0–5.0)
Cholesterol, Total: 180 mg/dL (ref 100–199)
HDL: 40 mg/dL (ref >39)
LDL Calculated: 122 mg/dL — ABNORMAL HIGH (ref 0–99)
Triglycerides: 92 mg/dL (ref 0–149)
VLDL CHOLESTEROL CAL: 18 mg/dL (ref 5–40)

## 2016-04-26 ENCOUNTER — Other Ambulatory Visit: Payer: Self-pay | Admitting: Internal Medicine

## 2016-04-26 MED ORDER — PRAVASTATIN SODIUM 20 MG PO TABS: 10.0000 mg | ORAL_TABLET | Freq: Every day | ORAL | 3 refills | Status: DC

## 2016-04-26 MED FILL — PRAVASTATIN NA 20 MG TAB: 20 | 30 days supply | Qty: 15 | Fill #0

## 2016-05-03 ENCOUNTER — Telehealth: Payer: Self-pay

## 2016-05-03 NOTE — Telephone Encounter (Signed)
Contacted pt go over lab results pt is aware of results and doesn't have any questions or concerns 

## 2016-05-06 MED FILL — $VENTOLIN HFA 18G INHALER: 108 (90 BAS | 30 days supply | Qty: 18 | Fill #0

## 2016-05-06 MED FILL — $ADVAIR 100/50 MCG INHALER: 100-50 | 30 days supply | Qty: 60 | Fill #0

## 2016-05-28 MED FILL — $VENTOLIN HFA 18G INHALER: 108 (90 BAS | 30 days supply | Qty: 18 | Fill #2

## 2016-05-28 MED FILL — $ADVAIR 100/50 MCG INHALER: 100-50 | 30 days supply | Qty: 60 | Fill #1

## 2016-05-30 ENCOUNTER — Encounter: Payer: Self-pay | Admitting: Internal Medicine

## 2016-05-31 ENCOUNTER — Encounter: Payer: Self-pay | Admitting: Internal Medicine

## 2016-06-03 ENCOUNTER — Encounter: Payer: Self-pay | Admitting: Internal Medicine

## 2016-07-24 ENCOUNTER — Encounter: Payer: Self-pay | Admitting: Family Medicine

## 2016-07-24 ENCOUNTER — Ambulatory Visit: Payer: Self-pay | Attending: Family Medicine | Admitting: Family Medicine

## 2016-07-24 VITALS — BP 143/83 | HR 89 | Temp 98.2°F | Resp 18 | Ht 70.0 in | Wt 229.0 lb

## 2016-07-24 DIAGNOSIS — R7303 Prediabetes: Secondary | ICD-10-CM | POA: Insufficient documentation

## 2016-07-24 DIAGNOSIS — I10 Essential (primary) hypertension: Secondary | ICD-10-CM

## 2016-07-24 DIAGNOSIS — J449 Chronic obstructive pulmonary disease, unspecified: Secondary | ICD-10-CM

## 2016-07-24 DIAGNOSIS — Z7984 Long term (current) use of oral hypoglycemic drugs: Secondary | ICD-10-CM | POA: Insufficient documentation

## 2016-07-24 DIAGNOSIS — F17200 Nicotine dependence, unspecified, uncomplicated: Secondary | ICD-10-CM

## 2016-07-24 DIAGNOSIS — E7800 Pure hypercholesterolemia, unspecified: Secondary | ICD-10-CM

## 2016-07-24 DIAGNOSIS — Z87898 Personal history of other specified conditions: Secondary | ICD-10-CM

## 2016-07-24 DIAGNOSIS — Z7982 Long term (current) use of aspirin: Secondary | ICD-10-CM | POA: Insufficient documentation

## 2016-07-24 LAB — POCT GLYCOSYLATED HEMOGLOBIN (HGB A1C): HEMOGLOBIN A1C: 5.7

## 2016-07-24 LAB — GLUCOSE, POCT (MANUAL RESULT ENTRY): POC Glucose: 96 mg/dl (ref 70–99)

## 2016-07-24 MED ORDER — AMLODIPINE BESYLATE 5 MG PO TABS: 5.0000 mg | ORAL_TABLET | Freq: Every day | ORAL | 2 refills | Status: DC

## 2016-07-24 MED ORDER — HYDROCHLOROTHIAZIDE 25 MG PO TABS: 25.0000 mg | ORAL_TABLET | Freq: Every day | ORAL | 2 refills | Status: DC

## 2016-07-24 MED ORDER — ALBUTEROL SULFATE HFA 108 (90 BASE) MCG/ACT IN AERS: 2.0000 | INHALATION_SPRAY | RESPIRATORY_TRACT | 3 refills | Status: DC | PRN

## 2016-07-24 MED ORDER — METFORMIN HCL 500 MG PO TABS: 500.0000 mg | ORAL_TABLET | Freq: Every day | ORAL | 2 refills | Status: DC

## 2016-07-24 MED ORDER — BUPROPION HCL ER (SR) 150 MG PO TB12: 150.0000 mg | ORAL_TABLET | Freq: Two times a day (BID) | ORAL | 3 refills | Status: DC

## 2016-07-24 MED ORDER — FLUTICASONE PROPIONATE HFA 44 MCG/ACT IN AERO: 1.0000 | INHALATION_SPRAY | Freq: Two times a day (BID) | RESPIRATORY_TRACT | 3 refills | Status: DC

## 2016-07-24 MED ORDER — ASPIRIN EC 81 MG PO TBEC: 81.0000 mg | DELAYED_RELEASE_TABLET | Freq: Every day | ORAL | 3 refills | Status: DC

## 2016-07-24 MED ORDER — PRAVASTATIN SODIUM 10 MG PO TABS: 10.0000 mg | ORAL_TABLET | Freq: Every day | ORAL | 2 refills | Status: DC

## 2016-07-24 MED FILL — HYDROCHLOROTHIAZIDE 25 MG T: 25 | 30 days supply | Qty: 30 | Fill #0

## 2016-07-24 MED FILL — metFORMIN HCL 500 MG TABS: 500 | 30 days supply | Qty: 30 | Fill #0

## 2016-07-24 MED FILL — PRAVASTATIN NA 10 MG TAB: 10 | 30 days supply | Qty: 30 | Fill #0

## 2016-07-24 MED FILL — $VENTOLIN HFA 18G INHALER: 108 (90 BAS | 25 days supply | Qty: 18 | Fill #0

## 2016-07-24 MED FILL — BUPROPION SR 150 MG TABLET: 150 | 30 days supply | Qty: 60 | Fill #0

## 2016-07-24 MED FILL — !FLOVENT HFA 44 MCG INHALER: 44 MCG | 60 days supply | Qty: 1 | Fill #0

## 2016-07-24 MED FILL — AMLODIPINE BESYLATE 5 MG TA: 5 | 30 days supply | Qty: 30 | Fill #0

## 2016-07-24 NOTE — Patient Instructions (Signed)

## 2016-07-24 NOTE — Progress Notes (Signed)
Patient is here for HTN/COPD

## 2016-07-24 NOTE — Progress Notes (Signed)
Subjective:  Patient ID: [name], male    DOB: April 11, 1955  Age: 61 y.o. MRN: [id number]  CC: Hypertension   HPI  [name] presents for follow up. PHM includes COPD, Pre-Diabetes, and HTN. He denies any symptoms acute dyspnea, wheezing and cough . Patient currently is not on home oxygen therapy.Marland Kitchen Respiratory history: asthma and COPD. He reports being without his albuterol inhalers for 3 weeks. When asked about Symbicort use patient states " When I seen the commercial that said may cause death in asthma patient, "poof" in the trash it went". He reports non-adherence with all of his current medications. Denies any CP, SOB, or BLE swelling.    Outpatient Medications Prior to Visit  Medication Sig Dispense Refill  . albuterol (PROVENTIL HFA;VENTOLIN HFA) 108 (90 Base) MCG/ACT inhaler Inhale 2 puffs into the lungs every 4 (four) hours as needed for wheezing or shortness of breath. 54 g 3  . aspirin EC 81 MG tablet Take 1 tablet (81 mg total) by mouth daily. 90 tablet 3  . senna-docusate (SENOKOT-S) 8.6-50 MG tablet Take 1 tablet by mouth 2 (two) times daily. (Patient not taking: Reported on 03/26/2016) 60 tablet 3  . budesonide-formoterol (SYMBICORT) 80-4.5 MCG/ACT inhaler Inhale 2 puffs into the lungs 2 (two) times daily. (Patient not taking: Reported on 07/24/2016) 1 Inhaler 3  . buPROPion (WELLBUTRIN SR) 150 MG 12 hr tablet Take 1 tablet (150 mg total) by mouth 2 (two) times daily. (Patient not taking: Reported on 03/26/2016) 60 tablet 3  . hydrochlorothiazide (MICROZIDE) 12.5 MG capsule Take 1 capsule (12.5 mg total) by mouth daily. (Patient not taking: Reported on 07/24/2016) 90 capsule 3  . metFORMIN (GLUCOPHAGE) 500 MG tablet Take 1 tablet (500 mg total) by mouth daily with breakfast. (Patient not taking: Reported on 03/26/2016) 90 tablet 3  . pravastatin (PRAVACHOL) 20 MG tablet Take 0.5 tablets (10 mg total) by mouth daily. (Patient not taking: Reported on 07/24/2016) 45  tablet 3   No facility-administered medications prior to visit.     ROS Review of Systems  Constitutional: Negative.   Eyes: Negative.  Negative for visual disturbance.  Respiratory: Negative.   Cardiovascular: Negative.   Gastrointestinal: Negative.   Endocrine: Negative.     Objective:  BP (!) 143/83 (BP Location: Left Arm, Patient Position: Sitting, Cuff Size: Normal)   Pulse 89   Temp 98.2 F (36.8 C) (Oral)   Resp 18   Ht 5\' 10"  (1.778 m)   Wt 229 lb (103.9 kg)   SpO2 95%   BMI 32.86 kg/m   BP/Weight 07/24/2016 04/24/2016 2/50/5397  Systolic BP 673 419 379  Diastolic BP 83 92 75  Wt. (Lbs) 229 246.8 245  BMI 32.86 36.45 36.18    Physical Exam  Constitutional: He appears well-developed and well-nourished.  Eyes: Conjunctivae are normal. Pupils are equal, round, and reactive to light.  Neck: No JVD present.  Cardiovascular: Normal rate, regular rhythm, normal heart sounds and intact distal pulses.   Pulmonary/Chest: Effort normal and breath sounds normal. He has no wheezes.  Abdominal: Soft. Bowel sounds are normal. There is no tenderness.  Skin: Skin is warm and dry.  Psychiatric: His affect is inappropriate. He expresses no homicidal and no suicidal ideation. He expresses no suicidal plans and no homicidal plans.  Nursing note and vitals reviewed.   Assessment & Plan:   Problem List Items Addressed This Visit      Cardiovascular and Mediastinum   Essential hypertension - Primary  Schedule BP recheck in 2 weeks with clinic RN.   If BP is greater than 90/60 (MAP 65 or greater) but not less than 130/80 may increase    dose of  amlodipine to 10 mg QDand recheck in another 2 weeks with clinic RN   Relevant Medications   aspirin EC 81 MG tablet   pravastatin (PRAVACHOL) 10 MG tablet   amLODipine (NORVASC) 5 MG tablet   hydrochlorothiazide (HYDRODIURIL) 25 MG tablet    Other Visit Diagnoses    Chronic obstructive pulmonary disease, unspecified COPD type (HCC)        Relevant Medications   albuterol (PROVENTIL HFA;VENTOLIN HFA) 108 (90 Base) MCG/ACT inhaler   fluticasone (FLOVENT HFA) 44 MCG/ACT inhaler   History of prediabetes       Relevant Medications   metFORMIN (GLUCOPHAGE) 500 MG tablet   Other Relevant Orders   POCT glycosylated hemoglobin (Hb A1C) (Completed)   Glucose (CBG) (Completed)   COPD mixed type (HCC)       Relevant Medications   albuterol (PROVENTIL HFA;VENTOLIN HFA) 108 (90 Base) MCG/ACT inhaler   fluticasone (FLOVENT HFA) 44 MCG/ACT inhaler   Ready to quit smoking       Relevant Medications   buPROPion (WELLBUTRIN SR) 150 MG 12 hr tablet   Elevated LDL cholesterol level       Relevant Medications   pravastatin (PRAVACHOL) 10 MG tablet      Meds ordered this encounter  Medications  . albuterol (PROVENTIL HFA;VENTOLIN HFA) 108 (90 Base) MCG/ACT inhaler    Sig: Inhale 2 puffs into the lungs every 4 (four) hours as needed for wheezing or shortness of breath.    Dispense:  54 g    Refill:  3    Order Specific Question:   Supervising Provider    Answer:   Tresa Garter [id number]  . metFORMIN (GLUCOPHAGE) 500 MG tablet    Sig: Take 1 tablet (500 mg total) by mouth daily with breakfast.    Dispense:  30 tablet    Refill:  2    Order Specific Question:   Supervising Provider    Answer:   Tresa Garter [id number]  . fluticasone (FLOVENT HFA) 44 MCG/ACT inhaler    Sig: Inhale 1 puff into the lungs 2 (two) times daily.    Dispense:  1 Inhaler    Refill:  3    Order Specific Question:   Supervising Provider    Answer:   Tresa Garter [id number]  . aspirin EC 81 MG tablet    Sig: Take 1 tablet (81 mg total) by mouth daily.    Dispense:  90 tablet    Refill:  3    Order Specific Question:   Supervising Provider    Answer:   Tresa Garter [id number]  . buPROPion (WELLBUTRIN SR) 150 MG 12 hr tablet    Sig: Take 1 tablet (150 mg total) by mouth 2 (two) times daily.    Dispense:  60 tablet     Refill:  3    Order Specific Question:   Supervising Provider    Answer:   Tresa Garter [id number]  . pravastatin (PRAVACHOL) 10 MG tablet    Sig: Take 1 tablet (10 mg total) by mouth daily.    Dispense:  30 tablet    Refill:  2    Order Specific Question:   Supervising Provider    Answer:   Tresa Garter [id number]    Follow-up: Return in about  2 weeks (around 08/07/2016) for BP check with clinic RN.   Alfonse Spruce FNP

## 2016-09-30 MED FILL — !FLOVENT HFA 44 MCG INHALER: 44 MCG | 60 days supply | Qty: 1 | Fill #1

## 2016-09-30 MED FILL — $VENTOLIN HFA 18G INHALER: 108 (90 BAS | 25 days supply | Qty: 18 | Fill #1

## 2016-11-06 ENCOUNTER — Ambulatory Visit (HOSPITAL_COMMUNITY)
Admission: EM | Admit: 2016-11-06 | Discharge: 2016-11-06 | Disposition: A | Discharge status: Home / Self Care | Payer: Self-pay | Attending: Family Medicine | Admitting: Family Medicine

## 2016-11-06 ENCOUNTER — Encounter (HOSPITAL_COMMUNITY): Payer: Self-pay | Admitting: Emergency Medicine

## 2016-11-06 DIAGNOSIS — K0889 Other specified disorders of teeth and supporting structures: Secondary | ICD-10-CM

## 2016-11-06 MED ORDER — CLINDAMYCIN HCL 300 MG PO CAPS: 300.0000 mg | ORAL_CAPSULE | Freq: Three times a day (TID) | ORAL | 0 refills | Status: DC

## 2016-11-06 MED ORDER — HYDROCODONE-ACETAMINOPHEN 5-325 MG PO TABS: 1.0000 | ORAL_TABLET | Freq: Four times a day (QID) | ORAL | 0 refills | Status: DC | PRN

## 2016-11-06 NOTE — ED Triage Notes (Signed)
PT reports dental abscess on upper jaw. PT reports he has had this issue at 2nd and 3rd bicuspids multiple times. PT reports abscess started 4 days ago.

## 2016-11-07 NOTE — ED Provider Notes (Signed)
[name]   [id number] 11/06/16 Arrival Time: [place]:  1. Pain, dental     Meds ordered this encounter  Medications  . HYDROcodone-acetaminophen (NORCO/VICODIN) 5-325 MG tablet    Sig: Take 1 tablet by mouth every 6 (six) hours as needed for moderate pain or severe pain.    Dispense:  6 tablet    Refill:  0  . clindamycin (CLEOCIN) 300 MG capsule    Sig: Take 1 capsule (300 mg total) by mouth 3 (three) times daily.    Dispense:  30 capsule    Refill:  0    [place] Controlled Substances Registry consulted for this patient. I feel the risk/benefit ratio today is favorable for proceeding with this prescription for a controlled substance. Medication sedation precautions given. OTC NSAID.  Dental resource written instructions given. He will schedule dental evaluation as soon as possible.  Reviewed expectations re: course of current medical issues. Questions answered. Outlined signs and symptoms indicating need for more acute intervention. Patient verbalized understanding. After Visit Summary given.   SUBJECTIVE:  [name] is a 61 y.o. male who reports gradual onset of right upper dental pain. Present for 4 days. H/O similar in the past. Decreased PO intake secondary to dental discomfort. Afebrile. OTC NSAID without much relief. Does not see a dentist regularly. No neck pain or swelling.  ROS: As per HPI.  OBJECTIVE:  Vitals:   11/06/16 1330 11/06/16 1332  BP:  133/85  Pulse:  94  Resp:  16  Temp:  98.5 F (36.9 C)  TempSrc:  Oral  SpO2:  95%  Weight: 240 lb (108.9 kg)   Height: 5\' 9"  (1.753 m)     General appearance: alert; no distress HENT: normocephalic; atraumatic; dentition: fair; gingival hypertrophy or R upper gum; no fluctuance Neck: supple without LAD Lungs: normal respirations Skin: warm and dry Psychological: alert and cooperative; normal mood and affect  Allergies  Allergen Reactions  . Codeine Itching  .  Penicillins Itching and Other (See Comments)    Has patient had a PCN reaction causing immediate rash, facial/tongue/throat swelling, SOB or lightheadedness with hypotension: No Has patient had a PCN reaction causing severe rash involving mucus membranes or skin necrosis: No Has patient had a PCN reaction that required hospitalization No Has patient had a PCN reaction occurring within the last 10 years: No If all of the above answers are "NO", then may proceed with Cephalosporin use.    Past Medical History:  Diagnosis Date  . Asthma   . COPD (chronic obstructive pulmonary disease) ([place])   . Hypertension    Social History   Social History  . Marital status: Married    Spouse name: N/A  . Number of children: N/A  . Years of education: N/A   Occupational History  . Not on file.   Social History Main Topics  . Smoking status: Current Some Day Smoker    Packs/day: 1.00    Years: 39.00    Types: Cigarettes  . Smokeless tobacco: Never Used  . Alcohol use Yes     Comment: "every once in awhile"  . Drug use: No     Comment: occasinally   . Sexual activity: Not on file   Other Topics Concern  . Not on file   Social History Narrative  . No narrative on file   Family History  Problem Relation Age of Onset  . Hypertension Paternal Uncle   . Cancer Maternal Grandmother   .  Hypertension Maternal Grandfather    Past Surgical History:  Procedure Laterality Date  . HAND SURGERY       Vanessa Kick, MD 11/07/16 1006

## 2016-11-18 ENCOUNTER — Encounter (HOSPITAL_COMMUNITY): Payer: Self-pay | Admitting: *Deleted

## 2016-11-18 ENCOUNTER — Other Ambulatory Visit: Payer: Self-pay

## 2016-11-18 ENCOUNTER — Emergency Department (HOSPITAL_COMMUNITY): Payer: Self-pay

## 2016-11-18 ENCOUNTER — Emergency Department (HOSPITAL_COMMUNITY)
Admission: EM | Admit: 2016-11-18 | Discharge: 2016-11-18 | Disposition: A | Discharge status: Home / Self Care | Payer: Self-pay | Attending: Emergency Medicine | Admitting: Emergency Medicine

## 2016-11-18 DIAGNOSIS — R070 Pain in throat: Secondary | ICD-10-CM | POA: Insufficient documentation

## 2016-11-18 DIAGNOSIS — Z79899 Other long term (current) drug therapy: Secondary | ICD-10-CM | POA: Insufficient documentation

## 2016-11-18 DIAGNOSIS — F17210 Nicotine dependence, cigarettes, uncomplicated: Secondary | ICD-10-CM | POA: Insufficient documentation

## 2016-11-18 DIAGNOSIS — I10 Essential (primary) hypertension: Secondary | ICD-10-CM | POA: Insufficient documentation

## 2016-11-18 DIAGNOSIS — J449 Chronic obstructive pulmonary disease, unspecified: Secondary | ICD-10-CM | POA: Insufficient documentation

## 2016-11-18 DIAGNOSIS — Z7984 Long term (current) use of oral hypoglycemic drugs: Secondary | ICD-10-CM | POA: Insufficient documentation

## 2016-11-18 DIAGNOSIS — Z7982 Long term (current) use of aspirin: Secondary | ICD-10-CM | POA: Insufficient documentation

## 2016-11-18 DIAGNOSIS — R0602 Shortness of breath: Secondary | ICD-10-CM | POA: Insufficient documentation

## 2016-11-18 DIAGNOSIS — J029 Acute pharyngitis, unspecified: Secondary | ICD-10-CM

## 2016-11-18 LAB — CBC WITH DIFFERENTIAL/PLATELET
Basophils Absolute: 0 10*3/uL (ref 0.0–0.1)
Basophils Relative: 0 %
EOS PCT: 0 %
Eosinophils Absolute: 0 10*3/uL (ref 0.0–0.7)
HCT: 45.5 % (ref 39.0–52.0)
HEMOGLOBIN: 15.6 g/dL (ref 13.0–17.0)
LYMPHS ABS: 1.6 10*3/uL (ref 0.7–4.0)
LYMPHS PCT: 27 %
MCH: 32.4 pg (ref 26.0–34.0)
MCHC: 34.3 g/dL (ref 30.0–36.0)
MCV: 94.6 fL (ref 78.0–100.0)
MONOS PCT: 18 %
Monocytes Absolute: 1.1 10*3/uL — ABNORMAL HIGH (ref 0.1–1.0)
NEUTROS PCT: 55 %
Neutro Abs: 3.3 10*3/uL (ref 1.7–7.7)
Platelets: 157 10*3/uL (ref 150–400)
RBC: 4.81 MIL/uL (ref 4.22–5.81)
RDW: 13.9 % (ref 11.5–15.5)
WBC: 6 10*3/uL (ref 4.0–10.5)

## 2016-11-18 LAB — COMPREHENSIVE METABOLIC PANEL
ALT: 19 U/L (ref 17–63)
AST: 18 U/L (ref 15–41)
Albumin: 3.9 g/dL (ref 3.5–5.0)
Alkaline Phosphatase: 94 U/L (ref 38–126)
Anion gap: 8 (ref 5–15)
BUN: 9 mg/dL (ref 6–20)
CHLORIDE: 103 mmol/L (ref 101–111)
CO2: 26 mmol/L (ref 22–32)
Calcium: 8.8 mg/dL — ABNORMAL LOW (ref 8.9–10.3)
Creatinine, Ser: 0.91 mg/dL (ref 0.61–1.24)
GFR calc Af Amer: >60 mL/min (ref >60)
Glucose, Bld: 109 mg/dL — ABNORMAL HIGH (ref 65–99)
POTASSIUM: 3.9 mmol/L (ref 3.5–5.1)
SODIUM: 137 mmol/L (ref 135–145)
Total Bilirubin: 0.7 mg/dL (ref 0.3–1.2)
Total Protein: 7 g/dL (ref 6.5–8.1)

## 2016-11-18 LAB — I-STAT CG4 LACTIC ACID, ED: LACTIC ACID, VENOUS: 0.96 mmol/L (ref 0.5–1.9)

## 2016-11-18 MED ORDER — ALBUTEROL SULFATE (2.5 MG/3ML) 0.083% IN NEBU
5.0000 mg | INHALATION_SOLUTION | Freq: Once | RESPIRATORY_TRACT | Status: AC
  Administered 2016-11-18: 5 mg via RESPIRATORY_TRACT

## 2016-11-18 MED ORDER — PREDNISONE 20 MG PO TABS
60.0000 mg | ORAL_TABLET | Freq: Once | ORAL | Status: AC
  Administered 2016-11-18: 60 mg via ORAL
  Filled 2016-11-18: qty 3

## 2016-11-18 MED ORDER — IPRATROPIUM BROMIDE 0.02 % IN SOLN
0.5000 mg | Freq: Once | RESPIRATORY_TRACT | Status: AC
  Administered 2016-11-18: 0.5 mg via RESPIRATORY_TRACT
  Filled 2016-11-18: qty 2.5

## 2016-11-18 MED ORDER — ALBUTEROL SULFATE HFA 108 (90 BASE) MCG/ACT IN AERS
1.0000 | INHALATION_SPRAY | RESPIRATORY_TRACT | Status: DC | PRN
  Filled 2016-11-18: qty 6.7

## 2016-11-18 MED ORDER — PREDNISONE 10 MG PO TABS: 40.0000 mg | ORAL_TABLET | Freq: Every day | ORAL | 0 refills | Status: AC

## 2016-11-18 MED ORDER — ALBUTEROL SULFATE (2.5 MG/3ML) 0.083% IN NEBU
5.0000 mg | INHALATION_SOLUTION | Freq: Once | RESPIRATORY_TRACT | Status: AC
  Administered 2016-11-18: 5 mg via RESPIRATORY_TRACT
  Filled 2016-11-18: qty 6

## 2016-11-18 MED ORDER — ALBUTEROL SULFATE (2.5 MG/3ML) 0.083% IN NEBU
INHALATION_SOLUTION | RESPIRATORY_TRACT | Status: AC
  Filled 2016-11-18: qty 6

## 2016-11-18 MED FILL — ?PREDNISONE 10 MG TABLET: 10 | 4 days supply | Qty: 16 | Fill #0

## 2016-11-18 MED FILL — $VENTOLIN HFA 18G INHALER: 108 (90 BAS | 25 days supply | Qty: 18 | Fill #2

## 2016-11-18 MED FILL — !FLOVENT HFA 44 MCG INHALER: 44 MCG | 60 days supply | Qty: 1 | Fill #2

## 2016-11-18 NOTE — ED Triage Notes (Signed)
Pt is here with sob and states hard to breath since Thursday and states he borrowed a friends inhaler cause he ran out of his.  Pt is leaning forward and states had chills on Friday.  Pt has congested cough and requesting nebulizer.  Pt alert and talkative

## 2016-11-18 NOTE — ED Provider Notes (Signed)
[facility] EMERGENCY DEPARTMENT Provider Note   CSN: [id number] Arrival date & time: 11/18/16  0945     History   Chief Complaint Chief Complaint  Patient presents with  . Shortness of Breath    HPI [name] is a 61 y.o. male with history of hypertension, COPD, asthma, and tobacco abuse who presents today with chief complaint acute onset, progressively improving shortness of breath for 5 days.  Endorses a congested cough but has not been able to cough anything up.  Denies hemoptysis, recent travel or surgeries, testosterone placement therapy, or prior history of DVT or PE.  Denies chest pain, palpitations, or leg swelling.  Endorses subjective fevers and chills over the weekend which have resolved.  He states that he has used all of his nebulizers and inhalers and even used his girlfriend and other family members inhalers.  Inhalers provide temporary relief.  No aggravating or alleviating factors noted.  He states that he smokes 1 pack/day but states "I only smoke about half a cigarette before I throw it away".  Also endorses mild sore throat over the past 5 days. No known sick contacts. Has not tried anything else for his symptoms.  He received a breathing treatment prior to my assessment which he states was helpful.  During my examination he states "we better get this under control because there is no way I am staying in the hospital ".   The history is provided by the patient.    Past Medical History:  Diagnosis Date  . Asthma   . COPD (chronic obstructive pulmonary disease) ([place])   . Hypertension     Patient Active Problem List   Diagnosis Date Noted  . Diabetes mellitus screening 03/12/2016  . Essential hypertension 10/22/2013  . Elevated BP 07/19/2013  . Obesity 02/04/2012  . Acute respiratory failure with hypoxia ([place]) 02/04/2012  . COPD with exacerbation ([place]) 02/03/2012  . Community acquired pneumonia 02/03/2012  . Tobacco abuse 02/03/2012      Past Surgical History:  Procedure Laterality Date  . HAND SURGERY         Home Medications    Prior to Admission medications   Medication Sig Start Date End Date Taking? Authorizing Provider  albuterol (PROVENTIL HFA;VENTOLIN HFA) 108 (90 Base) MCG/ACT inhaler Inhale 2 puffs into the lungs every 4 (four) hours as needed for wheezing or shortness of breath. 07/24/16  Yes Hairston, Mandesia R, FNP  fluticasone (FLOVENT HFA) 44 MCG/ACT inhaler Inhale 1 puff into the lungs 2 (two) times daily. 07/24/16  Yes Alfonse Spruce, FNP  metFORMIN (GLUCOPHAGE) 500 MG tablet Take 1 tablet (500 mg total) by mouth daily with breakfast. 07/24/16  Yes Hairston, Mandesia R, FNP  pravastatin (PRAVACHOL) 10 MG tablet Take 1 tablet (10 mg total) by mouth daily. 07/24/16  Yes Hairston, Mandesia R, FNP  senna-docusate (SENOKOT-S) 8.6-50 MG tablet Take 1 tablet by mouth 2 (two) times daily. 03/12/16  Yes Langeland, Dawn T, MD  amLODipine (NORVASC) 5 MG tablet Take 1 tablet (5 mg total) by mouth daily. 07/24/16   Alfonse Spruce, FNP  aspirin EC 81 MG tablet Take 1 tablet (81 mg total) by mouth daily. 07/24/16   Alfonse Spruce, FNP  buPROPion (WELLBUTRIN SR) 150 MG 12 hr tablet Take 1 tablet (150 mg total) by mouth 2 (two) times daily. 07/24/16   Alfonse Spruce, FNP  clindamycin (CLEOCIN) 300 MG capsule Take 1 capsule (300 mg total) by mouth 3 (three) times  daily. Patient not taking: Reported on 11/18/2016 11/06/16   Vanessa Kick, MD  hydrochlorothiazide (HYDRODIURIL) 25 MG tablet Take 1 tablet (25 mg total) by mouth daily. Patient not taking: Reported on 11/18/2016 07/24/16   Alfonse Spruce, FNP  HYDROcodone-acetaminophen (NORCO/VICODIN) 5-325 MG tablet Take 1 tablet by mouth every 6 (six) hours as needed for moderate pain or severe pain. 11/06/16   Vanessa Kick, MD  predniSONE (DELTASONE) 10 MG tablet Take 4 tablets (40 mg total) daily with breakfast for 4 days by mouth. 11/18/16 11/22/16   Renita Papa, PA-C    Family History Family History  Problem Relation Age of Onset  . Hypertension Paternal Uncle   . Cancer Maternal Grandmother   . Hypertension Maternal Grandfather     Social History Social History   Tobacco Use  . Smoking status: Current Some Day Smoker    Packs/day: 1.00    Years: 39.00    Pack years: 39.00    Types: Cigarettes  . Smokeless tobacco: Never Used  Substance Use Topics  . Alcohol use: Yes    Comment: "every once in awhile"  . Drug use: No    Comment: occasinally      Allergies   Codeine and Penicillins   Review of Systems Review of Systems  Constitutional: Positive for chills and fever.  HENT: Positive for sore throat. Negative for congestion and trouble swallowing.   Respiratory: Positive for cough and shortness of breath.   Cardiovascular: Negative for chest pain, palpitations and leg swelling.  Gastrointestinal: Negative for abdominal pain, nausea and vomiting.  All other systems reviewed and are negative.    Physical Exam Updated Vital Signs BP 124/85   Pulse 100   Temp 98.7 F (37.1 C) (Oral)   Resp (!) 26   SpO2 100%   Physical Exam  Constitutional: He appears well-developed and well-nourished. No distress.  HENT:  Head: Normocephalic and atraumatic.  No tenderness to palpation of the frontal or maxillary sinuses.  TMs without erythema or bulging.  Nasal septum midline.  Posterior pharynx with very mild erythema, no tonsillar hypertrophy, uvular deviation, or exudates.  Eyes: Conjunctivae are normal. Right eye exhibits no discharge. Left eye exhibits no discharge.  Neck: Normal range of motion. Neck supple. No JVD present. No tracheal deviation present.  Cardiovascular: Intact distal pulses.  Tachycardic, 2+ radial and DP/PT pulses bl, negative Homan's bl   Pulmonary/Chest: Effort normal. Tachypnea noted. No respiratory distress. He has decreased breath sounds. He has wheezes.  Scattered rhonchi and diffuse  expiratory wheezing in anterior and posterior lung fields.  Equal rise and fall of chest.  He is tachypneic and speaking in somewhat short sentences.  Abdominal: Soft. Bowel sounds are normal. He exhibits no distension. There is no tenderness.  Musculoskeletal: He exhibits no edema.       Right lower leg: He exhibits no tenderness and no edema.       Left lower leg: He exhibits no tenderness and no edema.  Neurological: He is alert.  Skin: Skin is warm and dry. No erythema.  Psychiatric: He has a normal mood and affect. His behavior is normal.  Nursing note and vitals reviewed.    ED Treatments / Results  Labs (all labs ordered are listed, but only abnormal results are displayed) Labs Reviewed  COMPREHENSIVE METABOLIC PANEL - Abnormal; Notable for the following components:      Result Value   Glucose, Bld 109 (*)    Calcium 8.8 (*)  All other components within normal limits  CBC WITH DIFFERENTIAL/PLATELET - Abnormal; Notable for the following components:   Monocytes Absolute 1.1 (*)    All other components within normal limits  URINALYSIS, ROUTINE W REFLEX MICROSCOPIC  I-STAT CG4 LACTIC ACID, ED  I-STAT CG4 LACTIC ACID, ED    EKG  EKG Interpretation  Date/Time:  Monday November 18 2016 09:52:23 EST Ventricular Rate:  110 PR Interval:  118 QRS Duration: 78 QT Interval:  314 QTC Calculation: 424 R Axis:   70 Text Interpretation:  Sinus tachycardia Otherwise normal ECG No significant change since last tracing Confirmed by Deno Etienne [phone]) on 11/18/2016 12:15:23 PM       Radiology Dg Chest 2 View  Result Date: 11/18/2016 CLINICAL DATA:  Dyspnea and shortness of breath for the past 4 days since running out is inhaler. Onset of chills 3 days ago. History of asthma- COPD, community-acquired pneumonia, current smoker. EXAM: CHEST  2 VIEW COMPARISON:  Chest x-ray of March 26, 2016 FINDINGS: The lungs are mildly hyperinflated. The interstitial markings are coarse but less  conspicuous than on the previous study. The heart and pulmonary vascularity are normal. The mediastinum is normal in width. There is gentle dextrocurvature centered in the mid to lower thoracic spine. There is multilevel degenerative disc disease of the thoracic spine. IMPRESSION: Chronic changes of asthma -COPD in the patient's smoking history. No pneumonia, CHF, nor other acute cardiopulmonary abnormality. Electronically Signed   By: David  Martinique M.D.   On: 11/18/2016 10:49    Procedures Procedures (including critical care time)  Medications Ordered in ED Medications  albuterol (PROVENTIL) (2.5 MG/3ML) 0.083% nebulizer solution (not administered)  albuterol (PROVENTIL HFA;VENTOLIN HFA) 108 (90 Base) MCG/ACT inhaler 1-2 puff (not administered)  albuterol (PROVENTIL) (2.5 MG/3ML) 0.083% nebulizer solution 5 mg (5 mg Nebulization Given 11/18/16 0959)  albuterol (PROVENTIL) (2.5 MG/3ML) 0.083% nebulizer solution 5 mg (5 mg Nebulization Given 11/18/16 1330)  ipratropium (ATROVENT) nebulizer solution 0.5 mg (0.5 mg Nebulization Given 11/18/16 1329)  predniSONE (DELTASONE) tablet 60 mg (60 mg Oral Given 11/18/16 1327)     Initial Impression / Assessment and Plan / ED Course  I have reviewed the triage vital signs and the nursing notes.  Pertinent labs & imaging results that were available during my care of the patient were reviewed by me and considered in my medical decision making (see chart for details).     Patient with progressively improving shortness of breath.  Afebrile, initially tachycardic with improvement while in the ED. Initially tachypneic with improvement, and SPO2  Improved from 92% on room air show 100% on room air. I believe the tachycardia may be related to his albuterol usage.  Chest x-ray shows chronic COPD but no evidence of pneumonia, CHF, or other acute cardiopulmonary abnormality.  Low suspicion of ACS or MI in the absence of chest pain.  Low suspicion of PE in the absence of  chest pain and with low risk factors.  He was given steroids and multiple nebulizers with significant improvement in his wheezing and on reevaluation he is speaking in full sentences without difficulty.  He has mild erythema to the posterior oropharynx but no evidence strep pharyngitis, PTA, or soft tissue infection.  He will follow-up with his primary care physician for reevaluation of his stools of his nebulizers.  Will discharge with prednisone pulse and albuterol inhaler.  Discussed indications for return to the ED.Pt verbalized understanding of and agreement with plan and is safe for discharge home at  this time.  He has no complaints prior to discharge.  Final Clinical Impressions(s) / ED Diagnoses   Final diagnoses:  SOB (shortness of breath)  Sore throat    ED Discharge Orders        Ordered    predniSONE (DELTASONE) 10 MG tablet  Daily with breakfast     11/18/16 1416      Renita Papa, PA-C 11/18/16 [place], [place], DO 11/18/16 1425

## 2016-11-18 NOTE — Discharge Instructions (Signed)
Start taking prednisone as prescribed beginning tomorrow, you received the first dose in the emergency department today.  Use your albuterol inhaler every 4-6 hours as needed for shortness of breath.  Follow-up with your primary care physician in the next 3-4 days for reevaluation of your symptoms and refills of your nebulizers.  Return to the ED immediately for any concerning signs or symptoms develop such as persisting shortness of breath, chest pain, fevers, or if you are using your albuterol inhaler more than prescribed.

## 2016-11-18 NOTE — ED Notes (Addendum)
Patient now in room. Refuses to undress/put on gown. Patient uncooperative and argumentative.

## 2016-11-23 ENCOUNTER — Encounter (HOSPITAL_COMMUNITY): Payer: Self-pay | Admitting: Emergency Medicine

## 2016-11-23 ENCOUNTER — Inpatient Hospital Stay (HOSPITAL_COMMUNITY)
Admission: EM | Admit: 2016-11-23 | Discharge: 2016-11-26 | DRG: 192 | Disposition: A | Discharge status: Home / Self Care | Payer: Self-pay | Attending: Internal Medicine | Admitting: Internal Medicine

## 2016-11-23 ENCOUNTER — Emergency Department (HOSPITAL_COMMUNITY): Payer: Self-pay

## 2016-11-23 ENCOUNTER — Other Ambulatory Visit: Payer: Self-pay

## 2016-11-23 DIAGNOSIS — I10 Essential (primary) hypertension: Secondary | ICD-10-CM | POA: Diagnosis present

## 2016-11-23 DIAGNOSIS — Z880 Allergy status to penicillin: Secondary | ICD-10-CM

## 2016-11-23 DIAGNOSIS — R062 Wheezing: Secondary | ICD-10-CM

## 2016-11-23 DIAGNOSIS — F17210 Nicotine dependence, cigarettes, uncomplicated: Secondary | ICD-10-CM | POA: Diagnosis present

## 2016-11-23 DIAGNOSIS — J441 Chronic obstructive pulmonary disease with (acute) exacerbation: Principal | ICD-10-CM | POA: Diagnosis present

## 2016-11-23 DIAGNOSIS — R0602 Shortness of breath: Secondary | ICD-10-CM

## 2016-11-23 DIAGNOSIS — Z8249 Family history of ischemic heart disease and other diseases of the circulatory system: Secondary | ICD-10-CM

## 2016-11-23 DIAGNOSIS — J449 Chronic obstructive pulmonary disease, unspecified: Secondary | ICD-10-CM | POA: Diagnosis present

## 2016-11-23 DIAGNOSIS — Z7951 Long term (current) use of inhaled steroids: Secondary | ICD-10-CM

## 2016-11-23 DIAGNOSIS — Z79899 Other long term (current) drug therapy: Secondary | ICD-10-CM

## 2016-11-23 DIAGNOSIS — B9789 Other viral agents as the cause of diseases classified elsewhere: Secondary | ICD-10-CM | POA: Diagnosis present

## 2016-11-23 DIAGNOSIS — Z885 Allergy status to narcotic agent status: Secondary | ICD-10-CM

## 2016-11-23 DIAGNOSIS — Z7982 Long term (current) use of aspirin: Secondary | ICD-10-CM

## 2016-11-23 LAB — CBC WITH DIFFERENTIAL/PLATELET
BASOS ABS: 0 10*3/uL (ref 0.0–0.1)
BASOS PCT: 0 %
EOS ABS: 0.1 10*3/uL (ref 0.0–0.7)
Eosinophils Relative: 1 %
HEMATOCRIT: 42.7 % (ref 39.0–52.0)
Hemoglobin: 13.9 g/dL (ref 13.0–17.0)
Lymphocytes Relative: 24 %
Lymphs Abs: 2.3 10*3/uL (ref 0.7–4.0)
MCH: 32 pg (ref 26.0–34.0)
MCHC: 32.6 g/dL (ref 30.0–36.0)
MCV: 98.2 fL (ref 78.0–100.0)
Monocytes Absolute: 1 10*3/uL (ref 0.1–1.0)
Monocytes Relative: 11 %
NEUTROS ABS: 6.1 10*3/uL (ref 1.7–7.7)
Neutrophils Relative %: 64 %
Platelets: 199 10*3/uL (ref 150–400)
RBC: 4.35 MIL/uL (ref 4.22–5.81)
RDW: 13.9 % (ref 11.5–15.5)
WBC: 9.5 10*3/uL (ref 4.0–10.5)

## 2016-11-23 LAB — BASIC METABOLIC PANEL
ANION GAP: 7 (ref 5–15)
BUN: 14 mg/dL (ref 6–20)
CALCIUM: 8.1 mg/dL — AB (ref 8.9–10.3)
CHLORIDE: 101 mmol/L (ref 101–111)
CO2: 30 mmol/L (ref 22–32)
CREATININE: 1.06 mg/dL (ref 0.61–1.24)
Glucose, Bld: 157 mg/dL — ABNORMAL HIGH (ref 65–99)
Potassium: 3.5 mmol/L (ref 3.5–5.1)
SODIUM: 138 mmol/L (ref 135–145)

## 2016-11-23 MED ORDER — ACETAMINOPHEN 325 MG PO TABS: 650.0000 mg | ORAL_TABLET | Freq: Four times a day (QID) | ORAL | Status: DC | PRN

## 2016-11-23 MED ORDER — ACETAMINOPHEN 650 MG RE SUPP: 650.0000 mg | Freq: Four times a day (QID) | RECTAL | Status: DC | PRN

## 2016-11-23 MED ORDER — IPRATROPIUM-ALBUTEROL 0.5-2.5 (3) MG/3ML IN SOLN
3.0000 mL | Freq: Once | RESPIRATORY_TRACT | Status: AC
  Administered 2016-11-23: 3 mL via RESPIRATORY_TRACT
  Filled 2016-11-23: qty 3

## 2016-11-23 MED ORDER — AMLODIPINE BESYLATE 5 MG PO TABS
5.0000 mg | ORAL_TABLET | Freq: Every day | ORAL | Status: DC
  Filled 2016-11-23 (×2): qty 1

## 2016-11-23 MED ORDER — HYDROCODONE-ACETAMINOPHEN 5-325 MG PO TABS: 1.0000 | ORAL_TABLET | Freq: Four times a day (QID) | ORAL | Status: DC | PRN

## 2016-11-23 MED ORDER — ALBUTEROL SULFATE (2.5 MG/3ML) 0.083% IN NEBU
2.5000 mg | INHALATION_SOLUTION | RESPIRATORY_TRACT | Status: DC | PRN
  Administered 2016-11-24 – 2016-11-25 (×2): 2.5 mg via RESPIRATORY_TRACT
  Filled 2016-11-23 (×2): qty 3

## 2016-11-23 MED ORDER — ALBUTEROL SULFATE (2.5 MG/3ML) 0.083% IN NEBU
5.0000 mg | INHALATION_SOLUTION | Freq: Once | RESPIRATORY_TRACT | Status: AC
  Administered 2016-11-23: 5 mg via RESPIRATORY_TRACT

## 2016-11-23 MED ORDER — INSULIN ASPART 100 UNIT/ML ~~LOC~~ SOLN: 0.0000 [IU] | Freq: Every day | SUBCUTANEOUS | Status: DC

## 2016-11-23 MED ORDER — ONDANSETRON HCL 4 MG/2ML IJ SOLN: 4.0000 mg | Freq: Four times a day (QID) | INTRAMUSCULAR | Status: DC | PRN

## 2016-11-23 MED ORDER — METHYLPREDNISOLONE SODIUM SUCC 40 MG IJ SOLR
40.0000 mg | Freq: Three times a day (TID) | INTRAMUSCULAR | Status: DC
  Administered 2016-11-23 – 2016-11-25 (×5): 40 mg via INTRAVENOUS
  Filled 2016-11-23 (×5): qty 1

## 2016-11-23 MED ORDER — INSULIN ASPART 100 UNIT/ML ~~LOC~~ SOLN
0.0000 [IU] | Freq: Three times a day (TID) | SUBCUTANEOUS | Status: DC
  Administered 2016-11-24 (×2): 1 [IU] via SUBCUTANEOUS

## 2016-11-23 MED ORDER — ALBUTEROL (5 MG/ML) CONTINUOUS INHALATION SOLN
10.0000 mg/h | INHALATION_SOLUTION | RESPIRATORY_TRACT | Status: DC
  Administered 2016-11-23: 10 mg/h via RESPIRATORY_TRACT
  Filled 2016-11-23 (×2): qty 0.5

## 2016-11-23 MED ORDER — BUDESONIDE 0.25 MG/2ML IN SUSP
0.2500 mg | Freq: Two times a day (BID) | RESPIRATORY_TRACT | Status: DC
  Administered 2016-11-24 – 2016-11-26 (×5): 0.25 mg via RESPIRATORY_TRACT
  Filled 2016-11-23 (×6): qty 2

## 2016-11-23 MED ORDER — PRAVASTATIN SODIUM 20 MG PO TABS
10.0000 mg | ORAL_TABLET | Freq: Every day | ORAL | Status: DC
  Filled 2016-11-23 (×2): qty 1

## 2016-11-23 MED ORDER — BUPROPION HCL ER (SR) 150 MG PO TB12
150.0000 mg | ORAL_TABLET | Freq: Two times a day (BID) | ORAL | Status: DC
  Filled 2016-11-23 (×6): qty 1

## 2016-11-23 MED ORDER — ENOXAPARIN SODIUM 40 MG/0.4ML ~~LOC~~ SOLN
40.0000 mg | Freq: Every day | SUBCUTANEOUS | Status: DC
  Filled 2016-11-23: qty 0.4

## 2016-11-23 MED ORDER — PREDNISONE 20 MG PO TABS
60.0000 mg | ORAL_TABLET | Freq: Once | ORAL | Status: AC
  Administered 2016-11-23: 60 mg via ORAL
  Filled 2016-11-23: qty 3

## 2016-11-23 MED ORDER — MAGNESIUM SULFATE 2 GM/50ML IV SOLN
2.0000 g | Freq: Once | INTRAVENOUS | Status: DC
  Filled 2016-11-23: qty 50

## 2016-11-23 MED ORDER — ALBUTEROL SULFATE (2.5 MG/3ML) 0.083% IN NEBU
INHALATION_SOLUTION | RESPIRATORY_TRACT | Status: AC
  Filled 2016-11-23: qty 3

## 2016-11-23 MED ORDER — SENNOSIDES-DOCUSATE SODIUM 8.6-50 MG PO TABS
1.0000 | ORAL_TABLET | Freq: Two times a day (BID) | ORAL | Status: DC
  Administered 2016-11-24 – 2016-11-26 (×5): 1 via ORAL
  Filled 2016-11-23 (×5): qty 1

## 2016-11-23 MED ORDER — ONDANSETRON HCL 4 MG PO TABS: 4.0000 mg | ORAL_TABLET | Freq: Four times a day (QID) | ORAL | Status: DC | PRN

## 2016-11-23 MED ORDER — DEXTROSE 5 % IV SOLN
500.0000 mg | Freq: Every day | INTRAVENOUS | Status: DC
  Administered 2016-11-24 – 2016-11-25 (×3): 500 mg via INTRAVENOUS
  Filled 2016-11-23 (×4): qty 500

## 2016-11-23 MED ORDER — ASPIRIN EC 81 MG PO TBEC
81.0000 mg | DELAYED_RELEASE_TABLET | Freq: Every day | ORAL | Status: DC
  Filled 2016-11-23 (×2): qty 1

## 2016-11-23 NOTE — ED Notes (Signed)
Pt and family made aware of bed assignment 

## 2016-11-23 NOTE — ED Provider Notes (Signed)
  Recent COPD exacerbation Finished steroids yesterday from recent COPD exac. Overnight, SOB Albuterol treatment x 1, duonebs x 2, starting CAT now after minimal improvement with single nebs Oral steroids given.  9:15 - patient currently on CAT. Labs normal. Will continue to observe.  11:00 - CAT completed. He does not feel but just a little better from a breathing standpoint. Air movement is limited, continues to wheeze. O2 sat at rest on RA 90%, 87% when ambulatory.   Discussed with Dr. Myna Hidalgo who accepts the patient for admission.    Charlann Lange, PA-C 11/23/16 2321    Valarie Merino, MD 11/23/16 [phone]

## 2016-11-23 NOTE — ED Notes (Signed)
Attempted ivx2 unsuccessful 

## 2016-11-23 NOTE — ED Notes (Signed)
Asked pt to change into gown pt refused

## 2016-11-23 NOTE — ED Triage Notes (Addendum)
Pt seen here recently for asthma/COPD exacerbatio on the 5th, discharged home. XRAY showed changes from asthma COPD/ NO CHF. Pt was DC'd with prescription for a steroid, took last pill yesterday. Felt better. Then last night began feeling short of breath again. Was given an inhaler. He thinks he used it 100 times. Pt on breathing treatment, says it is helping. MD Vanita Panda informed of pt, breathing treatment administered. Will get pt to room when available.

## 2016-11-23 NOTE — H&P (Signed)
History and Physical    [name]:[id number] DOB: 08/06/1955 DOA: 11/23/2016  PCP: Alfonse Spruce, FNP   Patient coming from: Home  Chief Complaint: SOB, cough   HPI: [name] is a 61 y.o. male with medical history significant for COPD and hypertension, now presenting to the emergency department for evaluation of productive cough and dyspnea.  Patient was seen in the ED for the same complaints 5 days ago and discharged home with a prednisone taper and inhaled corticosteroid.  He reports using the inhaler, in addition to his albuterol MDI, completing the prednisone taper, initially feeling much better with these measures, but worsening again over the past 1-2 days.  He reports subjective fevers, but has not taken his temperature.  Cough is productive of thick colored sputum.  Denies chest pain or palpitations.  ED Course: Upon arrival to the ED, patient is found to be afebrile, saturating low 90s on room air, tachypneic in the 30s, slightly tachycardic, and with stable blood pressure.  EKG features normal sinus rhythm and chest x-ray is consistent with chronic COPD changes.  Chemistry panel is unremarkable and CBC is within normal limits.  Patient was treated with 60 mg prednisone, DuoNeb x2, and 10 mg continuous albuterol treatment in the ED.  Improvement with this, but continues to be dyspneic at rest and desaturates with ambulation.  He will be admitted to the medical-surgical unit for ongoing evaluation and management of COPD exacerbation, possibly precipitated by infection.  Review of Systems:  All other systems reviewed and apart from HPI, are negative.  Past Medical History:  Diagnosis Date  . Asthma   . COPD (chronic obstructive pulmonary disease) ([place])   . Hypertension     Past Surgical History:  Procedure Laterality Date  . HAND SURGERY       reports that he has been smoking cigarettes.  He has a 39.00 pack-year smoking history. he has never used  smokeless tobacco. He reports that he drinks alcohol. He reports that he does not use drugs.  Allergies  Allergen Reactions  . Codeine Itching  . Penicillins Itching and Other (See Comments)    Has patient had a PCN reaction causing immediate rash, facial/tongue/throat swelling, SOB or lightheadedness with hypotension: No Has patient had a PCN reaction causing severe rash involving mucus membranes or skin necrosis: No Has patient had a PCN reaction that required hospitalization No Has patient had a PCN reaction occurring within the last 10 years: No If all of the above answers are "NO", then may proceed with Cephalosporin use.    Family History  Problem Relation Age of Onset  . Hypertension Paternal Uncle   . Cancer Maternal Grandmother   . Hypertension Maternal Grandfather      Prior to Admission medications   Medication Sig Start Date End Date Taking? Authorizing Provider  albuterol (PROVENTIL HFA;VENTOLIN HFA) 108 (90 Base) MCG/ACT inhaler Inhale 2 puffs into the lungs every 4 (four) hours as needed for wheezing or shortness of breath. 07/24/16   Alfonse Spruce, FNP  amLODipine (NORVASC) 5 MG tablet Take 1 tablet (5 mg total) by mouth daily. 07/24/16   Alfonse Spruce, FNP  aspirin EC 81 MG tablet Take 1 tablet (81 mg total) by mouth daily. 07/24/16   Alfonse Spruce, FNP  buPROPion (WELLBUTRIN SR) 150 MG 12 hr tablet Take 1 tablet (150 mg total) by mouth 2 (two) times daily. 07/24/16   Alfonse Spruce, FNP  fluticasone (FLOVENT HFA) 44  MCG/ACT inhaler Inhale 1 puff into the lungs 2 (two) times daily. 07/24/16   Alfonse Spruce, FNP  hydrochlorothiazide (HYDRODIURIL) 25 MG tablet Take 1 tablet (25 mg total) by mouth daily. Patient not taking: Reported on 11/18/2016 07/24/16   Alfonse Spruce, FNP  HYDROcodone-acetaminophen (NORCO/VICODIN) 5-325 MG tablet Take 1 tablet by mouth every 6 (six) hours as needed for moderate pain or severe pain. 11/06/16   Vanessa Kick, MD  metFORMIN (GLUCOPHAGE) 500 MG tablet Take 1 tablet (500 mg total) by mouth daily with breakfast. 07/24/16   Alfonse Spruce, FNP  pravastatin (PRAVACHOL) 10 MG tablet Take 1 tablet (10 mg total) by mouth daily. 07/24/16   Alfonse Spruce, FNP  senna-docusate (SENOKOT-S) 8.6-50 MG tablet Take 1 tablet by mouth 2 (two) times daily. 03/12/16   Maren Reamer, MD    Physical Exam: Vitals:   11/23/16 2001 11/23/16 2045 11/23/16 2230 11/23/16 2245  BP:  131/75 135/80 133/76  Pulse:  (!) 106 100 (!) 108  Resp:  (!) 26 (!) 32 20  Temp:      SpO2: 95% 95% 94% 92%      Constitutional: NAD, calm, in apparent discomfort Eyes: PERTLA, lids and conjunctivae normal ENMT: Mucous membranes are moist. Posterior pharynx clear of any exudate or lesions.   Neck: normal, supple, no masses, no thyromegaly Respiratory: Diminished bilaterally with diffuse wheezes and frequent coughing. NO pallor. Tachypnea. No accessory muscle use.  Cardiovascular: Rate ~110 and regular. No significant JVD. Abdomen: No distension, no tenderness, no masses palpated. Bowel sounds normal.  Musculoskeletal: no clubbing / cyanosis. No joint deformity upper and lower extremities.   Skin: no significant rashes, lesions, ulcers. Warm, dry, well-perfused. Neurologic: CN 2-12 grossly intact. Sensation intact. Strength 5/5 in all 4 limbs.  Psychiatric: Alert and oriented x 3. Calm, cooperative.     Labs on Admission: I have personally reviewed following labs and imaging studies  CBC: Recent Labs  Lab 11/18/16 0959 11/23/16 2025  WBC 6.0 9.5  NEUTROABS 3.3 6.1  HGB 15.6 13.9  HCT 45.5 42.7  MCV 94.6 98.2  PLT 157 782   Basic Metabolic Panel: Recent Labs  Lab 11/18/16 0959 11/23/16 2025  NA 137 138  K 3.9 3.5  CL 103 101  CO2 26 30  GLUCOSE 109* 157*  BUN 9 14  CREATININE 0.91 1.06  CALCIUM 8.8* 8.1*   GFR: CrCl cannot be calculated (Unknown ideal weight.). Liver Function Tests: Recent  Labs  Lab 11/18/16 0959  AST 18  ALT 19  ALKPHOS 94  BILITOT 0.7  PROT 7.0  ALBUMIN 3.9   No results for input(s): LIPASE, AMYLASE in the last 168 hours. No results for input(s): AMMONIA in the last 168 hours. Coagulation Profile: No results for input(s): INR, PROTIME in the last 168 hours. Cardiac Enzymes: No results for input(s): CKTOTAL, CKMB, CKMBINDEX, TROPONINI in the last 168 hours. BNP (last 3 results) No results for input(s): PROBNP in the last 8760 hours. HbA1C: No results for input(s): HGBA1C in the last 72 hours. CBG: No results for input(s): GLUCAP in the last 168 hours. Lipid Profile: No results for input(s): CHOL, HDL, LDLCALC, TRIG, CHOLHDL, LDLDIRECT in the last 72 hours. Thyroid Function Tests: No results for input(s): TSH, T4TOTAL, FREET4, T3FREE, THYROIDAB in the last 72 hours. Anemia Panel: No results for input(s): VITAMINB12, FOLATE, FERRITIN, TIBC, IRON, RETICCTPCT in the last 72 hours. Urine analysis:    Component Value Date/Time   COLORURINE YELLOW 05/28/2014  1848   APPEARANCEUR CLOUDY (A) 05/28/2014 1848   LABSPEC 1.011 05/28/2014 1848   PHURINE 6.0 05/28/2014 1848   GLUCOSEU NEGATIVE 05/28/2014 1848   HGBUR MODERATE (A) 05/28/2014 1848   BILIRUBINUR NEGATIVE 05/28/2014 1848   KETONESUR NEGATIVE 05/28/2014 1848   PROTEINUR NEGATIVE 05/28/2014 1848   UROBILINOGEN 1.0 05/28/2014 1848   NITRITE NEGATIVE 05/28/2014 1848   LEUKOCYTESUR NEGATIVE 05/28/2014 1848   Sepsis Labs: @LABRCNTIP (procalcitonin:4,lacticidven:4) )No results found for this or any previous visit (from the past 240 hour(s)).   Radiological Exams on Admission: Dg Chest 2 View  Result Date: 11/23/2016 CLINICAL DATA:  Asthma exacerbation. EXAM: CHEST  2 VIEW COMPARISON:  11/18/2016 FINDINGS: Cardiomediastinal silhouette is normal. Mediastinal contours appear intact. There is no evidence of focal airspace consolidation, pleural effusion or pneumothorax. Chronic changes of COPD and  mild bronchiectasis. Osseous structures are without acute abnormality. Soft tissues are grossly normal. IMPRESSION: Chronic changes of COPD and mild bronchiectasis. Electronically Signed   By: Fidela Salisbury M.D.   On: 11/23/2016 18:41    EKG: Independently reviewed. Normal sinus rhythm.   Assessment/Plan  1. COPD with acute exacerbation  - Pt presents with dyspnea at rest and cough, worsening over the past 24 hrs just after completing a prednisone taper  - CXR findings consistent with COPD, but no acute infiltrate, and no leukocytosis, though patient reporting subjective fevers and chills  - He was treated in ED with multiple DuoNebs, 10 mg continuous albuterol neb, and steroids, but remains symptomatic at rest and desaturates with ambulation  - Plan to continue scheduled Flovent, consider added LABA, schedule DuoNeb, prn albuterol nebs, check sputum culture and respiratory virus panel, start azithromycin, continue systemic steroid with Solu-Medrol, and continue supportive care with supplemental O2    2. Hypertension  - BP at goal  - Continue Norvasc     DVT prophylaxis: Lovenox Code Status: Full  Family Communication: Discussed with patient Disposition Plan: Admit to med-surg Consults called: None Admission status: Inpatient    Vianne Bulls, MD [facility] Pager [phone]  If 7PM-7AM, please contact night-coverage [email or web address] Password [email or web address]  11/23/2016, 11:25 PM

## 2016-11-23 NOTE — ED Notes (Signed)
Ambulated pt on room air.  SpO2 initial 90%, remained 88-90% on room air.

## 2016-11-23 NOTE — ED Provider Notes (Signed)
[facility] EMERGENCY DEPARTMENT Provider Note   CSN: [id number] Arrival date & time: 11/23/16  1730     History   Chief Complaint Chief Complaint  Patient presents with  . Asthma  . COPD    HPI Cord Wilczynski is a 61 y.o. male with history of hypertension, COPD, and with a 1 day history of shortness of breath and wheezing.  He is also had associated cough.  He just finished a course of prednisone that was given here 5 days ago for the same symptoms.  His albuterol inhaler as well as Symbicort without relief.  He denies any chest pain.  His symptoms are not worse with exertion.  He does note that he has some abdominal pain when he is feeling like he is gasping for air.  He denies any nausea or vomiting.  He reports his girlfriend told him he felt warm, however no documented fevers.  HPI  Past Medical History:  Diagnosis Date  . Asthma   . COPD (chronic obstructive pulmonary disease) ([place])   . Hypertension     Patient Active Problem List   Diagnosis Date Noted  . Diabetes mellitus screening 03/12/2016  . Essential hypertension 10/22/2013  . Elevated BP 07/19/2013  . Obesity 02/04/2012  . Acute respiratory failure with hypoxia ([place]) 02/04/2012  . COPD with exacerbation ([place]) 02/03/2012  . Community acquired pneumonia 02/03/2012  . Tobacco abuse 02/03/2012    Past Surgical History:  Procedure Laterality Date  . HAND SURGERY         Home Medications    Prior to Admission medications   Medication Sig Start Date End Date Taking? Authorizing Provider  albuterol (PROVENTIL HFA;VENTOLIN HFA) 108 (90 Base) MCG/ACT inhaler Inhale 2 puffs into the lungs every 4 (four) hours as needed for wheezing or shortness of breath. 07/24/16   Alfonse Spruce, FNP  amLODipine (NORVASC) 5 MG tablet Take 1 tablet (5 mg total) by mouth daily. 07/24/16   Alfonse Spruce, FNP  aspirin EC 81 MG tablet Take 1 tablet (81 mg total) by mouth daily. 07/24/16    Alfonse Spruce, FNP  buPROPion (WELLBUTRIN SR) 150 MG 12 hr tablet Take 1 tablet (150 mg total) by mouth 2 (two) times daily. 07/24/16   Alfonse Spruce, FNP  clindamycin (CLEOCIN) 300 MG capsule Take 1 capsule (300 mg total) by mouth 3 (three) times daily. Patient not taking: Reported on 11/18/2016 11/06/16   Vanessa Kick, MD  fluticasone (FLOVENT HFA) 44 MCG/ACT inhaler Inhale 1 puff into the lungs 2 (two) times daily. 07/24/16   Alfonse Spruce, FNP  hydrochlorothiazide (HYDRODIURIL) 25 MG tablet Take 1 tablet (25 mg total) by mouth daily. Patient not taking: Reported on 11/18/2016 07/24/16   Alfonse Spruce, FNP  HYDROcodone-acetaminophen (NORCO/VICODIN) 5-325 MG tablet Take 1 tablet by mouth every 6 (six) hours as needed for moderate pain or severe pain. 11/06/16   Vanessa Kick, MD  metFORMIN (GLUCOPHAGE) 500 MG tablet Take 1 tablet (500 mg total) by mouth daily with breakfast. 07/24/16   Alfonse Spruce, FNP  pravastatin (PRAVACHOL) 10 MG tablet Take 1 tablet (10 mg total) by mouth daily. 07/24/16   Alfonse Spruce, FNP  senna-docusate (SENOKOT-S) 8.6-50 MG tablet Take 1 tablet by mouth 2 (two) times daily. 03/12/16   Maren Reamer, MD    Family History Family History  Problem Relation Age of Onset  . Hypertension Paternal Uncle   . Cancer Maternal Grandmother   .  Hypertension Maternal Grandfather     Social History Social History   Tobacco Use  . Smoking status: Current Some Day Smoker    Packs/day: 1.00    Years: 39.00    Pack years: 39.00    Types: Cigarettes  . Smokeless tobacco: Never Used  Substance Use Topics  . Alcohol use: Yes    Comment: "every once in awhile"  . Drug use: No    Comment: occasinally      Allergies   Codeine and Penicillins   Review of Systems Review of Systems  Constitutional: Negative for chills and fever.  HENT: Negative for facial swelling and sore throat.   Respiratory: Positive for cough, shortness of  breath and wheezing.   Cardiovascular: Negative for chest pain.  Gastrointestinal: Negative for abdominal pain (only when really short of breath), nausea and vomiting.  Genitourinary: Negative for dysuria.  Musculoskeletal: Negative for back pain.  Skin: Negative for rash and wound.  Neurological: Negative for headaches.  Psychiatric/Behavioral: The patient is not nervous/anxious.      Physical Exam Updated Vital Signs BP (!) 157/82   Pulse (!) 109   Temp 99 F (37.2 C)   Resp (!) 26   SpO2 95%   Physical Exam  Constitutional: He appears well-developed and well-nourished. No distress.  HENT:  Head: Normocephalic and atraumatic.  Mouth/Throat: Oropharynx is clear and moist. No oropharyngeal exudate.  Eyes: Conjunctivae are normal. Pupils are equal, round, and reactive to light. Right eye exhibits no discharge. Left eye exhibits no discharge. No scleral icterus.  Neck: Normal range of motion. Neck supple. No thyromegaly present.  Cardiovascular: Normal rate, regular rhythm, normal heart sounds and intact distal pulses. Exam reveals no gallop and no friction rub.  No murmur heard. Pulmonary/Chest: Effort normal. No stridor. No respiratory distress. He has decreased breath sounds. He has wheezes (expiratory throughout bilaterally). He has no rales.  Abdominal: Soft. Bowel sounds are normal. He exhibits no distension. There is no tenderness. There is no rebound and no guarding.  Musculoskeletal: He exhibits no edema.  Lymphadenopathy:    He has no cervical adenopathy.  Neurological: He is alert. Coordination normal.  Skin: Skin is warm and dry. No rash noted. He is not diaphoretic. No pallor.  Psychiatric: He has a normal mood and affect.  Nursing note and vitals reviewed.    ED Treatments / Results  Labs (all labs ordered are listed, but only abnormal results are displayed) Labs Reviewed - No data to display  EKG  EKG Interpretation None       Radiology Dg Chest 2  View  Result Date: 11/23/2016 CLINICAL DATA:  Asthma exacerbation. EXAM: CHEST  2 VIEW COMPARISON:  11/18/2016 FINDINGS: Cardiomediastinal silhouette is normal. Mediastinal contours appear intact. There is no evidence of focal airspace consolidation, pleural effusion or pneumothorax. Chronic changes of COPD and mild bronchiectasis. Osseous structures are without acute abnormality. Soft tissues are grossly normal. IMPRESSION: Chronic changes of COPD and mild bronchiectasis. Electronically Signed   By: Fidela Salisbury M.D.   On: 11/23/2016 18:41    Procedures Procedures (including critical care time)  Medications Ordered in ED Medications  albuterol (PROVENTIL) (2.5 MG/3ML) 0.083% nebulizer solution (  Canceled Entry 11/23/16 1853)  predniSONE (DELTASONE) tablet 60 mg (not administered)  albuterol (PROVENTIL,VENTOLIN) solution continuous neb (not administered)  albuterol (PROVENTIL) (2.5 MG/3ML) 0.083% nebulizer solution 5 mg (5 mg Nebulization Given 11/23/16 1747)  ipratropium-albuterol (DUONEB) 0.5-2.5 (3) MG/3ML nebulizer solution 3 mL (3 mLs Nebulization Given 11/23/16  1842)  ipratropium-albuterol (DUONEB) 0.5-2.5 (3) MG/3ML nebulizer solution 3 mL (3 mLs Nebulization Given 11/23/16 2001)     Initial Impression / Assessment and Plan / ED Course  I have reviewed the triage vital signs and the nursing notes.  Pertinent labs & imaging results that were available during my care of the patient were reviewed by me and considered in my medical decision making (see chart for details).  Clinical Course as of Nov 24 2014  Sat Nov 23, 2016  1955 Patient evaluated after an albuterol neb and Duoneb. He states he feels a little better, but not back to baseline. He still has expiratory wheezing throughout, mild improved lung exam.  [AL]  2013 After 2nd Duoneb, patient says he feels "alright," but now back to baseline and does still have an increased work of breathing. We will try a continuous  albuterol neb and re-evaluate.  [AL]    Clinical Course User Index [AL] Frederica Kuster, PA-C    Patient with COPD exacerbation.  Chest x-ray shows chronic changes of COPD and mild bronchiectasis.  After an albuterol and 2 DuoNeb treatments, patient still somewhat short of breath and not back to baseline.  His lung exam is mildly improved, however expiratory wheezing remains.  Will progress to continuous albuterol nebulizer and recheck. At shift change, patient care transferred to Charlann Lange, PA-C for continued evaluation, follow up following continuous and determination of disposition. Anticipate discharge, if better after continutous, with more prednisone. May need admission if no improvement. Encourage patient to establish care/see with PCP.    Final Clinical Impressions(s) / ED Diagnoses   Final diagnoses:  COPD exacerbation [facility])  Shortness of breath  Wheezing    ED Discharge Orders    None       Caryl Ada 11/23/16 2028    Valarie Merino, MD 11/23/16 [phone]

## 2016-11-24 ENCOUNTER — Other Ambulatory Visit: Payer: Self-pay

## 2016-11-24 ENCOUNTER — Encounter (HOSPITAL_COMMUNITY): Payer: Self-pay | Admitting: *Deleted

## 2016-11-24 DIAGNOSIS — R062 Wheezing: Secondary | ICD-10-CM

## 2016-11-24 DIAGNOSIS — R0602 Shortness of breath: Secondary | ICD-10-CM

## 2016-11-24 LAB — RESPIRATORY PANEL BY PCR
ADENOVIRUS-RVPPCR: NOT DETECTED
Bordetella pertussis: NOT DETECTED
CORONAVIRUS 229E-RVPPCR: NOT DETECTED
CORONAVIRUS HKU1-RVPPCR: NOT DETECTED
CORONAVIRUS NL63-RVPPCR: NOT DETECTED
CORONAVIRUS OC43-RVPPCR: NOT DETECTED
Chlamydophila pneumoniae: NOT DETECTED
Influenza A: NOT DETECTED
Influenza B: NOT DETECTED
METAPNEUMOVIRUS-RVPPCR: NOT DETECTED
Mycoplasma pneumoniae: NOT DETECTED
PARAINFLUENZA VIRUS 1-RVPPCR: NOT DETECTED
PARAINFLUENZA VIRUS 2-RVPPCR: NOT DETECTED
Parainfluenza Virus 3: NOT DETECTED
Parainfluenza Virus 4: NOT DETECTED
Respiratory Syncytial Virus: NOT DETECTED
Rhinovirus / Enterovirus: DETECTED — AB

## 2016-11-24 LAB — HIV ANTIBODY (ROUTINE TESTING W REFLEX): HIV SCREEN 4TH GENERATION: NONREACTIVE

## 2016-11-24 LAB — CBC
HCT: 44.4 % (ref 39.0–52.0)
Hemoglobin: 14.5 g/dL (ref 13.0–17.0)
MCH: 32 pg (ref 26.0–34.0)
MCHC: 32.7 g/dL (ref 30.0–36.0)
MCV: 98 fL (ref 78.0–100.0)
Platelets: 226 10*3/uL (ref 150–400)
RBC: 4.53 MIL/uL (ref 4.22–5.81)
RDW: 14 % (ref 11.5–15.5)
WBC: 8.1 10*3/uL (ref 4.0–10.5)

## 2016-11-24 LAB — GLUCOSE, CAPILLARY
GLUCOSE-CAPILLARY: 118 mg/dL — AB (ref 65–99)
GLUCOSE-CAPILLARY: 120 mg/dL — AB (ref 65–99)
GLUCOSE-CAPILLARY: 136 mg/dL — AB (ref 65–99)
Glucose-Capillary: 139 mg/dL — ABNORMAL HIGH (ref 65–99)
Glucose-Capillary: 128 mg/dL — ABNORMAL HIGH (ref 65–99)

## 2016-11-24 MED ORDER — NICOTINE 21 MG/24HR TD PT24
21.0000 mg | MEDICATED_PATCH | Freq: Every day | TRANSDERMAL | Status: DC
  Administered 2016-11-24 – 2016-11-26 (×3): 21 mg via TRANSDERMAL
  Filled 2016-11-24 (×4): qty 1

## 2016-11-24 NOTE — Progress Notes (Signed)
[name] HFS:[id number] DOB: 28-Jan-1955 DOA: 11/23/2016  PCP: Alfonse Spruce, FNP   Patient coming from: Home  Chief Complaint: SOB, cough   HPI: [name] is a 61 y.o. male with medical history significant for COPD and hypertension, now presenting to the emergency department for evaluation of productive cough and dyspnea.  Patient was seen in the ED for the same complaints 5 days ago and discharged home with a prednisone taper and inhaled corticosteroid.  He reports using the inhaler, in addition to his albuterol MDI, completing the prednisone taper, initially feeling much better with these measures, but worsening again over the past 1-2 days.  He reports subjective fevers, but has not taken his temperature.  Cough is productive of thick colored sputum.  Denies chest pain or palpitations.  Patient alert and oriented x 4, denies pain at this time.  Patient refuses two nurses to assess his skin.  Patient IV was wrapped and he took a shower as soon as he arrived.  Will continue to monitor.  Wilson Singer, RN

## 2016-11-24 NOTE — Progress Notes (Signed)
Finally received a continuous pulse ox box that worked.  Patient oxygen sats were 87%-89% on room air while asleep.  Placed on 2L of oxygen by nasal cannula, will continue to monitor.  Wilson Singer, RN

## 2016-11-24 NOTE — Plan of Care (Signed)
O2 saturation on room air remains above 92%, receives scheduled and prn breathing treatments with relief

## 2016-11-24 NOTE — Progress Notes (Addendum)
PROGRESS NOTE    [name]  SWF:[id number] DOB: 1955-08-02 DOA: 11/23/2016 PCP: Alfonse Spruce, FNP    Brief Narrative:  [name] is a 61 y.o. male with medical history significant for COPD and hypertension, now presenting to the emergency department for evaluation of productive cough and dyspnea.  Patient was seen in the ED for the same complaints 5 days ago and discharged home with a prednisone taper and inhaled corticosteroid.  He reports using the inhaler, in addition to his albuterol MDI, completing the prednisone taper, initially feeling much better with these measures, but worsening again over the past 1-2 days.  He reports subjective fevers, but has not taken his temperature.  Cough is productive of thick colored sputum.  Denies chest pain or palpitations.  ED Course: Upon arrival to the ED, patient is found to be afebrile, saturating low 90s on room air, tachypneic in the 30s, slightly tachycardic, and with stable blood pressure.  EKG features normal sinus rhythm and chest x-ray is consistent with chronic COPD changes.  Chemistry panel is unremarkable and CBC is within normal limits.  Patient was treated with 60 mg prednisone, DuoNeb x2, and 10 mg continuous albuterol treatment in the ED.  Improvement with this, but continues to be dyspneic at rest and desaturates with ambulation.  He will be admitted to the medical-surgical unit for ongoing evaluation and management of COPD exacerbation, possibly precipitated by infection.     Assessment & Plan:   Principal Problem:   COPD with exacerbation ([place]) Active Problems:   Essential hypertension   COPD with acute exacerbation ([facility])   COPD with acute exacerbation  - Pt presents with dyspnea at rest and cough, worsening over the past 24 hrs just after completing a prednisone taper  - CXR findings consistent with COPD, but no acute infiltrate, and no leukocytosis, though patient reporting subjective fevers and  chills  - s/p  Treatment in ED with multiple DuoNebs, 10 mg continuous albuterol neb, and steroids, but remains symptomatic at rest and desaturates with ambulation  - Continue scheduled Flovent - schedule DuoNeb - prn albuterol nebs - check sputum culture and respiratory virus panel - start azithromycin - continue systemic steroid with Solu-Medrol - continue supportive care with supplemental O2   -Noted to be rhinovirus positive   Hypertension  - BP at goal  - Continue Norvasc    Tobacco abuse -Will order 21 mcg nicotine patch -We will order case management consult to assist patient in finding affordable nicotine replacement therapies   DVT prophylaxis: Lovenox Code Status: Full  Family Communication: Discussed with patient Disposition Plan: Admit to med-surg    Consultants:   None  Procedures:   None  Antimicrobials:   Azithromycin   Subjective: Patient reports that he feels increasingly breathless with even ambulation just to the restroom he is sitting in his chair tripoding breathing heavily.  He is not wearing his supplemental oxygen at time of exam.  He discusses his desire to quit smoking.  He is upset that he was given insulin for elevated blood sugars.  Objective: Vitals:   11/24/16 0041 11/24/16 0512 11/24/16 0830 11/24/16 1355  BP: 136/87 (!) 160/77  (!) 157/80  Pulse: 99 98  98  Resp: 20 20    Temp: 98.4 F (36.9 C) 98.3 F (36.8 C)  98.1 F (36.7 C)  TempSrc: Oral Oral  Oral  SpO2: 91% 92% 95% 93%  Weight: 105 kg (231 lb 6.4 oz)  Height: 5\' 10"  (1.778 m)       Intake/Output Summary (Last 24 hours) at 11/24/2016 1501 Last data filed at 11/24/2016 1357 Gross per 24 hour  Intake 970 ml  Output -  Net 970 ml   Filed Weights   11/24/16 0041  Weight: 105 kg (231 lb 6.4 oz)    Examination:  General exam: Moderate distress Respiratory system: poor air exchange, minimal air movement, inspiratory and expiratory wheezing noted  bilaterally in all lung fields Cardiovascular system: S1 & S2 heard, RRR. No JVD, murmurs, rubs, gallops or clicks. No pedal edema. Gastrointestinal system: Abdomen is nondistended, soft and nontender. No organomegaly or masses felt. Normal bowel sounds heard. Central nervous system: Alert and oriented. No focal neurological deficits. Extremities: Symmetric 5 x 5 power. Skin: No rashes, lesions or ulcers Psychiatry: Judgement and insight appear normal. Mood & affect appropriate.     Data Reviewed: I have personally reviewed following labs and imaging studies  CBC: Recent Labs  Lab 11/18/16 0959 11/23/16 2025 11/24/16 0428  WBC 6.0 9.5 8.1  NEUTROABS 3.3 6.1  --   HGB 15.6 13.9 14.5  HCT 45.5 42.7 44.4  MCV 94.6 98.2 98.0  PLT 157 199 417   Basic Metabolic Panel: Recent Labs  Lab 11/18/16 0959 11/23/16 2025  NA 137 138  K 3.9 3.5  CL 103 101  CO2 26 30  GLUCOSE 109* 157*  BUN 9 14  CREATININE 0.91 1.06  CALCIUM 8.8* 8.1*   GFR: Estimated Creatinine Clearance: 91.1 mL/min (by C-G formula based on SCr of 1.06 mg/dL). Liver Function Tests: Recent Labs  Lab 11/18/16 0959  AST 18  ALT 19  ALKPHOS 94  BILITOT 0.7  PROT 7.0  ALBUMIN 3.9   No results for input(s): LIPASE, AMYLASE in the last 168 hours. No results for input(s): AMMONIA in the last 168 hours. Coagulation Profile: No results for input(s): INR, PROTIME in the last 168 hours. Cardiac Enzymes: No results for input(s): CKTOTAL, CKMB, CKMBINDEX, TROPONINI in the last 168 hours. BNP (last 3 results) No results for input(s): PROBNP in the last 8760 hours. HbA1C: No results for input(s): HGBA1C in the last 72 hours. CBG: Recent Labs  Lab 11/24/16 0043 11/24/16 0807 11/24/16 1142  GLUCAP 118* 139* 128*   Lipid Profile: No results for input(s): CHOL, HDL, LDLCALC, TRIG, CHOLHDL, LDLDIRECT in the last 72 hours. Thyroid Function Tests: No results for input(s): TSH, T4TOTAL, FREET4, T3FREE, THYROIDAB  in the last 72 hours. Anemia Panel: No results for input(s): VITAMINB12, FOLATE, FERRITIN, TIBC, IRON, RETICCTPCT in the last 72 hours. Sepsis Labs: Recent Labs  Lab 11/18/16 1017  LATICACIDVEN 0.96    Recent Results (from the past 240 hour(s))  Respiratory Panel by PCR     Status: Abnormal   Collection Time: 11/23/16 11:40 PM  Result Value Ref Range Status   Adenovirus NOT DETECTED NOT DETECTED Final   Coronavirus 229E NOT DETECTED NOT DETECTED Final   Coronavirus HKU1 NOT DETECTED NOT DETECTED Final   Coronavirus NL63 NOT DETECTED NOT DETECTED Final   Coronavirus OC43 NOT DETECTED NOT DETECTED Final   Metapneumovirus NOT DETECTED NOT DETECTED Final   Rhinovirus / Enterovirus DETECTED (A) NOT DETECTED Final   Influenza A NOT DETECTED NOT DETECTED Final   Influenza B NOT DETECTED NOT DETECTED Final   Parainfluenza Virus 1 NOT DETECTED NOT DETECTED Final   Parainfluenza Virus 2 NOT DETECTED NOT DETECTED Final   Parainfluenza Virus 3 NOT DETECTED NOT DETECTED Final  Parainfluenza Virus 4 NOT DETECTED NOT DETECTED Final   Respiratory Syncytial Virus NOT DETECTED NOT DETECTED Final   Bordetella pertussis NOT DETECTED NOT DETECTED Final   Chlamydophila pneumoniae NOT DETECTED NOT DETECTED Final   Mycoplasma pneumoniae NOT DETECTED NOT DETECTED Final         Radiology Studies: Dg Chest 2 View  Result Date: 11/23/2016 CLINICAL DATA:  Asthma exacerbation. EXAM: CHEST  2 VIEW COMPARISON:  11/18/2016 FINDINGS: Cardiomediastinal silhouette is normal. Mediastinal contours appear intact. There is no evidence of focal airspace consolidation, pleural effusion or pneumothorax. Chronic changes of COPD and mild bronchiectasis. Osseous structures are without acute abnormality. Soft tissues are grossly normal. IMPRESSION: Chronic changes of COPD and mild bronchiectasis. Electronically Signed   By: Fidela Salisbury M.D.   On: 11/23/2016 18:41        Scheduled Meds: . amLODipine  5 mg  Oral Daily  . aspirin EC  81 mg Oral Daily  . budesonide  0.25 mg Inhalation BID  . buPROPion  150 mg Oral BID WC  . enoxaparin (LOVENOX) injection  40 mg Subcutaneous Daily  . insulin aspart  0-5 Units Subcutaneous QHS  . insulin aspart  0-9 Units Subcutaneous TID WC  . methylPREDNISolone (SOLU-MEDROL) injection  40 mg Intravenous Q8H  . pravastatin  10 mg Oral q1800  . senna-docusate  1 tablet Oral BID   Continuous Infusions: . albuterol Stopped (11/23/16 2200)  . azithromycin Stopped (11/24/16 0302)     LOS: 1 day    Time spent: 35 minutes    Loretha Stapler, MD [facility] Pager [phone]  If 7PM-7AM, please contact night-coverage [email or web address] Password [email or web address] 11/24/2016, 3:01 PM

## 2016-11-24 NOTE — Progress Notes (Signed)
I have requested 2 continuous pulse ox boxes from [employer] and neither one have worked.  Will continue to monitor oxygen saturation with vitals until a box that is operable is delivered.  Wilson Singer, RN

## 2016-11-25 LAB — GLUCOSE, CAPILLARY
GLUCOSE-CAPILLARY: 103 mg/dL — AB (ref 65–99)
GLUCOSE-CAPILLARY: 79 mg/dL (ref 65–99)
GLUCOSE-CAPILLARY: 116 mg/dL — AB (ref 65–99)
GLUCOSE-CAPILLARY: 196 mg/dL — AB (ref 65–99)

## 2016-11-25 MED ORDER — IPRATROPIUM-ALBUTEROL 0.5-2.5 (3) MG/3ML IN SOLN
3.0000 mL | Freq: Four times a day (QID) | RESPIRATORY_TRACT | Status: DC
  Administered 2016-11-25 (×2): 3 mL via RESPIRATORY_TRACT
  Filled 2016-11-25 (×2): qty 3

## 2016-11-25 MED ORDER — METHYLPREDNISOLONE SODIUM SUCC 125 MG IJ SOLR
60.0000 mg | Freq: Three times a day (TID) | INTRAMUSCULAR | Status: DC
  Administered 2016-11-25 – 2016-11-26 (×4): 60 mg via INTRAVENOUS
  Filled 2016-11-25 (×4): qty 2

## 2016-11-25 MED ORDER — ENOXAPARIN SODIUM 60 MG/0.6ML ~~LOC~~ SOLN: 50.0000 mg | Freq: Every day | SUBCUTANEOUS | Status: DC

## 2016-11-25 NOTE — Progress Notes (Signed)
PROGRESS NOTE    [name]  NWG:[id number] DOB: [birth year] DOA: 11/23/2016 PCP: Alfonse Spruce, FNP    Brief Narrative:  [name] is a 61 y.o. male with medical history significant for COPD and hypertension, now presenting to the emergency department for evaluation of productive cough and dyspnea.  Patient was seen in the ED for the same complaints 5 days ago and discharged home with a prednisone taper and inhaled corticosteroid.  He reports using the inhaler, in addition to his albuterol MDI, completing the prednisone taper, initially feeling much better with these measures, but worsening again over the past 1-2 days.  He reports subjective fevers, but has not taken his temperature.  Cough is productive of thick colored sputum.  Denies chest pain or palpitations.  ED Course: Upon arrival to the ED, patient is found to be afebrile, saturating low 90s on room air, tachypneic in the 30s, slightly tachycardic, and with stable blood pressure.  EKG features normal sinus rhythm and chest x-ray is consistent with chronic COPD changes.  Chemistry panel is unremarkable and CBC is within normal limits.  Patient was treated with 60 mg prednisone, DuoNeb x2, and 10 mg continuous albuterol treatment in the ED.  Improvement with this, but continues to be dyspneic at rest and desaturates with ambulation.  He will be admitted to the medical-surgical unit for ongoing evaluation and management of COPD exacerbation, possibly precipitated by infection.     Assessment & Plan:   Principal Problem:   COPD with exacerbation ([place]) Active Problems:   Essential hypertension   COPD with acute exacerbation (HCC)   COPD with acute exacerbation  - Pt presents with dyspnea at rest and cough, worsening over the past 24 hrs just after completing a prednisone taper  - CXR findings consistent with COPD, but no acute infiltrate.  - s/p  Treatment in ED with multiple DuoNebs, 10 mg continuous  albuterol neb, and steroids, but remains symptomatic at rest and desaturates with ambulation  - schedule DuoNeb, Albuterol, add ipratropium  - prn albuterol nebs -respiratory panel positive for rhinovirus. Support care.  -Continue with azithromycin - continue systemic steroid with Solu-Medrol, increase dose.    Hypertension  - Continue Norvasc    Tobacco abuse -Continue with 21 mcg nicotine patch -We will order case management consult to assist patient in finding affordable nicotine replacement therapies   DVT prophylaxis: Lovenox Code Status: Full  Family Communication: Discussed with patient Disposition Plan:  home when respiratory status stable.     Consultants:   None  Procedures:   None  Antimicrobials:   Azithromycin   Subjective: Sitting in bed tripod position. Report SOB. Feels a little better than yesterday. Report productive cough. Report BM and eating ok.   Objective: Vitals:   11/24/16 2215 11/25/16 0521 11/25/16 0810 11/25/16 0812  BP: (!) 146/87 (!) 167/103    Pulse: (!) 109 96    Resp: 18 18    Temp: 98.3 F (36.8 C) 98.2 F (36.8 C)    TempSrc: Oral Oral    SpO2: (!) 64% 96% 95% 95%  Weight:      Height:        Intake/Output Summary (Last 24 hours) at 11/25/2016 0818 Last data filed at 11/25/2016 0604 Gross per 24 hour  Intake 970 ml  Output -  Net 970 ml   Filed Weights   11/24/16 0041  Weight: 105 kg (231 lb 6.4 oz)    Examination:  General exam: Mild distress.  Respiratory system: Bilateral expiratory wheezing.  Cardiovascular system: S 1, S 2 RRR Gastrointestinal system: BS present, soft, nt Central nervous system: Non focal.  Extremities: Symmetric power.  Skin: No rashes, lesions or ulcers   Data Reviewed: I have personally reviewed following labs and imaging studies  CBC: Recent Labs  Lab 11/18/16 0959 11/23/16 2025 11/24/16 0428  WBC 6.0 9.5 8.1  NEUTROABS 3.3 6.1  --   HGB 15.6 13.9 14.5  HCT 45.5 42.7  44.4  MCV 94.6 98.2 98.0  PLT 157 199 902   Basic Metabolic Panel: Recent Labs  Lab 11/18/16 0959 11/23/16 2025  NA 137 138  K 3.9 3.5  CL 103 101  CO2 26 30  GLUCOSE 109* 157*  BUN 9 14  CREATININE 0.91 1.06  CALCIUM 8.8* 8.1*   GFR: Estimated Creatinine Clearance: 91.1 mL/min (by C-G formula based on SCr of 1.06 mg/dL). Liver Function Tests: Recent Labs  Lab 11/18/16 0959  AST 18  ALT 19  ALKPHOS 94  BILITOT 0.7  PROT 7.0  ALBUMIN 3.9   No results for input(s): LIPASE, AMYLASE in the last 168 hours. No results for input(s): AMMONIA in the last 168 hours. Coagulation Profile: No results for input(s): INR, PROTIME in the last 168 hours. Cardiac Enzymes: No results for input(s): CKTOTAL, CKMB, CKMBINDEX, TROPONINI in the last 168 hours. BNP (last 3 results) No results for input(s): PROBNP in the last 8760 hours. HbA1C: No results for input(s): HGBA1C in the last 72 hours. CBG: Recent Labs  Lab 11/24/16 0807 11/24/16 1142 11/24/16 1646 11/24/16 2124 11/25/16 0748  GLUCAP 139* 128* 120* 136* 103*   Lipid Profile: No results for input(s): CHOL, HDL, LDLCALC, TRIG, CHOLHDL, LDLDIRECT in the last 72 hours. Thyroid Function Tests: No results for input(s): TSH, T4TOTAL, FREET4, T3FREE, THYROIDAB in the last 72 hours. Anemia Panel: No results for input(s): VITAMINB12, FOLATE, FERRITIN, TIBC, IRON, RETICCTPCT in the last 72 hours. Sepsis Labs: Recent Labs  Lab 11/18/16 1017  LATICACIDVEN 0.96    Recent Results (from the past 240 hour(s))  Respiratory Panel by PCR     Status: Abnormal   Collection Time: 11/23/16 11:40 PM  Result Value Ref Range Status   Adenovirus NOT DETECTED NOT DETECTED Final   Coronavirus 229E NOT DETECTED NOT DETECTED Final   Coronavirus HKU1 NOT DETECTED NOT DETECTED Final   Coronavirus NL63 NOT DETECTED NOT DETECTED Final   Coronavirus OC43 NOT DETECTED NOT DETECTED Final   Metapneumovirus NOT DETECTED NOT DETECTED Final    Rhinovirus / Enterovirus DETECTED (A) NOT DETECTED Final   Influenza A NOT DETECTED NOT DETECTED Final   Influenza B NOT DETECTED NOT DETECTED Final   Parainfluenza Virus 1 NOT DETECTED NOT DETECTED Final   Parainfluenza Virus 2 NOT DETECTED NOT DETECTED Final   Parainfluenza Virus 3 NOT DETECTED NOT DETECTED Final   Parainfluenza Virus 4 NOT DETECTED NOT DETECTED Final   Respiratory Syncytial Virus NOT DETECTED NOT DETECTED Final   Bordetella pertussis NOT DETECTED NOT DETECTED Final   Chlamydophila pneumoniae NOT DETECTED NOT DETECTED Final   Mycoplasma pneumoniae NOT DETECTED NOT DETECTED Final         Radiology Studies: Dg Chest 2 View  Result Date: 11/23/2016 CLINICAL DATA:  Asthma exacerbation. EXAM: CHEST  2 VIEW COMPARISON:  11/18/2016 FINDINGS: Cardiomediastinal silhouette is normal. Mediastinal contours appear intact. There is no evidence of focal airspace consolidation, pleural effusion or pneumothorax. Chronic changes of COPD and mild bronchiectasis. Osseous structures are without acute  abnormality. Soft tissues are grossly normal. IMPRESSION: Chronic changes of COPD and mild bronchiectasis. Electronically Signed   By: Fidela Salisbury M.D.   On: 11/23/2016 18:41        Scheduled Meds: . amLODipine  5 mg Oral Daily  . aspirin EC  81 mg Oral Daily  . budesonide  0.25 mg Inhalation BID  . buPROPion  150 mg Oral BID WC  . enoxaparin (LOVENOX) injection  40 mg Subcutaneous Daily  . insulin aspart  0-5 Units Subcutaneous QHS  . insulin aspart  0-9 Units Subcutaneous TID WC  . ipratropium-albuterol  3 mL Nebulization Q6H  . methylPREDNISolone (SOLU-MEDROL) injection  60 mg Intravenous Q8H  . nicotine  21 mg Transdermal Daily  . pravastatin  10 mg Oral q1800  . senna-docusate  1 tablet Oral BID   Continuous Infusions: . azithromycin Stopped (11/24/16 2252)     LOS: 2 days    Time spent: 35 minutes    Elmarie Shiley, MD [facility] Pager  [phone]  If 7PM-7AM, please contact night-coverage [email or web address] Password [email or web address] 11/25/2016, 8:18 AM

## 2016-11-26 DIAGNOSIS — J441 Chronic obstructive pulmonary disease with (acute) exacerbation: Principal | ICD-10-CM

## 2016-11-26 LAB — GLUCOSE, CAPILLARY
GLUCOSE-CAPILLARY: 116 mg/dL — AB (ref 65–99)
Glucose-Capillary: 127 mg/dL — ABNORMAL HIGH (ref 65–99)

## 2016-11-26 MED ORDER — AMLODIPINE BESYLATE 5 MG PO TABS: 5.0000 mg | ORAL_TABLET | Freq: Every day | ORAL | 0 refills | Status: DC

## 2016-11-26 MED ORDER — IPRATROPIUM-ALBUTEROL 0.5-2.5 (3) MG/3ML IN SOLN: 3.0000 mL | Freq: Three times a day (TID) | RESPIRATORY_TRACT | 0 refills | Status: DC

## 2016-11-26 MED ORDER — BUPROPION HCL ER (SR) 150 MG PO TB12: 150.0000 mg | ORAL_TABLET | Freq: Two times a day (BID) | ORAL | 0 refills | Status: DC

## 2016-11-26 MED ORDER — AZITHROMYCIN 500 MG PO TABS: 500.0000 mg | ORAL_TABLET | Freq: Every day | ORAL | 0 refills | Status: DC

## 2016-11-26 MED ORDER — IPRATROPIUM-ALBUTEROL 0.5-2.5 (3) MG/3ML IN SOLN
3.0000 mL | Freq: Three times a day (TID) | RESPIRATORY_TRACT | Status: DC
  Administered 2016-11-26 (×2): 3 mL via RESPIRATORY_TRACT
  Filled 2016-11-26 (×2): qty 3

## 2016-11-26 MED ORDER — PREDNISONE 20 MG PO TABS: ORAL_TABLET | ORAL | 0 refills | Status: DC

## 2016-11-26 MED ORDER — PRAVASTATIN SODIUM 10 MG PO TABS: 10.0000 mg | ORAL_TABLET | Freq: Every day | ORAL | 0 refills | Status: DC

## 2016-11-26 MED ORDER — BUPROPION HCL ER (SR) 150 MG PO TB12
150.0000 mg | ORAL_TABLET | Freq: Two times a day (BID) | ORAL | 0 refills | Status: DC
Start: 2016-11-26 — End: 2016-11-26

## 2016-11-26 MED ORDER — NICOTINE 21 MG/24HR TD PT24: 21.0000 mg | MEDICATED_PATCH | Freq: Every day | TRANSDERMAL | 0 refills | Status: DC

## 2016-11-26 MED ORDER — ASPIRIN 81 MG PO TBEC: 81.0000 mg | DELAYED_RELEASE_TABLET | Freq: Every day | ORAL | 0 refills | Status: DC

## 2016-11-26 MED ORDER — IPRATROPIUM-ALBUTEROL 0.5-2.5 (3) MG/3ML IN SOLN
3.0000 mL | Freq: Three times a day (TID) | RESPIRATORY_TRACT | 0 refills | Status: DC
Start: 2016-11-26 — End: 2016-11-26

## 2016-11-26 MED FILL — AZITHROMYCIN 500 MG TABLET: 500 | 3 days supply | Qty: 3 | Fill #0

## 2016-11-26 MED FILL — IPRAT-ALBUT 0.5-3(2.5) MG/3: 0.5-2.5 (3) | 30 days supply | Qty: 270 | Fill #0

## 2016-11-26 MED FILL — predniSONE 20 MG TABS: 20 | 5 days supply | Qty: 10 | Fill #0

## 2016-11-26 MED FILL — AMLODIPINE BESYLATE 5 MG TA: 5 | 30 days supply | Qty: 30 | Fill #0

## 2016-11-26 MED FILL — PRAVASTATIN SODIUM 10 MG TA: 10 | 30 days supply | Qty: 30 | Fill #0

## 2016-11-26 MED FILL — ?BUPROPION HCL SR 150MG TAB: 150 | 15 days supply | Qty: 30 | Fill #0

## 2016-11-26 MED FILL — NICOTINE 21 MG/24HR PATCH: 21 | 28 days supply | Qty: 28 | Fill #0

## 2016-11-26 NOTE — Care Management Note (Addendum)
Case Management Note  Patient Details  Name: [name] MRN: [id number] Date of Birth: 06-08-1955  Subjective/Objective:                 Admitted with COPD exacerbation.   Action/Plan: Plan is to d/c to home today. Post hospital follow up scheduled for 12/12/2016 at 8:30am with PCP @ the [facility]. Match Letter given and explained. Pt states cant afford Rx meds. CM faxed prescriptions to [facility] pharmacy.  Pt stateshas transportation to home.  Expected Discharge Date:  11/26/16               Expected Discharge Plan:  Home/Self Care  In-House Referral:     Discharge planning Services  Follow-up appt scheduled, CM Consult, [place] Acute Care Choice:    Choice offered to:     DME Arranged:  Nebulizer machine DME Agency:  [facility]. , referral made ( charity case), nebulizer will be delivered to bedside prior to d/c pending approval, pt without insurance HH Arranged:    HH Agency:     Status of Service:  Completed, signed off  If discussed at [employer], dates discussed:    Additional Comments:  Sharin Mons, RN 11/26/2016, 11:10 AM

## 2016-11-26 NOTE — Discharge Summary (Signed)
Physician Discharge Summary  [name] KYH:[id number] DOB: [birth year] DOA: 11/23/2016  PCP: Alfonse Spruce, FNP  Admit date: 11/23/2016 Discharge date: 11/26/2016  Admitted From: Home  Disposition: Home   Recommendations for Outpatient Follow-up:  1. Follow up with PCP in 1-2 weeks 2. Please obtain BMP/CBC in one week    Discharge Condition: stable.  CODE STATUS: full code.  Diet recommendation: Heart Healthy   Brief/Interim Summary: [name] a 61 y.o.malewith medical history significant forCOPD and hypertension, now presenting to the emergency department for evaluation of productive cough and dyspnea. Patient was seen in the ED for the same complaints 5 days ago and discharged home with a prednisone taper and inhaled corticosteroid. He reports using the inhaler, in addition to his albuterol MDI, completing the prednisone taper, initially feeling much better with these measures, but worsening again over the past 1-2 days. He reports subjective fevers, but has not taken his temperature. Cough is productive of thick colored sputum. Denies chest pain or palpitations.  ED Course:Upon arrival to the ED, patient is found to be afebrile,saturating low 90s on room air, tachypneic in the 30s, slightly tachycardic, and with stable blood pressure. EKG features normal sinus rhythm and chest x-ray is consistent with chronic COPD changes. Chemistry panel is unremarkable and CBC is within normal limits. Patient was treated with 60 mg prednisone, DuoNeb x2, and 10 mg continuous albuterol treatment in the ED. Improvement with this, but continues to be dyspneic at rest and desaturates with ambulation. He will be admitted to the medical-surgical unit for ongoing evaluation and management of COPD exacerbation, possibly precipitated by infection.     Assessment & Plan:   Principal Problem:   COPD with exacerbation ([place]) Active Problems:   Essential  hypertension   COPD with acute exacerbation (HCC)   COPD with acute exacerbation -Pt presents with dyspnea at rest and cough, worsening over the past 24 hrs just after completing a prednisone taper -CXR findings consistent with COPD, but no acute infiltrate.  -s/p  Treatment in ED with multiple DuoNebs, 10 mg continuous albuterol neb, and steroids, but remains symptomatic at rest and desaturates with ambulation - schedule DuoNeb, Albuterol, add ipratropium  - prn albuterol nebs -respiratory panel positive for rhinovirus. Support care.  -Continue with azithromycin -improved, no wheezing on lung exam. He is feeling better. Plan to discharge on prednisone taper    Hypertension -Continue Norvasc  Tobacco abuse -Continue with 21 mcg nicotine patch -counseling provided.    Discharge Diagnoses:  Principal Problem:   COPD with exacerbation [facility]) Active Problems:   Essential hypertension   COPD with acute exacerbation [facility])    Discharge Instructions  Discharge Instructions    Diet - low sodium heart healthy   Complete by:  As directed    Increase activity slowly   Complete by:  As directed      Allergies as of 11/26/2016      Reactions   Codeine Itching   Penicillins Itching, Other (See Comments)   Has patient had a PCN reaction causing immediate rash, facial/tongue/throat swelling, SOB or lightheadedness with hypotension: No Has patient had a PCN reaction causing severe rash involving mucus membranes or skin necrosis: No Has patient had a PCN reaction that required hospitalization No Has patient had a PCN reaction occurring within the last 10 years: No If all of the above answers are "NO", then may proceed with Cephalosporin use.      Medication List    TAKE these medications  albuterol 108 (90 Base) MCG/ACT inhaler Commonly known as:  PROVENTIL HFA;VENTOLIN HFA Inhale 2 puffs into the lungs every 4 (four) hours as needed for wheezing or shortness of  breath.   amLODipine 5 MG tablet Commonly known as:  NORVASC Take 1 tablet (5 mg total) daily by mouth. Start taking on:  11/27/2016   aspirin 81 MG EC tablet Take 1 tablet (81 mg total) daily by mouth. Start taking on:  11/27/2016   azithromycin 500 MG tablet Commonly known as:  ZITHROMAX Take 1 tablet (500 mg total) daily by mouth.   buPROPion 150 MG 12 hr tablet Commonly known as:  WELLBUTRIN SR Take 1 tablet (150 mg total) 2 (two) times daily with a meal by mouth.   fluticasone 44 MCG/ACT inhaler Commonly known as:  FLOVENT HFA Inhale 1 puff into the lungs 2 (two) times daily.   ipratropium-albuterol 0.5-2.5 (3) MG/3ML Soln Commonly known as:  DUONEB Take 3 mLs 3 (three) times daily by nebulization.   nicotine 21 mg/24hr patch Commonly known as:  NICODERM CQ - dosed in mg/24 hours Place 1 patch (21 mg total) daily onto the skin. Start taking on:  11/27/2016   pravastatin 10 MG tablet Commonly known as:  PRAVACHOL Take 1 tablet (10 mg total) daily at 6 PM by mouth.   predniSONE 20 MG tablet Commonly known as:  DELTASONE Take 2 tablets for 5 days,            Durable Medical Equipment  (From admission, onward)        Start     Ordered   11/26/16 0946  For home use only DME Nebulizer machine  Once    Question:  Patient needs a nebulizer to treat with the following condition  Answer:  Bronchospasm   11/26/16 0945     Follow-up Information    Alfonse Spruce, FNP. Go on 12/12/2016.   Specialty:  Family Medicine Why:   post hospital follow up scheduled for 12/12/2016 at 8:30am Contact information: [place] [address] [phone]          Allergies  Allergen Reactions  . Codeine Itching  . Penicillins Itching and Other (See Comments)    Has patient had a PCN reaction causing immediate rash, facial/tongue/throat swelling, SOB or lightheadedness with hypotension: No Has patient had a PCN reaction causing severe rash  involving mucus membranes or skin necrosis: No Has patient had a PCN reaction that required hospitalization No Has patient had a PCN reaction occurring within the last 10 years: No If all of the above answers are "NO", then may proceed with Cephalosporin use.    Consultations:  none   Procedures/Studies: Dg Chest 2 View  Result Date: 11/23/2016 CLINICAL DATA:  Asthma exacerbation. EXAM: CHEST  2 VIEW COMPARISON:  11/18/2016 FINDINGS: Cardiomediastinal silhouette is normal. Mediastinal contours appear intact. There is no evidence of focal airspace consolidation, pleural effusion or pneumothorax. Chronic changes of COPD and mild bronchiectasis. Osseous structures are without acute abnormality. Soft tissues are grossly normal. IMPRESSION: Chronic changes of COPD and mild bronchiectasis. Electronically Signed   By: Fidela Salisbury M.D.   On: 11/23/2016 18:41   Dg Chest 2 View  Result Date: 11/18/2016 CLINICAL DATA:  Dyspnea and shortness of breath for the past 4 days since running out is inhaler. Onset of chills 3 days ago. History of asthma- COPD, community-acquired pneumonia, current smoker. EXAM: CHEST  2 VIEW COMPARISON:  Chest x-ray of March 26, 2016 FINDINGS:  The lungs are mildly hyperinflated. The interstitial markings are coarse but less conspicuous than on the previous study. The heart and pulmonary vascularity are normal. The mediastinum is normal in width. There is gentle dextrocurvature centered in the mid to lower thoracic spine. There is multilevel degenerative disc disease of the thoracic spine. IMPRESSION: Chronic changes of asthma -COPD in the patient's smoking history. No pneumonia, CHF, nor other acute cardiopulmonary abnormality. Electronically Signed   By: David  Martinique M.D.   On: 11/18/2016 10:49      Subjective: Denies dyspnea. He is breathing better.   Discharge Exam: Vitals:   11/26/16 0708 11/26/16 1422  BP:    Pulse: 85 86  Resp: 20 20  Temp:    SpO2: 94%  95%   Vitals:   11/25/16 2210 11/26/16 0525 11/26/16 0708 11/26/16 1422  BP: (!) 157/91 (!) 143/78    Pulse: (!) 114 76 85 86  Resp: 18 20 20 20   Temp: 98.7 F (37.1 C) 98.4 F (36.9 C)    TempSrc: Oral Oral    SpO2: (!) 88% 92% 94% 95%  Weight:      Height:        General: Pt is alert, awake, not in acute distress Cardiovascular: RRR, S1/S2 +, no rubs, no gallops Respiratory: CTA bilaterally, no wheezing, no rhonchi Abdominal: Soft, NT, ND, bowel sounds + Extremities: no edema, no cyanosis    The results of significant diagnostics from this hospitalization (including imaging, microbiology, ancillary and laboratory) are listed below for reference.     Microbiology: Recent Results (from the past 240 hour(s))  Respiratory Panel by PCR     Status: Abnormal   Collection Time: 11/23/16 11:40 PM  Result Value Ref Range Status   Adenovirus NOT DETECTED NOT DETECTED Final   Coronavirus 229E NOT DETECTED NOT DETECTED Final   Coronavirus HKU1 NOT DETECTED NOT DETECTED Final   Coronavirus NL63 NOT DETECTED NOT DETECTED Final   Coronavirus OC43 NOT DETECTED NOT DETECTED Final   Metapneumovirus NOT DETECTED NOT DETECTED Final   Rhinovirus / Enterovirus DETECTED (A) NOT DETECTED Final   Influenza A NOT DETECTED NOT DETECTED Final   Influenza B NOT DETECTED NOT DETECTED Final   Parainfluenza Virus 1 NOT DETECTED NOT DETECTED Final   Parainfluenza Virus 2 NOT DETECTED NOT DETECTED Final   Parainfluenza Virus 3 NOT DETECTED NOT DETECTED Final   Parainfluenza Virus 4 NOT DETECTED NOT DETECTED Final   Respiratory Syncytial Virus NOT DETECTED NOT DETECTED Final   Bordetella pertussis NOT DETECTED NOT DETECTED Final   Chlamydophila pneumoniae NOT DETECTED NOT DETECTED Final   Mycoplasma pneumoniae NOT DETECTED NOT DETECTED Final     Labs: BNP (last 3 results) No results for input(s): BNP in the last 8760 hours. Basic Metabolic Panel: Recent Labs  Lab 11/23/16 2025  NA 138  K 3.5   CL 101  CO2 30  GLUCOSE 157*  BUN 14  CREATININE 1.06  CALCIUM 8.1*   Liver Function Tests: No results for input(s): AST, ALT, ALKPHOS, BILITOT, PROT, ALBUMIN in the last 168 hours. No results for input(s): LIPASE, AMYLASE in the last 168 hours. No results for input(s): AMMONIA in the last 168 hours. CBC: Recent Labs  Lab 11/23/16 2025 11/24/16 0428  WBC 9.5 8.1  NEUTROABS 6.1  --   HGB 13.9 14.5  HCT 42.7 44.4  MCV 98.2 98.0  PLT 199 226   Cardiac Enzymes: No results for input(s): CKTOTAL, CKMB, CKMBINDEX, TROPONINI in the last 168  hours. BNP: Invalid input(s): POCBNP CBG: Recent Labs  Lab 11/25/16 1231 11/25/16 1708 11/25/16 2214 11/26/16 0800 11/26/16 1206  GLUCAP 79 116* 196* 116* 127*   D-Dimer No results for input(s): DDIMER in the last 72 hours. Hgb A1c No results for input(s): HGBA1C in the last 72 hours. Lipid Profile No results for input(s): CHOL, HDL, LDLCALC, TRIG, CHOLHDL, LDLDIRECT in the last 72 hours. Thyroid function studies No results for input(s): TSH, T4TOTAL, T3FREE, THYROIDAB in the last 72 hours.  Invalid input(s): FREET3 Anemia work up No results for input(s): VITAMINB12, FOLATE, FERRITIN, TIBC, IRON, RETICCTPCT in the last 72 hours. Urinalysis    Component Value Date/Time   COLORURINE YELLOW 05/28/2014 1848   APPEARANCEUR CLOUDY (A) 05/28/2014 1848   LABSPEC 1.011 05/28/2014 1848   PHURINE 6.0 05/28/2014 1848   GLUCOSEU NEGATIVE 05/28/2014 1848   HGBUR MODERATE (A) 05/28/2014 1848   BILIRUBINUR NEGATIVE 05/28/2014 1848   KETONESUR NEGATIVE 05/28/2014 1848   PROTEINUR NEGATIVE 05/28/2014 1848   UROBILINOGEN 1.0 05/28/2014 1848   NITRITE NEGATIVE 05/28/2014 1848   LEUKOCYTESUR NEGATIVE 05/28/2014 1848   Sepsis Labs Invalid input(s): PROCALCITONIN,  WBC,  LACTICIDVEN Microbiology Recent Results (from the past 240 hour(s))  Respiratory Panel by PCR     Status: Abnormal   Collection Time: 11/23/16 11:40 PM  Result Value Ref  Range Status   Adenovirus NOT DETECTED NOT DETECTED Final   Coronavirus 229E NOT DETECTED NOT DETECTED Final   Coronavirus HKU1 NOT DETECTED NOT DETECTED Final   Coronavirus NL63 NOT DETECTED NOT DETECTED Final   Coronavirus OC43 NOT DETECTED NOT DETECTED Final   Metapneumovirus NOT DETECTED NOT DETECTED Final   Rhinovirus / Enterovirus DETECTED (A) NOT DETECTED Final   Influenza A NOT DETECTED NOT DETECTED Final   Influenza B NOT DETECTED NOT DETECTED Final   Parainfluenza Virus 1 NOT DETECTED NOT DETECTED Final   Parainfluenza Virus 2 NOT DETECTED NOT DETECTED Final   Parainfluenza Virus 3 NOT DETECTED NOT DETECTED Final   Parainfluenza Virus 4 NOT DETECTED NOT DETECTED Final   Respiratory Syncytial Virus NOT DETECTED NOT DETECTED Final   Bordetella pertussis NOT DETECTED NOT DETECTED Final   Chlamydophila pneumoniae NOT DETECTED NOT DETECTED Final   Mycoplasma pneumoniae NOT DETECTED NOT DETECTED Final     Time coordinating discharge: Over 30 minutes  SIGNED:   Elmarie Shiley, MD  [facility] 11/26/2016, 5:38 PM Pager   If 7PM-7AM, please contact night-coverage [email or web address] Password [email or web address]

## 2016-11-26 NOTE — Progress Notes (Signed)
Patient received home nebulizer.  Respiratory therapist reviewed instructions with pt.  Discharge teaching given, including activity, diet, follow-up appoints, and medications. Patient verbalized understanding of all discharge instructions. IV access was d/c'd. Vitals are stable. Skin is intact except as charted in most recent assessments. Pt to be escorted out by NT with bus passes.

## 2016-12-12 ENCOUNTER — Inpatient Hospital Stay: Payer: Self-pay | Admitting: Family Medicine

## 2017-06-11 ENCOUNTER — Emergency Department (HOSPITAL_COMMUNITY): Payer: Self-pay

## 2017-06-11 ENCOUNTER — Other Ambulatory Visit: Payer: Self-pay

## 2017-06-11 ENCOUNTER — Inpatient Hospital Stay (HOSPITAL_COMMUNITY)
Admission: EM | Admit: 2017-06-11 | Discharge: 2017-06-13 | DRG: 192 | Disposition: A | Discharge status: Home / Self Care | Payer: Self-pay | Attending: Student in an Organized Health Care Education/Training Program | Admitting: Student in an Organized Health Care Education/Training Program

## 2017-06-11 ENCOUNTER — Encounter (HOSPITAL_COMMUNITY): Payer: Self-pay | Admitting: Internal Medicine

## 2017-06-11 DIAGNOSIS — F17210 Nicotine dependence, cigarettes, uncomplicated: Secondary | ICD-10-CM | POA: Diagnosis present

## 2017-06-11 DIAGNOSIS — E876 Hypokalemia: Secondary | ICD-10-CM | POA: Diagnosis present

## 2017-06-11 DIAGNOSIS — B348 Other viral infections of unspecified site: Secondary | ICD-10-CM | POA: Diagnosis present

## 2017-06-11 DIAGNOSIS — J189 Pneumonia, unspecified organism: Secondary | ICD-10-CM

## 2017-06-11 DIAGNOSIS — Z809 Family history of malignant neoplasm, unspecified: Secondary | ICD-10-CM

## 2017-06-11 DIAGNOSIS — Z880 Allergy status to penicillin: Secondary | ICD-10-CM

## 2017-06-11 DIAGNOSIS — I10 Essential (primary) hypertension: Secondary | ICD-10-CM | POA: Diagnosis present

## 2017-06-11 DIAGNOSIS — Z8249 Family history of ischemic heart disease and other diseases of the circulatory system: Secondary | ICD-10-CM

## 2017-06-11 DIAGNOSIS — J441 Chronic obstructive pulmonary disease with (acute) exacerbation: Principal | ICD-10-CM | POA: Diagnosis present

## 2017-06-11 DIAGNOSIS — Z888 Allergy status to other drugs, medicaments and biological substances status: Secondary | ICD-10-CM

## 2017-06-11 DIAGNOSIS — J069 Acute upper respiratory infection, unspecified: Secondary | ICD-10-CM | POA: Diagnosis present

## 2017-06-11 DIAGNOSIS — F1290 Cannabis use, unspecified, uncomplicated: Secondary | ICD-10-CM

## 2017-06-11 LAB — CBC WITH DIFFERENTIAL/PLATELET
Abs Immature Granulocytes: 0 10*3/uL (ref 0.0–0.1)
BASOS ABS: 0 10*3/uL (ref 0.0–0.1)
BASOS PCT: 0 %
Eosinophils Absolute: 0 10*3/uL (ref 0.0–0.7)
Eosinophils Relative: 0 %
HCT: 43.7 % (ref 39.0–52.0)
Hemoglobin: 14.5 g/dL (ref 13.0–17.0)
IMMATURE GRANULOCYTES: 0 %
Lymphocytes Relative: 21 %
Lymphs Abs: 1.2 10*3/uL (ref 0.7–4.0)
MCH: 30.9 pg (ref 26.0–34.0)
MCHC: 33.2 g/dL (ref 30.0–36.0)
MCV: 93 fL (ref 78.0–100.0)
MONOS PCT: 20 %
Monocytes Absolute: 1.1 10*3/uL — ABNORMAL HIGH (ref 0.1–1.0)
NEUTROS PCT: 59 %
Neutro Abs: 3.4 10*3/uL (ref 1.7–7.7)
PLATELETS: 143 10*3/uL — AB (ref 150–400)
RBC: 4.7 MIL/uL (ref 4.22–5.81)
RDW: 13.4 % (ref 11.5–15.5)
WBC: 5.8 10*3/uL (ref 4.0–10.5)

## 2017-06-11 LAB — COMPREHENSIVE METABOLIC PANEL
ALBUMIN: 3.7 g/dL (ref 3.5–5.0)
ALT: 19 U/L (ref 17–63)
ANION GAP: 8 (ref 5–15)
AST: 26 U/L (ref 15–41)
Alkaline Phosphatase: 80 U/L (ref 38–126)
BILIRUBIN TOTAL: 1 mg/dL (ref 0.3–1.2)
BUN: 9 mg/dL (ref 6–20)
CHLORIDE: 100 mmol/L — AB (ref 101–111)
CO2: 27 mmol/L (ref 22–32)
Calcium: 8.3 mg/dL — ABNORMAL LOW (ref 8.9–10.3)
Creatinine, Ser: 1.17 mg/dL (ref 0.61–1.24)
GFR calc Af Amer: >60 mL/min (ref >60)
GFR calc non Af Amer: >60 mL/min (ref >60)
GLUCOSE: 171 mg/dL — AB (ref 65–99)
POTASSIUM: 3.4 mmol/L — AB (ref 3.5–5.1)
Sodium: 135 mmol/L (ref 135–145)
TOTAL PROTEIN: 6.4 g/dL — AB (ref 6.5–8.1)

## 2017-06-11 LAB — URINALYSIS, ROUTINE W REFLEX MICROSCOPIC
BILIRUBIN URINE: NEGATIVE
Bacteria, UA: NONE SEEN
Glucose, UA: NEGATIVE mg/dL
Ketones, ur: NEGATIVE mg/dL
Leukocytes, UA: NEGATIVE
NITRITE: NEGATIVE
PH: 5 (ref 5.0–8.0)
Protein, ur: NEGATIVE mg/dL
Specific Gravity, Urine: 1.014 (ref 1.005–1.030)

## 2017-06-11 LAB — I-STAT CG4 LACTIC ACID, ED: LACTIC ACID, VENOUS: 1.8 mmol/L (ref 0.5–1.9)

## 2017-06-11 LAB — PROTIME-INR
INR: 1.08
PROTHROMBIN TIME: 13.9 s (ref 11.4–15.2)

## 2017-06-11 MED ORDER — ENOXAPARIN SODIUM 40 MG/0.4ML ~~LOC~~ SOLN
40.0000 mg | SUBCUTANEOUS | Status: DC
  Administered 2017-06-11 – 2017-06-12 (×2): 40 mg via SUBCUTANEOUS
  Filled 2017-06-11 (×2): qty 0.4

## 2017-06-11 MED ORDER — ACETAMINOPHEN 650 MG RE SUPP: 650.0000 mg | Freq: Four times a day (QID) | RECTAL | Status: DC | PRN

## 2017-06-11 MED ORDER — ACETAMINOPHEN 325 MG PO TABS
650.0000 mg | ORAL_TABLET | Freq: Once | ORAL | Status: AC | PRN
  Administered 2017-06-11: 650 mg via ORAL
  Filled 2017-06-11: qty 2

## 2017-06-11 MED ORDER — SODIUM CHLORIDE 0.9 % IV SOLN
1.0000 g | Freq: Once | INTRAVENOUS | Status: AC
  Administered 2017-06-11: 1 g via INTRAVENOUS
  Filled 2017-06-11: qty 10

## 2017-06-11 MED ORDER — POTASSIUM CHLORIDE CRYS ER 20 MEQ PO TBCR
40.0000 meq | EXTENDED_RELEASE_TABLET | Freq: Once | ORAL | Status: AC
  Administered 2017-06-11: 40 meq via ORAL
  Filled 2017-06-11: qty 2

## 2017-06-11 MED ORDER — AZITHROMYCIN 500 MG IV SOLR
500.0000 mg | Freq: Once | INTRAVENOUS | Status: AC
  Administered 2017-06-11: 500 mg via INTRAVENOUS
  Filled 2017-06-11: qty 500

## 2017-06-11 MED ORDER — ALBUTEROL SULFATE (2.5 MG/3ML) 0.083% IN NEBU
2.5000 mg | INHALATION_SOLUTION | RESPIRATORY_TRACT | Status: DC | PRN
Start: 2017-06-11 — End: 2017-06-13

## 2017-06-11 MED ORDER — IPRATROPIUM-ALBUTEROL 0.5-2.5 (3) MG/3ML IN SOLN
3.0000 mL | Freq: Four times a day (QID) | RESPIRATORY_TRACT | Status: DC
  Administered 2017-06-11 – 2017-06-13 (×6): 3 mL via RESPIRATORY_TRACT
  Filled 2017-06-11 (×6): qty 3

## 2017-06-11 MED ORDER — NICOTINE 21 MG/24HR TD PT24
21.0000 mg | MEDICATED_PATCH | Freq: Every day | TRANSDERMAL | Status: DC
  Administered 2017-06-11 – 2017-06-12 (×2): 21 mg via TRANSDERMAL
  Filled 2017-06-11 (×2): qty 1

## 2017-06-11 MED ORDER — IPRATROPIUM-ALBUTEROL 0.5-2.5 (3) MG/3ML IN SOLN
3.0000 mL | RESPIRATORY_TRACT | Status: AC
  Administered 2017-06-11 (×3): 3 mL via RESPIRATORY_TRACT
  Filled 2017-06-11 (×3): qty 3

## 2017-06-11 MED ORDER — PREDNISONE 20 MG PO TABS
40.0000 mg | ORAL_TABLET | Freq: Every day | ORAL | Status: DC
  Administered 2017-06-12 – 2017-06-13 (×2): 40 mg via ORAL
  Filled 2017-06-11 (×2): qty 2

## 2017-06-11 MED ORDER — AZITHROMYCIN 500 MG PO TABS
250.0000 mg | ORAL_TABLET | Freq: Every day | ORAL | Status: DC
  Administered 2017-06-12: 250 mg via ORAL
  Filled 2017-06-11: qty 1

## 2017-06-11 MED ORDER — PREDNISONE 20 MG PO TABS
60.0000 mg | ORAL_TABLET | Freq: Once | ORAL | Status: AC
  Administered 2017-06-11: 60 mg via ORAL
  Filled 2017-06-11: qty 3

## 2017-06-11 MED ORDER — ACETAMINOPHEN 325 MG PO TABS: 650.0000 mg | ORAL_TABLET | Freq: Four times a day (QID) | ORAL | Status: DC | PRN

## 2017-06-11 MED ORDER — ASPIRIN EC 81 MG PO TBEC
81.0000 mg | DELAYED_RELEASE_TABLET | Freq: Every day | ORAL | Status: DC
  Administered 2017-06-12 – 2017-06-13 (×2): 81 mg via ORAL
  Filled 2017-06-11 (×3): qty 1

## 2017-06-11 NOTE — ED Notes (Signed)
Pt was asked if he could provide urine specimen.pt stated he is unable to provide sample at this time. Will try again later. Urinal left at bedside.

## 2017-06-11 NOTE — ED Notes (Signed)
Pt asked to get undressed and into a hospital gown. Pt stated the he was "cold as hell and am not going to get undressed unless we know they will be keeping me". Attempted to negotiate with pt by offering warm blankets and pt refused to get undressed.

## 2017-06-11 NOTE — ED Notes (Signed)
Patient transported to X-ray 

## 2017-06-11 NOTE — ED Provider Notes (Signed)
[facility] EMERGENCY DEPARTMENT Provider Note   CSN: [id number] Arrival date & time: 06/11/17  1615     History   Chief Complaint Chief Complaint  Patient presents with  . Shortness of Breath    HPI [name] is a 62 y.o. male.  The history is provided by the patient.  Shortness of Breath  This is a new problem. The average episode lasts 3 days. The problem occurs continuously.The current episode started more than 2 days ago. The problem has been gradually worsening. Associated symptoms include a fever, cough, sputum production and wheezing. Pertinent negatives include no sore throat, no ear pain, no chest pain, no vomiting, no abdominal pain, no rash and no leg swelling. It is unknown what precipitated the problem. He has tried beta-agonist inhalers for the symptoms. The treatment provided no relief. Associated medical issues include COPD.    Past Medical History:  Diagnosis Date  . Asthma   . COPD (chronic obstructive pulmonary disease) ([place])   . Hypertension     Patient Active Problem List   Diagnosis Date Noted  . COPD exacerbation ([place]) 06/11/2017  . COPD with acute exacerbation ([place]) 11/23/2016  . Diabetes mellitus screening 03/12/2016  . Essential hypertension 10/22/2013  . Elevated BP 07/19/2013  . Obesity 02/04/2012  . Acute respiratory failure with hypoxia ([place]) 02/04/2012  . COPD with exacerbation ([place]) 02/03/2012  . Community acquired pneumonia 02/03/2012  . Tobacco abuse 02/03/2012    Past Surgical History:  Procedure Laterality Date  . HAND SURGERY          Home Medications    Prior to Admission medications   Medication Sig Start Date End Date Taking? Authorizing Provider  albuterol (PROVENTIL HFA;VENTOLIN HFA) 108 (90 Base) MCG/ACT inhaler Inhale 2 puffs into the lungs every 4 (four) hours as needed for wheezing or shortness of breath. 07/24/16  Yes Hairston, Mandesia R, FNP  amLODipine (NORVASC) 5 MG tablet Take 1  tablet (5 mg total) daily by mouth. Patient not taking: Reported on 06/11/2017 11/27/16   Niel Hummer A, MD  aspirin 81 MG EC tablet Take 1 tablet (81 mg total) daily by mouth. Patient not taking: Reported on 06/11/2017 11/27/16   Regalado, Jerald Kief A, MD  azithromycin (ZITHROMAX) 500 MG tablet Take 1 tablet (500 mg total) daily by mouth. Patient not taking: Reported on 06/11/2017 11/26/16   Regalado, Jerald Kief A, MD  buPROPion (WELLBUTRIN SR) 150 MG 12 hr tablet Take 1 tablet (150 mg total) 2 (two) times daily with a meal by mouth. Patient not taking: Reported on 06/11/2017 11/26/16   Regalado, Jerald Kief A, MD  fluticasone (FLOVENT HFA) 44 MCG/ACT inhaler Inhale 1 puff into the lungs 2 (two) times daily. Patient not taking: Reported on 06/11/2017 07/24/16   Alfonse Spruce, FNP  ipratropium-albuterol (DUONEB) 0.5-2.5 (3) MG/3ML SOLN Take 3 mLs 3 (three) times daily by nebulization. Patient not taking: Reported on 06/11/2017 11/26/16   Regalado, Jerald Kief A, MD  nicotine (NICODERM CQ - DOSED IN MG/24 HOURS) 21 mg/24hr patch Place 1 patch (21 mg total) daily onto the skin. Patient not taking: Reported on 06/11/2017 11/27/16   Regalado, Jerald Kief A, MD  pravastatin (PRAVACHOL) 10 MG tablet Take 1 tablet (10 mg total) daily at 6 PM by mouth. Patient not taking: Reported on 06/11/2017 11/26/16   Niel Hummer A, MD  predniSONE (DELTASONE) 20 MG tablet Take 2 tablets for 5 days, Patient not taking: Reported on 06/11/2017 11/26/16   Elmarie Shiley, MD  Family History Family History  Problem Relation Age of Onset  . Hypertension Paternal Uncle   . Cancer Maternal Grandmother   . Hypertension Maternal Grandfather     Social History Social History   Tobacco Use  . Smoking status: Current Some Day Smoker    Packs/day: 1.00    Years: 45.00    Pack years: 45.00    Types: Cigarettes  . Smokeless tobacco: Never Used  Substance Use Topics  . Alcohol use: Yes    Comment: "every once in awhile"  .  Drug use: No    Types: Marijuana    Comment: occasionally marijuana     Allergies   Codeine and Penicillins   Review of Systems Review of Systems  Constitutional: Positive for fever. Negative for chills.  HENT: Negative for ear pain and sore throat.   Eyes: Negative for pain and visual disturbance.  Respiratory: Positive for cough, sputum production, shortness of breath and wheezing.   Cardiovascular: Negative for chest pain, palpitations and leg swelling.  Gastrointestinal: Negative for abdominal pain and vomiting.  Genitourinary: Negative for dysuria and hematuria.  Musculoskeletal: Negative for arthralgias and back pain.  Skin: Negative for color change and rash.  Neurological: Negative for seizures and syncope.  All other systems reviewed and are negative.    Physical Exam Updated Vital Signs BP 128/69   Pulse (!) 105   Temp (S) 99.2 F (37.3 C) (Oral)   Resp (!) 23   Ht 5' 9.5" (1.765 m)   Wt 106.6 kg (235 lb)   SpO2 94%   BMI 34.21 kg/m   Physical Exam  Constitutional: He appears well-developed and well-nourished.  HENT:  Head: Normocephalic and atraumatic.  Eyes: Conjunctivae are normal.  Neck: Neck supple.  Cardiovascular: Normal rate and regular rhythm.  No murmur heard. Pulmonary/Chest: Accessory muscle usage present. He has decreased breath sounds. He has wheezes.  Diffuse expiratory wheezing and diminished inspiratory breath sounds.  Abdominal: Soft. There is no tenderness.  Musculoskeletal: He exhibits no edema.  Neurological: He is alert.  Skin: Skin is warm and dry.  Psychiatric: He has a normal mood and affect.  Nursing note and vitals reviewed.    ED Treatments / Results  Labs (all labs ordered are listed, but only abnormal results are displayed) Labs Reviewed  COMPREHENSIVE METABOLIC PANEL - Abnormal; Notable for the following components:      Result Value   Potassium 3.4 (*)    Chloride 100 (*)    Glucose, Bld 171 (*)    Calcium  8.3 (*)    Total Protein 6.4 (*)    All other components within normal limits  CBC WITH DIFFERENTIAL/PLATELET - Abnormal; Notable for the following components:   Platelets 143 (*)    Monocytes Absolute 1.1 (*)    All other components within normal limits  URINALYSIS, ROUTINE W REFLEX MICROSCOPIC - Abnormal; Notable for the following components:   Hgb urine dipstick MODERATE (*)    All other components within normal limits  CULTURE, BLOOD (ROUTINE X 2)  CULTURE, BLOOD (ROUTINE X 2)  RESPIRATORY PANEL BY PCR  PROTIME-INR  BASIC METABOLIC PANEL  I-STAT CG4 LACTIC ACID, ED  I-STAT TROPONIN, ED    EKG EKG Interpretation  Date/Time:  Wednesday Jun 11 2017 16:26:01 EDT Ventricular Rate:  118 PR Interval:  118 QRS Duration: 76 QT Interval:  300 QTC Calculation: 420 R Axis:   77 Text Interpretation:  Sinus tachycardia Otherwise normal ECG Sinus tachycardia Confirmed by Thomasene Lot, Courteney (  [id number]) on 06/11/2017 4:48:30 PM   Radiology Dg Chest 2 View  Result Date: 06/11/2017 CLINICAL DATA:  Shortness of breath, cough and fever for 5 days. History of COPD. EXAM: CHEST - 2 VIEW COMPARISON:  Chest radiograph November 23, 2016 FINDINGS: Cardiomediastinal silhouette is normal. No pleural effusions or focal consolidations. Mild chronic interstitial changes increased lung volumes, flattened hemidiaphragms. Trachea projects midline and there is no pneumothorax. Soft tissue planes and included osseous structures are non-suspicious. Moderate degenerative change of the spine. IMPRESSION: COPD.  No acute cardiopulmonary process. Electronically Signed   By: Elon Alas M.D.   On: 06/11/2017 17:23    Procedures Procedures (including critical care time)  Medications Ordered in ED Medications  azithromycin (ZITHROMAX) tablet 250 mg (has no administration in time range)  ipratropium-albuterol (DUONEB) 0.5-2.5 (3) MG/3ML nebulizer solution 3 mL (has no administration in time range)  albuterol  (PROVENTIL) (2.5 MG/3ML) 0.083% nebulizer solution 2.5 mg (has no administration in time range)  predniSONE (DELTASONE) tablet 40 mg (has no administration in time range)  potassium chloride SA (K-DUR,KLOR-CON) CR tablet 40 mEq (has no administration in time range)  nicotine (NICODERM CQ - dosed in mg/24 hours) patch 21 mg (has no administration in time range)  aspirin EC tablet 81 mg (has no administration in time range)  enoxaparin (LOVENOX) injection 40 mg (has no administration in time range)  acetaminophen (TYLENOL) tablet 650 mg (has no administration in time range)    Or  acetaminophen (TYLENOL) suppository 650 mg (has no administration in time range)  acetaminophen (TYLENOL) tablet 650 mg (650 mg Oral Given 06/11/17 1629)  ipratropium-albuterol (DUONEB) 0.5-2.5 (3) MG/3ML nebulizer solution 3 mL (3 mLs Nebulization Given 06/11/17 1818)  predniSONE (DELTASONE) tablet 60 mg (60 mg Oral Given 06/11/17 1826)  cefTRIAXone (ROCEPHIN) 1 g in sodium chloride 0.9 % 100 mL IVPB (0 g Intravenous Stopped 06/11/17 1912)  azithromycin (ZITHROMAX) 500 mg in sodium chloride 0.9 % 250 mL IVPB (500 mg Intravenous New Bag/Given 06/11/17 1912)     Initial Impression / Assessment and Plan / ED Course  I have reviewed the triage vital signs and the nursing notes.  Pertinent labs & imaging results that were available during my care of the patient were reviewed by me and considered in my medical decision making (see chart for details).    Patient is a 62 year old male with history as above, notable for COPD not on any home oxygen who presents with 3 days of worsening shortness of breath.  Here, patient is febrile and tachycardic.  Sepsis protocol initiated.  Temperature and heart rate improved with treatment of his fever.  He has had cough with increased sputum production.  He has diffuse expiratory wheezing here.  His work of breathing overall is okay, however he does have a new oxygen requirement.  Chest x-ray  was read as normal, however there appears to be a possible early right lower lobe infiltrate.  I will cover him with Rocephin and azithromycin.  Blood cultures pending.  Given his new oxygen requirement, patient admitted to hospitalist for further treatment.  Final Clinical Impressions(s) / ED Diagnoses   Final diagnoses:  COPD exacerbation ([place])  Community acquired pneumonia, unspecified laterality    ED Discharge Orders    None       Clifton James, MD 06/11/17 2043    Macarthur Critchley, MD 06/13/17 0002

## 2017-06-11 NOTE — H&P (Addendum)
Date: 06/11/2017               Patient Name:  [name] MRN: [id number]  DOB: 07/24/1955 Age / Sex: 62 y.o., male   PCP: Alfonse Spruce, FNP         Medical Service: Internal Medicine Teaching Service         Attending Physician: Dr. Evette Doffing, Mallie Mussel, *    First Contact: Gates Rigg, MS4 Pager: [phone]  Second Contact: Dr. Philipp Ovens Pager: [phone]       After Hours (After 5p/  First Contact Pager: [phone]  weekends / holidays): Second Contact Pager: [phone]   Chief Complaint: Shortness of Breath  History of Present Illness: [name] is a 62 yo M with Hx of COPD, Asthma, and HTN presented with shortness of breath. Patient reports feeling fine and able to function normally until about 2 weeks ago when he began experiencing some mild shortness of breath. He states he began having significant shortness of breath on Sunday. He has been short of breath at rest and it is worse with any activity. He endorse cough productive of clear sputum. He has been out of his home COPD inhalers since January. He did start using 2 inhalers on Sunday: Advair, which he found in his car and Albuterol, which he borrowed from a friend. He has been using each inhaler about every 2 hours with minimal relief. He states he did get some relief from drinking hot teas and laying with his knees on the floor and his chest on the bed. On Wednesday mornging he decided to come to the Ed but state it took him about 12hrs to gather the energy to come in. He states he has eaten very little secondary to feeling unwell the past few 2 days, but does have an appetite now. He endorses some chest discomfort tightness with his shortness of breath. He also endorses a fever, tiredness, 2 loose stools, orthopnea, and a sick contact of his mother (likely URI). He denies chest pain, nausea, headache, lightheadedness.  In the ED, Vital sign significant for HR 80s-100s, RR 18-27. CBC WNL; BMP showed K 2.4, Glu 171;  PT/INR, Trop, Lactate WNL; UA showed only moderate Hgb. Blood Cultutre were obtained. EKG showed sinus tachycardia. CXR showed Changes of COPD and no acute process. Patient received Tylenol, Duoneb x3, 60mg  Prednisone, Ceftriaxone, azithromycin, and 38mEq KCl. Patient to be admitted for further workup and care.  Meds:  Current Meds  Medication Sig  . albuterol (PROVENTIL HFA;VENTOLIN HFA) 108 (90 Base) MCG/ACT inhaler Inhale 2 puffs into the lungs every 4 (four) hours as needed for wheezing or shortness of breath.    Allergies: Allergies as of 06/11/2017 - Review Complete 06/11/2017  Allergen Reaction Noted  . Codeine Itching 12/11/2010  . Penicillins Itching and Other (See Comments) 12/11/2010   Past Medical History:  Diagnosis Date  . Asthma   . COPD (chronic obstructive pulmonary disease) ([place])   . Hypertension     Family History: Family History  Problem Relation Age of Onset  . Hypertension Paternal Uncle   . Cancer Maternal Grandmother   . Hypertension Maternal Grandfather   - Reviewed on admission  Social History: Social History   Tobacco Use  . Smoking status: Current Some Day Smoker    Packs/day: 1.00    Years: 45.00    Pack years: 45.00    Types: Cigarettes  . Smokeless tobacco: Never Used  Substance Use Topics  .  Alcohol use: Yes    Comment: "every once in awhile"  . Drug use: No    Types: Marijuana    Comment: occasionally marijuana  - Reviewed on admission  Review of Systems: A complete ROS was negative except as per HPI.  Physical Exam: Blood pressure 139/67, pulse 92, temperature (S) 99.2 F (37.3 C), temperature source Oral, resp. rate 16, height 5' 9.5" (1.765 m), weight 235 lb (106.6 kg), SpO2 96 %. Physical Exam  Constitutional: He is oriented to person, place, and time. He appears well-developed and well-nourished. No distress.  HENT:  Head: Normocephalic and atraumatic.  Eyes: EOM are normal. Right eye exhibits no discharge. Left eye  exhibits no discharge.  Neck:  Multiple cystic masses noted on posterior neck, present for years per patient  Cardiovascular: Normal rate, regular rhythm, normal heart sounds and intact distal pulses.  Pulmonary/Chest: Effort normal. No respiratory distress. He has wheezes.  Diffuse wheezing  Abdominal: Soft. Bowel sounds are normal. He exhibits no distension. There is no tenderness.  Musculoskeletal: He exhibits edema. He exhibits no tenderness or deformity.  1+ LLE edema, Trace RLE edema  Neurological: He is alert and oriented to person, place, and time.  Skin: Skin is warm and dry.    EKG: personally reviewed my interpretation is sinus tachycardia.  CXR: personally reviewed my interpretation is good inspiration and rotation, somewhat over penetrated, COPD changes (flattened diaphragm, increased AP diameter) and no acute process.  Assessment & Plan by Problem: 62 yo M with Hx of COPD, Asthma, and HTN presented with shortness of breath.  COPD Exacerbation: Patient presented with 2 weeks of mild dyspnea and 4 days of severe dyspnea in the setting of COPD off medications for 6 months. Diffuse wheezing on exam. Patient responded well to Duoneb x3 in ED and was breathing and speaking comfortably on room air when examined for admission. Patient also received 60mg  Prednisone in the ED. Patient did report a sick contact of his mother who he lives with and who had recent URI symptoms. > Patient will need to have arrangements for obtaining maintenance medications and follow up appointments. Cost contributed to his medication nonadherance. On Advair and Symbicort in the past. At last OTPT visit he was on fluticasone BID and Albuterol PRN. - Cardiac Monitoring - Start Dulera BID - 40mg  Prednisone daily x 4d - Azithromycin 250mg  Daily x 4d - Duoneb q6h - Albuterol q4h PRN - Ambulatory pulse ox - RVP  Hypokalemia: Patient noted to have K 3.4 on admission. Received 72mEq PO KCl. - Am BMP  FEN:  Heart VTE ppx: Lovenox Code Status: FULL  Dispo: Admit patient to Observation with expected length of stay less than 2 midnights.  Signed: Neva Seat, MD 06/11/2017, 10:09 PM  Pager: [phone]

## 2017-06-11 NOTE — ED Notes (Signed)
Pts Sp02 stats noted to have dropped to 86% on room air. This EMT brought the pt warm blankets (pt complained of being cold). Pt found to be kneeling on the floor next to bed. This EMT asked pt "What are you doing on the floor ". Pt responded "trying to get some oxygen". Pt asked to return to the bed instead of being on the floor. Pt also informed that I would be putting him on oxygen via nasal cannula so he could better breathe. Pt returned to bed. Warm blankets placed on pt and pt was placed on 3L/[place]. Pt also informed of the need of a urine sample.pt stated the he urinated when he was in the lobby. Urinal left at bedside. Call light within reach. Pt resting. Sp02 now 95% on 3L/[place].

## 2017-06-12 ENCOUNTER — Encounter (HOSPITAL_COMMUNITY): Payer: Self-pay | Admitting: General Practice

## 2017-06-12 DIAGNOSIS — I10 Essential (primary) hypertension: Secondary | ICD-10-CM

## 2017-06-12 DIAGNOSIS — I82409 Acute embolism and thrombosis of unspecified deep veins of unspecified lower extremity: Secondary | ICD-10-CM

## 2017-06-12 LAB — RESPIRATORY PANEL BY PCR
Adenovirus: NOT DETECTED
Bordetella pertussis: NOT DETECTED
CHLAMYDOPHILA PNEUMONIAE-RVPPCR: NOT DETECTED
CORONAVIRUS HKU1-RVPPCR: NOT DETECTED
CORONAVIRUS NL63-RVPPCR: NOT DETECTED
CORONAVIRUS OC43-RVPPCR: NOT DETECTED
Coronavirus 229E: NOT DETECTED
INFLUENZA A-RVPPCR: NOT DETECTED
Influenza B: NOT DETECTED
METAPNEUMOVIRUS-RVPPCR: NOT DETECTED
Mycoplasma pneumoniae: NOT DETECTED
PARAINFLUENZA VIRUS 2-RVPPCR: NOT DETECTED
PARAINFLUENZA VIRUS 3-RVPPCR: DETECTED — AB
PARAINFLUENZA VIRUS 4-RVPPCR: NOT DETECTED
Parainfluenza Virus 1: NOT DETECTED
RHINOVIRUS / ENTEROVIRUS - RVPPCR: NOT DETECTED
Respiratory Syncytial Virus: NOT DETECTED

## 2017-06-12 LAB — BASIC METABOLIC PANEL
Anion gap: 8 (ref 5–15)
BUN: 9 mg/dL (ref 6–20)
CO2: 27 mmol/L (ref 22–32)
CREATININE: 0.99 mg/dL (ref 0.61–1.24)
Calcium: 8.4 mg/dL — ABNORMAL LOW (ref 8.9–10.3)
Chloride: 102 mmol/L (ref 101–111)
Glucose, Bld: 170 mg/dL — ABNORMAL HIGH (ref 65–99)
Potassium: 4.2 mmol/L (ref 3.5–5.1)
SODIUM: 137 mmol/L (ref 135–145)

## 2017-06-12 LAB — POCT I-STAT TROPONIN I: Troponin i, poc: 0.01 ng/mL (ref 0.00–0.08)

## 2017-06-12 MED ORDER — MOMETASONE FURO-FORMOTEROL FUM 100-5 MCG/ACT IN AERO
2.0000 | INHALATION_SPRAY | Freq: Two times a day (BID) | RESPIRATORY_TRACT | Status: DC
  Administered 2017-06-12 – 2017-06-13 (×4): 2 via RESPIRATORY_TRACT
  Filled 2017-06-12 (×2): qty 8.8

## 2017-06-12 NOTE — ED Notes (Signed)
Patient refused Duoneb , requesting coffee , respirations unlabored.

## 2017-06-12 NOTE — Progress Notes (Signed)
Patient received from [place] via bed. Patient is alert and oriented. Vital signs are stable. Skin assessment done with another nurse. Iv in place its  saline locked. Patient given instruction about call light and phone. Bed in low position and side rail up x2. Call bell in reach.

## 2017-06-12 NOTE — Progress Notes (Addendum)
   Subjective:  He was seen sitting up in bed this morning and was breathing without distress on room air. He states that he still feels short of breath with exertion but says it has improved from yesterday. He still endorses of mild cough with clear sputum. He denies chest pain. He says that he would like to have breathing treatments today and would be amenable to going home tomorrow.  Objective:  Vital signs in last 24 hours: Vitals:   06/12/17 0820 06/12/17 1127 06/12/17 1357 06/12/17 1401  BP:  138/82    Pulse:  (!) 103    Resp:  15    Temp:  98.1 F (36.7 C)    TempSrc:      SpO2: 93% 93% 98% 98%  Weight:      Height:       Weight change:   Intake/Output Summary (Last 24 hours) at 06/12/2017 1547 Last data filed at 06/11/2017 2012 Gross per 24 hour  Intake 350 ml  Output -  Net 350 ml   Gen: No acute distress. A&Ox3. Well nourished. Head and Neck: Normocephalic and atraumatic. No cervical lymphadenopathy on palpation. No eye drainage or discharge.  CV: RR and NR. Distal pulses 2+ bilaterally. No murmurs, rubs, or gallops. Pulmonary: Effort normal. Symmetric chest expansion. Expiratory wheezes bilaterally. No crackles or rales.  Skin: Skin is warm and dry. Extremities: No LE edema.  Assessment/Plan:  Active Problems: COPD Exacerbation HTN  Assessment & Plan by Problem: 62 yo M with Hx of COPD, Asthma, and HTN presented with shortness of breath. CXR findings and respiratory panel concerning for COPD exacerbation and RSV upper respiratory infection.  COPD Exacerbation: Patient presented with 2 weeks of mild dyspnea and 4 days of severe dyspnea in the setting of COPD off medications for 6 months. Recieved Duoneb x3, Prednisone 60 mg, and one dose of Rocephin in ED. Symptoms improved and has O2 sats have been >95 on RA. Has been afebrile today. No leukocytosis. Respiratory viral panel positive for parainfluenza. CXR negative for evidence of pneumonia but with hyperinflation  as expected, UA unremarkable. - Dulera BID - 40mg  Prednisone daily x 4d - Azithromycin 250mg  Daily x 4d - Duoneb q6h - Albuterol q4h PRN - Ambulatory pulse ox  HTN: Takes lisinopril at home. SBP's have been 120s-130s today off BP meds. -On nicotine patch -Continue to monitor.  FEN: Heart healthy diet VTE ppx: Lovenox  Code Status: FULL     Dispo: Anticipate discharge tomorrow pending good O2 saturation during ambulation.   LOS: 0 days   Thornton Papas, Medical Student 06/12/2017, 3:47 PM Pager: [phone]  Attestation for Student Documentation:  I personally was present and performed or re-performed the history, physical exam and medical decision-making activities of this service and have verified that the service and findings are accurately documented in the student's note.  Jule Ser, DO 06/12/2017, 4:30 PM

## 2017-06-13 DIAGNOSIS — F17200 Nicotine dependence, unspecified, uncomplicated: Secondary | ICD-10-CM

## 2017-06-13 DIAGNOSIS — Z885 Allergy status to narcotic agent status: Secondary | ICD-10-CM

## 2017-06-13 DIAGNOSIS — B348 Other viral infections of unspecified site: Secondary | ICD-10-CM

## 2017-06-13 MED ORDER — FLUTICASONE-SALMETEROL 100-50 MCG/DOSE IN AEPB: 1.0000 | INHALATION_SPRAY | Freq: Two times a day (BID) | RESPIRATORY_TRACT | 2 refills | Status: DC

## 2017-06-13 MED ORDER — BUPROPION HCL ER (SR) 150 MG PO TB12: 150.0000 mg | ORAL_TABLET | Freq: Two times a day (BID) | ORAL | 0 refills | Status: DC

## 2017-06-13 MED ORDER — AZITHROMYCIN 250 MG PO TABS: 250.0000 mg | ORAL_TABLET | Freq: Every day | ORAL | 0 refills | Status: AC

## 2017-06-13 MED ORDER — IPRATROPIUM-ALBUTEROL 0.5-2.5 (3) MG/3ML IN SOLN: 3.0000 mL | Freq: Four times a day (QID) | RESPIRATORY_TRACT | 0 refills | Status: DC | PRN

## 2017-06-13 MED ORDER — TIOTROPIUM BROMIDE MONOHYDRATE 18 MCG IN CAPS: 18.0000 ug | ORAL_CAPSULE | Freq: Every day | RESPIRATORY_TRACT | 2 refills | Status: DC

## 2017-06-13 MED ORDER — PREDNISONE 20 MG PO TABS: 40.0000 mg | ORAL_TABLET | Freq: Every day | ORAL | 0 refills | Status: AC

## 2017-06-13 MED FILL — predniSONE 20 MG TABS: 20 | 4 days supply | Qty: 4 | Fill #0

## 2017-06-13 MED FILL — SPIRIVA 18 MCG CP-HANDIHALE: 18 | 30 days supply | Qty: 30 | Fill #0

## 2017-06-13 MED FILL — IPRAT-ALBUT 0.5-3(2.5) MG/3: 0.5-2.5 (3) | 7 days supply | Qty: 90 | Fill #0

## 2017-06-13 MED FILL — BUPROPION SR 150 MG TABLET: 150 | 2 days supply | Qty: 4 | Fill #0

## 2017-06-13 MED FILL — AZITHROMYCIN 250 MG TABLET: 250 | 3 days supply | Qty: 3 | Fill #0

## 2017-06-13 MED FILL — **ADVAIR 100/50 DISKUS: 100-50 MCG | 14 days supply | Qty: 28 | Fill #0

## 2017-06-13 NOTE — Progress Notes (Signed)
Patient discharge instructions reiveiwed patient verbalized understanding using teach back. IV removed and patient given 1 script the other where called in. Patient has all listed belongings, clothing and cell phone.  Volunteers called.

## 2017-06-13 NOTE — Discharge Instructions (Signed)
[name], you were admitted for a COPD exacerbation and a cold. You were treated with antibiotics, steroids and breathing treatments. You will need to complete your course of antibiotics and prednisone at home, as well as take your new inhalers DAILY.   Please:  -Take Azithromycin once daily, starting this evening - for a total of 3 additional days -Take Prednisone once daily, starting tomorrow morning - for a total of 2 additional days -Take Spiriva and Advair DAILY - these help keep your COPD under control to reduce risk of exacerbations -Take Albuterol as needed for wheezing   You have been scheduled to follow-up in the [facility] for a one-time only Hospital Follow-up appointment 6/5 at 10:15 am. You will still need to make an appointment with your own primary care physician within the next few weeks as well.

## 2017-06-13 NOTE — Care Management Note (Signed)
Case Management Note  Patient Details  Name: [name] MRN: [id number] Date of Birth: August 24, 1955  Subjective/Objective:       COPD exacerbation              Action/Plan: Transition to home.  Pt to pickup Rx meds [facility], NCM made pt aware. Pt has nebulizer @ home. Pt states has transportation to home.  Expected Discharge Date:  06/13/17               Expected Discharge Plan:  Home/Self Care  In-House Referral:     Discharge planning Services  CM Consult  Post Acute Care Choice:    Choice offered to:       Status of Service:  Completed, signed off  If discussed at [place] of Stay Meetings, dates discussed:    Additional Comments:  Sharin Mons, RN 06/13/2017, 12:40 PM

## 2017-06-13 NOTE — Progress Notes (Signed)
SATURATION QUALIFICATIONS: (This note is used to comply with regulatory documentation for home oxygen)  Patient Saturations on Room Air at Rest = 93%  Patient Saturations on Room Air while Ambulating = 90-93%  Pt denied SOB or distress while ambulating. Steady gait, independent.

## 2017-06-13 NOTE — Progress Notes (Addendum)
   Subjective:  He was seen sitting up in bed this morning. He was breathing comfortably on room air. He says that his shortness of breath continues to improve. He denies any chest pain or cough. He feels ready to go home today on inhalers and follow up in a week.  Objective:  Vital signs in last 24 hours: Vitals:   06/13/17 0310 06/13/17 0543 06/13/17 0647 06/13/17 0859  BP:  (!) 152/114 (!) 136/93   Pulse:  94    Resp:  18    Temp:  98.1 F (36.7 C)    TempSrc:  Oral    SpO2: 95% 96%  98%  Weight:      Height:       Weight change:   Intake/Output Summary (Last 24 hours) at 06/13/2017 1114 Last data filed at 06/13/2017 0539 Gross per 24 hour  Intake 600 ml  Output -  Net 600 ml   Gen: No acute distress. A&Ox3. Well nourished. Head and Neck: Normocephalic and atraumatic. No cervical lymphadenopathy on palpation. No eye drainage or discharge.  CV: RR and NR. Distal pulses 2+ bilaterally. No murmurs, rubs, or gallops. Pulmonary: Effort normal. Symmetric chest expansion. Expiratory wheezes bilaterally. No crackles or rales.  Skin: Skin is warm and dry. Extremities: No LE edema.    Assessment/Plan:  Active Problems: COPD Exacerbation HTN  Assessment & Plan by Problem: 62 yo M with Hx of COPD, Asthma, and HTN presented with shortness of breath. CXR findings and respiratory panel concerning for COPD exacerbation with parainfluenza upper respiratory infection.  COPD Exacerbation:Patient presented with 2 weeks of mild dyspnea and 4 days of severe dyspnea in the setting of COPD off medications for 6 months. Recieved Duoneb x3, Prednisone 60 mg, and one dose of Rocephin in ED. Symptoms improved and has O2 sats have been >95 on RA. Has been afebrile today. No leukocytosis. Respiratory viral panel positive for parainfluenza. CXR negative for evidence of pneumonia but with hyperinflation as expected, UA unremarkable. Ambulatory SpO2's were 90-93% today on ambulation, 93% at rest.    - Dulera BID - 40mg  Prednisone daily x 5d - Azithromycin 250mg  Daily x 5d - Duoneb q6h - Albuterol q4h PRN - Will discharge with prescription for Spiriva and Advair  HTN: Takes lisinopril at home. SBP's have been 110s-130s today off BP meds. -On nicotine patch -Will plan to restart lisinopril on discharge  FEN: Heart healthy diet VTE ppx: Lovenox  Code Status: FULL     Dispo: Plan to discharge today and f/u in one week in the Internal Medicine clinic given good O2 saturation during ambulation.   LOS: 0 days   Thornton Papas, Medical Student 06/12/2017, 3:47 PM Pager: [phone]  Attestation for Student Documentation:  I personally was present and performed or re-performed the history, physical exam and medical decision-making activities of this service and have verified that the service and findings are accurately documented in the student's note. Patient saturated well on RA with ambulation; will be discharged home with Azithromycin and Prednisone to complete his 5-day course. Have also arranged affordable ICS/LABA/LAMA inhalers for patient to pick-up at [facility]. Will have 1x follow-up in [facility] early next week to evaluate progress.   Princessa Lesmeister, DO 06/13/2017, 7:45 PM

## 2017-06-13 NOTE — Discharge Summary (Addendum)
Name: [name] MRN: [id number] DOB: 11/20/1955 62 y.o. PCP: Alfonse Spruce, FNP  Date of Admission: 06/11/2017  4:20 PM Date of Discharge: 06/13/2017 Attending Physician: Axel Filler MD Discharge Diagnosis: 1. COPD Exacerbation  Active Problems:   COPD exacerbation ([place])   Discharge Medications: Allergies as of 06/13/2017      Reactions   Codeine Itching   Penicillins Itching, Other (See Comments)   Has patient had a PCN reaction causing immediate rash, facial/tongue/throat swelling, SOB or lightheadedness with hypotension: No Has patient had a PCN reaction causing severe rash involving mucus membranes or skin necrosis: No Has patient had a PCN reaction that required hospitalization No Has patient had a PCN reaction occurring within the last 10 years: No If all of the above answers are "NO", then may proceed with Cephalosporin use.      Medication List    STOP taking these medications   fluticasone 44 MCG/ACT inhaler Commonly known as:  FLOVENT HFA     TAKE these medications   albuterol 108 (90 Base) MCG/ACT inhaler Commonly known as:  PROVENTIL HFA;VENTOLIN HFA Inhale 2 puffs into the lungs every 4 (four) hours as needed for wheezing or shortness of breath.   amLODipine 5 MG tablet Commonly known as:  NORVASC Take 1 tablet (5 mg total) daily by mouth.   aspirin 81 MG EC tablet Take 1 tablet (81 mg total) daily by mouth.   azithromycin 250 MG tablet Commonly known as:  ZITHROMAX Take 1 tablet (250 mg total) by mouth daily for 3 days.   buPROPion 150 MG 12 hr tablet Commonly known as:  WELLBUTRIN SR Take 1 tablet (150 mg total) by mouth 2 (two) times daily with a meal for 2 days. Start taking on:  06/14/2017   Fluticasone-Salmeterol 100-50 MCG/DOSE Aepb Commonly known as:  ADVAIR DISKUS Inhale 1 puff into the lungs 2 (two) times daily.   ipratropium-albuterol 0.5-2.5 (3) MG/3ML Soln Commonly known as:  DUONEB Take 3 mLs by  nebulization every 6 (six) hours as needed (severe wheezing). What changed:    when to take this  reasons to take this   nicotine 21 mg/24hr patch Commonly known as:  NICODERM CQ - dosed in mg/24 hours Place 1 patch (21 mg total) daily onto the skin.   pravastatin 10 MG tablet Commonly known as:  PRAVACHOL Take 1 tablet (10 mg total) daily at 6 PM by mouth.   predniSONE 20 MG tablet Commonly known as:  DELTASONE Take 2 tablets (40 mg total) by mouth daily with breakfast for 2 days. Start taking on:  06/14/2017 What changed:    how much to take  how to take this  when to take this  additional instructions   tiotropium 18 MCG inhalation capsule Commonly known as:  SPIRIVA HANDIHALER Place 1 capsule (18 mcg total) into inhaler and inhale daily.       Disposition and follow-up:   DanielDuffy Slyvester Green was discharged from [facility] in Stable condition.  At the hospital follow up visit please address:  1.  COPD Exacerbation: Likely 2/2 to running out of home medications and acute parainfluenza virus URI. He received Duoneb and albuterol nebulizer daily as well as Dulera inhaler BID during his hospital stay. He was started on a course of 40 mg Prednisone for 5 days and Azithromycin for 5 days. On day of discharge his O2 saturations were 90-93 on ambulatory monitoring, he was breathing comfortably on room air, and  he was afebrile.  2.  Labs / imaging needed at time of follow-up: N/A  3.  Pending labs/ test needing follow-up: N/A  Follow-up Appointments: Follow-up Information    [place]. Go on 06/18/2017.   Why:  Please go to your hospital follow-up appointment at the [facility] 06/18/17 at 10:15 am. This is a one-time only appointment to follow-up COPD, you will need to make an appointment with your regular primary care physician within 1 month Contact information: [address]  [place] [facility] .   Contact information: [place] [phone] [place] Hospital Course by problem list: Active Problems:   COPD exacerbation ([place])   1. COPD Exacerbation- [name] presented with 2 weeks of worsening dyspnea and fatigue after running out of COPD medications 4 months ago. His temperature was 99.2 (37.3) and SpO2 was 88% on presentation to the ED. His RSV panel returned positive for parainfluenza virus. During the course of his hospital stay, his dyspnea gradually improved with Duoneb and albuterol nebs, Dulera inhaler, and a course of 40mg  Prednisone and Azithromycin. On discharge he was breathing comfortably on RA during ambulation and maintaining SpO2>90%. He was instructed to finish his course of Prednisone and Azithromycin and to start Spiriva and Advair inhalers which were sent to the [facility] [place].  Discharge Vitals:   BP (!) 136/93 (BP Location: Left Arm)   Pulse 94   Temp 98.1 F (36.7 C) (Oral)   Resp 18   Ht 5\' 9"  (1.753 m)   Wt 105.9 kg (233 lb 6.4 oz)   SpO2 98%   BMI 34.47 kg/m   Pertinent Labs, Studies, and Procedures:   Respiratory viral panel (06/11/17)- Parainfluenza virus detected  Discharge Instructions:  Mr. Deveon, Green came to the hospital with worsening shortness of breath and you were diagnosed with an exacerbation of your COPD based on labs and chest x-ray. You also tested positive for parainfluenza virus, which is a common virus in the community that may have temporarily worsened your COPD. Your body will clear this virus on its own, usually in 1-2 weeks. During your stay in the hospital you were given inhalers, steroids, and an antibiotic (azithromycin) to help with your breathing and reduce inflammation in your lungs. Please finish your course of Prednisone and Azithromycin upon discharge. You were also prescribed  two new inhalers (Spiriva and Advair) to pick up at the [facility] [place], which we recommend you start taking for treatment of your COPD. We look forward to seeing you at follow-up in our clinic next week.    Signed: Thornton Papas, Medical Student 06/13/2017, 6:58 PM   Pager: [phone]   Attestation for Student Documentation:  I personally was present and performed or re-performed the history, physical exam and medical decision-making activities of this service and have verified that the service and findings are accurately documented in the student's discharge summary.  Carrell Rahmani, DO 06/14/2017, 1:10 PM

## 2017-06-16 LAB — CULTURE, BLOOD (ROUTINE X 2)
Culture: NO GROWTH
Culture: NO GROWTH
SPECIAL REQUESTS: ADEQUATE

## 2017-06-18 ENCOUNTER — Encounter: Payer: Self-pay | Admitting: Internal Medicine

## 2017-06-18 ENCOUNTER — Other Ambulatory Visit: Payer: Self-pay

## 2017-06-18 ENCOUNTER — Ambulatory Visit (INDEPENDENT_AMBULATORY_CARE_PROVIDER_SITE_OTHER): Payer: Self-pay | Admitting: Internal Medicine

## 2017-06-18 DIAGNOSIS — Z7951 Long term (current) use of inhaled steroids: Secondary | ICD-10-CM

## 2017-06-18 DIAGNOSIS — Z720 Tobacco use: Secondary | ICD-10-CM

## 2017-06-18 DIAGNOSIS — Z8619 Personal history of other infectious and parasitic diseases: Secondary | ICD-10-CM

## 2017-06-18 DIAGNOSIS — J449 Chronic obstructive pulmonary disease, unspecified: Secondary | ICD-10-CM

## 2017-06-18 DIAGNOSIS — Z79899 Other long term (current) drug therapy: Secondary | ICD-10-CM

## 2017-06-18 DIAGNOSIS — I10 Essential (primary) hypertension: Secondary | ICD-10-CM

## 2017-06-18 DIAGNOSIS — F17200 Nicotine dependence, unspecified, uncomplicated: Secondary | ICD-10-CM

## 2017-06-18 NOTE — Progress Notes (Signed)
   CC: management of COPD  HPI:  [name] is a 62 y.o. male with PMH of HTN and COPD who presents for 1 time hospital follow-up of COPD exacerbation.  He was admitted to our service 5/29-5/31 with COPD exacerbation secondary to parainfluenza URI. He was treated with 5 days of prednisone and azithromycin, as well as bronchodilators. Ambulatory sats were completed prior to discharge without desaturation. Discharged with new prescriptions for Spiriva and Advair.  Since discharge, he reports feeling well. He denies shortness of breath or chest pain. He did complete the prednisone and azithromycin. He was able to pick up his new prescriptions and started using Advair, but has not yet started Spiriva because he had questions about what the difference between this medication and his albuterol was.  He is still smoking. He was discharged with a nicotine patch, but reports that his skin gets irritated after about 3-4 days of wearing the patch. He does switch out the patch every 24 hours and places the patch on alternating arms/different sites, but even with doing this, his skin gets itchy on day 3-4. The itchiness resolves after taking off the patch. He has not tried other medications for smoking cessation.  He also is not sure who his PCP is. I told him that he sees Fish farm manager at [facility]. Last appointment 07/2016. I encouraged him to call their office to schedule an appointment.  Past Medical History:  Diagnosis Date  . Asthma   . COPD (chronic obstructive pulmonary disease) ([place])   . Hypertension    Review of Systems:  GEN: Negative for fevers CV: Negative for chest pain  PULM: Negative for cough or SOB EXT: Negative for LE swelling  Physical Exam:  Vitals:   06/18/17 1027  BP: (!) 141/93  Pulse: (!) 101  Temp: 98.4 F (36.9 C)  TempSrc: Oral  SpO2: 95%  Weight: 243 lb 11.2 oz (110.5 kg)  Height: 5\' 9"  (1.753 m)   GEN: Sitting comfortably in  chair in NAD CV: NR & RR, no m/r/g PULM: CTAB, no wheezes or rales EXT: No LE edema  Assessment & Plan:   See Encounters Tab for problem based charting.  Patient discussed with Dr. Evette Doffing

## 2017-06-18 NOTE — Assessment & Plan Note (Signed)
Assessment Continues to smoke since discharge. I counseled him on the harms of smoking. Patient is interested in quitting, however he states that he has had a difficult time, particularly with the nicotine patches causing skin irritation.  Plan - Advised patient to buy OTC nicotine gum or inhalers - Counseled

## 2017-06-18 NOTE — Assessment & Plan Note (Signed)
Assessment Patient is here for a 1 time hospital follow-up visit after recent admission for COPD exacerbation, completed 5 days of antibiotics and steroids. He is doing well since discharge. He was able to pick up his new prescriptions for Spiriva and Advair. Has not started Spiriva because he had questions about the medication. I explained that Spiriva and Advair are maintenance medications to be used every day and the albuterol inhaler is an as needed medication for shortness of breath or wheezing. He expressed understanding and is agreeable to starting Spiriva.  Plan - Continue Spiriva and Advair - Continue albuterol inhaler PRN - Patient to call PCP at [facility] for an appointment

## 2017-06-18 NOTE — Patient Instructions (Signed)
[name],  It was good to see you today.  I am glad to hear that you are doing better since being in the hospital. Please continue using the Advair and start using the Spiriva every day. Use the albuterol as needed.  We also discussed quitting smoking as ways to help your lungs get better. Since the patches were not working, you can try nicotine gum or inhalers. You can buy either of these over the counter.  Please call the [facility] office to schedule an appointment to see your primary doctor within the next 4 weeks.

## 2017-06-19 NOTE — Progress Notes (Signed)
Internal Medicine Clinic Attending  Case discussed with Dr. Huang at the time of the visit.  We reviewed the resident's history and exam and pertinent patient test results.  I agree with the assessment, diagnosis, and plan of care documented in the resident's note. 

## 2017-08-25 ENCOUNTER — Other Ambulatory Visit: Payer: Self-pay

## 2017-08-25 ENCOUNTER — Encounter: Payer: Self-pay | Admitting: Family Medicine

## 2017-08-25 ENCOUNTER — Ambulatory Visit: Payer: Self-pay | Attending: Family Medicine | Admitting: Family Medicine

## 2017-08-25 VITALS — BP 149/89 | HR 85 | Temp 99.6°F | Resp 20 | Ht 69.0 in | Wt 245.0 lb

## 2017-08-25 DIAGNOSIS — Z7982 Long term (current) use of aspirin: Secondary | ICD-10-CM | POA: Insufficient documentation

## 2017-08-25 DIAGNOSIS — Z8249 Family history of ischemic heart disease and other diseases of the circulatory system: Secondary | ICD-10-CM | POA: Insufficient documentation

## 2017-08-25 DIAGNOSIS — R739 Hyperglycemia, unspecified: Secondary | ICD-10-CM | POA: Insufficient documentation

## 2017-08-25 DIAGNOSIS — Z79899 Other long term (current) drug therapy: Secondary | ICD-10-CM | POA: Insufficient documentation

## 2017-08-25 DIAGNOSIS — D696 Thrombocytopenia, unspecified: Secondary | ICD-10-CM

## 2017-08-25 DIAGNOSIS — Z885 Allergy status to narcotic agent status: Secondary | ICD-10-CM | POA: Insufficient documentation

## 2017-08-25 DIAGNOSIS — Z880 Allergy status to penicillin: Secondary | ICD-10-CM | POA: Insufficient documentation

## 2017-08-25 DIAGNOSIS — J449 Chronic obstructive pulmonary disease, unspecified: Secondary | ICD-10-CM

## 2017-08-25 DIAGNOSIS — I10 Essential (primary) hypertension: Secondary | ICD-10-CM | POA: Insufficient documentation

## 2017-08-25 DIAGNOSIS — J441 Chronic obstructive pulmonary disease with (acute) exacerbation: Secondary | ICD-10-CM

## 2017-08-25 DIAGNOSIS — F17210 Nicotine dependence, cigarettes, uncomplicated: Secondary | ICD-10-CM | POA: Insufficient documentation

## 2017-08-25 MED ORDER — FLUTICASONE-SALMETEROL 100-50 MCG/DOSE IN AEPB: 1.0000 | INHALATION_SPRAY | Freq: Two times a day (BID) | RESPIRATORY_TRACT | 6 refills | Status: DC

## 2017-08-25 MED ORDER — ALBUTEROL SULFATE HFA 108 (90 BASE) MCG/ACT IN AERS: 2.0000 | INHALATION_SPRAY | RESPIRATORY_TRACT | 3 refills | Status: DC | PRN

## 2017-08-25 MED ORDER — IPRATROPIUM-ALBUTEROL 0.5-2.5 (3) MG/3ML IN SOLN
3.0000 mL | Freq: Once | RESPIRATORY_TRACT | Status: AC
  Administered 2017-08-25: 3 mL via RESPIRATORY_TRACT

## 2017-08-25 MED ORDER — IPRATROPIUM-ALBUTEROL 0.5-2.5 (3) MG/3ML IN SOLN: 3.0000 mL | Freq: Four times a day (QID) | RESPIRATORY_TRACT | 6 refills | Status: DC | PRN

## 2017-08-25 MED ORDER — AMLODIPINE BESYLATE 5 MG PO TABS: 5.0000 mg | ORAL_TABLET | Freq: Every day | ORAL | 11 refills | Status: DC

## 2017-08-25 MED ORDER — TIOTROPIUM BROMIDE MONOHYDRATE 18 MCG IN CAPS: 18.0000 ug | ORAL_CAPSULE | Freq: Every day | RESPIRATORY_TRACT | 6 refills | Status: DC

## 2017-08-25 MED FILL — AMLODIPINE BESYLATE 5 MG TA: 5 | 30 days supply | Qty: 30 | Fill #0

## 2017-08-25 MED FILL — IPRAT-ALBUT 0.5-3(2.5) MG/3: 0.5-2.5 (3) | 30 days supply | Qty: 360 | Fill #0

## 2017-08-25 MED FILL — !ADVAIR 100/50 DISKUS: 100-50 | 30 days supply | Qty: 60 | Fill #0

## 2017-08-25 MED FILL — SPIRIVA 18 MCG CP-HANDIHALE: 18 | 30 days supply | Qty: 30 | Fill #0

## 2017-08-25 MED FILL — !VENTOLIN HFA INHALER: 108 (90 BAS | 16 days supply | Qty: 18 | Fill #0

## 2017-08-25 NOTE — Progress Notes (Signed)
Subjective:    Patient ID: [name], male    DOB: [birth year], 62 y.o.   MRN: [id number]  HPI  62 yo male seen in follow-up of chronic medical issues including COPD for which he is status post hospitalization in May of this year. On review of hospital records, patient had elevated blood sugar and low platelet count. Patient states that he has been out of his medications since late May or early June. Patient with complaint of SOB and some wheezing. He continues to smoke 10-12 cigarettes per day. Patient request a breathing treatment. Patient has an occasional cough but not daily. Patient has not been taking a daily aspirin, and not taking medication for his blood pressure and no cholesterol medication. Patient works as a Curator and states that some medications warn that he cannot be out in the sun while taking the medication and therefore he does not take any medication by mouth. Patient states that he currently feels well other than SOB/wheezing since being out of his medications. Patient denies any increased thirst or urinary frequency but states that he drinks a lot of sugar sweetened drinks such as Kool-AID and lemonade.   Past Medical History:  Diagnosis Date  . Asthma   . COPD (chronic obstructive pulmonary disease) ([place])   . Hypertension    Family History  Problem Relation Age of Onset  . Hypertension Paternal Uncle   . Cancer Maternal Grandmother   . Hypertension Maternal Grandfather    Social History   Tobacco Use  . Smoking status: Current Some Day Smoker    Packs/day: 1.00    Years: 45.00    Pack years: 45.00    Types: Cigarettes  . Smokeless tobacco: Never Used  Substance Use Topics  . Alcohol use: Yes    Comment: "every once in awhile"  . Drug use: No    Types: Marijuana    Comment: occasionally marijuana   Allergies  Allergen Reactions  . Codeine Itching  . Penicillins Itching and Other (See Comments)    Has patient had a PCN reaction causing immediate  rash, facial/tongue/throat swelling, SOB or lightheadedness with hypotension: No Has patient had a PCN reaction causing severe rash involving mucus membranes or skin necrosis: No Has patient had a PCN reaction that required hospitalization No Has patient had a PCN reaction occurring within the last 10 years: No If all of the above answers are "NO", then may proceed with Cephalosporin use.   Outpatient Encounter Medications as of 08/25/2017  Medication Sig  . albuterol (PROVENTIL HFA;VENTOLIN HFA) 108 (90 Base) MCG/ACT inhaler Inhale 2 puffs into the lungs every 4 (four) hours as needed for wheezing or shortness of breath.  Marland Kitchen amLODipine (NORVASC) 5 MG tablet Take 1 tablet (5 mg total) daily by mouth. (Patient not taking: Reported on 06/11/2017)  . aspirin 81 MG EC tablet Take 1 tablet (81 mg total) daily by mouth. (Patient not taking: Reported on 06/11/2017)  . Fluticasone-Salmeterol (ADVAIR DISKUS) 100-50 MCG/DOSE AEPB Inhale 1 puff into the lungs 2 (two) times daily.  Marland Kitchen ipratropium-albuterol (DUONEB) 0.5-2.5 (3) MG/3ML SOLN Take 3 mLs by nebulization every 6 (six) hours as needed (severe wheezing).  . nicotine polacrilex (NICORETTE) 2 MG gum Take 2 mg by mouth as needed.  . pravastatin (PRAVACHOL) 10 MG tablet Take 1 tablet (10 mg total) daily at 6 PM by mouth. (Patient not taking: Reported on 06/11/2017)  . tiotropium (SPIRIVA HANDIHALER) 18 MCG inhalation capsule Place 1 capsule (18 mcg total)  into inhaler and inhale daily.   No facility-administered encounter medications on file as of 08/25/2017.      Review of Systems  Constitutional: Positive for fatigue. Negative for chills and fever.  HENT: Negative for congestion, sore throat and trouble swallowing.   Respiratory: Positive for cough (cough is non-productive- does not occurr daily), shortness of breath and wheezing.   Cardiovascular: Negative for chest pain, palpitations and leg swelling.  Gastrointestinal: Positive for constipation  (occasional). Negative for abdominal pain and blood in stool.  Endocrine: Negative for polydipsia, polyphagia and polyuria.  Genitourinary: Negative for difficulty urinating, flank pain and frequency.  Neurological: Negative for dizziness and headaches.       Objective:   Physical Exam  Constitutional: He is oriented to person, place, and time. He appears well-nourished. No distress.  HENT:  Nose: Nose normal.  Mouth/Throat: No oropharyngeal exudate.  Eyes: Pupils are equal, round, and reactive to light. Conjunctivae and EOM are normal.  Neck: Normal range of motion. Neck supple. No thyromegaly present.  Cardiovascular: Normal rate and regular rhythm.  Pulmonary/Chest: Effort normal. No respiratory distress. He has wheezes.  Mild coarse breath sounds along with  decreased air movement and generalized wheeze  Abdominal: Soft. There is no tenderness.  Musculoskeletal:  No CVA tenderness; mild non-pitting distal LE edema and some coarsening of skin on the LE's  Neurological: He is alert and oriented to person, place, and time.  Vitals reviewed. BP (!) 149/89 (BP Location: Right Arm, Patient Position: Sitting, Cuff Size: Normal)   Pulse 85   Temp 99.6 F (37.6 C) (Oral)   Resp 20   Ht 5\' 9"  (1.753 m)   Wt 245 lb (111.1 kg)   SpO2 95%   BMI 36.18 kg/m        Assessment & Plan:  1. Chronic obstructive pulmonary disease with acute exacerbation ([place]) Patient given duo-neb treatment in the office with reported improvement in ease of breathing. Home medications refilled. Encouraged medication compliance and smoking cessation. - ipratropium-albuterol (DUONEB) 0.5-2.5 (3) MG/3ML nebulizer solution 3 mL - albuterol (PROVENTIL HFA;VENTOLIN HFA) 108 (90 Base) MCG/ACT inhaler; Inhale 2 puffs into the lungs every 4 (four) hours as needed for wheezing or shortness of breath.  Dispense: 54 g; Refill: 3  2. Chronic obstructive pulmonary disease, unspecified COPD type ([place]) Home medications  refilled at today's visit and urged compliance with medications as well as smoking cessation  3. Thrombocytopenia ([place]) Will repeat CBC as platelets low on labs done in May - CBC with Differential CBC Latest Ref Rng & Units 06/11/2017 11/24/2016 11/23/2016  WBC 4.0 - 10.5 K/uL 5.8 8.1 9.5  Hemoglobin 13.0 - 17.0 g/dL 14.5 14.5 13.9  Hematocrit 39.0 - 52.0 % 43.7 44.4 42.7  Platelets 150 - 400 K/uL 143(L) 226 199   4. Elevated blood sugar Patient with elevated blood sugar during hospitalization in May but was also on steroids for COPD exacerbation. Will recheck BMP and Hgb G2X. - Basic Metabolic Panel - HgB B2W BMP Latest Ref Rng & Units 06/12/2017 06/11/2017 11/23/2016  Glucose 65 - 99 mg/dL 170(H) 171(H) 157(H)  BUN 6 - 20 mg/dL 9 9 14   Creatinine 0.61 - 1.24 mg/dL 0.99 1.17 1.06  Sodium 135 - 145 mmol/L 137 135 138  Potassium 3.5 - 5.1 mmol/L 4.2 3.4(L) 3.5  Chloride 101 - 111 mmol/L 102 100(L) 101  CO2 22 - 32 mmol/L 27 27 30   Calcium 8.9 - 10.3 mg/dL 8.4(L) 8.3(L) 8.1(L)    5. Essential  hypertension Patient with elevated blood pressure/hypertension and non-compliance with medication. Explained that he could take the amlodopine without concern regarding sun exposure. Handout given on hypertension. Amlodopine RX refilled.   An After Visit Summary was printed and given to the patient.  Return for flu shot/COPD&HTN; schedule for [facility] paperwork.

## 2017-08-25 NOTE — Patient Instructions (Signed)

## 2017-08-25 NOTE — Progress Notes (Signed)
Patient stated he was coming in today to est care with new pcp and get medications refilled.

## 2017-08-26 LAB — CBC WITH DIFFERENTIAL/PLATELET
Basophils Absolute: 0 x10E3/uL (ref 0.0–0.2)
Basos: 1 %
EOS (ABSOLUTE): 0.2 x10E3/uL (ref 0.0–0.4)
Eos: 4 %
Hematocrit: 44.8 % (ref 37.5–51.0)
Hemoglobin: 15 g/dL (ref 13.0–17.7)
Immature Grans (Abs): 0 x10E3/uL (ref 0.0–0.1)
Immature Granulocytes: 0 %
Lymphocytes Absolute: 1.9 x10E3/uL (ref 0.7–3.1)
Lymphs: 46 %
MCH: 32.3 pg (ref 26.6–33.0)
MCHC: 33.5 g/dL (ref 31.5–35.7)
MCV: 96 fL (ref 79–97)
Monocytes Absolute: 0.4 x10E3/uL (ref 0.1–0.9)
Monocytes: 9 %
Neutrophils Absolute: 1.7 x10E3/uL (ref 1.4–7.0)
Neutrophils: 40 %
Platelets: 190 x10E3/uL (ref 150–450)
RBC: 4.65 x10E6/uL (ref 4.14–5.80)
RDW: 15.4 % (ref 12.3–15.4)
WBC: 4.2 x10E3/uL (ref 3.4–10.8)

## 2017-08-26 LAB — BASIC METABOLIC PANEL WITH GFR
BUN/Creatinine Ratio: 14 (ref 10–24)
BUN: 14 mg/dL (ref 8–27)
CO2: 26 mmol/L (ref 20–29)
Calcium: 8.8 mg/dL (ref 8.6–10.2)
Chloride: 101 mmol/L (ref 96–106)
Creatinine, Ser: 0.98 mg/dL (ref 0.76–1.27)
GFR calc Af Amer: 96 mL/min/1.73
GFR calc non Af Amer: 83 mL/min/1.73
Glucose: 85 mg/dL (ref 65–99)
Potassium: 3.5 mmol/L (ref 3.5–5.2)
Sodium: 140 mmol/L (ref 134–144)

## 2017-08-27 NOTE — Progress Notes (Signed)
If the patient calls the office please inform patient that his most recent labs were normal.

## 2017-08-27 NOTE — Progress Notes (Signed)
Patient was unable to be reached via phone due to not having a listed phone number on file. CMA sent out a letter informing patient to call office for labs.

## 2017-09-01 ENCOUNTER — Telehealth: Payer: Self-pay | Admitting: Family Medicine

## 2017-09-01 NOTE — Telephone Encounter (Signed)
Pt name and DOB verified. Patient is aware of results to lab test. Verbalized understanding

## 2017-09-01 NOTE — Telephone Encounter (Signed)
Pt called to request his lab results, please follow up when results have been completed and available

## 2017-09-18 ENCOUNTER — Ambulatory Visit: Payer: Self-pay | Attending: Family Medicine | Admitting: Family Medicine

## 2017-09-18 ENCOUNTER — Other Ambulatory Visit: Payer: Self-pay

## 2017-09-18 ENCOUNTER — Encounter: Payer: Self-pay | Admitting: Family Medicine

## 2017-09-18 VITALS — BP 150/79 | HR 95 | Temp 98.8°F | Resp 18 | Ht 69.0 in | Wt 248.6 lb

## 2017-09-18 DIAGNOSIS — I10 Essential (primary) hypertension: Secondary | ICD-10-CM

## 2017-09-18 DIAGNOSIS — Z880 Allergy status to penicillin: Secondary | ICD-10-CM | POA: Insufficient documentation

## 2017-09-18 DIAGNOSIS — F17210 Nicotine dependence, cigarettes, uncomplicated: Secondary | ICD-10-CM | POA: Insufficient documentation

## 2017-09-18 DIAGNOSIS — Z885 Allergy status to narcotic agent status: Secondary | ICD-10-CM | POA: Insufficient documentation

## 2017-09-18 DIAGNOSIS — J441 Chronic obstructive pulmonary disease with (acute) exacerbation: Secondary | ICD-10-CM | POA: Insufficient documentation

## 2017-09-18 DIAGNOSIS — Z79899 Other long term (current) drug therapy: Secondary | ICD-10-CM | POA: Insufficient documentation

## 2017-09-18 DIAGNOSIS — J449 Chronic obstructive pulmonary disease, unspecified: Secondary | ICD-10-CM

## 2017-09-18 MED ORDER — ALBUTEROL SULFATE HFA 108 (90 BASE) MCG/ACT IN AERS: 2.0000 | INHALATION_SPRAY | RESPIRATORY_TRACT | 3 refills | Status: DC | PRN

## 2017-09-18 MED ORDER — AMLODIPINE BESYLATE 10 MG PO TABS: 10.0000 mg | ORAL_TABLET | Freq: Every day | ORAL | 6 refills | Status: DC

## 2017-09-18 MED ORDER — FLUTICASONE-SALMETEROL 250-50 MCG/DOSE IN AEPB: 1.0000 | INHALATION_SPRAY | Freq: Two times a day (BID) | RESPIRATORY_TRACT | 3 refills | Status: DC

## 2017-09-18 NOTE — Patient Instructions (Signed)
DASH Eating Plan DASH stands for "Dietary Approaches to Stop Hypertension." The DASH eating plan is a healthy eating plan that has been shown to reduce high blood pressure (hypertension). It may also reduce your risk for type 2 diabetes, heart disease, and stroke. The DASH eating plan may also help with weight loss. What are tips for following this plan? General guidelines  Avoid eating more than 2,300 mg (milligrams) of salt (sodium) a day. If you have hypertension, you may need to reduce your sodium intake to 1,500 mg a day.  Limit alcohol intake to no more than 1 drink a day for nonpregnant women and 2 drinks a day for men. One drink equals 12 oz of beer, 5 oz of wine, or 1 oz of hard liquor.  Work with your health care provider to maintain a healthy body weight or to lose weight. Ask what an ideal weight is for you.  Get at least 30 minutes of exercise that causes your heart to beat faster (aerobic exercise) most days of the week. Activities may include walking, swimming, or biking.  Work with your health care provider or diet and nutrition specialist (dietitian) to adjust your eating plan to your individual calorie needs. Reading food labels  Check food labels for the amount of sodium per serving. Choose foods with less than 5 percent of the Daily Value of sodium. Generally, foods with less than 300 mg of sodium per serving fit into this eating plan.  To find whole grains, look for the word "whole" as the first word in the ingredient list. Shopping  Buy products labeled as "low-sodium" or "no salt added."  Buy fresh foods. Avoid canned foods and premade or frozen meals. Cooking  Avoid adding salt when cooking. Use salt-free seasonings or herbs instead of table salt or sea salt. Check with your health care provider or pharmacist before using salt substitutes.  Do not fry foods. Cook foods using healthy methods such as baking, boiling, grilling, and broiling instead.  Cook with  heart-healthy oils, such as olive, canola, soybean, or sunflower oil. Meal planning   Eat a balanced diet that includes: ? 5 or more servings of fruits and vegetables each day. At each meal, try to fill half of your plate with fruits and vegetables. ? Up to 6-8 servings of whole grains each day. ? Less than 6 oz of lean meat, poultry, or fish each day. A 3-oz serving of meat is about the same size as a deck of cards. One egg equals 1 oz. ? 2 servings of low-fat dairy each day. ? A serving of nuts, seeds, or beans 5 times each week. ? Heart-healthy fats. Healthy fats called Omega-3 fatty acids are found in foods such as flaxseeds and coldwater fish, like sardines, salmon, and mackerel.  Limit how much you eat of the following: ? Canned or prepackaged foods. ? Food that is high in trans fat, such as fried foods. ? Food that is high in saturated fat, such as fatty meat. ? Sweets, desserts, sugary drinks, and other foods with added sugar. ? Full-fat dairy products.  Do not salt foods before eating.  Try to eat at least 2 vegetarian meals each week.  Eat more home-cooked food and less restaurant, buffet, and fast food.  When eating at a restaurant, ask that your food be prepared with less salt or no salt, if possible. What foods are recommended? The items listed may not be a complete list. Talk with your dietitian about what   dietary choices are best for you. Grains Whole-grain or whole-wheat bread. Whole-grain or whole-wheat pasta. Brown rice. Oatmeal. Quinoa. Bulgur. Whole-grain and low-sodium cereals. Pita bread. Low-fat, low-sodium crackers. Whole-wheat flour tortillas. Vegetables Fresh or frozen vegetables (raw, steamed, roasted, or grilled). Low-sodium or reduced-sodium tomato and vegetable juice. Low-sodium or reduced-sodium tomato sauce and tomato paste. Low-sodium or reduced-sodium canned vegetables. Fruits All fresh, dried, or frozen fruit. Canned fruit in natural juice (without  added sugar). Meat and other protein foods Skinless chicken or turkey. Ground chicken or turkey. Pork with fat trimmed off. Fish and seafood. Egg whites. Dried beans, peas, or lentils. Unsalted nuts, nut butters, and seeds. Unsalted canned beans. Lean cuts of beef with fat trimmed off. Low-sodium, lean deli meat. Dairy Low-fat (1%) or fat-free (skim) milk. Fat-free, low-fat, or reduced-fat cheeses. Nonfat, low-sodium ricotta or cottage cheese. Low-fat or nonfat yogurt. Low-fat, low-sodium cheese. Fats and oils Soft margarine without trans fats. Vegetable oil. Low-fat, reduced-fat, or light mayonnaise and salad dressings (reduced-sodium). Canola, safflower, olive, soybean, and sunflower oils. Avocado. Seasoning and other foods Herbs. Spices. Seasoning mixes without salt. Unsalted popcorn and pretzels. Fat-free sweets. What foods are not recommended? The items listed may not be a complete list. Talk with your dietitian about what dietary choices are best for you. Grains Baked goods made with fat, such as croissants, muffins, or some breads. Dry pasta or rice meal packs. Vegetables Creamed or fried vegetables. Vegetables in a cheese sauce. Regular canned vegetables (not low-sodium or reduced-sodium). Regular canned tomato sauce and paste (not low-sodium or reduced-sodium). Regular tomato and vegetable juice (not low-sodium or reduced-sodium). Pickles. Olives. Fruits Canned fruit in a light or heavy syrup. Fried fruit. Fruit in cream or butter sauce. Meat and other protein foods Fatty cuts of meat. Ribs. Fried meat. Bacon. Sausage. Bologna and other processed lunch meats. Salami. Fatback. Hotdogs. Bratwurst. Salted nuts and seeds. Canned beans with added salt. Canned or smoked fish. Whole eggs or egg yolks. Chicken or turkey with skin. Dairy Whole or 2% milk, cream, and half-and-half. Whole or full-fat cream cheese. Whole-fat or sweetened yogurt. Full-fat cheese. Nondairy creamers. Whipped toppings.  Processed cheese and cheese spreads. Fats and oils Butter. Stick margarine. Lard. Shortening. Ghee. Bacon fat. Tropical oils, such as coconut, palm kernel, or palm oil. Seasoning and other foods Salted popcorn and pretzels. Onion salt, garlic salt, seasoned salt, table salt, and sea salt. Worcestershire sauce. Tartar sauce. Barbecue sauce. Teriyaki sauce. Soy sauce, including reduced-sodium. Steak sauce. Canned and packaged gravies. Fish sauce. Oyster sauce. Cocktail sauce. Horseradish that you find on the shelf. Ketchup. Mustard. Meat flavorings and tenderizers. Bouillon cubes. Hot sauce and Tabasco sauce. Premade or packaged marinades. Premade or packaged taco seasonings. Relishes. Regular salad dressings. Where to find more information:  National Heart, Lung, and Blood Institute: www.nhlbi.nih.gov  American Heart Association: www.heart.org Summary  The DASH eating plan is a healthy eating plan that has been shown to reduce high blood pressure (hypertension). It may also reduce your risk for type 2 diabetes, heart disease, and stroke.  With the DASH eating plan, you should limit salt (sodium) intake to 2,300 mg a day. If you have hypertension, you may need to reduce your sodium intake to 1,500 mg a day.  When on the DASH eating plan, aim to eat more fresh fruits and vegetables, whole grains, lean proteins, low-fat dairy, and heart-healthy fats.  Work with your health care provider or diet and nutrition specialist (dietitian) to adjust your eating plan to your individual   calorie needs. This information is not intended to replace advice given to you by your health care provider. Make sure you discuss any questions you have with your health care provider. Document Released: 12/20/2010 Document Revised: 12/25/2015 Document Reviewed: 12/25/2015 Elsevier Interactive Patient Education  2018 Elsevier Inc.  
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
## 2017-10-16 ENCOUNTER — Ambulatory Visit: Payer: Self-pay | Attending: Family Medicine | Admitting: Pharmacist

## 2017-10-16 ENCOUNTER — Encounter: Payer: Self-pay | Admitting: Pharmacist

## 2017-10-16 VITALS — BP 135/76 | HR 94

## 2017-10-16 DIAGNOSIS — I10 Essential (primary) hypertension: Secondary | ICD-10-CM

## 2017-10-16 MED FILL — !VENTOLIN HFA INHALER: 108 (90 BAS | 16 days supply | Qty: 18 | Fill #0

## 2017-10-16 MED FILL — SPIRIVA 18 MCG CP-HANDIHALE: 18 | 30 days supply | Qty: 30 | Fill #1

## 2017-10-16 MED FILL — AMLODIPINE BESYLATE 10 MG T: 10 | 30 days supply | Qty: 30 | Fill #0

## 2017-10-16 MED FILL — !ADVAIR 250/50 DISKUS: 250-50 | 30 days supply | Qty: 60 | Fill #0

## 2017-10-16 MED FILL — IPRAT-ALBUT 0.5-3(2.5) MG/3: 0.5-2.5 (3) | 30 days supply | Qty: 360 | Fill #1

## 2017-10-16 NOTE — Patient Instructions (Signed)
Thank you for coming to see [place] today.   Blood pressure today is improving  Continue taking amlodipine 10 mg but take before bed.   Limiting salt and caffeine, as well as exercising as able for at least 30 minutes for 5 days out of the week, can also help you lower your blood pressure.  Take your blood pressure at home if you are able. Please write down these numbers and bring them to your visits.  If you have any questions about medications, please call me [phone].  Lurena Joiner

## 2017-10-16 NOTE — Progress Notes (Signed)
   S:    PCP: Dr. Chapman Fitch  Patient arrives in good spirits. Presents to the clinic for hypertension evaluation, counseling, and management. Patient was referred by Dr. Chapman Fitch on 09/18/17.  BP at that visit 150/79. Amlodipine was increased to 10 mg daily.   Denies CP, SOB, HA or blurred vision. Endorses dizziness at home suspected to be due to amlodipine.  Patient denies adherence with medications. Lack of insurance exists as barrier. He has not picked up his amlodipine in ~1 week.  Current BP Medications include:   - amlodipine 10 mg  Dietary habits include:  - 5-6 cups of coffee a day - limits salt Exercise habits include: - None outside of work Family / Social history:  - FH: HTN (uncle, MGF) - Tobacco: 1 PPD smoker - Alcohol: Occasionally   Home BP readings:  - Does not monitor at home  O:  L arm after 5 minutes rest: 135/76, HR 94 Last 3 Office BP readings: BP Readings from Last 3 Encounters:  09/18/17 (!) 150/79  08/25/17 (!) 149/89  06/18/17 (!) 141/93   BMET    Component Value Date/Time   NA 140 08/25/2017 1034   K 3.5 08/25/2017 1034   CL 101 08/25/2017 1034   CO2 26 08/25/2017 1034   GLUCOSE 85 08/25/2017 1034   GLUCOSE 170 (H) 06/12/2017 0402   BUN 14 08/25/2017 1034   CREATININE 0.98 08/25/2017 1034   CREATININE 1.05 03/12/2016 1500   CALCIUM 8.8 08/25/2017 1034   GFRNONAA 83 08/25/2017 1034   GFRNONAA 77 03/12/2016 1500   GFRAA 96 08/25/2017 1034   GFRAA 89 03/12/2016 1500   Renal function: CrCl cannot be calculated (Patient's most recent lab result is older than the maximum 21 days allowed.).  A/P: Hypertension longstanding diagnosed currently close to goal. BP Goal <130/80 mmHg. Patient is not adherent with current medications.  -Continued amlodipine 10 mg. Encouraged adherence. Patient is able to pick up meds today. -Counseled on lifestyle modifications for blood pressure control including reduced dietary sodium, increased exercise, adequate  sleep  Results reviewed and written information provided. Total time in face-to-face counseling 15 minutes.   F/U Clinic Visit in 2 weeks.    Patient seen with  Dixon Boos, PharmD Candidate [employer]  Benard Halsted, PharmD, [place] [phone]

## 2017-10-30 ENCOUNTER — Encounter: Payer: Self-pay | Admitting: Pharmacist

## 2017-11-04 ENCOUNTER — Encounter: Payer: Self-pay | Admitting: Pharmacist

## 2017-11-21 MED FILL — SPIRIVA 18 MCG CP-HANDIHALE: 18 | 30 days supply | Qty: 30 | Fill #2

## 2017-11-21 MED FILL — AMLODIPINE BESYLATE 10 MG T: 10 | 30 days supply | Qty: 30 | Fill #1

## 2017-11-21 MED FILL — !ADVAIR 250/50 DISKUS: 250-50 | 30 days supply | Qty: 60 | Fill #1

## 2017-11-21 MED FILL — IPRAT-ALBUT 0.5-3(2.5) MG/3: 0.5-2.5 (3) | 30 days supply | Qty: 360 | Fill #2

## 2017-11-21 MED FILL — !VENTOLIN HFA INHALER: 108 (90 BAS | 16 days supply | Qty: 18 | Fill #1

## 2017-12-18 ENCOUNTER — Ambulatory Visit: Payer: Self-pay | Attending: Family Medicine | Admitting: Family Medicine

## 2017-12-18 ENCOUNTER — Encounter: Payer: Self-pay | Admitting: Family Medicine

## 2017-12-18 VITALS — BP 156/89 | HR 89 | Temp 98.1°F | Resp 16 | Wt 248.8 lb

## 2017-12-18 DIAGNOSIS — Z885 Allergy status to narcotic agent status: Secondary | ICD-10-CM | POA: Insufficient documentation

## 2017-12-18 DIAGNOSIS — Z880 Allergy status to penicillin: Secondary | ICD-10-CM | POA: Insufficient documentation

## 2017-12-18 DIAGNOSIS — I10 Essential (primary) hypertension: Secondary | ICD-10-CM

## 2017-12-18 DIAGNOSIS — J449 Chronic obstructive pulmonary disease, unspecified: Secondary | ICD-10-CM

## 2017-12-18 DIAGNOSIS — E669 Obesity, unspecified: Secondary | ICD-10-CM

## 2017-12-18 DIAGNOSIS — Z720 Tobacco use: Secondary | ICD-10-CM

## 2017-12-18 DIAGNOSIS — Z79899 Other long term (current) drug therapy: Secondary | ICD-10-CM

## 2017-12-18 DIAGNOSIS — Z6836 Body mass index (BMI) 36.0-36.9, adult: Secondary | ICD-10-CM | POA: Insufficient documentation

## 2017-12-18 DIAGNOSIS — F17210 Nicotine dependence, cigarettes, uncomplicated: Secondary | ICD-10-CM

## 2017-12-18 DIAGNOSIS — R7303 Prediabetes: Secondary | ICD-10-CM

## 2017-12-18 DIAGNOSIS — E7800 Pure hypercholesterolemia, unspecified: Secondary | ICD-10-CM

## 2017-12-18 MED ORDER — AMLODIPINE BESYLATE 10 MG PO TABS: 10.0000 mg | ORAL_TABLET | Freq: Every day | ORAL | 6 refills | Status: DC

## 2017-12-18 MED ORDER — TIOTROPIUM BROMIDE MONOHYDRATE 18 MCG IN CAPS: 18.0000 ug | ORAL_CAPSULE | Freq: Every day | RESPIRATORY_TRACT | 6 refills | Status: DC

## 2017-12-18 MED ORDER — ALBUTEROL SULFATE HFA 108 (90 BASE) MCG/ACT IN AERS: 2.0000 | INHALATION_SPRAY | RESPIRATORY_TRACT | 3 refills | Status: DC | PRN

## 2017-12-18 NOTE — Progress Notes (Addendum)
Subjective:    Patient ID: [name], male    DOB: 10-Feb-1955, 62 y.o.   MRN: [id number]  HPI 62 year old male with COPD and hypertension who was seen in follow-up.  At his last visit, patient continued to have continued issues with shortness of breath and patient had been on Advair and his dose was increased to see if this would help with his daily shortness of breath.  Patient's dose of amlodipine was also increased to 10 mg from prior 5 mg and information provided on DASH diet.  There had been some concerns as well the patient had not been compliant with his blood pressure medication use.  Patient did return on 10/16/2017 to meet with clinical pharmacist regarding his hypertension.  Patient's blood pressure at that visit was 135/76.      He reports that his is taking the increased dose of his BP medication. Patient denies any headaches or dizziness related to his BP. Patient continues to smoke. He is taking the medications for COPD. He thinks that his breathing is pretty good. Occasional SOB with exertion and occasional non-productive cough. Patient reports that he is not taking the cholesterol medication that was prescribed in the past. Patient was not aware of the past diagnosis of pre-diabetes. Patient denies any increased thirst, urinary frequency or increased hunger.         Past Medical History:  Diagnosis Date  . Asthma   . COPD (chronic obstructive pulmonary disease) ([place])   . Hypertension    Past Surgical History:  Procedure Laterality Date  . HAND SURGERY     Family History  Problem Relation Age of Onset  . Hypertension Paternal Uncle   . Cancer Maternal Grandmother   . Hypertension Maternal Grandfather    Social History   Tobacco Use  . Smoking status: Current Some Day Smoker    Packs/day: 1.00    Years: 45.00    Pack years: 45.00    Types: Cigarettes  . Smokeless tobacco: Never Used  Substance Use Topics  . Alcohol use: Yes    Comment: "every once in  awhile"  . Drug use: No    Types: Marijuana    Comment: occasionally marijuana   Allergies  Allergen Reactions  . Codeine Itching  . Penicillins Itching and Other (See Comments)    Has patient had a PCN reaction causing immediate rash, facial/tongue/throat swelling, SOB or lightheadedness with hypotension: No Has patient had a PCN reaction causing severe rash involving mucus membranes or skin necrosis: No Has patient had a PCN reaction that required hospitalization No Has patient had a PCN reaction occurring within the last 10 years: No If all of the above answers are "NO", then may proceed with Cephalosporin use.     Review of Systems  Constitutional: Negative for chills, fatigue and fever.  HENT: Negative for sore throat and trouble swallowing.   Respiratory: Positive for cough (occasional non-productive cough) and shortness of breath (occasional SOB with exertion).   Cardiovascular: Negative for chest pain, palpitations and leg swelling.  Gastrointestinal: Negative for abdominal pain, constipation and diarrhea.  Endocrine: Negative for polydipsia, polyphagia and polyuria.  Genitourinary: Negative for dysuria and frequency.  Musculoskeletal: Positive for arthralgias and back pain. Negative for gait problem and joint swelling.  Neurological: Negative for dizziness and headaches.       Objective:   Physical Exam BP (!) 156/89   Pulse 89   Temp 98.1 F (36.7 C) (Oral)   Resp 16  Wt 248 lb 12.8 oz (112.9 kg)   SpO2 95%   BMI 36.74 kg/m  nurse's notes and vital signs reviewed General-well-nourished, well-developed overweight older male in no acute distress ENT-TMs dull, nares with edematous nasal mucosa with mild clear nasal discharge, patient with posterior pharynx erythema, patient with a broken right upper molar but no edema or erythema of the gums and no tenderness over the area, no facial swelling Neck-supple, no lymphadenopathy, no thyromegaly, no carotid  bruit Lungs-clear to auscultation bilaterally Cardiovascular-regular rate and rhythm Back-no CVA tenderness Extremities-patient with some edema in the right distal lower extremity with impression markings of top of the socks just above the ankle, minimal to no distal left lower extremity edema       Assessment & Plan:  1. Essential hypertension Low sodium, DASH diet recommended along with continuation of amlodipine 10 mg once per day - Comprehensive metabolic panel - amLODipine (NORVASC) 10 MG tablet; Take 1 tablet (10 mg total) by mouth daily. To lower blood pressure  Dispense: 30 tablet; Refill: 6  2. COPD mixed type ([place]) Complete smoking cessation encouraged. Refills provided of albuterol HFA to use as needed for SOB as well as a refill of Spiriva - albuterol (PROVENTIL HFA;VENTOLIN HFA) 108 (90 Base) MCG/ACT inhaler; Inhale 2 puffs into the lungs every 4 (four) hours as needed for wheezing or shortness of breath.  Dispense: 54 g; Refill: 3 - tiotropium (SPIRIVA HANDIHALER) 18 MCG inhalation capsule; Place 1 capsule (18 mcg total) into inhaler and inhale daily.  Dispense: 30 capsule; Refill: 6  3. Prediabetes Patient with a Hgb A1c of 5.7 last year on 07/24/16 and will recheck today along with CMP. Healthy, low carb diet recommended - Comprehensive metabolic panel - Hemoglobin A1c  4. Elevated LDL cholesterol level Patient with LDL of 122 on his last lipid panel in April of 2018. Patient is no longer taking the previously prescribed Pravastatin. Patient will be notified of his results and treatment recommendations - Comprehensive metabolic panel - Lipid panel  5. Encounter for long-term (current) use of medications CMP done in follow-up of use of medications for the treatment of COPD and HTN - Comprehensive metabolic panel  6. Obesity (BMI 30-39.9) Weight loss through a healthy diet and exercise as tolerated is recommended  7. Tobacco use Patient was counseled on the need to  completely stop tobacco use especially in light of his COPD  An After Visit Summary was printed and given to the patient.  Allergies as of 12/18/2017      Reactions   Codeine Itching   Penicillins Itching, Other (See Comments)   Has patient had a PCN reaction causing immediate rash, facial/tongue/throat swelling, SOB or lightheadedness with hypotension: No Has patient had a PCN reaction causing severe rash involving mucus membranes or skin necrosis: No Has patient had a PCN reaction that required hospitalization No Has patient had a PCN reaction occurring within the last 10 years: No If all of the above answers are "NO", then may proceed with Cephalosporin use.      Medication List       Accurate as of December 18, 2017 11:59 PM. Always use your most recent med list.        albuterol 108 (90 Base) MCG/ACT inhaler Commonly known as:  PROVENTIL HFA;VENTOLIN HFA Inhale 2 puffs into the lungs every 4 (four) hours as needed for wheezing or shortness of breath.   amLODipine 10 MG tablet Commonly known as:  NORVASC Take 1 tablet (10  mg total) by mouth daily. To lower blood pressure   aspirin 81 MG EC tablet Take 1 tablet (81 mg total) daily by mouth.   Fluticasone-Salmeterol 250-50 MCG/DOSE Aepb Commonly known as:  ADVAIR DISKUS Inhale 1 puff into the lungs 2 (two) times daily.   ipratropium-albuterol 0.5-2.5 (3) MG/3ML Soln Commonly known as:  DUONEB Take 3 mLs by nebulization every 6 (six) hours as needed (severe wheezing).   nicotine polacrilex 2 MG gum Commonly known as:  NICORETTE Take 2 mg by mouth as needed.   pravastatin 10 MG tablet Commonly known as:  PRAVACHOL Take 1 tablet (10 mg total) daily at 6 PM by mouth.   tiotropium 18 MCG inhalation capsule Commonly known as:  SPIRIVA HANDIHALER Place 1 capsule (18 mcg total) into inhaler and inhale daily.      Return in about 4 months (around 04/19/2018).

## 2017-12-19 LAB — COMPREHENSIVE METABOLIC PANEL WITH GFR
ALT: 16 IU/L (ref 0–44)
AST: 18 IU/L (ref 0–40)
Albumin/Globulin Ratio: 2.1 (ref 1.2–2.2)
Albumin: 4.4 g/dL (ref 3.6–4.8)
Alkaline Phosphatase: 97 IU/L (ref 39–117)
BUN/Creatinine Ratio: 8 — ABNORMAL LOW (ref 10–24)
BUN: 7 mg/dL — ABNORMAL LOW (ref 8–27)
Bilirubin Total: 0.4 mg/dL (ref 0.0–1.2)
CO2: 27 mmol/L (ref 20–29)
Calcium: 9.3 mg/dL (ref 8.6–10.2)
Chloride: 100 mmol/L (ref 96–106)
Creatinine, Ser: 0.91 mg/dL (ref 0.76–1.27)
GFR calc Af Amer: 105 mL/min/1.73
GFR calc non Af Amer: 91 mL/min/1.73
Globulin, Total: 2.1 g/dL (ref 1.5–4.5)
Glucose: 97 mg/dL (ref 65–99)
Potassium: 3.8 mmol/L (ref 3.5–5.2)
Sodium: 139 mmol/L (ref 134–144)
Total Protein: 6.5 g/dL (ref 6.0–8.5)

## 2017-12-19 LAB — LIPID PANEL
Chol/HDL Ratio: 4.2 ratio (ref 0.0–5.0)
Cholesterol, Total: 194 mg/dL (ref 100–199)
HDL: 46 mg/dL
LDL Calculated: 131 mg/dL — ABNORMAL HIGH (ref 0–99)
Triglycerides: 86 mg/dL (ref 0–149)
VLDL Cholesterol Cal: 17 mg/dL (ref 5–40)

## 2017-12-19 LAB — HEMOGLOBIN A1C
Est. average glucose Bld gHb Est-mCnc: 120 mg/dL
Hgb A1c MFr Bld: 5.8 % — ABNORMAL HIGH (ref 4.8–5.6)

## 2017-12-26 ENCOUNTER — Telehealth: Payer: Self-pay | Admitting: *Deleted

## 2017-12-26 NOTE — Telephone Encounter (Signed)
Patient verified DOB Patient is aware of cholesterol being elevated and 5.8 being considered prediabetes. Patient is aware of needing low fat and carb diet and needing to start pravastatin again. No further questions.

## 2017-12-26 NOTE — Telephone Encounter (Signed)
Medical Assistant left message on patient's home and cell voicemail. Voicemail states to give a call back to Nubia with [facility] at [phone].  

## 2017-12-26 NOTE — Telephone Encounter (Signed)
-----   Message from Antony Blackbird, MD sent at 12/24/2017  8:01 PM EST ----- Please notify patient of normal CMP.  Patient does have mild increase in lipids with LDL/bad cholesterol of 131 and patient with mild increase in hemoglobin A1c at 5.8 suggestive of prediabetes.  Patient should follow a healthy diet that is low in fat and low in carbohydrates.  Please see if patient is still taking pravastatin 10 mg and if so, patient needs to have an increase in this medication or change in medication to help lower his cholesterol.

## 2018-01-16 MED FILL — AMLODIPINE BESYLATE 10 MG T: 10 | 30 days supply | Qty: 30 | Fill #0

## 2018-01-16 MED FILL — SPIRIVA 18 MCG CP-HANDIHALE: 18 | 30 days supply | Qty: 30 | Fill #0

## 2018-01-16 MED FILL — $VENTOLIN HFA 18G INHALER: 108 (90 BAS | 75 days supply | Qty: 54 | Fill #0

## 2018-01-28 ENCOUNTER — Encounter (HOSPITAL_COMMUNITY): Payer: Self-pay

## 2018-01-28 ENCOUNTER — Other Ambulatory Visit: Payer: Self-pay

## 2018-01-28 ENCOUNTER — Emergency Department (HOSPITAL_COMMUNITY)
Admission: EM | Admit: 2018-01-28 | Discharge: 2018-01-28 | Disposition: A | Discharge status: Home / Self Care | Payer: Self-pay | Attending: Emergency Medicine | Admitting: Emergency Medicine

## 2018-01-28 DIAGNOSIS — Z79899 Other long term (current) drug therapy: Secondary | ICD-10-CM | POA: Insufficient documentation

## 2018-01-28 DIAGNOSIS — J449 Chronic obstructive pulmonary disease, unspecified: Secondary | ICD-10-CM | POA: Insufficient documentation

## 2018-01-28 DIAGNOSIS — F17210 Nicotine dependence, cigarettes, uncomplicated: Secondary | ICD-10-CM | POA: Insufficient documentation

## 2018-01-28 DIAGNOSIS — Z7982 Long term (current) use of aspirin: Secondary | ICD-10-CM | POA: Insufficient documentation

## 2018-01-28 DIAGNOSIS — I10 Essential (primary) hypertension: Secondary | ICD-10-CM | POA: Insufficient documentation

## 2018-01-28 DIAGNOSIS — H1031 Unspecified acute conjunctivitis, right eye: Secondary | ICD-10-CM

## 2018-01-28 MED ORDER — TETRACAINE HCL 0.5 % OP SOLN
1.0000 [drp] | Freq: Once | OPHTHALMIC | Status: AC
  Administered 2018-01-28: 1 [drp] via OPHTHALMIC
  Filled 2018-01-28: qty 4

## 2018-01-28 MED ORDER — GENTAMICIN SULFATE 0.3 % OP SOLN: 1.0000 [drp] | OPHTHALMIC | 0 refills | Status: AC

## 2018-01-28 MED ORDER — FLUORESCEIN SODIUM 1 MG OP STRP
1.0000 | ORAL_STRIP | Freq: Once | OPHTHALMIC | Status: AC
  Administered 2018-01-28: 1 via OPHTHALMIC
  Filled 2018-01-28: qty 1

## 2018-01-28 NOTE — ED Triage Notes (Signed)
Pt endorses right eye swelling upon waking up yesterday, but it increased this morning. Pt denies pain unless he touches it. Pt endorses eye is watering. 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

## 2018-01-28 NOTE — ED Notes (Signed)
Patient verbalizes understanding of discharge instructions. Opportunity for questioning and answers were provided. Armband removed by staff, pt discharged from ED. Pt ambulatory to lobby.  

## 2018-01-28 NOTE — ED Provider Notes (Signed)
[facility] EMERGENCY DEPARTMENT Provider Note   CSN: [id number] Arrival date & time: 01/28/18  1626     History   Chief Complaint Chief Complaint  Patient presents with  . Eye Problem    HPI [name] is a 63 y.o. male.  63 year old male with prior medical history as detailed below presents for evaluation of right eye itching and discharge.  Patient reports that his right eye began to bother him approximately 24 hours ago.  Patient reports feeling itchy.  He describes mild discharge and drainage from the right eye.  He does work with [employer].  He reports that he gets dust in his eyes almost every day.  He thought that he had dust in his eyes yesterday.  He washed his eye out thoroughly.  He denies fever.  He denies change in his visual acuity.  The history is provided by the patient and medical records.  Eye Problem  Location:  Right eye Quality:  Tearing and burning Severity:  Mild Onset quality:  Sudden Duration:  1 day Timing:  Constant Progression:  Waxing and waning Chronicity:  New Relieved by:  Nothing Worsened by:  Nothing Ineffective treatments:  None tried Associated symptoms: discharge     Past Medical History:  Diagnosis Date  . Asthma   . COPD (chronic obstructive pulmonary disease) ([place])   . Hypertension     Patient Active Problem List   Diagnosis Date Noted  . COPD (chronic obstructive pulmonary disease) ([place]) 11/23/2016  . Diabetes mellitus screening 03/12/2016  . Essential hypertension 10/22/2013  . Elevated BP 07/19/2013  . Obesity 02/04/2012  . Tobacco abuse 02/03/2012    Past Surgical History:  Procedure Laterality Date  . HAND SURGERY          Home Medications    Prior to Admission medications   Medication Sig Start Date End Date Taking? Authorizing Provider  albuterol (PROVENTIL HFA;VENTOLIN HFA) 108 (90 Base) MCG/ACT inhaler Inhale 2 puffs into the lungs every 4 (four) hours as needed for wheezing or  shortness of breath. 12/18/17   Fulp, Cammie, MD  amLODipine (NORVASC) 10 MG tablet Take 1 tablet (10 mg total) by mouth daily. To lower blood pressure 12/18/17   Fulp, Cammie, MD  aspirin 81 MG EC tablet Take 1 tablet (81 mg total) daily by mouth. Patient not taking: Reported on 06/11/2017 11/27/16   Regalado, Jerald Kief A, MD  Fluticasone-Salmeterol (ADVAIR DISKUS) 250-50 MCG/DOSE AEPB Inhale 1 puff into the lungs 2 (two) times daily. 09/18/17   Fulp, Cammie, MD  gentamicin (GARAMYCIN) 0.3 % ophthalmic solution Place 1 drop into the right eye every 4 (four) hours for 4 days. 01/28/18 02/01/18  Valarie Merino, MD  ipratropium-albuterol (DUONEB) 0.5-2.5 (3) MG/3ML SOLN Take 3 mLs by nebulization every 6 (six) hours as needed (severe wheezing). 08/25/17   Fulp, Cammie, MD  nicotine polacrilex (NICORETTE) 2 MG gum Take 2 mg by mouth as needed.    [provider]  pravastatin (PRAVACHOL) 10 MG tablet Take 1 tablet (10 mg total) daily at 6 PM by mouth. Patient not taking: Reported on 06/11/2017 11/26/16   Regalado, Jerald Kief A, MD  tiotropium (SPIRIVA HANDIHALER) 18 MCG inhalation capsule Place 1 capsule (18 mcg total) into inhaler and inhale daily. 12/18/17 12/18/18  Antony Blackbird, MD    Family History Family History  Problem Relation Age of Onset  . Hypertension Paternal Uncle   . Cancer Maternal Grandmother   . Hypertension Maternal Grandfather  Social History Social History   Tobacco Use  . Smoking status: Current Some Day Smoker    Packs/day: 1.00    Years: 45.00    Pack years: 45.00    Types: Cigarettes  . Smokeless tobacco: Never Used  Substance Use Topics  . Alcohol use: Yes    Comment: "every once in awhile"  . Drug use: No    Types: Marijuana    Comment: occasionally marijuana     Allergies   Codeine and Penicillins   Review of Systems Review of Systems  Eyes: Positive for discharge.  All other systems reviewed and are negative.    Physical Exam Updated Vital  Signs BP (!) 138/92   Pulse 78   Temp 98.5 F (36.9 C) (Oral)   Resp 16   Ht 5\' 9"  (1.753 m)   Wt 113.4 kg   SpO2 100%   BMI 36.92 kg/m   Physical Exam Vitals signs and nursing note reviewed.  Constitutional:      General: He is not in acute distress.    Appearance: He is well-developed.  HENT:     Head: Normocephalic and atraumatic.  Eyes:     Extraocular Movements: Extraocular movements intact.     Pupils: Pupils are equal, round, and reactive to light.     Comments: Right eye with mild conjunctival irritation.  Exam with fluorescein does not show foreign body, corneal abrasion, herpetic lesion, or other acute finding.  Neck:     Musculoskeletal: Normal range of motion and neck supple.  Cardiovascular:     Rate and Rhythm: Normal rate and regular rhythm.     Heart sounds: Normal heart sounds.  Pulmonary:     Effort: Pulmonary effort is normal. No respiratory distress.     Breath sounds: Normal breath sounds.  Abdominal:     General: There is no distension.     Palpations: Abdomen is soft.     Tenderness: There is no abdominal tenderness.  Musculoskeletal: Normal range of motion.        General: No deformity.  Skin:    General: Skin is warm and dry.  Neurological:     General: No focal deficit present.     Mental Status: He is alert and oriented to person, place, and time.      ED Treatments / Results  Labs (all labs ordered are listed, but only abnormal results are displayed) Labs Reviewed - No data to display  EKG None  Radiology No results found.  Procedures Procedures (including critical care time)  Medications Ordered in ED Medications  tetracaine (PONTOCAINE) 0.5 % ophthalmic solution 1 drop (1 drop Right Eye Given 01/28/18 1743)  fluorescein ophthalmic strip 1 strip (1 strip Right Eye Given 01/28/18 1743)     Initial Impression / Assessment and Plan / ED Course  I have reviewed the triage vital signs and the nursing notes.  Pertinent labs &  imaging results that were available during my care of the patient were reviewed by me and considered in my medical decision making (see chart for details).     MDM  Screen complete  Patient's presentation is most consistent with likely conjunctivitis of the right eye.  No findings concerning for foreign body, corneal abrasion, herpetic lesion, or other acute pathology.  Patient is improved following his ED evaluation.  He will be prescribed antibiotics and ophthalmic drops.  He understands need for close follow-up.  Strict return precautions given and understood.  Final Clinical Impressions(s) / ED  Diagnoses   Final diagnoses:  Acute conjunctivitis of right eye, unspecified acute conjunctivitis type    ED Discharge Orders         Ordered    gentamicin (GARAMYCIN) 0.3 % ophthalmic solution  Every 4 hours     01/28/18 1815           Valarie Merino, MD 01/28/18 [phone]

## 2018-01-28 NOTE — Discharge Instructions (Addendum)
Please return for any problem.  Follow-up with your regular care providers as instructed.  Symptoms fail to improve or worsen please follow-up with ophthalmology as instructed.

## 2018-03-06 MED FILL — !VENTOLIN HFA INHALER: 108 (90 BAS | 16 days supply | Qty: 18 | Fill #2

## 2018-03-06 MED FILL — SPIRIVA 18 MCG CP-HANDIHALE: 18 | 30 days supply | Qty: 30 | Fill #3

## 2018-03-06 MED FILL — AMLODIPINE BESYLATE 10 MG T: 10 | 30 days supply | Qty: 30 | Fill #2

## 2018-03-06 MED FILL — !ADVAIR 250/50 DISKUS: 250-50 | 30 days supply | Qty: 60 | Fill #2

## 2018-03-27 MED FILL — IPRAT-ALBUT 0.5-3(2.5) MG/3: 0.5-2.5 (3) | 30 days supply | Qty: 360 | Fill #3

## 2018-03-27 MED FILL — !VENTOLIN HFA INHALER: 108 (90 BAS | 16 days supply | Qty: 18 | Fill #3

## 2018-04-21 ENCOUNTER — Ambulatory Visit: Payer: Self-pay | Admitting: Family Medicine

## 2018-04-23 ENCOUNTER — Ambulatory Visit: Payer: Self-pay | Admitting: Family Medicine

## 2018-05-06 ENCOUNTER — Encounter: Payer: Self-pay | Admitting: Family Medicine

## 2018-05-06 ENCOUNTER — Other Ambulatory Visit: Payer: Self-pay

## 2018-05-06 ENCOUNTER — Ambulatory Visit: Payer: Self-pay | Attending: Family Medicine | Admitting: Family Medicine

## 2018-05-06 DIAGNOSIS — R7303 Prediabetes: Secondary | ICD-10-CM

## 2018-05-06 DIAGNOSIS — Z720 Tobacco use: Secondary | ICD-10-CM

## 2018-05-06 DIAGNOSIS — E7800 Pure hypercholesterolemia, unspecified: Secondary | ICD-10-CM

## 2018-05-06 DIAGNOSIS — I10 Essential (primary) hypertension: Secondary | ICD-10-CM

## 2018-05-06 DIAGNOSIS — E669 Obesity, unspecified: Secondary | ICD-10-CM

## 2018-05-06 DIAGNOSIS — Z79899 Other long term (current) drug therapy: Secondary | ICD-10-CM

## 2018-05-06 DIAGNOSIS — J449 Chronic obstructive pulmonary disease, unspecified: Secondary | ICD-10-CM

## 2018-05-06 MED ORDER — FLUTICASONE-SALMETEROL 250-50 MCG/DOSE IN AEPB: 1.0000 | INHALATION_SPRAY | Freq: Two times a day (BID) | RESPIRATORY_TRACT | 3 refills | Status: DC

## 2018-05-06 MED ORDER — TIOTROPIUM BROMIDE MONOHYDRATE 18 MCG IN CAPS: 18.0000 ug | ORAL_CAPSULE | Freq: Every day | RESPIRATORY_TRACT | 6 refills | Status: DC

## 2018-05-06 MED ORDER — PRAVASTATIN SODIUM 10 MG PO TABS: ORAL_TABLET | ORAL | 1 refills | Status: DC

## 2018-05-06 MED ORDER — ALBUTEROL SULFATE HFA 108 (90 BASE) MCG/ACT IN AERS: 2.0000 | INHALATION_SPRAY | RESPIRATORY_TRACT | 3 refills | Status: DC | PRN

## 2018-05-06 MED ORDER — IPRATROPIUM-ALBUTEROL 0.5-2.5 (3) MG/3ML IN SOLN: 3.0000 mL | Freq: Four times a day (QID) | RESPIRATORY_TRACT | 3 refills | Status: DC | PRN

## 2018-05-06 MED ORDER — AMLODIPINE BESYLATE 10 MG PO TABS: 10.0000 mg | ORAL_TABLET | Freq: Every day | ORAL | 1 refills | Status: DC

## 2018-05-06 MED FILL — IPRAT-ALBUT 0.5-3(2.5) MG/3: 0.5-2.5 (3) | 30 days supply | Qty: 360 | Fill #4

## 2018-05-06 MED FILL — $VENTOLIN HFA 18G INHALER: 108 (90 BAS | 24 days supply | Qty: 18 | Fill #1

## 2018-05-06 MED FILL — SPIRIVA 18 MCG CP-HANDIHALE: 18 | 30 days supply | Qty: 30 | Fill #0

## 2018-05-06 MED FILL — PRAVASTATIN SODIUM 10 MG TA: 10 | 30 days supply | Qty: 30 | Fill #0

## 2018-05-06 MED FILL — AMLODIPINE BESYLATE 10 MG T: 10 | 30 days supply | Qty: 30 | Fill #3

## 2018-05-06 MED FILL — !ADVAIR 250/50 DISKUS: 250-50 | 30 days supply | Qty: 60 | Fill #3

## 2018-05-06 NOTE — Progress Notes (Signed)
Virtual Visit via Telephone Note  I connected with [name] on 05/06/18 at  8:30 AM EDT by telephone and verified that I am speaking with the correct person using two identifiers.   I discussed the limitations, risks, security and privacy concerns of performing an evaluation and management service by telephone and the availability of in person appointments. I also discussed with the patient that there may be a patient responsible charge related to this service. The patient expressed understanding and agreed to proceed.  Due to restrictions and limitations placed on in-office visits due to the COVID-19 pandemic, today's visit was done via telephone/telehealth visit  Patient location: Home Provider location: Office Call initiated by Emilio Aspen, CMA  History of Present Illness:      63 yo male seen in follow-up of Hypertension, COPD, Hyperlipidemia and prediabetes. He feels that he is doing well at this time. He does continue to smoke and is not interested in quitting at this time. He feels that his COPD is stable. He does have an occasional non-productive morning cough which he attributes to smoking and has occasional shortness of breath with exertion. He is using his COPD medications. He denies any fever or chills and no increased congestion.       He continues to take his medications for his blood pressure and cholesterol. He denies any headaches or dizziness and his BP has been normal when he has checked it at a store or pharmacy. He denies any increase in muscle or joint pain with the use of his cholesterol medication. He has tried to make dietary changes regarding the prediabetes. He denies any increased thirst, no blurred vision and no urinary frequency.  Past Medical History:  Diagnosis Date  . Asthma   . COPD (chronic obstructive pulmonary disease) ([place])   . Hypertension    Past Surgical History:  Procedure Laterality Date  . HAND SURGERY     Family History  Problem  Relation Age of Onset  . Hypertension Paternal Uncle   . Cancer Maternal Grandmother   . Hypertension Maternal Grandfather    Social History   Tobacco Use  . Smoking status: Current Some Day Smoker    Packs/day: 1.00    Years: 45.00    Pack years: 45.00    Types: Cigarettes  . Smokeless tobacco: Never Used  Substance Use Topics  . Alcohol use: Yes    Comment: "every once in awhile"  . Drug use: No    Types: Marijuana    Comment: occasionally marijuana   Allergies  Allergen Reactions  . Codeine Itching  . Penicillins Itching and Other (See Comments)    Has patient had a PCN reaction causing immediate rash, facial/tongue/throat swelling, SOB or lightheadedness with hypotension: No Has patient had a PCN reaction causing severe rash involving mucus membranes or skin necrosis: No Has patient had a PCN reaction that required hospitalization No Has patient had a PCN reaction occurring within the last 10 years: No If all of the above answers are "NO", then may proceed with Cephalosporin use.   Review of Systems  Constitutional: Negative for chills and fever.  HENT: Negative for congestion and sore throat.   Eyes: Negative for blurred vision and double vision.  Respiratory: Positive for cough and shortness of breath. Negative for wheezing.   Cardiovascular: Negative for chest pain, palpitations and leg swelling.  Gastrointestinal: Positive for constipation (occasional). Negative for abdominal pain, blood in stool, diarrhea, melena, nausea and vomiting.  Genitourinary: Negative for  dysuria and frequency.  Musculoskeletal: Negative for back pain, falls, joint pain and myalgias.  Skin: Negative for itching and rash.  Neurological: Positive for headaches (occasional ). Negative for dizziness.  Endo/Heme/Allergies: Negative for polydipsia. Does not bruise/bleed easily.    Observations/Objective: No vital signs or physical exam as encounter was conducted via telephone  Assessment and  Plan: 1. Essential hypertension Patient reports that his blood pressure has been controlled. He will continue amlodipine 10 mg  Once daily and DASH diet advised. Continue to monitor blood pressure and call or return if BP is staying at 140/90 or greater. Return to office for BP check in 4-6 weeks  2. COPD mixed type [facility]) Patient continues to smoke and is not interested in smoking cessation at this time. Continue use of Advair, Spiriva, Duoneb as well as albuterol as needed  3. Prediabetes Last Hgb A1c was 5.8 in Dec of 2019 and low carb diet and exercise such as walking was recommended. Patient was asked to schedule lab visit for the end of May to have Hgb A1c and will be advised if other interventions such as metformin needed based on the results  4. Tobacco abuse Discussed the importance of smoking cessation but patient does not feel that he is ready to smoke smoking at this time but he was made aware that there are resources available when he is ready  5. Obesity (BMI 30-39.9) Patient encouraged to follow a low carbohydrate/low fat diet and try to incorporate regular exercise with the goal of weight loss  6. Elevated LDL cholesterol level Continued use of pravastatin encouraged as LDL 131 on most recent lipid panel in 2019 and would like to see patient's LDL below 100 and eventually at 70 or less especially if he develops diabetes  Follow Up Instructions:Return in about 4 months (around 09/05/2018) for chronic issues-labs and BP check in 4-6 weeks.    I discussed the assessment and treatment plan with the patient. The patient was provided an opportunity to ask questions and all were answered. The patient agreed with the plan and demonstrated an understanding of the instructions.   The patient was advised to call back or seek an in-person evaluation if the symptoms worsen or if the condition fails to improve as anticipated.  I provided 14 minutes of non-face-to-face time during this  encounter.   Antony Blackbird, MD

## 2018-05-06 NOTE — Progress Notes (Signed)
Med refill  Per pt he never got his Pravastatin

## 2018-06-12 MED FILL — $VENTOLIN HFA 18G INHALER: 108 (90 BAS | 16 days supply | Qty: 18 | Fill #4

## 2018-06-12 MED FILL — SPIRIVA 18 MCG CP-HANDIHALE: 18 | 30 days supply | Qty: 30 | Fill #1

## 2018-07-14 MED FILL — $VENTOLIN HFA 18G INHALER: 108 (90 BAS | 16 days supply | Qty: 18 | Fill #5

## 2018-07-14 MED FILL — IPRAT-ALBUT 0.5-3(2.5) MG/3: 0.5-2.5 (3) | 30 days supply | Qty: 360 | Fill #5

## 2018-07-14 MED FILL — SPIRIVA 18 MCG CP-HANDIHALE: 18 | 30 days supply | Qty: 30 | Fill #2

## 2018-07-14 MED FILL — $ADVAIR 250/50 MCG INHALER: 250-50 | 90 days supply | Qty: 180 | Fill #0

## 2018-09-09 MED FILL — !VENTOLIN HFA INHALER: 108 (90 BAS | 16 days supply | Qty: 18 | Fill #6

## 2018-10-06 IMAGING — DX DG CHEST 2V
2 series · 2 of 2 positions shown · non-contrast
Comparison: August 28, 2015

CLINICAL DATA: Cough and congestion for 4 days

EXAM:
CHEST  2 VIEW

[chest pa]
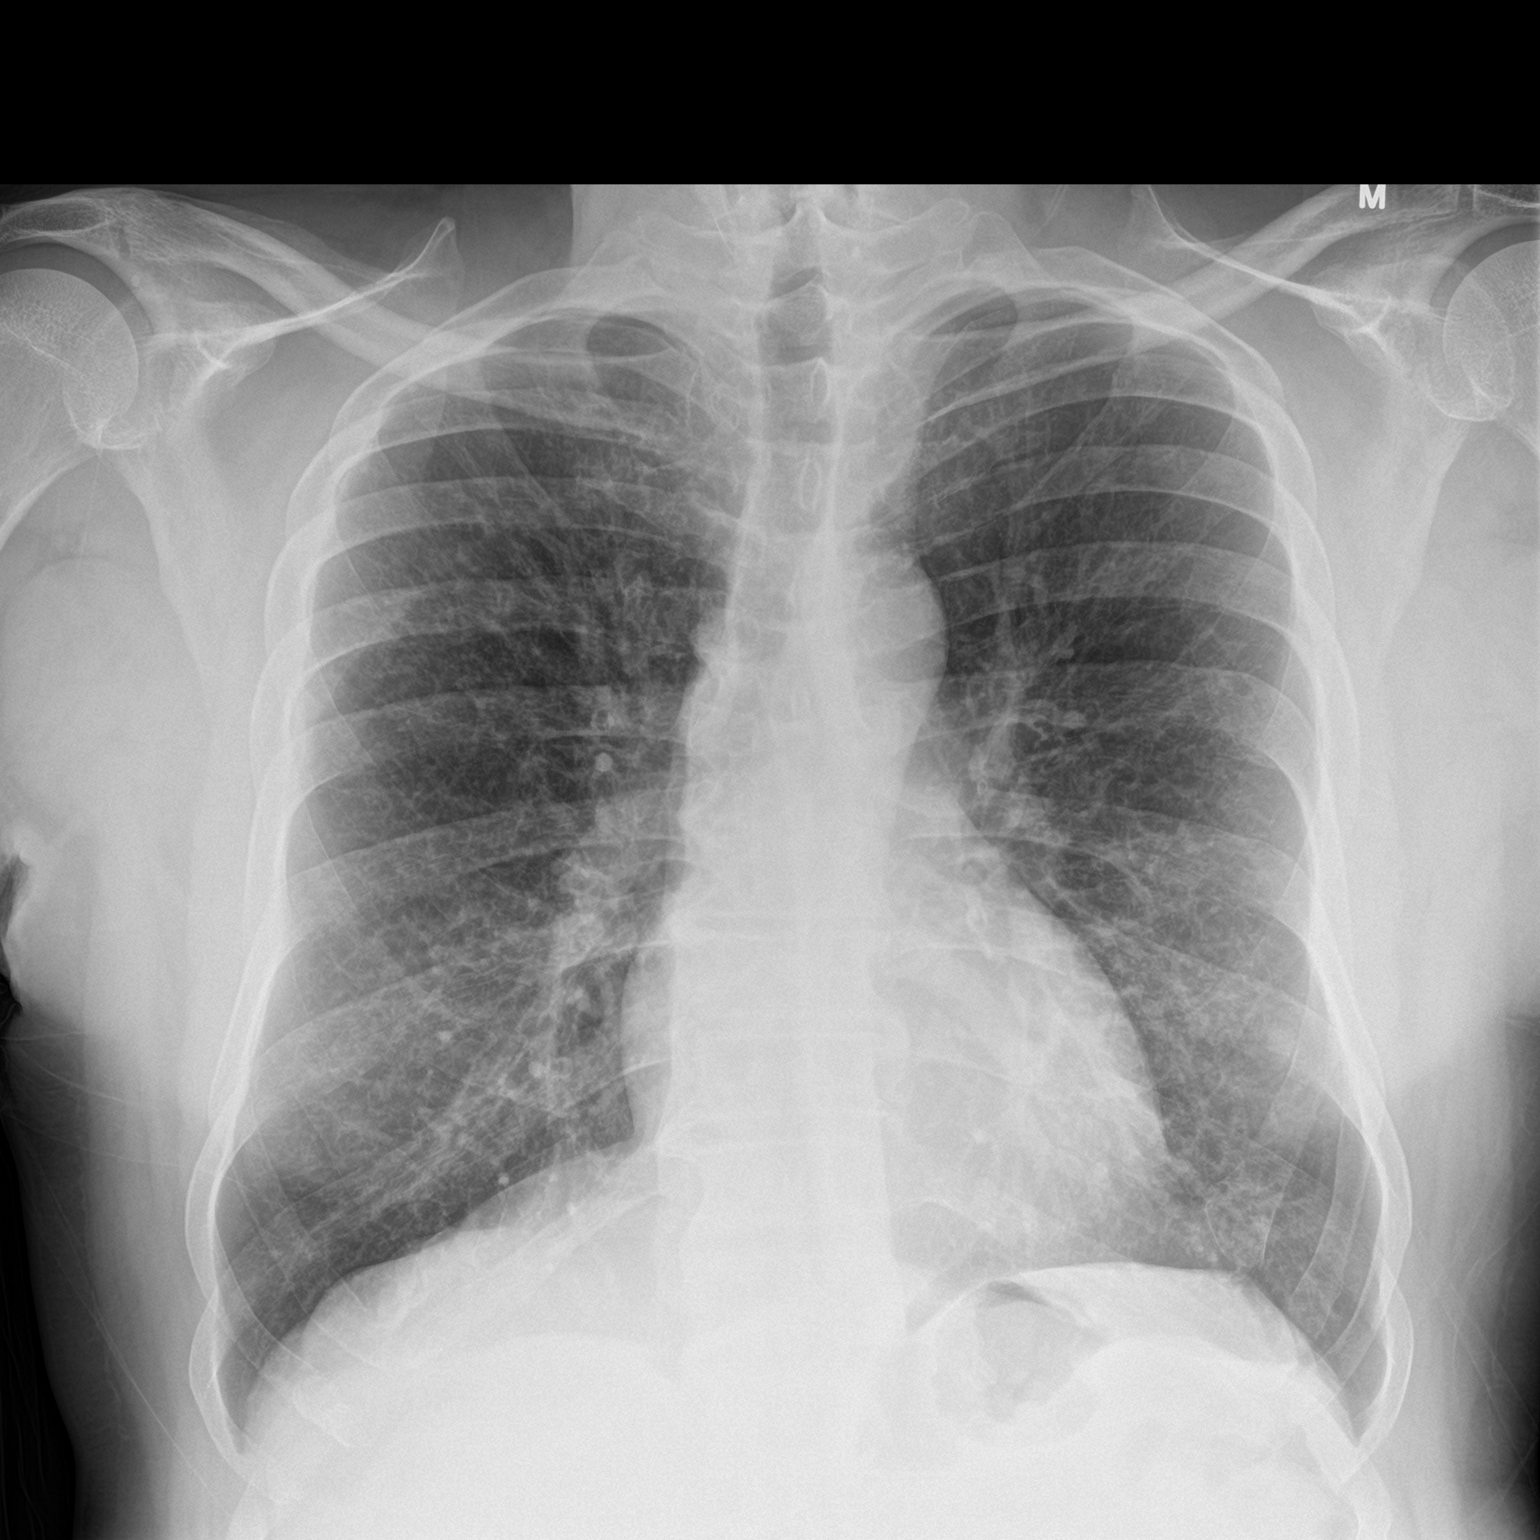

[chest lat]
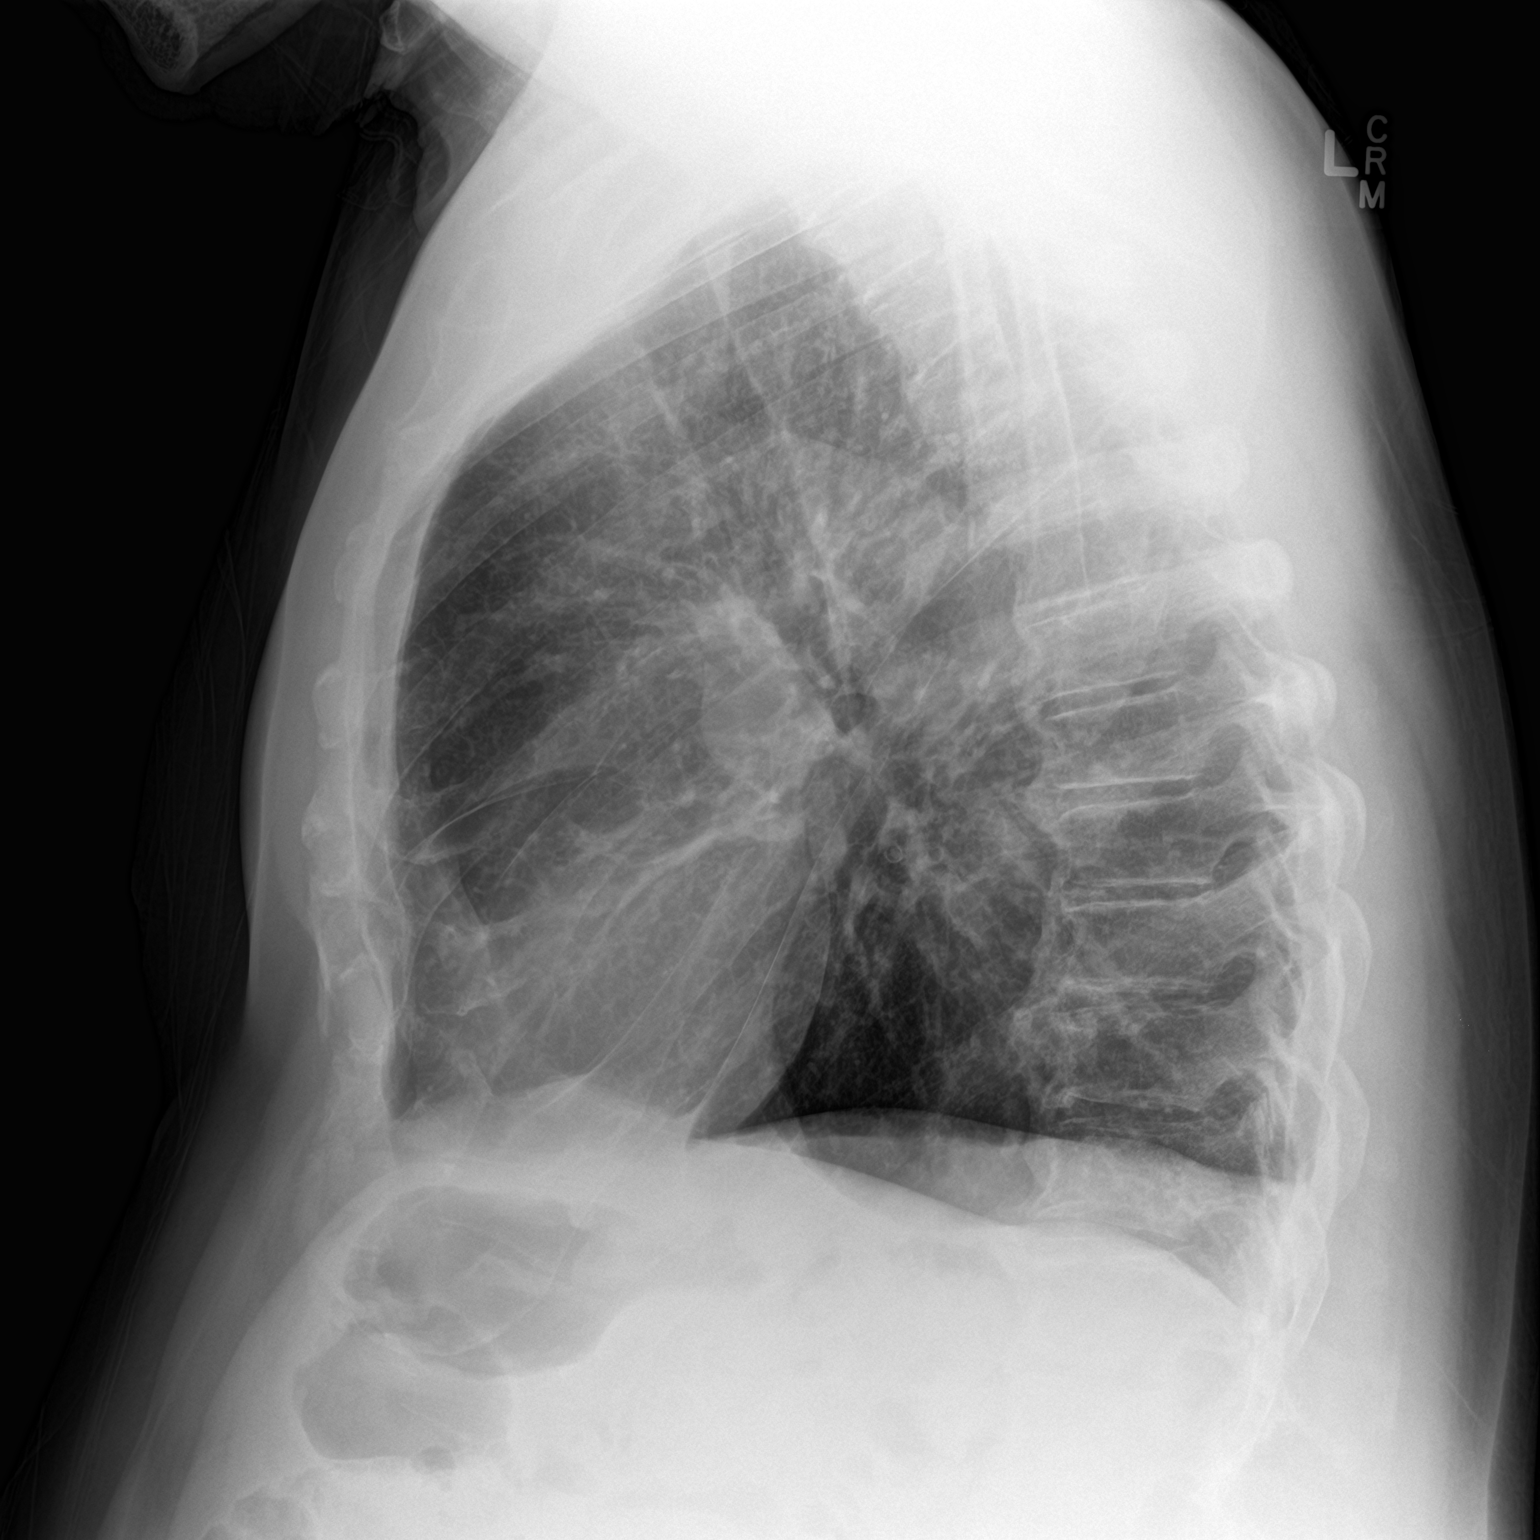

[2 of 2 positions shown; findings below may reference images not displayed]

FINDINGS: There is fairly diffuse interstitial thickening bilaterally. There
is no frank edema or consolidation. Heart size and pulmonary
vascularity are normal. No adenopathy. There is degenerative change
in the thoracic spine.
IMPRESSION: Somewhat diffuse interstitial thickening, probably representing a
degree of underlying chronic inflammatory type change/fibrosis. No
frank edema or consolidation. Cardiac silhouette within normal
limits.

## 2018-12-21 MED FILL — !VENTOLIN HFA INHALER: 108 (90 BAS | 16 days supply | Qty: 18 | Fill #0

## 2019-01-25 MED FILL — $VENTOLIN HFA 18G INHALER: 108 (90 BAS | 32 days supply | Qty: 36 | Fill #1

## 2019-03-23 MED FILL — $VENTOLIN HFA 18G INHALER: 108 (90 BAS | 16 days supply | Qty: 18 | Fill #2

## 2019-04-15 MED FILL — IPRAT-ALBUT 0.5-3(2.5) MG/3: 0.5-2.5 (3) | 30 days supply | Qty: 360 | Fill #0

## 2019-04-15 MED FILL — $ADVAIR 250/50 MCG INHALER: 250-50 | 90 days supply | Qty: 180 | Fill #1

## 2019-04-15 MED FILL — $VENTOLIN HFA 18G INHALER: 108 (90 BAS | 16 days supply | Qty: 18 | Fill #3

## 2019-05-04 MED FILL — $VENTOLIN HFA 18G INHALER: 108 (90 BAS | 16 days supply | Qty: 18 | Fill #4

## 2019-05-31 IMAGING — DX DG CHEST 2V
2 series · 2 of 2 positions shown · non-contrast
Comparison: Chest x-ray of March 26, 2016

CLINICAL DATA: Dyspnea and shortness of breath for the past 4 days
since running out is inhaler. Onset of chills 3 days ago. History of
asthma- COPD, community-acquired pneumonia, current smoker.

EXAM:
CHEST  2 VIEW

[chest pa]
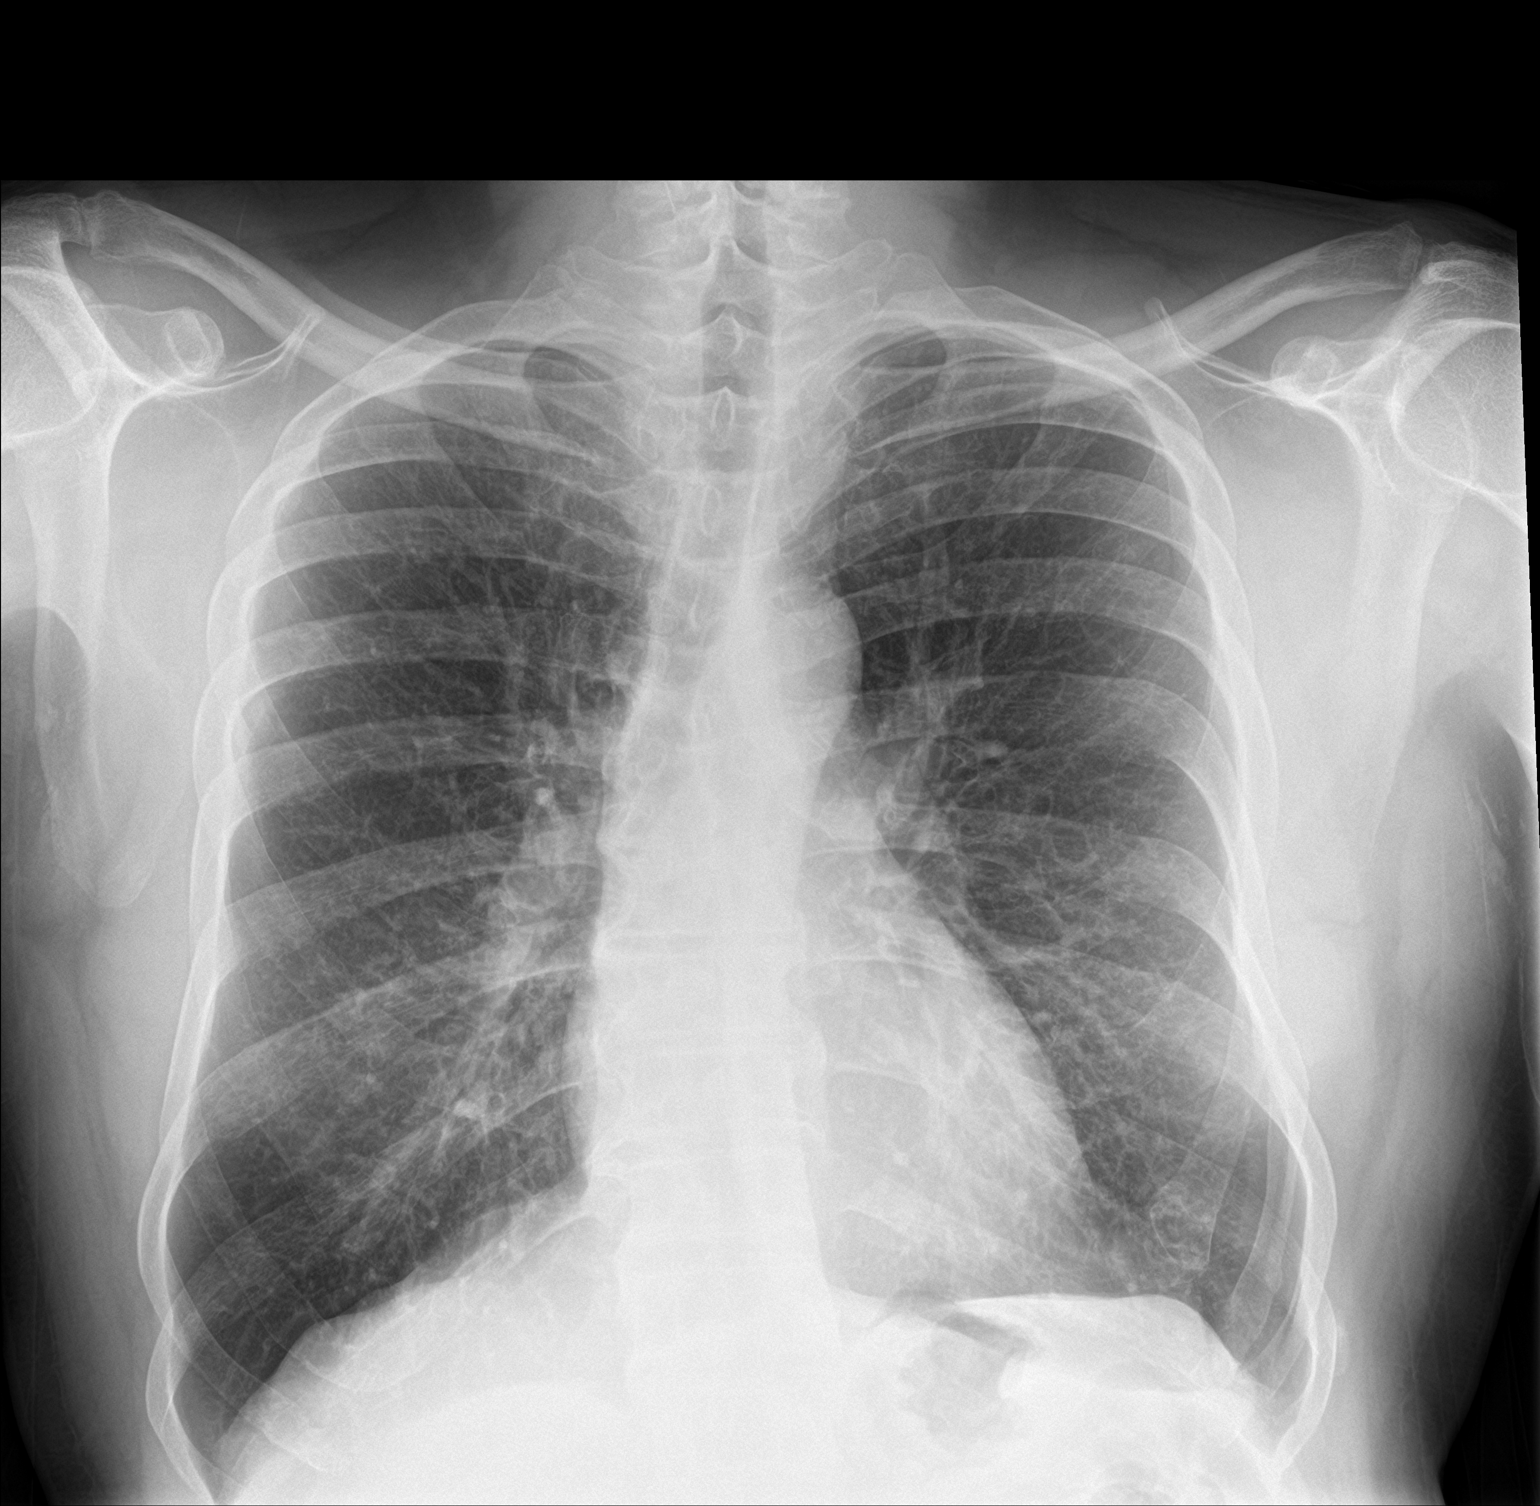

[chest lat]
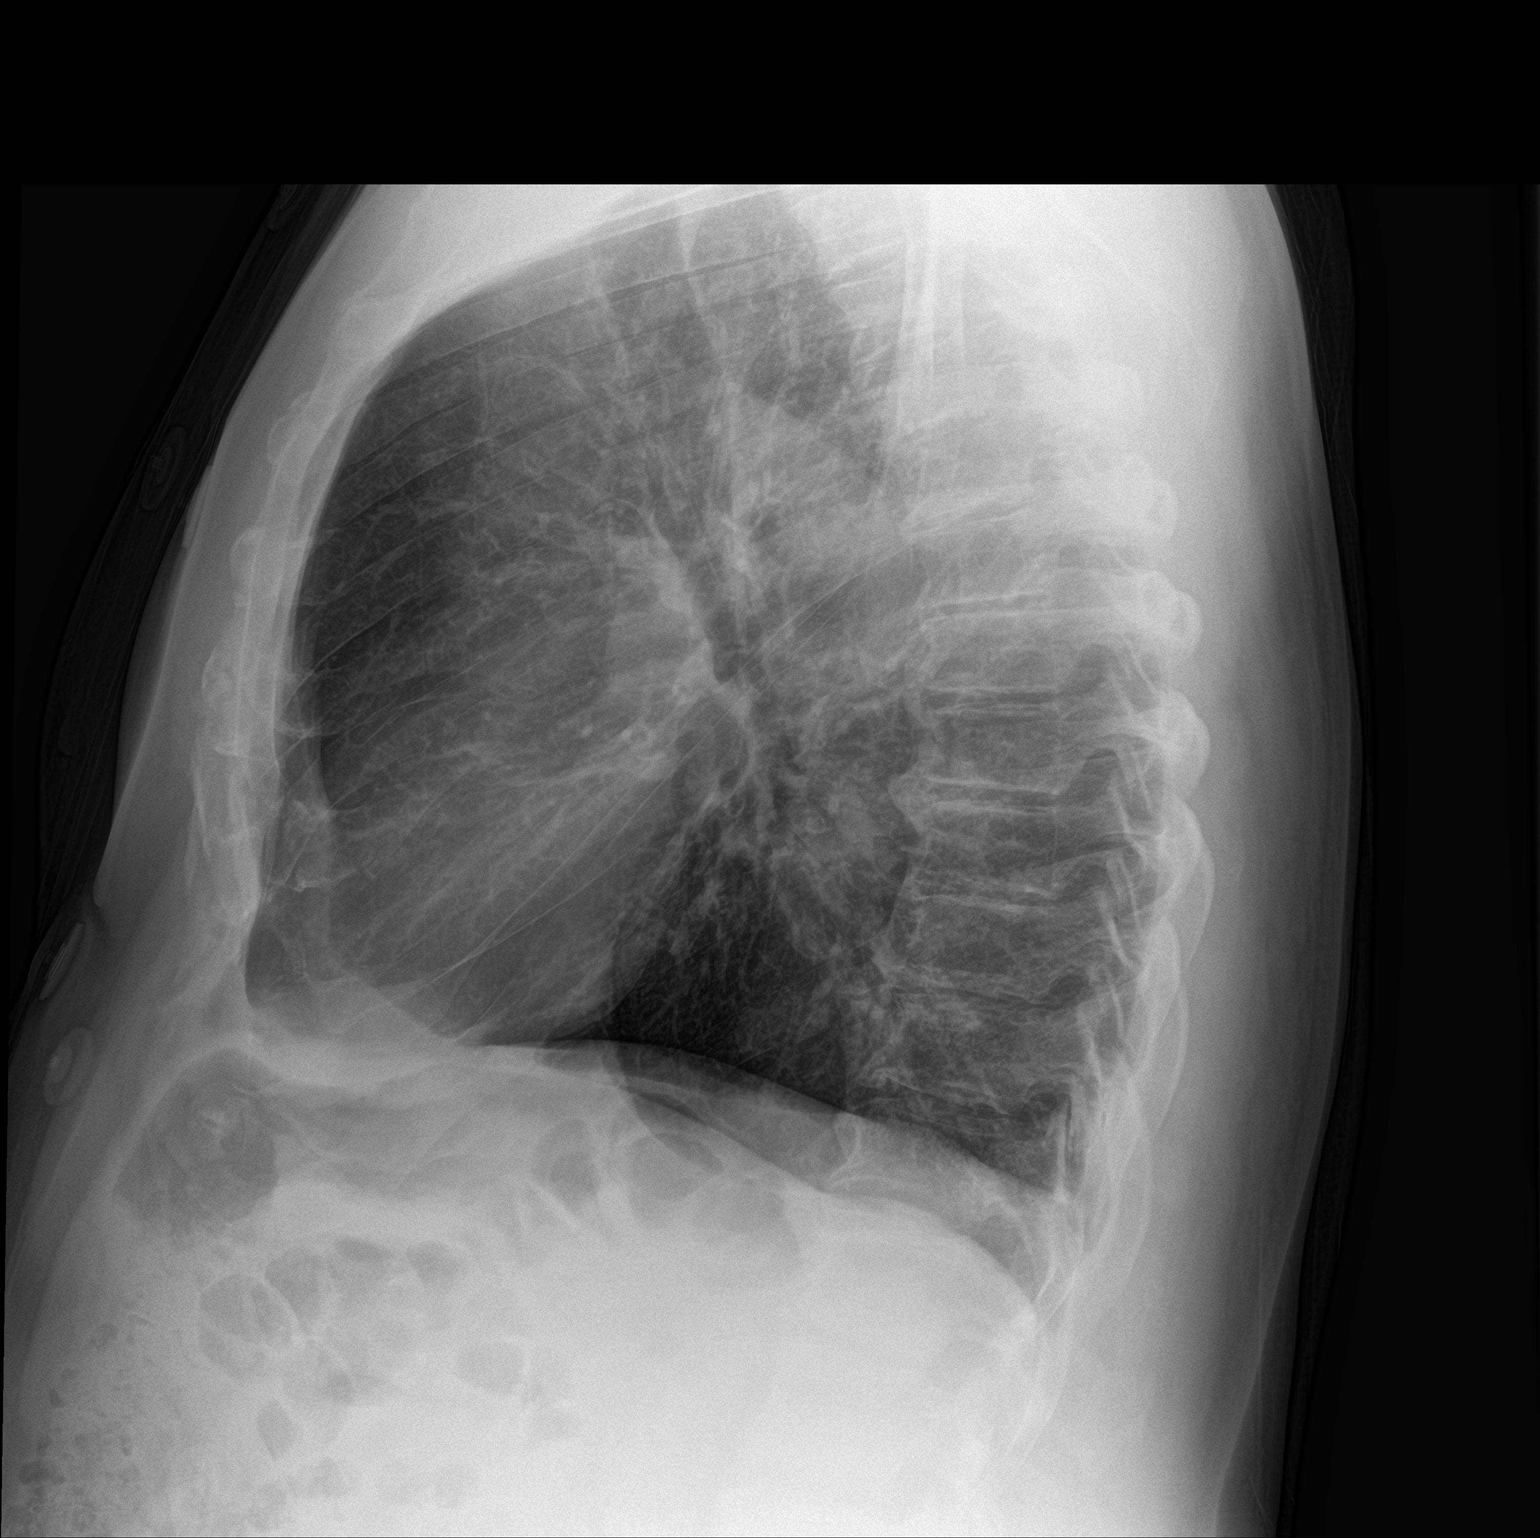

[2 of 2 positions shown; findings below may reference images not displayed]

FINDINGS: The lungs are mildly hyperinflated. The interstitial markings are
coarse but less conspicuous than on the previous study. The heart
and pulmonary vascularity are normal. The mediastinum is normal in
width. There is gentle dextrocurvature centered in the mid to lower
thoracic spine. There is multilevel degenerative disc disease of the
thoracic spine.
IMPRESSION: Chronic changes of asthma -COPD in the patient's smoking history. No
pneumonia, CHF, nor other acute cardiopulmonary abnormality.

## 2019-06-21 ENCOUNTER — Other Ambulatory Visit: Payer: Self-pay | Admitting: Family Medicine

## 2019-06-21 DIAGNOSIS — J449 Chronic obstructive pulmonary disease, unspecified: Secondary | ICD-10-CM

## 2019-06-23 ENCOUNTER — Telehealth: Payer: Self-pay

## 2019-06-23 DIAGNOSIS — J449 Chronic obstructive pulmonary disease, unspecified: Secondary | ICD-10-CM

## 2019-06-23 MED ORDER — FLUTICASONE-SALMETEROL 250-50 MCG/DOSE IN AEPB: 1.0000 | INHALATION_SPRAY | Freq: Two times a day (BID) | RESPIRATORY_TRACT | 0 refills | Status: DC

## 2019-06-23 MED ORDER — ALBUTEROL SULFATE HFA 108 (90 BASE) MCG/ACT IN AERS: 2.0000 | INHALATION_SPRAY | RESPIRATORY_TRACT | 0 refills | Status: DC | PRN

## 2019-06-23 MED ORDER — SPIRIVA HANDIHALER 18 MCG IN CAPS: 18.0000 ug | ORAL_CAPSULE | Freq: Every day | RESPIRATORY_TRACT | 0 refills | Status: DC

## 2019-06-23 NOTE — Telephone Encounter (Signed)
Patient is requesting refill on inhalers sent to [facility].   Patient has an appointment set for 07/08/2019 with Levada Dy.

## 2019-06-23 NOTE — Telephone Encounter (Signed)
Rx sent 

## 2019-07-08 ENCOUNTER — Ambulatory Visit: Payer: Self-pay | Attending: Family Medicine | Admitting: Physician Assistant

## 2019-07-08 ENCOUNTER — Other Ambulatory Visit: Payer: Self-pay

## 2019-07-08 ENCOUNTER — Other Ambulatory Visit: Payer: Self-pay | Admitting: Physician Assistant

## 2019-07-08 VITALS — BP 163/93 | HR 81 | Temp 97.7°F | Ht 69.0 in | Wt 245.0 lb

## 2019-07-08 DIAGNOSIS — D696 Thrombocytopenia, unspecified: Secondary | ICD-10-CM

## 2019-07-08 DIAGNOSIS — J449 Chronic obstructive pulmonary disease, unspecified: Secondary | ICD-10-CM

## 2019-07-08 DIAGNOSIS — Z9119 Patient's noncompliance with other medical treatment and regimen: Secondary | ICD-10-CM

## 2019-07-08 DIAGNOSIS — E785 Hyperlipidemia, unspecified: Secondary | ICD-10-CM

## 2019-07-08 DIAGNOSIS — Z91199 Patient's noncompliance with other medical treatment and regimen due to unspecified reason: Secondary | ICD-10-CM

## 2019-07-08 DIAGNOSIS — I10 Essential (primary) hypertension: Secondary | ICD-10-CM

## 2019-07-08 DIAGNOSIS — E7800 Pure hypercholesterolemia, unspecified: Secondary | ICD-10-CM

## 2019-07-08 DIAGNOSIS — R7303 Prediabetes: Secondary | ICD-10-CM

## 2019-07-08 LAB — POCT GLYCOSYLATED HEMOGLOBIN (HGB A1C): Hemoglobin A1C: 5.5 % (ref 4.0–5.6)

## 2019-07-08 LAB — GLUCOSE, POCT (MANUAL RESULT ENTRY): POC Glucose: 102 mg/dl — AB (ref 70–99)

## 2019-07-08 MED ORDER — IPRATROPIUM-ALBUTEROL 0.5-2.5 (3) MG/3ML IN SOLN: 3.0000 mL | Freq: Four times a day (QID) | RESPIRATORY_TRACT | 3 refills | Status: DC | PRN

## 2019-07-08 MED ORDER — FLUTICASONE-SALMETEROL 250-50 MCG/DOSE IN AEPB: 1.0000 | INHALATION_SPRAY | Freq: Two times a day (BID) | RESPIRATORY_TRACT | 3 refills | Status: DC

## 2019-07-08 MED ORDER — ALBUTEROL SULFATE HFA 108 (90 BASE) MCG/ACT IN AERS: 2.0000 | INHALATION_SPRAY | RESPIRATORY_TRACT | 1 refills | Status: DC | PRN

## 2019-07-08 MED ORDER — SPIRIVA HANDIHALER 18 MCG IN CAPS: 18.0000 ug | ORAL_CAPSULE | Freq: Every day | RESPIRATORY_TRACT | 3 refills | Status: DC

## 2019-07-08 MED ORDER — PRAVASTATIN SODIUM 10 MG PO TABS: ORAL_TABLET | ORAL | 1 refills | Status: DC

## 2019-07-08 MED ORDER — AMLODIPINE BESYLATE 10 MG PO TABS: 10.0000 mg | ORAL_TABLET | Freq: Every day | ORAL | 1 refills | Status: DC

## 2019-07-08 MED ORDER — MONTELUKAST SODIUM 10 MG PO TABS: 10.0000 mg | ORAL_TABLET | Freq: Every day | ORAL | 3 refills | Status: DC

## 2019-07-08 MED FILL — IPRAT-ALBUT 0.5-3(2.5) MG/3: 0.5-2.5 (3) | 30 days supply | Qty: 360 | Fill #0

## 2019-07-08 MED FILL — PRAVASTATIN SODIUM 10 MG TA: 10 | 30 days supply | Qty: 30 | Fill #0

## 2019-07-08 MED FILL — MONTELUKAST SOD 10 MG TAB: 10 | 30 days supply | Qty: 30 | Fill #0

## 2019-07-08 MED FILL — $VENTOLIN HFA 18G INHALER: 108 (90 BAS | 16 days supply | Qty: 18 | Fill #0

## 2019-07-08 MED FILL — AMLODIPINE BESYLATE 10 MG T: 10 | 30 days supply | Qty: 30 | Fill #0

## 2019-07-08 NOTE — Progress Notes (Signed)
[name], is a 64 y.o. male  [id number]:[id number]  ESP:[id number]  DOB - Jan 13, 1956  Subjective:  Chief Complaint and HPI: [name] is a 64 y.o. male here today for med RF.    Htn:  Never took meds as prescribed.  Never took the amlodipine at all.  No HA/CP/dizziness.  Wheezing:  Uses breathing treatments about 4 times daily and rescue inhaler.  Only taking advair once daily despite it being prescribed twice daily.  Takes spiriva as prescribed.  Still smoking some.  Never took cholesterol meds.  Does not eat low cholesterol diet  ROS:   Constitutional:  No f/c, No night sweats, No unexplained weight loss. EENT:  No vision changes, No blurry vision, No hearing changes. No mouth, throat, or ear problems.  Respiratory: No cough, + SOB/wheezing Cardiac: No CP, no palpitations GI:  No abd pain, No N/V/D. GU: No Urinary s/sx Musculoskeletal: No joint pain Neuro: No headache, no dizziness, no motor weakness.  Skin: No rash Endocrine:  No polydipsia. No polyuria.  Psych: Denies SI/HI  No problems updated.  ALLERGIES: Allergies  Allergen Reactions  . Codeine Itching  . Penicillins Itching and Other (See Comments)    Has patient had a PCN reaction causing immediate rash, facial/tongue/throat swelling, SOB or lightheadedness with hypotension: No Has patient had a PCN reaction causing severe rash involving mucus membranes or skin necrosis: No Has patient had a PCN reaction that required hospitalization No Has patient had a PCN reaction occurring within the last 10 years: No If all of the above answers are "NO", then may proceed with Cephalosporin use.    PAST MEDICAL HISTORY: Past Medical History:  Diagnosis Date  . Asthma   . COPD (chronic obstructive pulmonary disease) ([place])   . Hypertension     MEDICATIONS AT HOME: Prior to Admission medications   Medication Sig Start Date End Date Taking? Authorizing Provider  albuterol (VENTOLIN HFA) 108 (90 Base) MCG/ACT inhaler  Inhale 2 puffs into the lungs every 4 (four) hours as needed for wheezing or shortness of breath. 07/08/19  Yes Michial Disney M, PA-C  amLODipine (NORVASC) 10 MG tablet Take 1 tablet (10 mg total) by mouth daily. To lower blood pressure 07/08/19  Yes Nyasiah Moffet M, PA-C  Fluticasone-Salmeterol (ADVAIR DISKUS) 250-50 MCG/DOSE AEPB Inhale 1 puff into the lungs 2 (two) times daily. 07/08/19 08/07/19 Yes Wana Mount, Dionne Bucy, PA-C  ipratropium-albuterol (DUONEB) 0.5-2.5 (3) MG/3ML SOLN Take 3 mLs by nebulization every 6 (six) hours as needed (severe wheezing). 07/08/19  Yes Freeman Caldron M, PA-C  pravastatin (PRAVACHOL) 10 MG tablet One pill each evening to help lower cholesterol 07/08/19  Yes Larina Lieurance M, PA-C  tiotropium (SPIRIVA HANDIHALER) 18 MCG inhalation capsule Place 1 capsule (18 mcg total) into inhaler and inhale daily. 07/08/19 08/07/19 Yes Anesia Blackwell, Dionne Bucy, PA-C  aspirin 81 MG EC tablet Take 1 tablet (81 mg total) daily by mouth. Patient not taking: Reported on 06/11/2017 11/27/16   Regalado, Jerald Kief A, MD  montelukast (SINGULAIR) 10 MG tablet Take 1 tablet (10 mg total) by mouth at bedtime. 07/08/19   Argentina Donovan, PA-C     Objective:  EXAM:   Vitals:   07/08/19 0843  BP: (!) 163/93  Pulse: 81  Temp: 97.7 F (36.5 C)  TempSrc: Temporal  SpO2: 97%  Weight: 245 lb (111.1 kg)  Height: 5\' 9"  (1.753 m)    General appearance : A&OX3. NAD. Non-toxic-appearing HEENT: Atraumatic and Normocephalic.  PERRLA. EOM intact.  Chest/Lungs:  Breathing-non-labored, fair air entry bilaterally, breath sounds without rales or rhonchi but there is diffuse wheezing throughout wheezing  CVS: S1 S2 regular, no murmurs, gallops, rubs  Extremities: Bilateral Lower Ext shows no edema, both legs are warm to touch with = pulse throughout Neurology:  CN II-XII grossly intact, Non focal.   Psych:  TP linear. J/I WNL. Normal speech. Appropriate eye contact and affect.  Skin:  No Rash  Data  Review Lab Results  Component Value Date   HGBA1C 5.5 07/08/2019   HGBA1C 5.8 (H) 12/18/2017   HGBA1C 5.7 07/24/2016     Assessment & Plan   1. Prediabetes I have had a lengthy discussion and provided education about insulin resistance and the intake of too much sugar/refined carbohydrates.  I have advised the patient to work at a goal of eliminating sugary drinks, candy, desserts, sweets, refined sugars, processed foods, and white carbohydrates.  The patient expresses understanding.  - Glucose (CBG) - HgB A1c  2. COPD mixed type ([place]) Use advair as directed(twice daily instead of once and add singulair) - albuterol (VENTOLIN HFA) 108 (90 Base) MCG/ACT inhaler; Inhale 2 puffs into the lungs every 4 (four) hours as needed for wheezing or shortness of breath.  Dispense: 18 g; Refill: 1 - tiotropium (SPIRIVA HANDIHALER) 18 MCG inhalation capsule; Place 1 capsule (18 mcg total) into inhaler and inhale daily.  Dispense: 30 capsule; Refill: 3 - ipratropium-albuterol (DUONEB) 0.5-2.5 (3) MG/3ML SOLN; Take 3 mLs by nebulization every 6 (six) hours as needed (severe wheezing).  Dispense: 360 mL; Refill: 3 - montelukast (SINGULAIR) 10 MG tablet; Take 1 tablet (10 mg total) by mouth at bedtime.  Dispense: 30 tablet; Refill: 3 -SMOKING CESSATION IMPERATIVE counseled  3. Essential hypertension Not controlled.  We have discussed target BP range and blood pressure goal. I have advised patient to check BP regularly and to call us back or report to clinic if the numbers are consistently higher than 140/90. We discussed the importance of compliance with medical therapy and DASH diet recommended, consequences of uncontrolled hypertension discussed.  Compliance imperative.  Check BP 3-5 times weekly and record.  - amLODipine (NORVASC) 10 MG tablet; Take 1 tablet (10 mg total) by mouth daily. To lower blood pressure  Dispense: 90 tablet; Refill: 1 - Comprehensive metabolic panel - CBC with  Differential/Platelet  4. Chronic obstructive pulmonary disease, unspecified COPD type (HCC) - Fluticasone-Salmeterol (ADVAIR DISKUS) 250-50 MCG/DOSE AEPB; Inhale 1 puff into the lungs 2 (two) times daily.  Dispense: 60 each; Refill: 3  5. Elevated LDL cholesterol level Non-compliance.  Patient education and verbalized understanding - pravastatin (PRAVACHOL) 10 MG tablet; One pill each evening to help lower cholesterol  Dispense: 90 tablet; Refill: 1 - Lipid panel  6. Thrombocytopenia (HCC) Check CBC  7. Hyperlipidemia, unspecified hyperlipidemia type See #5 - pravastatin (PRAVACHOL) 10 MG tablet; One pill each evening to help lower cholesterol  Dispense: 90 tablet; Refill: 1 - Lipid panel  8.  Noncompliance Compliance imperative.  Spent 40 mins face to face counseling on all the above.     Patient have been counseled extensively about nutrition and exercise  Return in about 3 months (around 10/08/2019) for PCP;  chronic conditions.  The patient was given clear instructions to go to ER or return to medical center if symptoms don't improve, worsen or new problems develop. The patient verbalized understanding. The patient was told to call to get lab results if they haven't heard anything in the next week.  Freeman Caldron, PA-C [facility] and [place], [place]   07/08/2019, 9:06 AMPatient ID: Thaddeus Evitts, male   DOB: 13-Oct-1955, 64 y.o.   MRN: [id number]

## 2019-07-08 NOTE — Patient Instructions (Signed)
Take all meds as prescribed.  Check your blood pressure 3-5 times weekly and record.     Hypertension, Adult High blood pressure (hypertension) is when the force of blood pumping through the arteries is too strong. The arteries are the blood vessels that carry blood from the heart throughout the body. Hypertension forces the heart to work harder to pump blood and may cause arteries to become narrow or stiff. Untreated or uncontrolled hypertension can cause a heart attack, heart failure, a stroke, kidney disease, and other problems. A blood pressure reading consists of a higher number over a lower number. Ideally, your blood pressure should be below 120/80. The first ("top") number is called the systolic pressure. It is a measure of the pressure in your arteries as your heart beats. The second ("bottom") number is called the diastolic pressure. It is a measure of the pressure in your arteries as the heart relaxes. What are the causes? The exact cause of this condition is not known. There are some conditions that result in or are related to high blood pressure. What increases the risk? Some risk factors for high blood pressure are under your control. The following factors may make you more likely to develop this condition:  Smoking.  Having type 2 diabetes mellitus, high cholesterol, or both.  Not getting enough exercise or physical activity.  Being overweight.  Having too much fat, sugar, calories, or salt (sodium) in your diet.  Drinking too much alcohol. Some risk factors for high blood pressure may be difficult or impossible to change. Some of these factors include:  Having chronic kidney disease.  Having a family history of high blood pressure.  Age. Risk increases with age.  Race. You may be at higher risk if you are African American.  Gender. Men are at higher risk than women before age 57. After age 26, women are at higher risk than men.  Having obstructive sleep  apnea.  Stress. What are the signs or symptoms? High blood pressure may not cause symptoms. Very high blood pressure (hypertensive crisis) may cause:  Headache.  Anxiety.  Shortness of breath.  Nosebleed.  Nausea and vomiting.  Vision changes.  Severe chest pain.  Seizures. How is this diagnosed? This condition is diagnosed by measuring your blood pressure while you are seated, with your arm resting on a flat surface, your legs uncrossed, and your feet flat on the floor. The cuff of the blood pressure monitor will be placed directly against the skin of your upper arm at the level of your heart. It should be measured at least twice using the same arm. Certain conditions can cause a difference in blood pressure between your right and left arms. Certain factors can cause blood pressure readings to be lower or higher than normal for a short period of time:  When your blood pressure is higher when you are in a health care provider's office than when you are at home, this is called white coat hypertension. Most people with this condition do not need medicines.  When your blood pressure is higher at home than when you are in a health care provider's office, this is called masked hypertension. Most people with this condition may need medicines to control blood pressure. If you have a high blood pressure reading during one visit or you have normal blood pressure with other risk factors, you may be asked to:  Return on a different day to have your blood pressure checked again.  Monitor your blood pressure  at home for 1 week or longer. If you are diagnosed with hypertension, you may have other blood or imaging tests to help your health care provider understand your overall risk for other conditions. How is this treated? This condition is treated by making healthy lifestyle changes, such as eating healthy foods, exercising more, and reducing your alcohol intake. Your health care provider may  prescribe medicine if lifestyle changes are not enough to get your blood pressure under control, and if:  Your systolic blood pressure is above 130.  Your diastolic blood pressure is above 80. Your personal target blood pressure may vary depending on your medical conditions, your age, and other factors. Follow these instructions at home: Eating and drinking   Eat a diet that is high in fiber and potassium, and low in sodium, added sugar, and fat. An example eating plan is called the DASH (Dietary Approaches to Stop Hypertension) diet. To eat this way: ? Eat plenty of fresh fruits and vegetables. Try to fill one half of your plate at each meal with fruits and vegetables. ? Eat whole grains, such as whole-wheat pasta, brown rice, or whole-grain bread. Fill about one fourth of your plate with whole grains. ? Eat or drink low-fat dairy products, such as skim milk or low-fat yogurt. ? Avoid fatty cuts of meat, processed or cured meats, and poultry with skin. Fill about one fourth of your plate with lean proteins, such as fish, chicken without skin, beans, eggs, or tofu. ? Avoid pre-made and processed foods. These tend to be higher in sodium, added sugar, and fat.  Reduce your daily sodium intake. Most people with hypertension should eat less than 1,500 mg of sodium a day.  Do not drink alcohol if: ? Your health care provider tells you not to drink. ? You are pregnant, may be pregnant, or are planning to become pregnant.  If you drink alcohol: ? Limit how much you use to:  0-1 drink a day for women.  0-2 drinks a day for men. ? Be aware of how much alcohol is in your drink. In the [place] one drink equals one 12 oz bottle of beer (355 mL), one 5 oz glass of wine (148 mL), or one 1 oz glass of hard liquor (44 mL). Lifestyle   Work with your health care provider to maintain a healthy body weight or to lose weight. Ask what an ideal weight is for you.  Get at least 30 minutes of exercise  most days of the week. Activities may include walking, swimming, or biking.  Include exercise to strengthen your muscles (resistance exercise), such as Pilates or lifting weights, as part of your weekly exercise routine. Try to do these types of exercises for 30 minutes at least 3 days a week.  Do not use any products that contain nicotine or tobacco, such as cigarettes, e-cigarettes, and chewing tobacco. If you need help quitting, ask your health care provider.  Monitor your blood pressure at home as told by your health care provider.  Keep all follow-up visits as told by your health care provider. This is important. Medicines  Take over-the-counter and prescription medicines only as told by your health care provider. Follow directions carefully. Blood pressure medicines must be taken as prescribed.  Do not skip doses of blood pressure medicine. Doing this puts you at risk for problems and can make the medicine less effective.  Ask your health care provider about side effects or reactions to medicines that you should watch  for. Contact a health care provider if you:  Think you are having a reaction to a medicine you are taking.  Have headaches that keep coming back (recurring).  Feel dizzy.  Have swelling in your ankles.  Have trouble with your vision. Get help right away if you:  Develop a severe headache or confusion.  Have unusual weakness or numbness.  Feel faint.  Have severe pain in your chest or abdomen.  Vomit repeatedly.  Have trouble breathing. Summary  Hypertension is when the force of blood pumping through your arteries is too strong. If this condition is not controlled, it may put you at risk for serious complications.  Your personal target blood pressure may vary depending on your medical conditions, your age, and other factors. For most people, a normal blood pressure is less than 120/80.  Hypertension is treated with lifestyle changes, medicines, or a  combination of both. Lifestyle changes include losing weight, eating a healthy, low-sodium diet, exercising more, and limiting alcohol. This information is not intended to replace advice given to you by your health care provider. Make sure you discuss any questions you have with your health care provider. Document Revised: 09/10/2017 Document Reviewed: 09/10/2017 Elsevier Patient Education  2020 [employer].

## 2019-07-09 LAB — COMPREHENSIVE METABOLIC PANEL
ALT: 13 IU/L (ref 0–44)
AST: 16 IU/L (ref 0–40)
Albumin/Globulin Ratio: 2 (ref 1.2–2.2)
Albumin: 4.3 g/dL (ref 3.8–4.8)
Alkaline Phosphatase: 106 IU/L (ref 48–121)
BUN/Creatinine Ratio: 9 — ABNORMAL LOW (ref 10–24)
BUN: 8 mg/dL (ref 8–27)
Bilirubin Total: 0.5 mg/dL (ref 0.0–1.2)
CO2: 28 mmol/L (ref 20–29)
Calcium: 9.1 mg/dL (ref 8.6–10.2)
Chloride: 101 mmol/L (ref 96–106)
Creatinine, Ser: 0.94 mg/dL (ref 0.76–1.27)
GFR calc Af Amer: 100 mL/min/{1.73_m2} (ref >59)
GFR calc non Af Amer: 87 mL/min/{1.73_m2} (ref >59)
Globulin, Total: 2.2 g/dL (ref 1.5–4.5)
Glucose: 94 mg/dL (ref 65–99)
Potassium: 4.1 mmol/L (ref 3.5–5.2)
Sodium: 140 mmol/L (ref 134–144)
Total Protein: 6.5 g/dL (ref 6.0–8.5)

## 2019-07-09 LAB — CBC WITH DIFFERENTIAL/PLATELET
Basophils Absolute: 0 10*3/uL (ref 0.0–0.2)
Basos: 1 %
EOS (ABSOLUTE): 0.2 10*3/uL (ref 0.0–0.4)
Eos: 4 %
Hematocrit: 44 % (ref 37.5–51.0)
Hemoglobin: 14.6 g/dL (ref 13.0–17.7)
Immature Grans (Abs): 0 10*3/uL (ref 0.0–0.1)
Immature Granulocytes: 0 %
Lymphocytes Absolute: 1.7 10*3/uL (ref 0.7–3.1)
Lymphs: 37 %
MCH: 31.3 pg (ref 26.6–33.0)
MCHC: 33.2 g/dL (ref 31.5–35.7)
MCV: 94 fL (ref 79–97)
Monocytes Absolute: 0.5 10*3/uL (ref 0.1–0.9)
Monocytes: 12 %
Neutrophils Absolute: 2.1 10*3/uL (ref 1.4–7.0)
Neutrophils: 46 %
Platelets: 169 10*3/uL (ref 150–450)
RBC: 4.67 x10E6/uL (ref 4.14–5.80)
RDW: 13.4 % (ref 11.6–15.4)
WBC: 4.5 10*3/uL (ref 3.4–10.8)

## 2019-07-09 LAB — LIPID PANEL
Chol/HDL Ratio: 4.2 ratio (ref 0.0–5.0)
Cholesterol, Total: 194 mg/dL (ref 100–199)
HDL: 46 mg/dL (ref >39)
LDL Chol Calc (NIH): 134 mg/dL — ABNORMAL HIGH (ref 0–99)
Triglycerides: 76 mg/dL (ref 0–149)
VLDL Cholesterol Cal: 14 mg/dL (ref 5–40)

## 2019-09-30 MED FILL — !VENTOLIN HFA INHALER: 108 (90 BAS | 16 days supply | Qty: 18 | Fill #0

## 2019-09-30 MED FILL — FLUTICASONE-SALMETEROL 250-: 250-50 | 30 days supply | Qty: 60 | Fill #0

## 2019-09-30 MED FILL — SPIRIVA 18 MCG CP-HANDIHALE: 18 | 30 days supply | Qty: 30 | Fill #0

## 2019-10-08 ENCOUNTER — Other Ambulatory Visit: Payer: Self-pay

## 2019-10-08 ENCOUNTER — Other Ambulatory Visit: Payer: Self-pay | Admitting: Family Medicine

## 2019-10-08 ENCOUNTER — Ambulatory Visit: Payer: Self-pay | Attending: Family Medicine | Admitting: Family Medicine

## 2019-10-08 ENCOUNTER — Encounter: Payer: Self-pay | Admitting: Family Medicine

## 2019-10-08 VITALS — BP 140/80 | HR 100 | Ht 69.0 in | Wt 240.6 lb

## 2019-10-08 DIAGNOSIS — Z87898 Personal history of other specified conditions: Secondary | ICD-10-CM

## 2019-10-08 DIAGNOSIS — E669 Obesity, unspecified: Secondary | ICD-10-CM

## 2019-10-08 DIAGNOSIS — Z1211 Encounter for screening for malignant neoplasm of colon: Secondary | ICD-10-CM

## 2019-10-08 DIAGNOSIS — E785 Hyperlipidemia, unspecified: Secondary | ICD-10-CM

## 2019-10-08 DIAGNOSIS — F17200 Nicotine dependence, unspecified, uncomplicated: Secondary | ICD-10-CM

## 2019-10-08 DIAGNOSIS — I10 Essential (primary) hypertension: Secondary | ICD-10-CM

## 2019-10-08 DIAGNOSIS — J449 Chronic obstructive pulmonary disease, unspecified: Secondary | ICD-10-CM

## 2019-10-08 DIAGNOSIS — Z7189 Other specified counseling: Secondary | ICD-10-CM

## 2019-10-08 DIAGNOSIS — E7800 Pure hypercholesterolemia, unspecified: Secondary | ICD-10-CM

## 2019-10-08 MED ORDER — SPIRIVA HANDIHALER 18 MCG IN CAPS: 18.0000 ug | ORAL_CAPSULE | Freq: Every day | RESPIRATORY_TRACT | 3 refills | Status: DC

## 2019-10-08 MED ORDER — FLUTICASONE-SALMETEROL 250-50 MCG/DOSE IN AEPB: 1.0000 | INHALATION_SPRAY | Freq: Two times a day (BID) | RESPIRATORY_TRACT | 3 refills | Status: DC

## 2019-10-08 MED ORDER — PRAVASTATIN SODIUM 10 MG PO TABS: ORAL_TABLET | ORAL | 1 refills | Status: DC

## 2019-10-08 MED ORDER — ALBUTEROL SULFATE HFA 108 (90 BASE) MCG/ACT IN AERS: 2.0000 | INHALATION_SPRAY | RESPIRATORY_TRACT | 1 refills | Status: DC | PRN

## 2019-10-08 MED ORDER — MONTELUKAST SODIUM 10 MG PO TABS: 10.0000 mg | ORAL_TABLET | Freq: Every day | ORAL | 3 refills | Status: DC

## 2019-10-08 MED ORDER — AMLODIPINE BESYLATE 10 MG PO TABS: 10.0000 mg | ORAL_TABLET | Freq: Every day | ORAL | 1 refills | Status: DC

## 2019-10-08 MED FILL — MONTELUKAST SOD 10 MG TAB: 10 | 30 days supply | Qty: 30 | Fill #0

## 2019-10-08 MED FILL — FLUTICASONE-SALMETEROL 250-: 250-50 | 30 days supply | Qty: 60 | Fill #0

## 2019-10-08 MED FILL — ALBUTEROL SULFATE HFA 108 (: 108 (90 BAS | 25 days supply | Qty: 18 | Fill #0

## 2019-10-08 MED FILL — AMLODIPINE BESYLATE 10 MG T: 10 | 30 days supply | Qty: 30 | Fill #0

## 2019-10-08 MED FILL — PRAVASTATIN NA 10 MG TAB: 10 | 30 days supply | Qty: 30 | Fill #0

## 2019-10-08 NOTE — Patient Instructions (Signed)
Coping with Quitting Smoking  Quitting smoking is a physical and mental challenge. You will face cravings, withdrawal symptoms, and temptation. Before quitting, work with your health care provider to make a plan that can help you cope. Preparation can help you quit and keep you from giving in. How can I cope with cravings? Cravings usually last for 5-10 minutes. If you get through it, the craving will pass. Consider taking the following actions to help you cope with cravings:  Keep your mouth busy: ? Chew sugar-free gum. ? Suck on hard candies or a straw. ? Brush your teeth.  Keep your hands and body busy: ? Immediately change to a different activity when you feel a craving. ? Squeeze or play with a ball. ? Do an activity or a hobby, like making bead jewelry, practicing needlepoint, or working with wood. ? Mix up your normal routine. ? Take a short exercise break. Go for a quick walk or run up and down stairs. ? Spend time in public places where smoking is not allowed.  Focus on doing something kind or helpful for someone else.  Call a friend or family member to talk during a craving.  Join a support group.  Call a quit line, such as 1-800-QUIT-NOW.  Talk with your health care provider about medicines that might help you cope with cravings and make quitting easier for you. How can I deal with withdrawal symptoms? Your body may experience negative effects as it tries to get used to not having nicotine in the system. These effects are called withdrawal symptoms. They may include:  Feeling hungrier than normal.  Trouble concentrating.  Irritability.  Trouble sleeping.  Feeling depressed.  Restlessness and agitation.  Craving a cigarette. To manage withdrawal symptoms:  Avoid places, people, and activities that trigger your cravings.  Remember why you want to quit.  Get plenty of sleep.  Avoid coffee and other caffeinated drinks. These may worsen some of your  symptoms. How can I handle social situations? Social situations can be difficult when you are quitting smoking, especially in the first few weeks. To manage this, you can:  Avoid parties, bars, and other social situations where people might be smoking.  Avoid alcohol.  Leave right away if you have the urge to smoke.  Explain to your family and friends that you are quitting smoking. Ask for understanding and support.  Plan activities with friends or family where smoking is not an option. What are some ways I can cope with stress? Wanting to smoke may cause stress, and stress can make you want to smoke. Find ways to manage your stress. Relaxation techniques can help. For example:  Breathe slowly and deeply, in through your nose and out through your mouth.  Listen to soothing, relaxing music.  Talk with a family member or friend about your stress.  Light a candle.  Soak in a bath or take a shower.  Think about a peaceful place. What are some ways I can prevent weight gain? Be aware that many people gain weight after they quit smoking. However, not everyone does. To keep from gaining weight, have a plan in place before you quit and stick to the plan after you quit. Your plan should include:  Having healthy snacks. When you have a craving, it may help to: ? Eat plain popcorn, crunchy carrots, celery, or other cut vegetables. ? Chew sugar-free gum.  Changing how you eat: ? Eat small portion sizes at meals. ? Eat 4-6 small meals   throughout the day instead of 1-2 large meals a day. ? Be mindful when you eat. Do not watch television or do other things that might distract you as you eat.  Exercising regularly: ? Make time to exercise each day. If you do not have time for a long workout, do short bouts of exercise for 5-10 minutes several times a day. ? Do some form of strengthening exercise, like weight lifting, and some form of aerobic exercise, like running or swimming.  Drinking  plenty of water or other low-calorie or no-calorie drinks. Drink 6-8 glasses of water daily, or as much as instructed by your health care provider. Summary  Quitting smoking is a physical and mental challenge. You will face cravings, withdrawal symptoms, and temptation to smoke again. Preparation can help you as you go through these challenges.  You can cope with cravings by keeping your mouth busy (such as by chewing gum), keeping your body and hands busy, and making calls to family, friends, or a helpline for people who want to quit smoking.  You can cope with withdrawal symptoms by avoiding places where people smoke, avoiding drinks with caffeine, and getting plenty of rest.  Ask your health care provider about the different ways to prevent weight gain, avoid stress, and handle social situations. This information is not intended to replace advice given to you by your health care provider. Make sure you discuss any questions you have with your health care provider. Document Revised: 12/13/2016 Document Reviewed: 12/29/2015 Elsevier Patient Education  2020 [employer].  Managing Your Hypertension Hypertension is commonly called high blood pressure. This is when the force of your blood pressing against the walls of your arteries is too strong. Arteries are blood vessels that carry blood from your heart throughout your body. Hypertension forces the heart to work harder to pump blood, and may cause the arteries to become narrow or stiff. Having untreated or uncontrolled hypertension can cause heart attack, stroke, kidney disease, and other problems. What are blood pressure readings? A blood pressure reading consists of a higher number over a lower number. Ideally, your blood pressure should be below 120/80. The first ("top") number is called the systolic pressure. It is a measure of the pressure in your arteries as your heart beats. The second ("bottom") number is called the diastolic pressure. It  is a measure of the pressure in your arteries as the heart relaxes. What does my blood pressure reading mean? Blood pressure is classified into four stages. Based on your blood pressure reading, your health care provider may use the following stages to determine what type of treatment you need, if any. Systolic pressure and diastolic pressure are measured in a unit called mm Hg. Normal  Systolic pressure: below 655.  Diastolic pressure: below 80. Elevated  Systolic pressure: 374-827.  Diastolic pressure: below 80. Hypertension stage 1  Systolic pressure: 078-675.  Diastolic pressure: 44-92. Hypertension stage 2  Systolic pressure: 010 or above.  Diastolic pressure: 90 or above. What health risks are associated with hypertension? Managing your hypertension is an important responsibility. Uncontrolled hypertension can lead to:  A heart attack.  A stroke.  A weakened blood vessel (aneurysm).  Heart failure.  Kidney damage.  Eye damage.  Metabolic syndrome.  Memory and concentration problems. What changes can I make to manage my hypertension? Hypertension can be managed by making lifestyle changes and possibly by taking medicines. Your health care provider will help you make a plan to bring your blood pressure within  a normal range. Eating and drinking   Eat a diet that is high in fiber and potassium, and low in salt (sodium), added sugar, and fat. An example eating plan is called the DASH (Dietary Approaches to Stop Hypertension) diet. To eat this way: ? Eat plenty of fresh fruits and vegetables. Try to fill half of your plate at each meal with fruits and vegetables. ? Eat whole grains, such as whole wheat pasta, brown rice, or whole grain bread. Fill about one quarter of your plate with whole grains. ? Eat low-fat diary products. ? Avoid fatty cuts of meat, processed or cured meats, and poultry with skin. Fill about one quarter of your plate with lean proteins such as  fish, chicken without skin, beans, eggs, and tofu. ? Avoid premade and processed foods. These tend to be higher in sodium, added sugar, and fat.  Reduce your daily sodium intake. Most people with hypertension should eat less than 1,500 mg of sodium a day.  Limit alcohol intake to no more than 1 drink a day for nonpregnant women and 2 drinks a day for men. One drink equals 12 oz of beer, 5 oz of wine, or 1 oz of hard liquor. Lifestyle  Work with your health care provider to maintain a healthy body weight, or to lose weight. Ask what an ideal weight is for you.  Get at least 30 minutes of exercise that causes your heart to beat faster (aerobic exercise) most days of the week. Activities may include walking, swimming, or biking.  Include exercise to strengthen your muscles (resistance exercise), such as weight lifting, as part of your weekly exercise routine. Try to do these types of exercises for 30 minutes at least 3 days a week.  Do not use any products that contain nicotine or tobacco, such as cigarettes and e-cigarettes. If you need help quitting, ask your health care provider.  Control any long-term (chronic) conditions you have, such as high cholesterol or diabetes. Monitoring  Monitor your blood pressure at home as told by your health care provider. Your personal target blood pressure may vary depending on your medical conditions, your age, and other factors.  Have your blood pressure checked regularly, as often as told by your health care provider. Working with your health care provider  Review all the medicines you take with your health care provider because there may be side effects or interactions.  Talk with your health care provider about your diet, exercise habits, and other lifestyle factors that may be contributing to hypertension.  Visit your health care provider regularly. Your health care provider can help you create and adjust your plan for managing hypertension. Will  I need medicine to control my blood pressure? Your health care provider may prescribe medicine if lifestyle changes are not enough to get your blood pressure under control, and if:  Your systolic blood pressure is 130 or higher.  Your diastolic blood pressure is 80 or higher. Take medicines only as told by your health care provider. Follow the directions carefully. Blood pressure medicines must be taken as prescribed. The medicine does not work as well when you skip doses. Skipping doses also puts you at risk for problems. Contact a health care provider if:  You think you are having a reaction to medicines you have taken.  You have repeated (recurrent) headaches.  You feel dizzy.  You have swelling in your ankles.  You have trouble with your vision. Get help right away if:  You develop  a severe headache or confusion.  You have unusual weakness or numbness, or you feel faint.  You have severe pain in your chest or abdomen.  You vomit repeatedly.  You have trouble breathing. Summary  Hypertension is when the force of blood pumping through your arteries is too strong. If this condition is not controlled, it may put you at risk for serious complications.  Your personal target blood pressure may vary depending on your medical conditions, your age, and other factors. For most people, a normal blood pressure is less than 120/80.  Hypertension is managed by lifestyle changes, medicines, or both. Lifestyle changes include weight loss, eating a healthy, low-sodium diet, exercising more, and limiting alcohol. This information is not intended to replace advice given to you by your health care provider. Make sure you discuss any questions you have with your health care provider. Document Revised: 04/24/2018 Document Reviewed: 11/29/2015 Elsevier Patient Education  [place].

## 2019-10-08 NOTE — Progress Notes (Signed)
Established Patient Office Visit  Subjective:  Patient ID: [name], male    DOB: 1956/01/09  Age: 64 y.o. MRN: [id number]  CC:  Chief Complaint  Patient presents with  . Hypertension    HPI [name], 64 year old male with hypertension, hyperlipidemia/hypercholesterolemia, history of prediabetes and COPD who presents in follow-up.  He reports that since his visit in June of this year, that he is now taking all of his prescribed medications including medication for hypertension and cholesterol.  He needs refills of all of his current medications.  He denies any headaches or dizziness related to his blood pressure.  On review of systems, he has noticed some mild bilateral leg swelling since starting the amlodipine.  He denies any increased muscle or joint aches with the use of pravastatin.  He continues to follow a healthy diet, eating mostly chicken and vegetables.  He has had no increased thirst and no urinary frequency.  He feels that his breathing is stable.  He does have chronic shortness of breath and occasional nonproductive cough.  He denies any fever or chills.  No wheezing.  He does continue to smoke half pack per day of cigarettes.  He has smoked up to 1 pack/day in the past.  He has attempted to quit smoking in the past.  He states that he was successful and stopping smoking on at least 2 occasions but when he was in a situation in which he felt very stressed, he started smoking again.  He has tried over-the-counter patches however he states that these caused skin irritation.  He has been able to use patches during hospitalizations and these did not cause skin irritation.  He would be interested in smoking cessation.  He does not wish to receive the influenza immunization.  He reports that he has not had a prior colonoscopy.  He denies any blood in the stool or black stools.  He has not yet received the COVID-19 vaccine and would like to discuss vaccination/COVID-19 at  today's visit.  Past Medical History:  Diagnosis Date  . Asthma   . COPD (chronic obstructive pulmonary disease) ([place])   . Hypertension     Past Surgical History:  Procedure Laterality Date  . HAND SURGERY      Family History  Problem Relation Age of Onset  . Hypertension Paternal Uncle   . Cancer Maternal Grandmother   . Hypertension Maternal Grandfather     Social History   Socioeconomic History  . Marital status: Married    Spouse name: Not on file  . Number of children: Not on file  . Years of education: Not on file  . Highest education level: Not on file  Occupational History  . Not on file  Tobacco Use  . Smoking status: Current Some Day Smoker    Packs/day: 1.00    Years: 45.00    Pack years: 45.00    Types: Cigarettes  . Smokeless tobacco: Never Used  Vaping Use  . Vaping Use: Never used  Substance and Sexual Activity  . Alcohol use: Yes    Comment: "every once in awhile"  . Drug use: No    Types: Marijuana    Comment: occasionally marijuana  . Sexual activity: Not on file  Other Topics Concern  . Not on file  Social History Narrative  . Not on file   Social Determinants of Health   Financial Resource Strain:   . Difficulty of Paying Living Expenses: Not on file  Food Insecurity:   . Worried About Charity fundraiser in the Last Year: Not on file  . Ran Out of Food in the Last Year: Not on file  Transportation Needs:   . Lack of Transportation (Medical): Not on file  . Lack of Transportation (Non-Medical): Not on file  Physical Activity:   . Days of Exercise per Week: Not on file  . Minutes of Exercise per Session: Not on file  Stress:   . Feeling of Stress : Not on file  Social Connections:   . Frequency of Communication with Friends and Family: Not on file  . Frequency of Social Gatherings with Friends and Family: Not on file  . Attends Religious Services: Not on file  . Active Member of Clubs or Organizations: Not on file  . Attends  Archivist Meetings: Not on file  . Marital Status: Not on file  Intimate Partner Violence:   . Fear of Current or Ex-Partner: Not on file  . Emotionally Abused: Not on file  . Physically Abused: Not on file  . Sexually Abused: Not on file    Outpatient Medications Prior to Visit  Medication Sig Dispense Refill  . albuterol (VENTOLIN HFA) 108 (90 Base) MCG/ACT inhaler Inhale 2 puffs into the lungs every 4 (four) hours as needed for wheezing or shortness of breath. 18 g 1  . amLODipine (NORVASC) 10 MG tablet Take 1 tablet (10 mg total) by mouth daily. To lower blood pressure 90 tablet 1  . aspirin 81 MG EC tablet Take 1 tablet (81 mg total) daily by mouth. (Patient not taking: Reported on 06/11/2017) 30 tablet 0  . Fluticasone-Salmeterol (ADVAIR DISKUS) 250-50 MCG/DOSE AEPB Inhale 1 puff into the lungs 2 (two) times daily. 60 each 3  . ipratropium-albuterol (DUONEB) 0.5-2.5 (3) MG/3ML SOLN Take 3 mLs by nebulization every 6 (six) hours as needed (severe wheezing). 360 mL 3  . montelukast (SINGULAIR) 10 MG tablet Take 1 tablet (10 mg total) by mouth at bedtime. 30 tablet 3  . pravastatin (PRAVACHOL) 10 MG tablet One pill each evening to help lower cholesterol 90 tablet 1  . tiotropium (SPIRIVA HANDIHALER) 18 MCG inhalation capsule Place 1 capsule (18 mcg total) into inhaler and inhale daily. 30 capsule 3   No facility-administered medications prior to visit.    Allergies  Allergen Reactions  . Codeine Itching  . Penicillins Itching and Other (See Comments)    Has patient had a PCN reaction causing immediate rash, facial/tongue/throat swelling, SOB or lightheadedness with hypotension: No Has patient had a PCN reaction causing severe rash involving mucus membranes or skin necrosis: No Has patient had a PCN reaction that required hospitalization No Has patient had a PCN reaction occurring within the last 10 years: No If all of the above answers are "NO", then may proceed with  Cephalosporin use.    ROS Review of Systems  Constitutional: Negative for chills and fever.  HENT: Negative for sore throat and trouble swallowing.   Respiratory: Positive for cough and shortness of breath.   Cardiovascular: Positive for leg swelling. Negative for chest pain and palpitations.  Gastrointestinal: Negative for abdominal pain, blood in stool, constipation, diarrhea and nausea.  Endocrine: Negative for polydipsia, polyphagia and polyuria.  Genitourinary: Negative for dysuria and frequency.  Musculoskeletal: Negative for arthralgias and back pain.  Skin: Negative for rash and wound.  Neurological: Negative for dizziness and headaches.  Hematological: Negative for adenopathy. Does not bruise/bleed easily.  Psychiatric/Behavioral: Negative for suicidal  ideas. The patient is not nervous/anxious.       Objective:    Physical Exam Vitals and nursing note reviewed.  Constitutional:      General: He is not in acute distress.    Appearance: He is obese.     Comments: Very pleasant overweight older male in no acute distress.  Patient has occasional cough when laughing.  Neck:     Vascular: No carotid bruit.  Cardiovascular:     Rate and Rhythm: Normal rate and regular rhythm.  Pulmonary:     Effort: Pulmonary effort is normal.     Breath sounds: Normal breath sounds. No wheezing or rhonchi.     Comments: Breath sounds are somewhat distant Abdominal:     Palpations: Abdomen is soft.     Tenderness: There is no abdominal tenderness. There is no guarding or rebound.  Musculoskeletal:     Cervical back: Normal range of motion and neck supple.     Right lower leg: No edema.     Left lower leg: Edema (Mild distal left lower extremity edema with indentations in the skin secondary to his sock) present.  Lymphadenopathy:     Cervical: No cervical adenopathy.  Skin:    General: Skin is warm and dry.  Neurological:     General: No focal deficit present.     Mental Status: He  is alert and oriented to person, place, and time.  Psychiatric:        Mood and Affect: Mood normal.        Behavior: Behavior normal.     BP 140/80   Pulse 100   Ht 5\' 9"  (1.753 m)   Wt 240 lb 9.6 oz (109.1 kg)   SpO2 96%   BMI 35.53 kg/m  Wt Readings from Last 3 Encounters:  07/08/19 245 lb (111.1 kg)  01/28/18 250 lb (113.4 kg)  12/18/17 248 lb 12.8 oz (112.9 kg)     Health Maintenance Due  Topic Date Due  . COVID-19 Vaccine (1) Never done  . COLONOSCOPY  Never done  . INFLUENZA VACCINE  Never done   -Patient was offered but declined influenza immunization.  He agrees to GI referral for colonoscopy.  COVID-19 immunization and COVID-19 infection discussed at today's visit for greater than 5 minutes along with discussion regarding smoking cessation.  Patient states that he will have COVID-19 immunization done at his local pharmacy.   Lab Results  Component Value Date   TSH 0.778 07/15/2013   Lab Results  Component Value Date   WBC 4.5 07/08/2019   HGB 14.6 07/08/2019   HCT 44.0 07/08/2019   MCV 94 07/08/2019   PLT 169 07/08/2019   Lab Results  Component Value Date   NA 140 07/08/2019   K 4.1 07/08/2019   CO2 28 07/08/2019   GLUCOSE 94 07/08/2019   BUN 8 07/08/2019   CREATININE 0.94 07/08/2019   BILITOT 0.5 07/08/2019   ALKPHOS 106 07/08/2019   AST 16 07/08/2019   ALT 13 07/08/2019   PROT 6.5 07/08/2019   ALBUMIN 4.3 07/08/2019   CALCIUM 9.1 07/08/2019   ANIONGAP 8 06/12/2017   Lab Results  Component Value Date   CHOL 194 07/08/2019   Lab Results  Component Value Date   HDL 46 07/08/2019   Lab Results  Component Value Date   LDLCALC 134 (H) 07/08/2019   Lab Results  Component Value Date   TRIG 76 07/08/2019   Lab Results  Component Value Date  CHOLHDL 4.2 07/08/2019   Lab Results  Component Value Date   HGBA1C 5.5 07/08/2019      Assessment & Plan:  1. Essential hypertension Blood pressure is at goal of 140/80 or less.  He is to  continue the use of amlodipine.  He did report some increased swelling with use of this medication.  If he continues to have issues with swelling, he is to contact the office.  We will likely add hydrochlorothiazide or if significant swelling, he may be changed to a medication other than amlodipine as this medication can cause swelling. - amLODipine (NORVASC) 10 MG tablet; Take 1 tablet (10 mg total) by mouth daily. To lower blood pressure  Dispense: 90 tablet; Refill: 1  2. COPD mixed type ([place]); 5.  Tobacco dependence; 8.  Advice given about COVID-19 infection/immunization COPD is currently stable.  He is given a refill of all of his current respiratory medications.  COVID-19 vaccination was also discussed at length.  Discussed with patient that he is in an increased risk of serious complications including the need for intubation if he was to contract COVID-19 with subsequent viral pneumonia.  Due to his underlying health issues, he would also be at an increased risk for death.  Patient reports that he believes he will now get the COVID-19 vaccination at a local pharmacy.  He was also offered influenza immunization at today's visit which he declined.  He does agree to follow-up with the clinical pharmacist for help with smoking cessation.  He reports that he currently smokes half pack per day.  He has been able to successfully stop smoking twice in the past but restarted use of cigarettes when he became stressed.  Greater than 5 minutes was spent on discussion of smoking cessation at today's visit.  Handout also provided on coping with smoking as part of after visit summary. - Fluticasone-Salmeterol (ADVAIR DISKUS) 250-50 MCG/DOSE AEPB; Inhale 1 puff into the lungs 2 (two) times daily.  Dispense: 3 each; Refill: 3 - montelukast (SINGULAIR) 10 MG tablet; Take 1 tablet (10 mg total) by mouth at bedtime.  Dispense: 90 tablet; Refill: 3 - tiotropium (SPIRIVA HANDIHALER) 18 MCG inhalation capsule; Place 1  capsule (18 mcg total) into inhaler and inhale daily.  Dispense: 90 capsule; Refill: 3 - albuterol (VENTOLIN HFA) 108 (90 Base) MCG/ACT inhaler; Inhale 2 puffs into the lungs every 4 (four) hours as needed for wheezing or shortness of breath.  Dispense: 18 g; Refill: 1  3. History of prediabetes; 9.  Obesity Patient with hemoglobin A1c done at his last visit in June of this year which was normal at 5.5.  He is encouraged to continue a healthy diet and efforts at weight loss.  4. Pure hypercholesterolemia He reports that he is now taking pravastatin daily.  Labs from June were reviewed and discussed with the patient at today's visit.  Patient with normal lipid panel with exception of LDL of 134 on 07/08/2019.  He is also encouraged to continue healthy, low-fat diet. - pravastatin (PRAVACHOL) 10 MG tablet; One pill each evening to help lower cholesterol  Dispense: 90 tablet; Refill: 1  6. Screening for colon cancer He reports that he has never had a colonoscopy and he agrees to be referred to gastroenterology for baseline colonoscopy as a screening test for colon cancer. - Ambulatory referral to Gastroenterology     Follow-up: Return in about 6 months (around 04/06/2020) for chronic issues- sooner if needed; needs appt with [facility] for smoking cessation.  Antony Blackbird, MD

## 2019-10-29 ENCOUNTER — Ambulatory Visit: Payer: Self-pay | Admitting: *Deleted

## 2019-10-29 NOTE — Telephone Encounter (Signed)
Summary: call back request   Pt returned call, has unconverted referral case on chart.      Call to patient- information provided about referral. Contact number given.  Reason for Disposition . General information question, no triage required and triager able to answer question  Answer Assessment - Initial Assessment Questions 1. REASON FOR CALL or QUESTION: "What is your reason for calling today?" or "How can I best help you?" or "What question do you have that I can help answer?"     Patient was calling about his GI referral- information provided  Protocols used: [place]

## 2019-11-02 NOTE — Telephone Encounter (Signed)
Good Afternoon  I spoke to patient and I give him the phone number of [facility]  to schedule an appointment at his convenience time

## 2019-11-02 NOTE — Telephone Encounter (Signed)
Thank you :)

## 2019-11-11 ENCOUNTER — Encounter: Payer: Self-pay | Admitting: Internal Medicine

## 2019-11-15 MED FILL — SPIRIVA 18 MCG CP-HANDIHALE: 18 | 30 days supply | Qty: 30 | Fill #1

## 2019-11-15 MED FILL — IPRAT-ALBUT 0.5-3(2.5) MG/3: 0.5-2.5 (3) | 30 days supply | Qty: 360 | Fill #1

## 2019-12-13 MED FILL — PRAVASTATIN NA 10 MG TAB: 10 | 30 days supply | Qty: 30 | Fill #1

## 2019-12-13 MED FILL — SPIRIVA 18 MCG CP-HANDIHALE: 18 | 30 days supply | Qty: 30 | Fill #2

## 2019-12-13 MED FILL — ALBUTEROL SULFATE HFA 108 (: 108 (90 BAS | 25 days supply | Qty: 18 | Fill #1

## 2019-12-14 MED FILL — MONTELUKAST SOD 10 MG TAB: 10 | 30 days supply | Qty: 30 | Fill #1

## 2019-12-27 ENCOUNTER — Other Ambulatory Visit: Payer: Self-pay | Admitting: Internal Medicine

## 2019-12-27 ENCOUNTER — Other Ambulatory Visit: Payer: Self-pay

## 2019-12-27 ENCOUNTER — Ambulatory Visit (AMBULATORY_SURGERY_CENTER): Payer: Self-pay | Admitting: *Deleted

## 2019-12-27 VITALS — Ht 69.0 in | Wt 240.0 lb

## 2019-12-27 DIAGNOSIS — Z1211 Encounter for screening for malignant neoplasm of colon: Secondary | ICD-10-CM

## 2019-12-27 MED ORDER — PEG 3350-KCL-NA BICARB-NACL 420 G PO SOLR: 4000.0000 mL | Freq: Once | ORAL | 0 refills | Status: DC

## 2019-12-27 NOTE — Progress Notes (Signed)
Patient is here in-person for PV. Patient denies any allergies to eggs or soy. Patient denies any problems with anesthesia/sedation. Patient denies any oxygen use at home. Patient denies taking any diet/weight loss medications or blood thinners. Patient is not being treated for MRSA or C-diff. Patient is aware of our care-partner policy and [id number] safety protocol.  COVID-19 vaccines completed on 12/06/2019, per patient.

## 2020-01-05 MED FILL — PEG 3350-ELECTROLYTE SOLN: 420 | 1 days supply | Qty: 4000 | Fill #0

## 2020-01-10 ENCOUNTER — Encounter: Payer: Self-pay | Admitting: Internal Medicine

## 2020-01-10 ENCOUNTER — Other Ambulatory Visit: Payer: Self-pay

## 2020-01-10 ENCOUNTER — Ambulatory Visit (AMBULATORY_SURGERY_CENTER): Payer: Self-pay | Admitting: Internal Medicine

## 2020-01-10 VITALS — BP 146/85 | HR 77 | Temp 98.8°F | Resp 22 | Ht 69.0 in | Wt 240.0 lb

## 2020-01-10 DIAGNOSIS — D125 Benign neoplasm of sigmoid colon: Secondary | ICD-10-CM

## 2020-01-10 DIAGNOSIS — D127 Benign neoplasm of rectosigmoid junction: Secondary | ICD-10-CM

## 2020-01-10 DIAGNOSIS — D128 Benign neoplasm of rectum: Secondary | ICD-10-CM

## 2020-01-10 DIAGNOSIS — Z1211 Encounter for screening for malignant neoplasm of colon: Secondary | ICD-10-CM

## 2020-01-10 MED ORDER — SODIUM CHLORIDE 0.9 % IV SOLN
500.0000 mL | INTRAVENOUS | Status: DC
Start: 2020-01-10 — End: 2020-01-10

## 2020-01-10 NOTE — Progress Notes (Signed)
Called to room to assist during endoscopic procedure.  Patient ID and intended procedure confirmed with present staff. Received instructions for my participation in the procedure from the performing physician.  

## 2020-01-10 NOTE — Progress Notes (Signed)
Report to PACU, RN, vss, BBS= Clear.  

## 2020-01-10 NOTE — Progress Notes (Signed)
Pt's states no medical or surgical changes since previsit or office visit. 

## 2020-01-10 NOTE — Op Note (Signed)
[place] Patient Name: [name] Procedure Date: 01/10/2020 8:49 AM MRN: [id number] Endoscopist: Gatha Mayer , MD Age: 64 Referring MD:  Date of Birth: December 12, 1955 Gender: Male Account #: [id number] Procedure:                Colonoscopy Indications:              Screening for colorectal malignant neoplasm, This                            is the patient's first colonoscopy Medicines:                Monitored Anesthesia Care Procedure:                Pre-Anesthesia Assessment:                           - Prior to the procedure, a History and Physical                            was performed, and patient medications and                            allergies were reviewed. The patient's tolerance of                            previous anesthesia was also reviewed. The risks                            and benefits of the procedure and the sedation                            options and risks were discussed with the patient.                            All questions were answered, and informed consent                            was obtained. Prior Anticoagulants: The patient has                            taken no previous anticoagulant or antiplatelet                            agents. ASA Grade Assessment: II - A patient with                            mild systemic disease. After reviewing the risks                            and benefits, the patient was deemed in                            satisfactory condition to undergo the procedure.  After obtaining informed consent, the colonoscope                            was passed under direct vision. Throughout the                            procedure, the patient's blood pressure, pulse, and                            oxygen saturations were monitored continuously. The                            Colonoscope was introduced through the anus and                            advanced to the the cecum,  identified by                            appendiceal orifice and ileocecal valve. The                            colonoscopy was performed without difficulty. The                            patient tolerated the procedure well. The quality                            of the bowel preparation was adequate. The                            ileocecal valve, appendiceal orifice, and rectum                            were photographed. The bowel preparation used was                            Miralax via split dose instruction. Scope In: 9:01:09 AM Scope Out: 9:23:00 AM Scope Withdrawal Time: 0 hours 18 minutes 55 seconds  Total Procedure Duration: 0 hours 21 minutes 51 seconds  Findings:                 The perianal and digital rectal examinations were                            normal. Pertinent negatives include normal prostate                            (size, shape, and consistency).                           Two sessile polyps were found in the rectum and                            sigmoid colon. The polyps were diminutive in size.  These polyps were removed with a cold snare.                            Resection and retrieval were complete. Verification                            of patient identification for the specimen was                            done. Estimated blood loss was minimal.                           A 1 mm polyp was found in the rectum. The polyp was                            sessile. The polyp was removed with a cold biopsy                            forceps. Resection and retrieval were complete.                            Verification of patient identification for the                            specimen was done. Estimated blood loss was minimal.                           The exam was otherwise without abnormality on                            direct and retroflexion views. Complications:            No immediate complications. Estimated  Blood Loss:     Estimated blood loss was minimal. Impression:               - Two diminutive polyps in the rectum and in the                            sigmoid colon, removed with a cold snare. Resected                            and retrieved.                           - One 1 mm polyp in the rectum, removed with a cold                            biopsy forceps. Resected and retrieved.                           - The examination was otherwise normal on direct                            and retroflexion views. Recommendation:           -  Patient has a contact number available for                            emergencies. The signs and symptoms of potential                            delayed complications were discussed with the                            patient. Return to normal activities tomorrow.                            Written discharge instructions were provided to the                            patient.                           - Resume previous diet. Gatha Mayer, MD 01/10/2020 9:36:46 AM This report has been signed electronically.

## 2020-01-10 NOTE — Patient Instructions (Addendum)
I found and removed 3 tiny polyps.  We will get these analyzed to see what they were.  I feel certain they are benign though they could be what we call precancerous which is common and nothing to worry about.  I will let you know pathology results and when to have another routine colonoscopy by mail and/or My Chart.  I appreciate the opportunity to care for you. Gatha Mayer, MD, FACG  YOU HAD AN ENDOSCOPIC PROCEDURE TODAY AT [facility]:   Refer to the procedure report that was given to you for any specific questions about what was found during the examination.  If the procedure report does not answer your questions, please call your gastroenterologist to clarify.  If you requested that your care partner not be given the details of your procedure findings, then the procedure report has been included in a sealed envelope for you to review at your convenience later.  YOU SHOULD EXPECT: Some feelings of bloating in the abdomen. Passage of more gas than usual.  Walking can help get rid of the air that was put into your GI tract during the procedure and reduce the bloating. If you had a lower endoscopy (such as a colonoscopy or flexible sigmoidoscopy) you may notice spotting of blood in your stool or on the toilet paper. If you underwent a bowel prep for your procedure, you may not have a normal bowel movement for a few days.  Please Note:  You might notice some irritation and congestion in your nose or some drainage.  This is from the oxygen used during your procedure.  There is no need for concern and it should clear up in a day or so.  SYMPTOMS TO REPORT IMMEDIATELY:   Following lower endoscopy (colonoscopy or flexible sigmoidoscopy):  Excessive amounts of blood in the stool  Significant tenderness or worsening of abdominal pains  Swelling of the abdomen that is new, acute  Fever of 100F or higher   Following upper endoscopy (EGD)  Vomiting of blood or coffee ground  material  New chest pain or pain under the shoulder blades  Painful or persistently difficult swallowing  New shortness of breath  Fever of 100F or higher  Black, tarry-looking stools  For urgent or emergent issues, a gastroenterologist can be reached at any hour by calling [phone]. Do not use MyChart messaging for urgent concerns.    DIET:  We do recommend a small meal at first, but then you may proceed to your regular diet.  Drink plenty of fluids but you should avoid alcoholic beverages for 24 hours.  ACTIVITY:  You should plan to take it easy for the rest of today and you should NOT DRIVE or use heavy machinery until tomorrow (because of the sedation medicines used during the test).    FOLLOW UP: Our staff will call the number listed on your records 48-72 hours following your procedure to check on you and address any questions or concerns that you may have regarding the information given to you following your procedure. If we do not reach you, we will leave a message.  We will attempt to reach you two times.  During this call, we will ask if you have developed any symptoms of COVID 19. If you develop any symptoms (ie: fever, flu-like symptoms, shortness of breath, cough etc.) before then, please call [phone].  If you test positive for Covid 19 in the 2 weeks post procedure, please call and report this information  to [place].    If any biopsies were taken you will be contacted by phone or by letter within the next 1-3 weeks.  Please call us at [phone] if you have not heard about the biopsies in 3 weeks.    SIGNATURES/CONFIDENTIALITY: You and/or your care partner have signed paperwork which will be entered into your electronic medical record.  These signatures attest to the fact that that the information above on your After Visit Summary has been reviewed and is understood.  Full responsibility of the confidentiality of this discharge information lies with you and/or your  care-partner.

## 2020-01-11 ENCOUNTER — Telehealth: Payer: Self-pay

## 2020-01-11 NOTE — Telephone Encounter (Signed)
LVM

## 2020-01-13 ENCOUNTER — Encounter: Payer: Self-pay | Admitting: Internal Medicine

## 2020-01-13 DIAGNOSIS — Z86010 Personal history of colonic polyps: Secondary | ICD-10-CM

## 2020-01-13 DIAGNOSIS — Z860101 Personal history of adenomatous and serrated colon polyps: Secondary | ICD-10-CM

## 2020-01-13 HISTORY — DX: Personal history of colonic polyps: Z86.010

## 2020-01-13 HISTORY — DX: Personal history of adenomatous and serrated colon polyps: Z86.0101

## 2020-01-25 MED FILL — ALBUTEROL SULFATE HFA 108 (: 108 (90 BAS | 16 days supply | Qty: 18 | Fill #1

## 2020-01-25 MED FILL — SPIRIVA 18 MCG CP-HANDIHALE: 18 | 30 days supply | Qty: 30 | Fill #3

## 2020-02-21 ENCOUNTER — Other Ambulatory Visit: Payer: Self-pay | Admitting: Physician Assistant

## 2020-02-21 DIAGNOSIS — J449 Chronic obstructive pulmonary disease, unspecified: Secondary | ICD-10-CM

## 2020-02-21 NOTE — Telephone Encounter (Signed)
Requested medication (s) are due for refill today: yes  Requested medication (s) are on the active medication list: yes  Last refill: 01/25/20  Future visit scheduled: no  Notes to clinic:  previously seen by Dr. Antony Blackbird    Requested Prescriptions  Pending Prescriptions Disp Refills   albuterol (VENTOLIN HFA) 108 (90 Base) MCG/ACT inhaler [Pharmacy Med Name: ALBUTEROL SULFATE HFA 108 ( 108 (90 BAS Aerosol] 18 g 1    Sig: Inhale 2 puffs into the lungs every 4 (four) hours as needed for wheezing or shortness of breath.      Pulmonology:  Beta Agonists Failed - 02/21/2020 10:38 AM      Failed - One inhaler should last at least one month. If the patient is requesting refills earlier, contact the patient to check for uncontrolled symptoms.      Passed - Valid encounter within last 12 months    Recent Outpatient Visits           4 months ago Essential hypertension   [place], [place]   7 months ago Prediabetes   [place], [place], [place]   1 year ago Essential hypertension   [facility] [place], [place], MD   2 years ago Essential hypertension   [place], [place]   2 years ago Chronic obstructive pulmonary disease, unspecified COPD type [facility])   [facility] Antony Blackbird, MD

## 2020-02-22 ENCOUNTER — Other Ambulatory Visit: Payer: Self-pay | Admitting: Family Medicine

## 2020-02-22 MED FILL — ALBUTEROL SULFATE HFA 108 (: 108 (90 BAS | 18 days supply | Qty: 153 | Fill #0

## 2020-03-22 ENCOUNTER — Other Ambulatory Visit: Payer: Self-pay | Admitting: Family Medicine

## 2020-03-22 DIAGNOSIS — J449 Chronic obstructive pulmonary disease, unspecified: Secondary | ICD-10-CM

## 2020-03-22 MED FILL — MONTELUKAST SOD 10 MG TAB: 10 | 30 days supply | Qty: 30 | Fill #2

## 2020-03-22 MED FILL — IPRAT-ALBUT 0.5-3(2.5) MG/3: 0.5-2.5 (3) | 30 days supply | Qty: 360 | Fill #2

## 2020-04-06 ENCOUNTER — Ambulatory Visit: Payer: Self-pay | Admitting: Family Medicine

## 2020-04-15 ENCOUNTER — Other Ambulatory Visit: Payer: Self-pay

## 2020-04-27 ENCOUNTER — Other Ambulatory Visit: Payer: Self-pay

## 2020-04-27 ENCOUNTER — Ambulatory Visit: Payer: Self-pay | Attending: Physician Assistant | Admitting: Physician Assistant

## 2020-04-27 ENCOUNTER — Encounter: Payer: Self-pay | Admitting: Physician Assistant

## 2020-04-27 DIAGNOSIS — Z87898 Personal history of other specified conditions: Secondary | ICD-10-CM

## 2020-04-27 DIAGNOSIS — J449 Chronic obstructive pulmonary disease, unspecified: Secondary | ICD-10-CM

## 2020-04-27 DIAGNOSIS — E7800 Pure hypercholesterolemia, unspecified: Secondary | ICD-10-CM

## 2020-04-27 DIAGNOSIS — I10 Essential (primary) hypertension: Secondary | ICD-10-CM

## 2020-04-27 MED ORDER — MONTELUKAST SODIUM 10 MG PO TABS
ORAL_TABLET | Freq: Every day | ORAL | 3 refills | Status: DC
  Filled 2020-04-27: qty 30, 30d supply, fill #0
  Filled 2020-07-28: qty 30, 30d supply, fill #1

## 2020-04-27 MED ORDER — AMLODIPINE BESYLATE 10 MG PO TABS
10.0000 mg | ORAL_TABLET | Freq: Every day | ORAL | 1 refills | Status: DC
  Filled 2020-04-27: qty 30, 30d supply, fill #0
  Filled 2020-07-28: qty 30, 30d supply, fill #1

## 2020-04-27 MED ORDER — ALBUTEROL SULFATE HFA 108 (90 BASE) MCG/ACT IN AERS
2.0000 | INHALATION_SPRAY | Freq: Four times a day (QID) | RESPIRATORY_TRACT | 2 refills | Status: DC | PRN
  Filled 2020-04-27: qty 18, 25d supply, fill #0
  Filled 2020-05-19: qty 18, 25d supply, fill #1
  Filled 2020-06-19: qty 18, 25d supply, fill #2

## 2020-04-27 MED ORDER — SPIRIVA HANDIHALER 18 MCG IN CAPS
18.0000 ug | ORAL_CAPSULE | Freq: Every day | RESPIRATORY_TRACT | 3 refills | Status: DC
  Filled 2020-04-27: qty 90, 90d supply, fill #0

## 2020-04-27 MED ORDER — FLUTICASONE-SALMETEROL 250-50 MCG/DOSE IN AEPB
1.0000 | INHALATION_SPRAY | Freq: Two times a day (BID) | RESPIRATORY_TRACT | 3 refills | Status: DC
  Filled 2020-04-27: qty 60, 30d supply, fill #0

## 2020-04-27 MED ORDER — PRAVASTATIN SODIUM 10 MG PO TABS
ORAL_TABLET | ORAL | 1 refills | Status: DC
  Filled 2020-04-27: qty 30, 30d supply, fill #0
  Filled 2020-07-28: qty 30, 30d supply, fill #1

## 2020-04-27 NOTE — Addendum Note (Signed)
Addended by: Roland Earl on: 04/27/2020 04:56 PM   Modules accepted: Orders

## 2020-04-27 NOTE — Progress Notes (Signed)
Virtual Visit via Telephone Note  I connected with [name] on 04/27/20 at 10:30 AM EDT by telephone and verified that I am speaking with the correct person using two identifiers.  Location: Patient: home Provider: [facility] office   I discussed the limitations, risks, security and privacy concerns of performing an evaluation and management service by telephone and the availability of in person appointments. I also discussed with the patient that there may be a patient responsible charge related to this service. The patient expressed understanding and agreed to proceed.   History of Present Illness:  Patient needs RF of meds.  He has been wheezing a lot with the allergy season and is running low on inhalers.  No fever.  No s/sx illness.  He is willing to come in today for labs since he has not had any done since last June.      Observations/Objective:  NAD.  A&Ox3   Assessment and Plan: 1. COPD mixed type ([place]) - montelukast (SINGULAIR) 10 MG tablet; TAKE 1 TABLET (10 MG TOTAL) BY MOUTH AT BEDTIME.  Dispense: 90 tablet; Refill: 3 - Fluticasone-Salmeterol (ADVAIR DISKUS) 250-50 MCG/DOSE AEPB; Inhale 1 puff into the lungs 2 (two) times daily.  Dispense: 3 each; Refill: 3 - tiotropium (SPIRIVA HANDIHALER) 18 MCG inhalation capsule; Place 1 capsule (18 mcg total) into inhaler and inhale daily.  Dispense: 90 capsule; Refill: 3 - albuterol (VENTOLIN HFA) 108 (90 Base) MCG/ACT inhaler; Inhale 2 puffs into the lungs every 6 (six) hours as needed for wheezing or shortness of breath.  Dispense: 18 g; Refill: 2  2. Pure hypercholesterolemia - pravastatin (PRAVACHOL) 10 MG tablet; TAKE 1 TABLET BY MOUTH EVERY EVENING TO HELP LOWER CHOLESTEROL  Dispense: 90 tablet; Refill: 1 - Lipid panel; Future  3. Essential hypertension He has agreed to check BP OOO.  We have discussed target BP range and blood pressure goal. I have advised patient to check BP regularly and to call us back or report to  clinic if the numbers are consistently higher than 140/90. We discussed the importance of compliance with medical therapy and DASH diet recommended, consequences of uncontrolled hypertension discussed.  - amLODipine (NORVASC) 10 MG tablet; Take 1 tablet (10 mg total) by mouth daily. To lower blood pressure  Dispense: 90 tablet; Refill: 1 - Comprehensive metabolic panel; Future  4. History of prediabetes I have had a lengthy discussion and provided education about insulin resistance and the intake of too much sugar/refined carbohydrates.  I have advised the patient to work at a goal of eliminating sugary drinks, candy, desserts, sweets, refined sugars, processed foods, and white carbohydrates.  The patient expresses understanding.  - Hemoglobin A1c; Future   Follow Up Instructions: See PCP in 4-6 months for chronic conditions   I discussed the assessment and treatment plan with the patient. The patient was provided an opportunity to ask questions and all were answered. The patient agreed with the plan and demonstrated an understanding of the instructions.   The patient was advised to call back or seek an in-person evaluation if the symptoms worsen or if the condition fails to improve as anticipated.  I provided 14 minutes of non-face-to-face time during this encounter.   Freeman Caldron, PA-C  Patient ID: [name], male   DOB: [birth year], 65 y.o.   MRN: [id number]

## 2020-04-28 LAB — COMPREHENSIVE METABOLIC PANEL
ALT: 20 IU/L (ref 0–44)
AST: 24 IU/L (ref 0–40)
Albumin/Globulin Ratio: 1.9 (ref 1.2–2.2)
Albumin: 4 g/dL (ref 3.8–4.8)
Alkaline Phosphatase: 100 IU/L (ref 44–121)
BUN/Creatinine Ratio: 7 — ABNORMAL LOW (ref 10–24)
BUN: 6 mg/dL — ABNORMAL LOW (ref 8–27)
Bilirubin Total: 0.7 mg/dL (ref 0.0–1.2)
CO2: 23 mmol/L (ref 20–29)
Calcium: 8.5 mg/dL — ABNORMAL LOW (ref 8.6–10.2)
Chloride: 105 mmol/L (ref 96–106)
Creatinine, Ser: 0.87 mg/dL (ref 0.76–1.27)
Globulin, Total: 2.1 g/dL (ref 1.5–4.5)
Glucose: 145 mg/dL — ABNORMAL HIGH (ref 65–99)
Potassium: 3.8 mmol/L (ref 3.5–5.2)
Sodium: 141 mmol/L (ref 134–144)
Total Protein: 6.1 g/dL (ref 6.0–8.5)
eGFR: 97 mL/min/{1.73_m2} (ref >59)

## 2020-04-28 LAB — LIPID PANEL
Chol/HDL Ratio: 4 ratio (ref 0.0–5.0)
Cholesterol, Total: 173 mg/dL (ref 100–199)
HDL: 43 mg/dL (ref >39)
LDL Chol Calc (NIH): 118 mg/dL — ABNORMAL HIGH (ref 0–99)
Triglycerides: 64 mg/dL (ref 0–149)
VLDL Cholesterol Cal: 12 mg/dL (ref 5–40)

## 2020-04-28 LAB — HEMOGLOBIN A1C
Est. average glucose Bld gHb Est-mCnc: 123 mg/dL
Hgb A1c MFr Bld: 5.9 % — ABNORMAL HIGH (ref 4.8–5.6)

## 2020-05-11 ENCOUNTER — Other Ambulatory Visit: Payer: Self-pay

## 2020-05-15 ENCOUNTER — Other Ambulatory Visit: Payer: Self-pay

## 2020-05-15 MED ORDER — FLUTICASONE-SALMETEROL 250-50 MCG/ACT IN AEPB: INHALATION_SPRAY | RESPIRATORY_TRACT | 10 refills | Status: DC

## 2020-05-19 ENCOUNTER — Other Ambulatory Visit: Payer: Self-pay

## 2020-06-19 ENCOUNTER — Other Ambulatory Visit: Payer: Self-pay

## 2020-07-28 ENCOUNTER — Other Ambulatory Visit: Payer: Self-pay

## 2020-07-28 ENCOUNTER — Other Ambulatory Visit: Payer: Self-pay | Admitting: Physician Assistant

## 2020-07-28 DIAGNOSIS — J449 Chronic obstructive pulmonary disease, unspecified: Secondary | ICD-10-CM

## 2020-07-28 MED ORDER — ALBUTEROL SULFATE HFA 108 (90 BASE) MCG/ACT IN AERS
2.0000 | INHALATION_SPRAY | Freq: Four times a day (QID) | RESPIRATORY_TRACT | 2 refills | Status: DC | PRN
  Filled 2020-07-28: qty 18, 25d supply, fill #0
  Filled 2020-09-06: qty 18, 25d supply, fill #1
  Filled 2020-10-19: qty 18, 25d supply, fill #2

## 2020-07-28 NOTE — Telephone Encounter (Signed)
Requested medication (s) are due for refill today: no  Requested medication (s) are on the active medication list: yes   Last refill:  06/19/2020  Future visit scheduled: yes  Notes to clinic:  Failed protocol:One inhaler should last at least one month. If the patient is requesting refills earlier, contact the patient to check for uncontrolled symptoms   Requested Prescriptions  Pending Prescriptions Disp Refills   albuterol (VENTOLIN HFA) 108 (90 Base) MCG/ACT inhaler 18 g 2    Sig: Inhale 2 puffs into the lungs every 6 (six) hours as needed for wheezing or shortness of breath.      Pulmonology:  Beta Agonists Failed - 07/28/2020  9:39 AM      Failed - One inhaler should last at least one month. If the patient is requesting refills earlier, contact the patient to check for uncontrolled symptoms.      Passed - Valid encounter within last 12 months    Recent Outpatient Visits           3 months ago History of prediabetes   [place], [place], [place]   9 months ago Essential hypertension   [place], [place], MD   1 year ago Prediabetes   [place], [place], [place]   2 years ago Essential hypertension   [place], [place], MD   2 years ago Essential hypertension   [place], MD       Future Appointments             In 2 months Charlott Rakes, MD [place]

## 2020-09-01 ENCOUNTER — Other Ambulatory Visit: Payer: Self-pay

## 2020-09-06 ENCOUNTER — Other Ambulatory Visit: Payer: Self-pay

## 2020-10-16 ENCOUNTER — Other Ambulatory Visit: Payer: Self-pay

## 2020-10-16 ENCOUNTER — Encounter: Payer: Self-pay | Admitting: Family Medicine

## 2020-10-16 ENCOUNTER — Ambulatory Visit: Payer: Self-pay | Attending: Family Medicine | Admitting: Family Medicine

## 2020-10-16 VITALS — BP 170/98 | HR 98 | Ht 69.0 in | Wt 234.4 lb

## 2020-10-16 DIAGNOSIS — J449 Chronic obstructive pulmonary disease, unspecified: Secondary | ICD-10-CM

## 2020-10-16 DIAGNOSIS — I10 Essential (primary) hypertension: Secondary | ICD-10-CM

## 2020-10-16 DIAGNOSIS — E7800 Pure hypercholesterolemia, unspecified: Secondary | ICD-10-CM

## 2020-10-16 DIAGNOSIS — Z720 Tobacco use: Secondary | ICD-10-CM

## 2020-10-16 MED ORDER — BREZTRI AEROSPHERE 160-9-4.8 MCG/ACT IN AERO
2.0000 | INHALATION_SPRAY | Freq: Two times a day (BID) | RESPIRATORY_TRACT | 6 refills | Status: DC
  Filled 2020-10-16: qty 10.7, 30d supply, fill #0
  Filled 2020-11-29: qty 10.7, 30d supply, fill #1
  Filled 2020-12-22: qty 10.7, 30d supply, fill #2
  Filled 2021-01-11: qty 10.7, 30d supply, fill #3

## 2020-10-16 MED ORDER — TRELEGY ELLIPTA 100-62.5-25 MCG/INH IN AEPB
1.0000 | INHALATION_SPRAY | Freq: Every day | RESPIRATORY_TRACT | 6 refills | Status: DC
  Filled 2020-10-16: qty 60, 30d supply, fill #0

## 2020-10-16 NOTE — Patient Instructions (Signed)
Fluticasone; Umeclidinium; Vilanterol inhalation powder What is this medication? FLUTICASONE; UMECLIDINIUM; VILANTEROL (floo TIK a sone; ue MEK li DIN ee um; vye LAN ter ol) inhalation is a combination of 3 drugs to treat COPD and asthma. Umeclidinium and Vilanterol are bronchodilators that help keep airways open. Fluticasone decreases inflammation in the lungs. Do not use this drug combination for acute asthma attacks or bronchospasm. This medicine may be used for other purposes; ask your health care provider or pharmacist if you have questions. COMMON BRAND NAME(S): TRELEGY ELLIPTA What should I tell my care team before I take this medication? They need to know if you have any of these conditions: bone problems diabetes eye disease, vision problems heart disease high blood pressure history of irregular heartbeat immune system problems infection kidney disease pheochromocytoma prostate disease seizures thyroid disease trouble passing urine an unusual or allergic reaction to fluticasone, umeclidinium, vilanterol, lactose, milk proteins, other medicines, foods, dyes, or preservatives pregnant or trying to get pregnant breast-feeding How should I use this medication? This drug is inhaled through the mouth. Rinse your mouth with water after use. Make sure not to swallow the water. Take it as directed on the prescription label at the same time every day. Do not use it more often than directed. A special MedGuide will be given to you by the pharmacist with each prescription and refill. Be sure to read this information carefully each time. Talk to your pediatrician about the use of this drug in children. Special care may be needed. Overdosage: If you think you have taken too much of this medicine contact a poison control center or emergency room at once. NOTE: This medicine is only for you. Do not share this medicine with others. What if I miss a dose? If you miss a dose, take it as soon as  you can. If it is almost time for your next dose, take only that dose. Do not take double or extra doses. What may interact with this medication? Do not take this medicine with any of the following medications: cisapride dofetilide dronedarone MAOIs like Carbex, Eldepryl, Marplan, Nardil, and Parnate pimozide thioridazine ziprasidone This medicine may also interact with the following medications: aclidinium antihistamines for allergy antiviral medicines for HIV or AIDS atropine beta-blockers like metoprolol and propranolol certain antibiotics like clarithromycin and telithromycin certain medicines for bladder problems like oxybutynin, tolterodine certain medicines for depression, anxiety, or psychotic disturbances certain medicines for fungal infections like ketoconazole, itraconazole, posaconazole, voriconazole certain medicines for Parkinson's disease like benztropine, trihexyphenidyl certain medicines for stomach problems like dicyclomine, hyoscyamine certain medicines for travel sickness like scopolamine conivaptan diuretics ipratropium medicines for colds other medicines for breathing problems other medicines that prolong the QT interval (cause an abnormal heart rhythm) nefazodone tiotropium This list may not describe all possible interactions. Give your health care provider a list of all the medicines, herbs, non-prescription drugs, or dietary supplements you use. Also tell them if you smoke, drink alcohol, or use illegal drugs. Some items may interact with your medicine. What should I watch for while using this medication? Visit your doctor or health care professional for regular checkups. Tell your doctor or health care professional if your symptoms do not get better. Do not use this medicine more than once every 24 hours. NEVER use this medicine for an acute asthma or COPD attack. You should use your short-acting rescue inhalers for this purpose. If your symptoms get worse  or if you need your short-acting inhalers more often, call your doctor  right away. If you are going to have surgery tell your doctor or health care professional that you are using this medicine. Try not to come in contact with people with the chicken pox or measles. If you do, call your doctor. This medicine may increase blood sugar. Ask your healthcare provider if changes in diet or medicines are needed if you have diabetes. What side effects may I notice from receiving this medication? Side effects that you should report to your doctor or health care professional as soon as possible: allergic reactions like skin rash or hives, swelling of the face, lips, or tongue breathing problems right after inhaling your medicine chest pain eye pain fast, irregular heartbeat feeling faint or lightheaded, falls fever or chills nausea, vomiting signs and symptoms of high blood sugar such as being more thirsty or hungry or having to urinate more than normal. You may also feel very tired or have blurry vision. trouble passing urine Side effects that usually do not require medical attention (report these to your doctor or health care professional if they continue or are bothersome): back pain changes in taste cough diarrhea headache nervousness sore throat tremor This list may not describe all possible side effects. Call your doctor for medical advice about side effects. You may report side effects to FDA at 1-800-FDA-1088. Where should I keep my medication? Keep out of the reach of children and pets. Store at room temperature between 20 and 25 degrees C (68 and 77 degrees F). Keep inhaler away from extreme heat, cold or humidity. Throw away 6 weeks after removing it from the foil pouch, when the dose counter reads "0" or after the expiration date, whichever is first. NOTE: This sheet is a summary. It may not cover all possible information. If you have questions about this medicine, talk to your doctor,  pharmacist, or health care provider.  2022 Elsevier/Gold Standard (2018-11-09 12:45:04)

## 2020-10-16 NOTE — Progress Notes (Signed)
Subjective:  Patient ID: [name], male    DOB: March 26, 1955  Age: 65 y.o. MRN: [id number]  CC: Hypertension   HPI [name] is a 65 y.o. year old male with a history of hypertension, hyperlipidemia who presents today to establish care.  Interval History: He sometimes forgets to take his antihypertensives. Also states he tries to use "natural stuff instead of chemicals". BP at home was 235 systolic. He is yet to take his antihypertensive today. Not taking his Pravastatin.  He finds he needs to use his rescue inhalers frequently despite using his ICS/LABA and LAMA regularly. Endorses smoking - smokes from 1ppd to half ppd.  Currently using Nicorette while working on cessation and declines additional pharmacotherapy. Symptoms he has include dyspnea, wheezing. 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 Past Medical History:  Diagnosis Date   Asthma    COPD (chronic obstructive pulmonary disease) ([place])    Hx of adenomatous polyp of colon 01/13/2020   Hypertension     Past Surgical History:  Procedure Laterality Date   HAND SURGERY Bilateral left 5732,2025 Rt    Family History  Problem Relation Age of Onset   Hypertension Paternal Uncle    Cancer Maternal Grandmother    Hypertension Maternal Grandfather    Colon cancer Cousin    Colon polyps Neg Hx    Esophageal cancer Neg Hx    Stomach cancer Neg Hx    Rectal cancer Neg Hx     Allergies  Allergen Reactions   Codeine Itching   Penicillins Itching and Other (See Comments)    Has patient had a PCN reaction causing immediate rash, facial/tongue/throat swelling, SOB or lightheadedness with hypotension: No Has patient had a PCN reaction causing severe rash involving mucus membranes or skin necrosis: No Has patient had a PCN reaction that required hospitalization No Has patient had a PCN reaction occurring within the last 10 years: No If all of the above answers  are "NO", then may proceed with Cephalosporin use.    Outpatient Medications Prior to Visit  Medication Sig Dispense Refill   albuterol (VENTOLIN HFA) 108 (90 Base) MCG/ACT inhaler Inhale 2 puffs into the lungs every 6 (six) hours as needed for wheezing or shortness of breath. 18 g 2   amLODipine (NORVASC) 10 MG tablet Take 1 tablet (10 mg total) by mouth daily. To lower blood pressure 90 tablet 1   montelukast (SINGULAIR) 10 MG tablet TAKE 1 TABLET (10 MG TOTAL) BY MOUTH AT BEDTIME. 90 tablet 3   polyethylene glycol-electrolytes (NULYTELY) 420 g solution TAKE 4,000 MLS BY MOUTH ONCE FOR 1 DOSE. 4000 mL 0   pravastatin (PRAVACHOL) 10 MG tablet TAKE 1 TABLET BY MOUTH EVERY EVENING TO HELP LOWER CHOLESTEROL 90 tablet 1   fluticasone-salmeterol (ADVAIR) 250-50 MCG/ACT AEPB Inhale 1 puff by mouth into the lungs twice daily. 60 each 10   ipratropium-albuterol (DUONEB) 0.5-2.5 (3) MG/3ML SOLN TAKE 3 MLS BY NEBULIZATION EVERY 6 (SIX) HOURS AS NEEDED (SEVERE WHEEZING). 360 mL 3   tiotropium (SPIRIVA HANDIHALER) 18 MCG inhalation capsule Place 1 capsule (18 mcg total) into inhaler and inhale daily. 90 capsule 3   No facility-administered medications prior to visit.     ROS Review of Systems  Constitutional:  Negative for activity change and appetite change.  HENT:  Negative for sinus pressure and sore throat.   Eyes:  Negative for visual disturbance.  Respiratory:  Positive for shortness  of breath. Negative for cough and chest tightness.   Cardiovascular:  Negative for chest pain and leg swelling.  Gastrointestinal:  Negative for abdominal distention, abdominal pain, constipation and diarrhea.  Endocrine: Negative.   Genitourinary:  Negative for dysuria.  Musculoskeletal:  Negative for joint swelling and myalgias.  Skin:  Negative for rash.  Allergic/Immunologic: Negative.   Neurological:  Negative for weakness, light-headedness and numbness.  Psychiatric/Behavioral:  Negative for dysphoric  mood and suicidal ideas.    Objective:  BP (!) 170/98   Pulse 98   Ht 5\' 9"  (1.753 m)   Wt 234 lb 6.4 oz (106.3 kg)   SpO2 95%   BMI 34.61 kg/m   BP/Weight 10/16/2020 01/10/2020 70/78/6754  Systolic BP 492 010 -  Diastolic BP 98 85 -  Wt. (Lbs) 234.4 240 240  BMI 34.61 35.44 35.44      Physical Exam Constitutional:      Appearance: He is well-developed.  Cardiovascular:     Rate and Rhythm: Normal rate.     Heart sounds: Normal heart sounds. No murmur heard. Pulmonary:     Effort: Pulmonary effort is normal.     Breath sounds: Normal breath sounds. No wheezing or rales.  Chest:     Chest wall: No tenderness.  Abdominal:     General: Bowel sounds are normal. There is no distension.     Palpations: Abdomen is soft. There is no mass.     Tenderness: There is no abdominal tenderness.  Musculoskeletal:        General: Normal range of motion.     Right lower leg: No edema.     Left lower leg: No edema.  Neurological:     Mental Status: He is alert and oriented to person, place, and time.  Psychiatric:        Mood and Affect: Mood normal.    CMP Latest Ref Rng & Units 04/27/2020 07/08/2019 12/18/2017  Glucose 65 - 99 mg/dL 145(H) 94 97  BUN 8 - 27 mg/dL 6(L) 8 7(L)  Creatinine 0.76 - 1.27 mg/dL 0.87 0.94 0.91  Sodium 134 - 144 mmol/L 141 140 139  Potassium 3.5 - 5.2 mmol/L 3.8 4.1 3.8  Chloride 96 - 106 mmol/L 105 101 100  CO2 20 - 29 mmol/L 23 28 27   Calcium 8.6 - 10.2 mg/dL 8.5(L) 9.1 9.3  Total Protein 6.0 - 8.5 g/dL 6.1 6.5 6.5  Total Bilirubin 0.0 - 1.2 mg/dL 0.7 0.5 0.4  Alkaline Phos 44 - 121 IU/L 100 106 97  AST 0 - 40 IU/L 24 16 18   ALT 0 - 44 IU/L 20 13 16     Lipid Panel     Component Value Date/Time   CHOL 173 04/27/2020 1659   TRIG 64 04/27/2020 1659   HDL 43 04/27/2020 1659   CHOLHDL 4.0 04/27/2020 1659   CHOLHDL 4.8 07/15/2013 1152   VLDL 19 07/15/2013 1152   LDLCALC 118 (H) 04/27/2020 1659    CBC    Component Value Date/Time   WBC 4.5  07/08/2019 0910   WBC 5.8 06/11/2017 1630   RBC 4.67 07/08/2019 0910   RBC 4.70 06/11/2017 1630   HGB 14.6 07/08/2019 0910   HCT 44.0 07/08/2019 0910   PLT 169 07/08/2019 0910   MCV 94 07/08/2019 0910   MCH 31.3 07/08/2019 0910   MCH 30.9 06/11/2017 1630   MCHC 33.2 07/08/2019 0910   MCHC 33.2 06/11/2017 1630   RDW 13.4 07/08/2019 0910   LYMPHSABS 1.7  07/08/2019 0910   MONOABS 1.1 (H) 06/11/2017 1630   EOSABS 0.2 07/08/2019 0910   BASOSABS 0.0 07/08/2019 0910    Lab Results  Component Value Date   HGBA1C 5.9 (H) 04/27/2020    Assessment & Plan:  1. COPD mixed type ([place]) Uncontrolled Discontinued Advair and Spiriva substituted initially with Trelegy but was informed by the pharmacy this was not covered by insurance.  I have switched to [place]. If symptoms remain uncontrolled will refer to pulmonary Advised that smoking cessation will be beneficial - Budeson-Glycopyrrol-Formoterol (BREZTRI AEROSPHERE) 160-9-4.8 MCG/ACT AERO; Inhale 2 puffs into the lungs 2 (two) times daily.  Dispense: 10.7 g; Refill: 6  2. Pure hypercholesterolemia Uncontrolled as he has not been compliant with pravastatin Advised to resume pravastatin Low-cholesterol diet  3. Essential hypertension Uncontrolled due to Non compliance Advised to resume Amlodipine Will follow up at next visit Counseled on blood pressure goal of less than 130/80, low-sodium, DASH diet, medication compliance, 150 minutes of moderate intensity exercise per week. Discussed medication compliance, adverse effects.   4. Tobacco abuse Spent 3 minutes counseling on smoking cessation and he declines pharmacotherapy but will use Nicorette gums   Meds ordered this encounter  Medications   DISCONTD: Fluticasone-Umeclidin-Vilant (TRELEGY ELLIPTA) 100-62.5-25 MCG/INH AEPB    Sig: Inhale 1 puff into the lungs daily.    Dispense:  60 each    Refill:  6    Discontinue Advair, discontinue Spiriva   Budeson-Glycopyrrol-Formoterol  (BREZTRI AEROSPHERE) 160-9-4.8 MCG/ACT AERO    Sig: Inhale 2 puffs into the lungs 2 (two) times daily.    Dispense:  10.7 g    Refill:  6    Follow-up: Return in about 7 weeks (around 12/04/2020) for COPD follow up.       Charlott Rakes, MD, FAAFP. [facility] and [place], [place]   10/16/2020, 1:01 PM

## 2020-10-18 ENCOUNTER — Other Ambulatory Visit: Payer: Self-pay

## 2020-10-19 ENCOUNTER — Other Ambulatory Visit: Payer: Self-pay

## 2020-11-27 ENCOUNTER — Other Ambulatory Visit: Payer: Self-pay

## 2020-11-27 ENCOUNTER — Other Ambulatory Visit: Payer: Self-pay | Admitting: Physician Assistant

## 2020-11-27 ENCOUNTER — Other Ambulatory Visit: Payer: Self-pay | Admitting: Family Medicine

## 2020-11-27 DIAGNOSIS — J449 Chronic obstructive pulmonary disease, unspecified: Secondary | ICD-10-CM

## 2020-11-27 MED ORDER — ALBUTEROL SULFATE HFA 108 (90 BASE) MCG/ACT IN AERS
2.0000 | INHALATION_SPRAY | Freq: Four times a day (QID) | RESPIRATORY_TRACT | 2 refills | Status: DC | PRN
  Filled 2020-11-27: qty 18, 25d supply, fill #0
  Filled 2020-12-22: qty 18, 25d supply, fill #1
  Filled 2021-01-11: qty 18, 25d supply, fill #2

## 2020-11-27 NOTE — Telephone Encounter (Signed)
Requested medication (s) are due for refill today: yes  Requested medication (s) are on the active medication list: no  Last refill:  04/27/20  Future visit scheduled: yes  Notes to clinic:  medication was changed on 10/16/20. Please advise     Requested Prescriptions  Pending Prescriptions Disp Refills   fluticasone-salmeterol (ADVAIR) 250-50 MCG/ACT AEPB 60 each 10    Sig: Inhale 1 puff by mouth into the lungs twice daily.     Pulmonology:  Combination Products Passed - 11/27/2020  8:08 AM      Passed - Valid encounter within last 12 months    Recent Outpatient Visits           1 month ago COPD mixed type [facility])   [place], [place], MD   7 months ago History of prediabetes   [place], [place], [place]   1 year ago Essential hypertension   [place], [place], MD   1 year ago Prediabetes   [place], [place], [place]   2 years ago Essential hypertension   [place], MD       Future Appointments             In 1 week Charlott Rakes, MD [place]

## 2020-11-27 NOTE — Telephone Encounter (Signed)
Requested Prescriptions  Pending Prescriptions Disp Refills  . albuterol (VENTOLIN HFA) 108 (90 Base) MCG/ACT inhaler 18 g 2    Sig: Inhale 2 puffs into the lungs every 6 (six) hours as needed for wheezing or shortness of breath.     Pulmonology:  Beta Agonists Failed - 11/27/2020  8:06 AM      Failed - One inhaler should last at least one month. If the patient is requesting refills earlier, contact the patient to check for uncontrolled symptoms.      Passed - Valid encounter within last 12 months    Recent Outpatient Visits          1 month ago COPD mixed type [facility])   [place], [place], MD   7 months ago History of prediabetes   [place], Dionne [place], [place]   1 year ago Essential hypertension   [place], [place], MD   1 year ago Prediabetes   [place], [place], [place]   2 years ago Essential hypertension   [place], MD      Future Appointments            In 1 week Charlott Rakes, MD [place]

## 2020-11-29 ENCOUNTER — Other Ambulatory Visit: Payer: Self-pay

## 2020-12-04 ENCOUNTER — Ambulatory Visit: Payer: Self-pay | Attending: Family Medicine | Admitting: Family Medicine

## 2020-12-04 ENCOUNTER — Encounter: Payer: Self-pay | Admitting: Family Medicine

## 2020-12-04 ENCOUNTER — Other Ambulatory Visit: Payer: Self-pay

## 2020-12-04 VITALS — BP 168/96 | HR 90 | Ht 69.0 in | Wt 233.8 lb

## 2020-12-04 DIAGNOSIS — I10 Essential (primary) hypertension: Secondary | ICD-10-CM

## 2020-12-04 DIAGNOSIS — J449 Chronic obstructive pulmonary disease, unspecified: Secondary | ICD-10-CM

## 2020-12-04 NOTE — Patient Instructions (Signed)
Budesonide; Glycopyrrolate; Formoterol inhalation aerosol What is this medication? BUDESONIDE; GLYCOPYRROLATE; FORMOTEROL (byoo DES oh nide; glye koe PYE roe late; for [facility] te rol) inhalation is a combination of 3 medicines. Budesonide decreases inflammation in the lungs. Formoterol and glycopyrrolate are bronchodilators that help keep airways open. This medicine is used to treat chronic obstructive pulmonary disease (COPD). Do not use this drug combination for acute COPD attacks or bronchospasm. This medicine may be used for other purposes; ask your health care provider or pharmacist if you have questions. COMMON BRAND NAME(S): BREZTRI What should I tell my care team before I take this medication? They need to know if you have any of these conditions: bladder problems or trouble passing urine bone problems diabetes eye disease, vision problems heart disease high blood pressure history of irregular heartbeat immune system problems infection kidney disease pheochromocytoma prostate disease seizures thyroid disease an unusual or allergic reaction to budesonide, formoterol, glycopyrrolate, medicines, foods, dyes, or preservatives pregnant or trying to get pregnant breast-feeding How should I use this medication? This medicine is inhaled through the mouth. Follow the directions on the prescription label. Shake well before each use. Rinse your mouth with water after use. Make sure not to swallow the water. Take your medicine at regular intervals. Do not take your medicine more often than directed. Do not stop taking except on your doctor's advice. Make sure that you are using your inhaler correctly. This drug comes with INSTRUCTIONS FOR USE. Ask your pharmacist for directions on how to use this drug. Read the information carefully. Talk to your pharmacist or health care provider if you have questions. Talk to your pediatrician regarding the use of this medicine in children. This medicine is not  approved for use in children. Overdosage: If you think you have taken too much of this medicine contact a poison control center or emergency room at once. NOTE: This medicine is only for you. Do not share this medicine with others. What if I miss a dose? If you miss a dose, use it as soon as you can. If it is almost time for your next dose, use only that dose. Do not use double or extra doses. What may interact with this medication? Do not take the medicine with any of the following medications: cisapride dofetilide dronedarone MAOIs like Marplan, Nardil, and Parnate other medicines that contain long-acting beta-2 agonists (LABAs) like arformoterol, formoterol, indacaterol, olodaterol, salmeterol, vilanterol pimozide procarbazine thioridazine This medicine may also interact with the following medications: antihistamines for allergy, cough, and cold atropine certain antibiotics like clarithromycin, telithromycin certain antivirals for HIV or hepatitis certain heart medicines like atenolol, metoprolol certain medicines for bladder problems like oxybutynin, tolterodine certain medicines for blood pressure, heart disease, irregular heartbeat certain medicines for depression, anxiety, or psychotic disturbances certain medicines for fungal infections like ketoconazole, itraconazole certain medicines for Parkinson's disease like benztropine, trihexyphenidyl certain medicines for stomach problems like dicyclomine, hyoscyamine certain medicines for travel sickness like scopolamine diuretics grapefruit juice mifepristone other inhaled medicines that contain anticholinergics such as aclidinium, ipratropium, tiotropium, umeclidinium other medicines that prolong the QT interval (an abnormal heart rhythm) some vaccines steroid medicines like prednisone or cortisone stimulant medicines for attention disorders, weight loss, or to stay awake theophylline This list may not describe all possible  interactions. Give your health care provider a list of all the medicines, herbs, non-prescription drugs, or dietary supplements you use. Also tell them if you smoke, drink alcohol, or use illegal drugs. Some items may interact with your  medicine. What should I watch for while using this medication? Visit your health care professional for regular checks on your progress. Tell your health care professional if your symptoms do not start to get better or if they get worse. NEVER use this medicine for an acute asthma or COPD attack. You should use your short-acting rescue inhalers for this purpose. If your symptoms get worse or if you need your short-acting inhalers more often, call your doctor right away. This medicine may increase your risk of getting an infection. Tell your doctor or health care professional if you are around anyone with measles or chickenpox, or if you develop sores or blisters that do not heal properly. Do not get this medicine in your eyes. It can cause irritation, pain, or blurred vision. What side effects may I notice from receiving this medication? Side effects that you should report to your doctor or health care professional as soon as possible: allergic reactions like skin rash, itching or hives, swelling of the face, lips, or tongue anxious breathing problems changes in vision, eye pain muscle cramps or muscle pain signs and symptoms of a dangerous change in heartbeat or heart rhythm like chest pain; dizziness; fast or irregular heartbeat; palpitations; feeling faint or lightheaded, falls; breathing problems signs and symptoms of high blood sugar such as being more thirsty or hungry or having to urinate more than normal. You may also feel very tired or have blurry vision signs and symptoms of infection like fever; chills; cough; sore throat; pain or trouble passing urine tremors trouble passing urine or change in the amount of urine unusually weak or tired white patches in  the mouth or mouth sores Side effects that usually do not require medical attention (report these to your doctor or health care professional if they continue or are bothersome): back pain changes in taste cough diarrhea runny or stuffy nose upset stomach This list may not describe all possible side effects. Call your doctor for medical advice about side effects. You may report side effects to FDA at 1-800-FDA-1088. Where should I keep my medication? Keep out of the reach of children. Store in a dry place at room temperature between 15 and 30 degrees C (59 and 86 degrees F). Protect from heat. Do not freeze. Do not use or store near heat or flame, as the canister may burst. Throw away the inhaler 3 months after you open the foil pouch (for the 120-inhalation canister), or 3 weeks after you open the foil pouch (for the 28-inhalation canister), or when the dose indicator reaches zero "0", whichever comes first. NOTE: This sheet is a summary. It may not cover all possible information. If you have questions about this medicine, talk to your doctor, pharmacist, or health care provider.  2022 Elsevier/Gold Standard (2018-08-17 00:00:00)

## 2020-12-04 NOTE — Progress Notes (Signed)
Subjective:  Patient ID: [name], male    DOB: November 16, 1955  Age: 65 y.o. MRN: [id number]  CC: Hypertension   HPI [name] is a 65 y.o. year old male with a history of hypertension, hyperlipidemia, COPD here for follow-up visit.  Interval History: At his last visit he was placed switch from [facility] to [facility] due to uncontrolled COPD and frequent use of his rescue inhalers. He does not think there has any improvement with Breztri. On further questioning he uses his Breztri 1 puff 3 times a day as needed and informs me he never read instruction.  He is also continuing to use his MDI excessively. His blood pressure is elevated and he is nonadherent with his antihypertensive. Denies additional concerns today. Past Medical History:  Diagnosis Date   Asthma    COPD (chronic obstructive pulmonary disease) ([place])    Hx of adenomatous polyp of colon 01/13/2020   Hypertension     Past Surgical History:  Procedure Laterality Date   HAND SURGERY Bilateral left 2130,[id number] Rt    Family History  Problem Relation Age of Onset   Hypertension Paternal Uncle    Cancer Maternal Grandmother    Hypertension Maternal Grandfather    Colon cancer Cousin    Colon polyps Neg Hx    Esophageal cancer Neg Hx    Stomach cancer Neg Hx    Rectal cancer Neg Hx     Allergies  Allergen Reactions   Codeine Itching   Penicillins Itching and Other (See Comments)    Has patient had a PCN reaction causing immediate rash, facial/tongue/throat swelling, SOB or lightheadedness with hypotension: No Has patient had a PCN reaction causing severe rash involving mucus membranes or skin necrosis: No Has patient had a PCN reaction that required hospitalization No Has patient had a PCN reaction occurring within the last 10 years: No If all of the above answers are "NO", then may proceed with Cephalosporin use.    Outpatient Medications Prior to Visit  Medication Sig Dispense Refill   albuterol  (VENTOLIN HFA) 108 (90 Base) MCG/ACT inhaler Inhale 2 puffs into the lungs every 6 (six) hours as needed for wheezing or shortness of breath. 18 g 2   amLODipine (NORVASC) 10 MG tablet Take 1 tablet (10 mg total) by mouth daily. To lower blood pressure 90 tablet 1   Budeson-Glycopyrrol-Formoterol (BREZTRI AEROSPHERE) 160-9-4.8 MCG/ACT AERO Inhale 2 puffs into the lungs 2 (two) times daily. 10.7 g 6   montelukast (SINGULAIR) 10 MG tablet TAKE 1 TABLET (10 MG TOTAL) BY MOUTH AT BEDTIME. 90 tablet 3   polyethylene glycol-electrolytes (NULYTELY) 420 g solution TAKE 4,000 MLS BY MOUTH ONCE FOR 1 DOSE. 4000 mL 0   pravastatin (PRAVACHOL) 10 MG tablet TAKE 1 TABLET BY MOUTH EVERY EVENING TO HELP LOWER CHOLESTEROL 90 tablet 1   ipratropium-albuterol (DUONEB) 0.5-2.5 (3) MG/3ML SOLN TAKE 3 MLS BY NEBULIZATION EVERY 6 (SIX) HOURS AS NEEDED (SEVERE WHEEZING). 360 mL 3   No facility-administered medications prior to visit.     ROS Review of Systems  Constitutional:  Negative for activity change and appetite change.  HENT:  Negative for sinus pressure and sore throat.   Eyes:  Negative for visual disturbance.  Respiratory:  Positive for cough and shortness of breath. Negative for chest tightness.   Cardiovascular:  Negative for chest pain and leg swelling.  Gastrointestinal:  Negative for abdominal distention, abdominal pain, constipation and diarrhea.  Endocrine: Negative.   Genitourinary:  Negative for  dysuria.  Musculoskeletal:  Negative for joint swelling and myalgias.  Skin:  Negative for rash.  Allergic/Immunologic: Negative.   Neurological:  Negative for weakness, light-headedness and numbness.  Psychiatric/Behavioral:  Negative for dysphoric mood and suicidal ideas.    Objective:  BP (!) 168/96   Pulse 90   Ht 5\' 9"  (1.753 m)   Wt 233 lb 12.8 oz (106.1 kg)   SpO2 97%   BMI 34.53 kg/m   BP/Weight 12/04/2020 10/16/2020 32/99/2426  Systolic BP 834 196 222  Diastolic BP 96 98 85  Wt.  (Lbs) 233.8 234.4 240  BMI 34.53 34.61 35.44      Physical Exam Constitutional:      Appearance: He is well-developed.  Cardiovascular:     Rate and Rhythm: Normal rate.     Heart sounds: Normal heart sounds. No murmur heard. Pulmonary:     Effort: Pulmonary effort is normal.     Breath sounds: Normal breath sounds. No wheezing or rales.  Chest:     Chest wall: No tenderness.  Abdominal:     General: Bowel sounds are normal. There is no distension.     Palpations: Abdomen is soft. There is no mass.     Tenderness: There is no abdominal tenderness.  Musculoskeletal:        General: Normal range of motion.     Right lower leg: No edema.     Left lower leg: No edema.  Neurological:     Mental Status: He is alert and oriented to person, place, and time.  Psychiatric:        Mood and Affect: Mood normal.    CMP Latest Ref Rng & Units 04/27/2020 07/08/2019 12/18/2017  Glucose 65 - 99 mg/dL 145(H) 94 97  BUN 8 - 27 mg/dL 6(L) 8 7(L)  Creatinine 0.76 - 1.27 mg/dL 0.87 0.94 0.91  Sodium 134 - 144 mmol/L 141 140 139  Potassium 3.5 - 5.2 mmol/L 3.8 4.1 3.8  Chloride 96 - 106 mmol/L 105 101 100  CO2 20 - 29 mmol/L 23 28 27   Calcium 8.6 - 10.2 mg/dL 8.5(L) 9.1 9.3  Total Protein 6.0 - 8.5 g/dL 6.1 6.5 6.5  Total Bilirubin 0.0 - 1.2 mg/dL 0.7 0.5 0.4  Alkaline Phos 44 - 121 IU/L 100 106 97  AST 0 - 40 IU/L 24 16 18   ALT 0 - 44 IU/L 20 13 16     Lipid Panel     Component Value Date/Time   CHOL 173 04/27/2020 1659   TRIG 64 04/27/2020 1659   HDL 43 04/27/2020 1659   CHOLHDL 4.0 04/27/2020 1659   CHOLHDL 4.8 07/15/2013 1152   VLDL 19 07/15/2013 1152   LDLCALC 118 (H) 04/27/2020 1659    CBC    Component Value Date/Time   WBC 4.5 07/08/2019 0910   WBC 5.8 06/11/2017 1630   RBC 4.67 07/08/2019 0910   RBC 4.70 06/11/2017 1630   HGB 14.6 07/08/2019 0910   HCT 44.0 07/08/2019 0910   PLT 169 07/08/2019 0910   MCV 94 07/08/2019 0910   MCH 31.3 07/08/2019 0910   MCH 30.9  06/11/2017 1630   MCHC 33.2 07/08/2019 0910   MCHC 33.2 06/11/2017 1630   RDW 13.4 07/08/2019 0910   LYMPHSABS 1.7 07/08/2019 0910   MONOABS 1.1 (H) 06/11/2017 1630   EOSABS 0.2 07/08/2019 0910   BASOSABS 0.0 07/08/2019 0910    Lab Results  Component Value Date   HGBA1C 5.9 (H) 04/27/2020    Assessment &  Plan:  1. COPD mixed type ([place]) Uncontrolled due to confusion regarding administration instructions I have clarified administration instructions and the fact that Judithann Sauger should be used for maintenance and he will use albuterol MDI as needed Will reassess at next visit We will also obtain PFTs If symptoms persist consider referring to pulmonary - Pulmonary function test; Future  2. Essential hypertension Uncontrolled Medication nonadherence a major factor He will follow-up with the clinical pharmacist and has been advised to take his amlodipine prior to that visit Regimen will be adjusted at clinical pharmacy visit Counseled on blood pressure goal of less than 130/80, low-sodium, DASH diet, medication compliance, 150 minutes of moderate intensity exercise per week. Discussed medication compliance, adverse effects.   Health Care Maintenance: Declines pneumonia vaccine. No orders of the defined types were placed in this encounter.   Follow-up: Return in about 2 weeks (around 12/18/2020) for Blood pressure follow-up with Lurena Joiner; 3 months with PCP.       Charlott Rakes, MD, FAAFP. [facility], [place]   12/04/2020, 10:37 AM

## 2020-12-22 ENCOUNTER — Encounter: Payer: Self-pay | Admitting: Pharmacist

## 2020-12-22 ENCOUNTER — Other Ambulatory Visit: Payer: Self-pay

## 2020-12-22 ENCOUNTER — Other Ambulatory Visit: Payer: Self-pay | Admitting: Pharmacist

## 2020-12-22 ENCOUNTER — Ambulatory Visit: Payer: Self-pay | Attending: Family Medicine | Admitting: Pharmacist

## 2020-12-22 VITALS — BP 145/79 | HR 92

## 2020-12-22 DIAGNOSIS — I10 Essential (primary) hypertension: Secondary | ICD-10-CM

## 2020-12-22 MED ORDER — VALSARTAN 40 MG PO TABS
40.0000 mg | ORAL_TABLET | Freq: Every day | ORAL | 2 refills | Status: DC
  Filled 2020-12-22: qty 60, 60d supply, fill #0

## 2020-12-22 MED ORDER — VALSARTAN 80 MG PO TABS
80.0000 mg | ORAL_TABLET | Freq: Every day | ORAL | 3 refills | Status: DC
Start: 2020-12-22 — End: 2020-12-22
  Filled 2020-12-22 (×2): qty 30, 30d supply, fill #0

## 2020-12-22 MED ORDER — VALSARTAN 40 MG PO TABS
80.0000 mg | ORAL_TABLET | Freq: Every day | ORAL | 2 refills | Status: DC
  Filled 2020-12-22 – 2021-01-03 (×2): qty 60, 30d supply, fill #0

## 2020-12-22 NOTE — Progress Notes (Signed)
   S:    Patient arrives in good spirits.    Presents to the clinic for hypertension evaluation, counseling, and management. Patient was referred and last seen by Primary Care Provider on 12/04/2020. BP was elevated, however, pt admitted to non-compliance.    Medication adherence denied. Does not take the amlodipine. Tells me this makes him feel dizzy and sometimes feels like he's going to black out.   Current BP Medications include:  amlodipine 10 mg daily   Dietary habits include: tries to limit sodium. Admits to drinking sodas regularly.  Exercise habits include: none Family / Social history:  -Fhx: HTN -Tobacco: current 1 PPD smoker  - Alcohol: 1 drink standard weekly     O:  Vitals:   12/22/20 0851  BP: (!) 145/79  Pulse: 92   Home BP readings: none  Last 3 Office BP readings: BP Readings from Last 3 Encounters:  12/04/20 (!) 168/96  10/16/20 (!) 170/98  01/10/20 (!) 146/85    BMET    Component Value Date/Time   NA 141 04/27/2020 1659   K 3.8 04/27/2020 1659   CL 105 04/27/2020 1659   CO2 23 04/27/2020 1659   GLUCOSE 145 (H) 04/27/2020 1659   GLUCOSE 170 (H) 06/12/2017 0402   BUN 6 (L) 04/27/2020 1659   CREATININE 0.87 04/27/2020 1659   CREATININE 1.05 03/12/2016 1500   CALCIUM 8.5 (L) 04/27/2020 1659   GFRNONAA 87 07/08/2019 0910   GFRNONAA 77 03/12/2016 1500   GFRAA 100 07/08/2019 0910   GFRAA 89 03/12/2016 1500    Renal function: CrCl cannot be calculated (Patient's most recent lab result is older than the maximum 21 days allowed.).  Clinical ASCVD: No  The 10-year ASCVD risk score (Arnett DK, et al., 2019) is: 38.9%   Values used to calculate the score:     Age: 32 years     Sex: Male     Is Non-Hispanic African American: Yes     Diabetic: No     Tobacco smoker: Yes     Systolic Blood Pressure: 545 mmHg     Is BP treated: Yes     HDL Cholesterol: 43 mg/dL     Total Cholesterol: 173 mg/dL    A/P: Hypertension longstanding currently  uncontrolled on current medications. BP Goal = < 130/80 mmHg. Medication adherence denied. Will stop amlodipine and try low dose valsartan. Will plan on 1 month follow-up to recheck renal function and electrolyte status.  -Discontinue amlodipine. -Start valsartan 80 mg daily.   -F/u labs ordered - CMP14+eGFR in 1 month. -Counseled on lifestyle modifications for blood pressure control including reduced dietary sodium, increased exercise, adequate sleep.  Results reviewed and written information provided.   Total time in face-to-face counseling 30 minutes.   F/U Clinic Visit in 1 month.   Benard Halsted, PharmD, [place], [place] [phone]

## 2020-12-25 ENCOUNTER — Other Ambulatory Visit: Payer: Self-pay

## 2021-01-01 ENCOUNTER — Other Ambulatory Visit: Payer: Self-pay

## 2021-01-03 ENCOUNTER — Other Ambulatory Visit: Payer: Self-pay

## 2021-01-11 ENCOUNTER — Other Ambulatory Visit: Payer: Self-pay

## 2021-01-12 ENCOUNTER — Emergency Department (HOSPITAL_COMMUNITY): Payer: No Typology Code available for payment source

## 2021-01-12 ENCOUNTER — Other Ambulatory Visit: Payer: Self-pay

## 2021-01-12 ENCOUNTER — Encounter (HOSPITAL_COMMUNITY): Payer: Self-pay | Admitting: Emergency Medicine

## 2021-01-12 ENCOUNTER — Emergency Department (HOSPITAL_COMMUNITY)
Admission: EM | Admit: 2021-01-12 | Discharge: 2021-01-13 | Disposition: A | Discharge status: Home / Self Care | Payer: No Typology Code available for payment source | Attending: Emergency Medicine | Admitting: Emergency Medicine

## 2021-01-12 DIAGNOSIS — R220 Localized swelling, mass and lump, head: Secondary | ICD-10-CM | POA: Insufficient documentation

## 2021-01-12 DIAGNOSIS — Z20822 Contact with and (suspected) exposure to covid-19: Secondary | ICD-10-CM | POA: Insufficient documentation

## 2021-01-12 DIAGNOSIS — S32018A Other fracture of first lumbar vertebra, initial encounter for closed fracture: Secondary | ICD-10-CM | POA: Insufficient documentation

## 2021-01-12 DIAGNOSIS — J45909 Unspecified asthma, uncomplicated: Secondary | ICD-10-CM | POA: Insufficient documentation

## 2021-01-12 DIAGNOSIS — S32009A Unspecified fracture of unspecified lumbar vertebra, initial encounter for closed fracture: Secondary | ICD-10-CM

## 2021-01-12 DIAGNOSIS — S2232XA Fracture of one rib, left side, initial encounter for closed fracture: Secondary | ICD-10-CM | POA: Insufficient documentation

## 2021-01-12 DIAGNOSIS — I10 Essential (primary) hypertension: Secondary | ICD-10-CM | POA: Diagnosis not present

## 2021-01-12 DIAGNOSIS — Z79899 Other long term (current) drug therapy: Secondary | ICD-10-CM | POA: Diagnosis not present

## 2021-01-12 DIAGNOSIS — S299XXA Unspecified injury of thorax, initial encounter: Secondary | ICD-10-CM | POA: Diagnosis present

## 2021-01-12 DIAGNOSIS — F17210 Nicotine dependence, cigarettes, uncomplicated: Secondary | ICD-10-CM | POA: Diagnosis not present

## 2021-01-12 DIAGNOSIS — S32028A Other fracture of second lumbar vertebra, initial encounter for closed fracture: Secondary | ICD-10-CM | POA: Insufficient documentation

## 2021-01-12 DIAGNOSIS — J449 Chronic obstructive pulmonary disease, unspecified: Secondary | ICD-10-CM | POA: Diagnosis not present

## 2021-01-12 DIAGNOSIS — Z7951 Long term (current) use of inhaled steroids: Secondary | ICD-10-CM | POA: Diagnosis not present

## 2021-01-12 DIAGNOSIS — S04012A Injury of optic nerve, left eye, initial encounter: Secondary | ICD-10-CM

## 2021-01-12 LAB — RESP PANEL BY RT-PCR (FLU A&B, COVID) ARPGX2
Influenza A by PCR: NEGATIVE
Influenza B by PCR: NEGATIVE
SARS Coronavirus 2 by RT PCR: NEGATIVE

## 2021-01-12 MED ORDER — ALBUTEROL SULFATE HFA 108 (90 BASE) MCG/ACT IN AERS
2.0000 | INHALATION_SPRAY | RESPIRATORY_TRACT | Status: DC | PRN
  Administered 2021-01-12: 16:00:00 2 via RESPIRATORY_TRACT
  Filled 2021-01-12: qty 6.7

## 2021-01-12 MED ORDER — ONDANSETRON HCL 4 MG/2ML IJ SOLN
4.0000 mg | Freq: Once | INTRAMUSCULAR | Status: AC
  Administered 2021-01-12: 16:00:00 4 mg via INTRAVENOUS
  Filled 2021-01-12: qty 2

## 2021-01-12 MED ORDER — IPRATROPIUM-ALBUTEROL 0.5-2.5 (3) MG/3ML IN SOLN: 3.0000 mL | Freq: Once | RESPIRATORY_TRACT | Status: DC

## 2021-01-12 MED ORDER — HYDROMORPHONE HCL 1 MG/ML IJ SOLN
1.0000 mg | Freq: Once | INTRAMUSCULAR | Status: AC
  Administered 2021-01-12: 15:00:00 1 mg via INTRAVENOUS
  Filled 2021-01-12: qty 1

## 2021-01-12 MED ORDER — HYDROMORPHONE HCL 2 MG PO TABS
2.0000 mg | ORAL_TABLET | ORAL | 0 refills | Status: DC | PRN
Start: 2021-01-12 — End: 2021-01-12

## 2021-01-12 MED ORDER — HYDROMORPHONE HCL 2 MG PO TABS: 2.0000 mg | ORAL_TABLET | ORAL | 0 refills | Status: DC | PRN

## 2021-01-12 MED ORDER — TETRACAINE HCL 0.5 % OP SOLN
2.0000 [drp] | Freq: Once | OPHTHALMIC | Status: DC
  Filled 2021-01-12: qty 4
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
## 2021-01-12 NOTE — Discharge Instructions (Addendum)
Be sure to apply ice to your left eye and follow-up with Dr. Katy Fitch beginning next week.  If you develop numbness or weakness in the legs, incontinence of her bowels or bladders, or any other new/concerning symptoms then return to the ER for evaluation.

## 2021-01-12 NOTE — Progress Notes (Signed)
Orthopedic Tech Progress Note Patient Details:  [name] 1955-02-21 [id number]  Patient ID: [name], male   DOB: 1955-09-01, 65 y.o.   MRN: [id number] Placed stat order with [facility] for LSO Clamshell brace.  Vernona Rieger 01/12/2021, 9:45 PM
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
## 2021-01-17 ENCOUNTER — Other Ambulatory Visit: Payer: Self-pay

## 2021-01-23 ENCOUNTER — Ambulatory Visit: Payer: Self-pay | Admitting: Pharmacist

## 2021-02-01 ENCOUNTER — Other Ambulatory Visit: Payer: Self-pay | Admitting: Family Medicine

## 2021-02-01 ENCOUNTER — Other Ambulatory Visit: Payer: Self-pay | Admitting: Physician Assistant

## 2021-02-01 ENCOUNTER — Other Ambulatory Visit: Payer: Self-pay

## 2021-02-01 DIAGNOSIS — J449 Chronic obstructive pulmonary disease, unspecified: Secondary | ICD-10-CM

## 2021-02-01 MED ORDER — ALBUTEROL SULFATE HFA 108 (90 BASE) MCG/ACT IN AERS
2.0000 | INHALATION_SPRAY | Freq: Four times a day (QID) | RESPIRATORY_TRACT | 0 refills | Status: DC | PRN
  Filled 2021-02-01: qty 18, 25d supply, fill #0

## 2021-02-01 MED ORDER — IPRATROPIUM-ALBUTEROL 0.5-2.5 (3) MG/3ML IN SOLN
RESPIRATORY_TRACT | 3 refills | Status: DC
  Filled 2021-02-01: qty 360, 30d supply, fill #0

## 2021-02-01 NOTE — Telephone Encounter (Signed)
Requested medication (s) are due for refill today: see note, spiriva D/C  Requested medication (s) are on the active medication list: yes duoneb.  Last refill:  07/08/19-02/01/21 #360 ml 3 refills  Future visit scheduled: yes in 1 month  Notes to clinic: do you want to refill duoneb ?  Spiriva discontinued 10/16/20     Requested Prescriptions  Pending Prescriptions Disp Refills   tiotropium (SPIRIVA HANDIHALER) 18 MCG inhalation capsule 30 capsule 3    Sig: PLACE 1 CAPSULE (18 MCG TOTAL) INTO INHALER AND INHALE DAILY.     Pulmonology:  Anticholinergic Agents Passed - 02/01/2021 11:24 AM      Passed - Valid encounter within last 12 months    Recent Outpatient Visits           1 month ago Essential hypertension   [place], Jarome Matin, RPH-CPP   1 month ago COPD mixed type [facility]   [place], [place], MD   3 months ago COPD mixed type [facility])   [place], Enobong, MD   9 months ago History of prediabetes   [place], Dionne Bucy, [place]   1 year ago Essential hypertension   [place], MD       Future Appointments             In 1 week Daisy [place]oss[place], Jarome Matin, [place]   In 1 month [place], [place], MD [place]             ipratropium-albuterol (DUONEB) 0.5-2.5 (3) MG/3ML SOLN 360 mL 3    Sig: TAKE 3 MLS BY NEBULIZATION EVERY 6 (SIX) HOURS AS NEEDED (SEVERE WHEEZING).     Pulmonology:  Combination Products Passed - 02/01/2021 11:24 AM      Passed - Valid encounter within last 12 months    Recent Outpatient Visits           1 month ago Essential hypertension   [place], Jarome Matin, RPH-CPP   1 month ago COPD mixed type [facility])   [place], [place], MD   3 months ago COPD mixed type [facility])   [place], Enobong, MD   9 months ago History of prediabetes   [place], Dionne Bucy, [place]   1 year ago Essential hypertension   [place], MD       Future Appointments             In 1 week Daisy Blossom, Jarome Matin, [place]   In 1 month Charlott Rakes, MD [place]

## 2021-02-01 NOTE — Telephone Encounter (Signed)
Requested Prescriptions  Pending Prescriptions Disp Refills   albuterol (VENTOLIN HFA) 108 (90 Base) MCG/ACT inhaler 18 g 0    Sig: Inhale 2 puffs into the lungs every 6 (six) hours as needed for wheezing or shortness of breath.     Pulmonology:  Beta Agonists Failed - 02/01/2021 11:23 AM      Failed - One inhaler should last at least one month. If the patient is requesting refills earlier, contact the patient to check for uncontrolled symptoms.      Passed - Valid encounter within last 12 months    Recent Outpatient Visits          1 month ago Essential hypertension   [place], Jarome Matin, RPH-CPP   1 month ago COPD mixed type [facility])   [place], En[place]ong, MD   3 months ago COPD mixed type [facility])   [place], Enobong, MD   9 months ago History of prediabetes   [place], Dionne Bucy, [place]   1 year ago Essential hypertension   [place], MD      Future Appointments            In 1 week Daisy Blossom, Jarome Matin, [place]   In 1 month Charlott Rakes, MD [place]

## 2021-02-02 ENCOUNTER — Other Ambulatory Visit: Payer: Self-pay

## 2021-02-05 ENCOUNTER — Other Ambulatory Visit: Payer: Self-pay

## 2021-02-07 ENCOUNTER — Other Ambulatory Visit: Payer: Self-pay

## 2021-02-09 ENCOUNTER — Other Ambulatory Visit: Payer: Self-pay

## 2021-02-12 ENCOUNTER — Ambulatory Visit: Payer: Self-pay | Admitting: Pharmacist

## 2021-02-16 ENCOUNTER — Other Ambulatory Visit: Payer: Self-pay

## 2021-03-06 ENCOUNTER — Ambulatory Visit: Payer: Self-pay | Admitting: Family Medicine

## 2021-03-16 ENCOUNTER — Other Ambulatory Visit: Payer: Self-pay | Admitting: Family Medicine

## 2021-03-16 ENCOUNTER — Other Ambulatory Visit: Payer: Self-pay

## 2021-03-16 DIAGNOSIS — J449 Chronic obstructive pulmonary disease, unspecified: Secondary | ICD-10-CM

## 2021-03-16 MED ORDER — ALBUTEROL SULFATE HFA 108 (90 BASE) MCG/ACT IN AERS
2.0000 | INHALATION_SPRAY | Freq: Four times a day (QID) | RESPIRATORY_TRACT | 0 refills | Status: DC | PRN
  Filled 2021-03-16: qty 18, 25d supply, fill #0

## 2021-04-30 ENCOUNTER — Other Ambulatory Visit: Payer: Self-pay | Admitting: Family Medicine

## 2021-04-30 ENCOUNTER — Other Ambulatory Visit: Payer: Self-pay

## 2021-04-30 DIAGNOSIS — J449 Chronic obstructive pulmonary disease, unspecified: Secondary | ICD-10-CM

## 2021-04-30 MED ORDER — ALBUTEROL SULFATE HFA 108 (90 BASE) MCG/ACT IN AERS
2.0000 | INHALATION_SPRAY | Freq: Four times a day (QID) | RESPIRATORY_TRACT | 2 refills | Status: DC | PRN
  Filled 2021-04-30: qty 18, 25d supply, fill #0
  Filled 2021-06-04: qty 18, 25d supply, fill #1
  Filled 2021-07-06: qty 18, 25d supply, fill #2

## 2021-06-04 ENCOUNTER — Other Ambulatory Visit: Payer: Self-pay

## 2021-06-05 ENCOUNTER — Other Ambulatory Visit: Payer: Self-pay

## 2021-06-19 ENCOUNTER — Other Ambulatory Visit: Payer: Self-pay

## 2021-06-19 ENCOUNTER — Encounter: Payer: Self-pay | Admitting: Family Medicine

## 2021-06-19 ENCOUNTER — Ambulatory Visit: Payer: Self-pay | Attending: Family Medicine | Admitting: Family Medicine

## 2021-06-19 VITALS — BP 166/83 | HR 84 | Ht 69.0 in | Wt 223.8 lb

## 2021-06-19 DIAGNOSIS — Z9189 Other specified personal risk factors, not elsewhere classified: Secondary | ICD-10-CM

## 2021-06-19 DIAGNOSIS — J449 Chronic obstructive pulmonary disease, unspecified: Secondary | ICD-10-CM

## 2021-06-19 DIAGNOSIS — I10 Essential (primary) hypertension: Secondary | ICD-10-CM

## 2021-06-19 DIAGNOSIS — F17210 Nicotine dependence, cigarettes, uncomplicated: Secondary | ICD-10-CM

## 2021-06-19 MED ORDER — VALSARTAN 80 MG PO TABS
80.0000 mg | ORAL_TABLET | Freq: Every day | ORAL | 1 refills | Status: DC
  Filled 2021-06-19: qty 90, 90d supply, fill #0

## 2021-06-19 MED ORDER — ATORVASTATIN CALCIUM 20 MG PO TABS
20.0000 mg | ORAL_TABLET | Freq: Every day | ORAL | 1 refills | Status: DC
Start: 2021-06-19 — End: 2021-07-05
  Filled 2021-06-19: qty 30, 30d supply, fill #0

## 2021-06-19 MED ORDER — AMLODIPINE BESYLATE 10 MG PO TABS
10.0000 mg | ORAL_TABLET | Freq: Every day | ORAL | 1 refills | Status: DC
  Filled 2021-06-19: qty 30, 30d supply, fill #0

## 2021-06-19 NOTE — Patient Instructions (Signed)

## 2021-06-19 NOTE — Progress Notes (Signed)
Subjective:  Patient ID: [name], male    DOB: 08-23-1955  Age: 66 y.o. MRN: [id number]  CC: Hypertension   HPI [name] is a 66 y.o. year old male with a history of  hypertension, hyperlipidemia, COPD, tobacco dependence (greater than 40-pack-year) here for follow-up visit.  Interval History: He states he forgets to take his medications and his BP is elevated. This has been the same excuse he always has at his office visits. With regards to his COPD he has been adherent with his Breztri and albuterol and has noticed improvement in COPD symptoms. He continues to smoke and states he would like to work on quitting on his own but declines pharmacotherapy. Denies additional concerns today. Past Medical History:  Diagnosis Date   Asthma    COPD (chronic obstructive pulmonary disease) ([place])    Hx of adenomatous polyp of colon 01/13/2020   Hypertension     Past Surgical History:  Procedure Laterality Date   HAND SURGERY Bilateral left [id number] Rt    Family History  Problem Relation Age of Onset   Hypertension Paternal Uncle    Cancer Maternal Grandmother    Hypertension Maternal Grandfather    Colon cancer Cousin    Colon polyps Neg Hx    Esophageal cancer Neg Hx    Stomach cancer Neg Hx    Rectal cancer Neg Hx     Social History   Socioeconomic History   Marital status: Married    Spouse name: Not on file   Number of children: Not on file   Years of education: Not on file   Highest education level: Not on file  Occupational History   Not on file  Tobacco Use   Smoking status: Some Days    Packs/day: 1.00    Years: 45.00    Pack years: 45.00    Types: Cigarettes   Smokeless tobacco: Never  Vaping Use   Vaping Use: Never used  Substance and Sexual Activity   Alcohol use: Yes    Alcohol/week: 1.0 standard drink    Types: 1 Standard drinks or equivalent per week    Comment: "every once in awhile"-3 times a month per pt   Drug use: Yes     Frequency: 1.0 times per week    Types: Marijuana    Comment: occasionally marijuana-3 times a month per pt   Sexual activity: Not on file  Other Topics Concern   Not on file  Social History Narrative   Not on file   Social Determinants of Health   Financial Resource Strain: Not on file  Food Insecurity: Not on file  Transportation Needs: Not on file  Physical Activity: Not on file  Stress: Not on file  Social Connections: Not on file    Allergies  Allergen Reactions   Codeine Itching   Penicillins Itching and Other (See Comments)    Has patient had a PCN reaction causing immediate rash, facial/tongue/throat swelling, SOB or lightheadedness with hypotension: No Has patient had a PCN reaction causing severe rash involving mucus membranes or skin necrosis: No Has patient had a PCN reaction that required hospitalization No Has patient had a PCN reaction occurring within the last 10 years: No If all of the above answers are "NO", then may proceed with Cephalosporin use.    Outpatient Medications Prior to Visit  Medication Sig Dispense Refill   albuterol (VENTOLIN HFA) 108 (90 Base) MCG/ACT inhaler Inhale 2 puffs into the lungs every 6 (six)  hours as needed for wheezing or shortness of breath. 18 g 2   Budeson-Glycopyrrol-Formoterol (BREZTRI AEROSPHERE) 160-9-4.8 MCG/ACT AERO Inhale 2 puffs into the lungs 2 (two) times daily. 10.7 g 6   ipratropium-albuterol (DUONEB) 0.5-2.5 (3) MG/3ML SOLN TAKE 3 MLS BY NEBULIZATION EVERY 6 (SIX) HOURS AS NEEDED (SEVERE WHEEZING). 360 mL 3   HYDROmorphone (DILAUDID) 2 MG tablet Take 1-2 tablets (2-4 mg total) by mouth every 4 (four) hours as needed for severe pain. (Patient not taking: Reported on 06/19/2021) 15 tablet 0   montelukast (SINGULAIR) 10 MG tablet TAKE 1 TABLET (10 MG TOTAL) BY MOUTH AT BEDTIME. 90 tablet 3   amLODipine (NORVASC) 10 MG tablet Take 1 tablet (10 mg total) by mouth daily. To lower blood pressure (Patient not taking: Reported  on 06/19/2021) 90 tablet 1   pravastatin (PRAVACHOL) 10 MG tablet TAKE 1 TABLET BY MOUTH EVERY EVENING TO HELP LOWER CHOLESTEROL 90 tablet 1   valsartan (DIOVAN) 40 MG tablet Take 2 tablets (80 mg total) by mouth daily. (Patient not taking: Reported on 06/19/2021) 60 tablet 2   No facility-administered medications prior to visit.     ROS Review of Systems  Constitutional:  Negative for activity change and appetite change.  HENT:  Negative for sinus pressure and sore throat.   Respiratory:  Negative for chest tightness, shortness of breath and wheezing.   Cardiovascular:  Negative for chest pain and palpitations.  Gastrointestinal:  Negative for abdominal distention, abdominal pain and constipation.  Genitourinary: Negative.   Musculoskeletal: Negative.   Psychiatric/Behavioral:  Negative for behavioral problems and dysphoric mood.    Objective:  BP (!) 166/83   Pulse 84   Ht _0  (1.753 m)   Wt 223 lb 12.8 oz (101.5 kg)   SpO2 97%   BMI 33.05 kg/m      06/19/2021    8:43 AM 01/12/2021    9:43 PM 01/12/2021    9:37 PM  BP/Weight  Systolic BP 546 270   Diastolic BP 83 76   Wt. (Lbs) 223.8  240  BMI 33.05 kg/m2  35.44 kg/m2      Physical Exam Constitutional:      Appearance: He is well-developed.  Cardiovascular:     Rate and Rhythm: Normal rate.     Heart sounds: Normal heart sounds. No murmur heard. Pulmonary:     Effort: Pulmonary effort is normal.     Breath sounds: Normal breath sounds. No wheezing or rales.  Chest:     Chest wall: No tenderness.  Abdominal:     General: Bowel sounds are normal. There is no distension.     Palpations: Abdomen is soft. There is no mass.     Tenderness: There is no abdominal tenderness.  Musculoskeletal:        General: Normal range of motion.     Right lower leg: No edema.     Left lower leg: No edema.  Neurological:     Mental Status: He is alert and oriented to person, place, and time.  Psychiatric:        Mood and  Affect: Mood normal.       Latest Ref Rng & Units 04/27/2020    4:59 PM 07/08/2019    9:10 AM 12/18/2017   11:32 AM  CMP  Glucose 65 - 99 mg/dL 145   94   97    BUN 8 - 27 mg/dL _1 Creatinine 0.76 -  1.27 mg/dL 0.87   0.94   0.91    Sodium 134 - 144 mmol/L 141   140   139    Potassium 3.5 - 5.2 mmol/L 3.8   4.1   3.8    Chloride 96 - 106 mmol/L 105   101   100    CO2 20 - 29 mmol/L _0 Calcium 8.6 - 10.2 mg/dL 8.5   9.1   9.3    Total Protein 6.0 - 8.5 g/dL 6.1   6.5   6.5    Total Bilirubin 0.0 - 1.2 mg/dL 0.7   0.5   0.4    Alkaline Phos 44 - 121 IU/L 100   106   97    AST 0 - 40 IU/L _1 ALT 0 - 44 IU/L _2 Lipid Panel     Component Value Date/Time   CHOL 173 04/27/2020 1659   TRIG 64 04/27/2020 1659   HDL 43 04/27/2020 1659   CHOLHDL 4.0 04/27/2020 1659   CHOLHDL 4.8 07/15/2013 1152   VLDL 19 07/15/2013 1152   LDLCALC 118 (H) 04/27/2020 1659    CBC    Component Value Date/Time   WBC 4.5 07/08/2019 0910   WBC 5.8 06/11/2017 1630   RBC 4.67 07/08/2019 0910   RBC 4.70 06/11/2017 1630   HGB 14.6 07/08/2019 0910   HCT 44.0 07/08/2019 0910   PLT 169 07/08/2019 0910   MCV 94 07/08/2019 0910   MCH 31.3 07/08/2019 0910   MCH 30.9 06/11/2017 1630   MCHC 33.2 07/08/2019 0910   MCHC 33.2 06/11/2017 1630   RDW 13.4 07/08/2019 0910   LYMPHSABS 1.7 07/08/2019 0910   MONOABS 1.1 (H) 06/11/2017 1630   EOSABS 0.2 07/08/2019 0910   BASOSABS 0.0 07/08/2019 0910    Lab Results  Component Value Date   HGBA1C 5.9 (H) 04/27/2020    The 10-year ASCVD risk score (Arnett DK, et al., 2019) is: 38.3%   Values used to calculate the score:     Age: 20 years     Sex: Male     Is Non-Hispanic African American: Yes     Diabetic: No     Tobacco smoker: Yes     Systolic Blood Pressure: 045 mmHg     Is BP treated: Yes     HDL Cholesterol: 43 mg/dL     Total Cholesterol: 173 mg/dL  Assessment & Plan:  1. Essential  hypertension Uncontrolled due to medication nonadherence Counseled on the risk of complications and adherence strongly encouraged Counseled on blood pressure goal of less than 130/80, low-sodium, DASH diet, medication compliance, 150 minutes of moderate intensity exercise per week. Discussed medication compliance, adverse effects. - amLODipine (NORVASC) 10 MG tablet; Take 1 tablet (10 mg total) by mouth daily. To lower blood pressure  Dispense: 90 tablet; Refill: 1 - valsartan (DIOVAN) 80 MG tablet; Take 1 tablet (80 mg total) by mouth daily.  Dispense: 90 tablet; Refill: 1 - LP+Non-HDL Cholesterol - CMP14+EGFR - CBC with Differential/Platelet  2. Smoking greater than 20 pack years Spent 3 minutes counseling on smoking cessation but he is not ready to quit at this time.  States he will like to work on it on his own - [place]; Future  3. COPD mixed type ([place]) Stable Advised  that smoking cessation will reduce progression of condition Doing well on breast tree and Proventil  4. At increased risk for cardiovascular disease Nonadherent with statin We will switch from pravastatin to atorvastatin - atorvastatin (LIPITOR) 20 MG tablet; Take 1 tablet (20 mg total) by mouth daily.  Dispense: 90 tablet; Refill: 1   Meds ordered this encounter  Medications   amLODipine (NORVASC) 10 MG tablet    Sig: Take 1 tablet (10 mg total) by mouth daily. To lower blood pressure    Dispense:  90 tablet    Refill:  1    Dose increase   valsartan (DIOVAN) 80 MG tablet    Sig: Take 1 tablet (80 mg total) by mouth daily.    Dispense:  90 tablet    Refill:  1   atorvastatin (LIPITOR) 20 MG tablet    Sig: Take 1 tablet (20 mg total) by mouth daily.    Dispense:  90 tablet    Refill:  1    Discontinue pravastatin    Follow-up: Return in about 3 months (around 09/19/2021) for Chronic medical conditions.       Charlott Rakes, MD, FAAFP. [facility] [place] [facility], [place]   06/19/2021, 9:08 AM

## 2021-06-20 LAB — CMP14+EGFR
ALT: 18 IU/L (ref 0–44)
AST: 21 IU/L (ref 0–40)
Albumin/Globulin Ratio: 2.2 (ref 1.2–2.2)
Albumin: 4.4 g/dL (ref 3.8–4.8)
Alkaline Phosphatase: 97 IU/L (ref 44–121)
BUN/Creatinine Ratio: 14 (ref 10–24)
BUN: 12 mg/dL (ref 8–27)
Bilirubin Total: 0.3 mg/dL (ref 0.0–1.2)
CO2: 25 mmol/L (ref 20–29)
Calcium: 9.2 mg/dL (ref 8.6–10.2)
Chloride: 104 mmol/L (ref 96–106)
Creatinine, Ser: 0.88 mg/dL (ref 0.76–1.27)
Globulin, Total: 2 g/dL (ref 1.5–4.5)
Glucose: 95 mg/dL (ref 70–99)
Potassium: 4 mmol/L (ref 3.5–5.2)
Sodium: 142 mmol/L (ref 134–144)
Total Protein: 6.4 g/dL (ref 6.0–8.5)
eGFR: 96 mL/min/{1.73_m2} (ref >59)

## 2021-06-20 LAB — CBC WITH DIFFERENTIAL/PLATELET
Basophils Absolute: 0 10*3/uL (ref 0.0–0.2)
Basos: 1 %
EOS (ABSOLUTE): 0.1 10*3/uL (ref 0.0–0.4)
Eos: 3 %
Hematocrit: 44.4 % (ref 37.5–51.0)
Hemoglobin: 15.1 g/dL (ref 13.0–17.7)
Immature Grans (Abs): 0 10*3/uL (ref 0.0–0.1)
Immature Granulocytes: 0 %
Lymphocytes Absolute: 1.6 10*3/uL (ref 0.7–3.1)
Lymphs: 42 %
MCH: 31.6 pg (ref 26.6–33.0)
MCHC: 34 g/dL (ref 31.5–35.7)
MCV: 93 fL (ref 79–97)
Monocytes Absolute: 0.5 10*3/uL (ref 0.1–0.9)
Monocytes: 14 %
Neutrophils Absolute: 1.5 10*3/uL (ref 1.4–7.0)
Neutrophils: 40 %
Platelets: 179 10*3/uL (ref 150–450)
RBC: 4.78 x10E6/uL (ref 4.14–5.80)
RDW: 13.2 % (ref 11.6–15.4)
WBC: 3.7 10*3/uL (ref 3.4–10.8)

## 2021-06-20 LAB — LP+NON-HDL CHOLESTEROL
Cholesterol, Total: 183 mg/dL (ref 100–199)
HDL: 50 mg/dL (ref >39)
LDL Chol Calc (NIH): 123 mg/dL — ABNORMAL HIGH (ref 0–99)
Total Non-HDL-Chol (LDL+VLDL): 133 mg/dL — ABNORMAL HIGH (ref 0–129)
Triglycerides: 50 mg/dL (ref 0–149)
VLDL Cholesterol Cal: 10 mg/dL (ref 5–40)

## 2021-06-21 ENCOUNTER — Telehealth: Payer: Self-pay

## 2021-06-21 ENCOUNTER — Other Ambulatory Visit: Payer: Self-pay

## 2021-06-21 NOTE — Telephone Encounter (Signed)
Pt states that his amlodipine makes him dizzy.

## 2021-06-21 NOTE — Telephone Encounter (Signed)
Please advise him to take half of the '10mg'$  tablet daily to see if symptoms improve.

## 2021-06-25 ENCOUNTER — Ambulatory Visit (HOSPITAL_COMMUNITY)
Admission: RE | Admit: 2021-06-25 | Discharge: 2021-06-25 | Disposition: A | Discharge status: Home / Self Care | Payer: Self-pay | Source: Ambulatory Visit | Attending: Family Medicine | Admitting: Family Medicine

## 2021-06-25 DIAGNOSIS — F17210 Nicotine dependence, cigarettes, uncomplicated: Secondary | ICD-10-CM | POA: Insufficient documentation

## 2021-06-26 NOTE — Telephone Encounter (Signed)
Called placed to patient and he states that he has already tried to take 1/2 off pill and that did not help with the dizziness.He states that he found an old prescription on Valsartan '40mg'$  that he takes daily and he states that he is having no dizziness.

## 2021-06-27 ENCOUNTER — Other Ambulatory Visit: Payer: Self-pay | Admitting: Family Medicine

## 2021-06-27 DIAGNOSIS — I2510 Atherosclerotic heart disease of native coronary artery without angina pectoris: Secondary | ICD-10-CM

## 2021-06-27 NOTE — Telephone Encounter (Signed)
He is already supposed to be on valsartan 80 mg daily and amlodipine is supposed to be the second antihypertensive.  If he has dizziness with the amlodipine 10 mg and 5 mg I will need him to continue valsartan 80 mg and he needs an office visit so I can reassess his blood pressure and substitute with another antihypertensive if indicated.

## 2021-07-02 NOTE — Telephone Encounter (Signed)
Pt called and informed to take '80mg'$  of Valsartan medication, he was also given in person appointment to check BP.

## 2021-07-03 ENCOUNTER — Telehealth: Payer: Self-pay | Admitting: *Deleted

## 2021-07-03 NOTE — Telephone Encounter (Signed)
Pt called for results, results and provider comments given. Pt stated Norvasc makes him dizzy, I see Dr. Margarita Rana has already addressed that and increased his other antihypertensive medication. Pt states he will not take Atorvastatin, reviewed diet and exercise with him.    Charlott Rakes, MD  06/20/2021  1:33 PM EDT     Labs reveal a normal liver and kidney function but cholesterol is elevated likely due to nonadherence with his statin.  Please encouraged him to be adherent with his medications and adhere to a low-cholesterol diet.  Thanks

## 2021-07-04 NOTE — Progress Notes (Signed)
Cardiology Office Note:    Date:  07/05/2021   ID:  [name], DOB 03/24/1955, MRN [id number]  PCP:  Pcp, No  Cardiologist:  None  Electrophysiologist:  None   Referring MD: Daniel Rakes, MD   Chief Complaint/Reason for Referral: CAD  History of Present Illness:    [name] is a 66 y.o. male with a history of CAD, hypertension, hyperlipidemia, COPD, asthma, and tobacco dependence (greater than 40 pack-year), here for the evaluation of coronary artery disease due to lipid rich plaque at the request of Dr. Margarita Green.  He saw Dr. Margarita Green on 06/19/2021. His blood pressure was uncontrolled due to forgetting to take his medications. He noted improvement in his COPD symptoms with Breztri and albuterol. Continued to smoke but he wished to work on quitting without pharmacotherapy.   He had a chest CT for lung cancer screening on 06/25/2021. This was notable for presence of emphysema, aortic atherosclerosis, and left anterior descending coronary artery disease. He was referred to cardiology for further evaluation.  Today, he states he feels good or bad depending on the day of the week. While walking around he denies any chest pain or pressure. Also no chest pain associated with emotional stress. 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 He will become short winded but he denies anginal symptoms.  He confirms that he continues to smoke. His shortness of breath is usually improved by using his rescue inhaler. Currently he is only using his Breztri inhaler because he is out of his albuterol. Therefore he has needed to use Breztri twice as often as prescribed. For a time after using his inhaler, he is able to walk without shortness of breath.  He had quit smoking for about 3-4 months.  He restarted smoking due to stress. Since then, he has noticed his wheezing. He has been using nicotine gum to help with quitting.  His blood pressure is elevated  in clinic today at 175/94, which he attributes to eating large portions of crab and shrimp last night.  He readily endorses medication and dietary noncompliance which is complicating his improvement.  He is concerned about his heart and when we discuss stress testing and shared decision making, he feels stress testing would be indicated to ensure the calcifications noted on CT chest do not represent obstructive CAD and are not contributors to his dyspnea on exertion.  He denies any palpitations, or peripheral edema. No lightheadedness, headaches, syncope, orthopnea, or PND.   Past Medical History:  Diagnosis Date   Asthma    COPD (chronic obstructive pulmonary disease) (HCC)    Hx of adenomatous polyp of colon 01/13/2020   Hypertension     Past Surgical History:  Procedure Laterality Date   HAND SURGERY Bilateral left 9924,2683 Rt    Current Medications: Current Meds  Medication Sig   albuterol (VENTOLIN HFA) 108 (90 Base) MCG/ACT inhaler Inhale 2 puffs into the lungs every 6 (six) hours as needed for wheezing or shortness of breath.   Budeson-Glycopyrrol-Formoterol (BREZTRI AEROSPHERE) 160-9-4.8 MCG/ACT AERO Inhale 2 puffs into the lungs 2 (two) times daily.   ipratropium-albuterol (DUONEB) 0.5-2.5 (3) MG/3ML SOLN TAKE 3 MLS BY NEBULIZATION EVERY 6 (SIX) HOURS AS NEEDED (SEVERE WHEEZING).   ivabradine (CORLANOR) 5 MG TABS tablet Take 3 tablets (15 mg total) by mouth once for 1 dose. PLEASE TAKE IVABRADINE '15mg'$  TWO HOURS PRIOR TO CTA SCAN   [DISCONTINUED] valsartan (DIOVAN) 80 MG tablet Take 1  tablet (80 mg total) by mouth daily.     Allergies:   Codeine and Penicillins   Social History   Tobacco Use   Smoking status: Some Days    Packs/day: 1.00    Years: 45.00    Total pack years: 45.00    Types: Cigarettes   Smokeless tobacco: Never  Vaping Use   Vaping Use: Never used  Substance Use Topics   Alcohol use: Yes    Alcohol/week: 1.0 standard drink of alcohol    Types: 1  Standard drinks or equivalent per week    Comment: "every once in awhile"-3 times a month per pt   Drug use: Yes    Frequency: 1.0 times per week    Types: Marijuana    Comment: occasionally marijuana-3 times a month per pt     Family History: The patient's family history includes Cancer in his maternal grandmother; Colon cancer in his cousin; Hypertension in his maternal grandfather and paternal uncle. There is no history of Colon polyps, Esophageal cancer, Stomach cancer, or Rectal cancer.  ROS:   Please see the history of present illness.    (+) Shortness of breath All other systems reviewed and are negative.  EKGs/Labs/Other Studies Reviewed:    The following studies were reviewed today:  CT Chest  06/25/2021:   IMPRESSION: 1. Lung-RADS 2S, benign appearance or behavior. Continue annual screening with low-dose chest CT without contrast in 12 months. 2. The "S" modifier above refers to potentially clinically significant non lung cancer related findings. Specifically, there is aortic atherosclerosis, in addition to left anterior descending coronary artery disease. Please note that although the presence of coronary artery calcium documents the presence of coronary artery disease, the severity of this disease and any potential stenosis cannot be assessed on this non-gated CT examination. Assessment for potential risk factor modification, dietary therapy or pharmacologic therapy may be warranted, if clinically indicated. 3. Mild diffuse bronchial wall thickening with mild centrilobular and paraseptal emphysema; imaging findings suggestive of underlying COPD.   Aortic Atherosclerosis (ICD10-I70.0) and Emphysema (ICD10-J43.9).   EKG:   EKG is personally reviewed. 07/05/2021: Sinus rhythm. Rate 80 bpm.  Imaging studies that I have independently reviewed today: CT chest as above. CAC  Recent Labs: 06/19/2021: ALT 18; BUN 12; Creatinine, Ser 0.88; Hemoglobin 15.1; Platelets 179;  Potassium 4.0; Sodium 142   Recent Lipid Panel    Component Value Date/Time   CHOL 183 06/19/2021 0917   TRIG 50 06/19/2021 0917   HDL 50 06/19/2021 0917   CHOLHDL 4.0 04/27/2020 1659   CHOLHDL 4.8 07/15/2013 1152   VLDL 19 07/15/2013 1152   LDLCALC 123 (H) 06/19/2021 0917    Physical Exam:    VS:  BP (!) 175/94   Pulse 80   Ht '5\' 9"'$  (1.753 m)   Wt 228 lb 6.4 oz (103.6 kg)   SpO2 95%   BMI 33.73 kg/m     Wt Readings from Last 5 Encounters:  07/05/21 228 lb 6.4 oz (103.6 kg)  06/19/21 223 lb 12.8 oz (101.5 kg)  01/12/21 240 lb (108.9 kg)  12/04/20 233 lb 12.8 oz (106.1 kg)  10/16/20 234 lb 6.4 oz (106.3 kg)    Constitutional: No acute distress Eyes: sclera non-icteric, normal conjunctiva and lids ENMT: normal dentition, moist mucous membranes Cardiovascular: regular rhythm, normal rate, no murmur. S1 and S2 normal. No jugular venous distention.  Respiratory: Bilateral wheezing GI : normal bowel sounds, soft and nontender. No distention.   MSK: extremities warm,  well perfused. No edema.  NEURO: grossly nonfocal exam, moves all extremities. PSYCH: alert and oriented x 3, normal mood and affect.   ASSESSMENT:    1. Dyspnea on exertion   2. Precordial pain   3. Essential hypertension   4. COPD mixed type ([place])   5. Medication management   6. Coronary artery calcification    PLAN:    Dyspnea on exertion CAC Precordial pain - Plan: EKG 12-Lead, CT CORONARY MORPH W/CTA COR W/SCORE W/CA W/CM &/OR WO/CM -his symptoms seem most consistent with pulmonary etiology. Recommend pulmonary appt for PFTs and review of his therapy. - shared decision making, patient would like to pursue ischemic testing in setting of coronary artery calcifications and DOE. Will perform CCTA since lexiscan is contraindicated with active wheezing. I am concerned an ETT would be nondiagnostic due to submaximal exercise despite normal resting ECG. Could consider ETT if patient does not want ot proceed  with CCTA.  - ivabradine x 1 dose prior to CT.   Essential hypertension - Plan: EKG 12-Lead, valsartan (DIOVAN) 160 MG tablet - increase dose of valsartan to 160 mg daily. Stressed importance of med and diet compliance.   COPD mixed type ([place]) - recommend pulmonary consult to evaluate this further, likely needs to be followed and meds titrated for COPD with active wheezing.   Medication management - as above.  Total time of encounter: 40 minutes total time of encounter, including 25 minutes spent in face-to-face patient care on the date of this encounter. This time includes coordination of care and counseling regarding above mentioned problem list. Remainder of non-face-to-face time involved reviewing chart documents/testing relevant to the patient encounter and documentation in the medical record. I have independently reviewed documentation from referring provider.   Cherlynn Kaiser, MD, [place]   Shared Decision Making/Informed Consent:       Medication Adjustments/Labs and Tests Ordered: Current medicines are reviewed at length with the patient today.  Concerns regarding medicines are outlined above.   Orders Placed This Encounter  Procedures   CT CORONARY MORPH W/CTA COR W/SCORE W/CA W/CM &/OR WO/CM   EKG 12-Lead   Meds ordered this encounter  Medications   valsartan (DIOVAN) 160 MG tablet    Sig: Take 1 tablet (160 mg total) by mouth daily.    Dispense:  90 tablet    Refill:  3   ivabradine (CORLANOR) 5 MG TABS tablet    Sig: Take 3 tablets (15 mg total) by mouth once for 1 dose. PLEASE TAKE IVABRADINE '15mg'$  TWO HOURS PRIOR TO CTA SCAN    Dispense:  3 tablet    Refill:  0   Patient Instructions  Medication Instructions:  PLEASE INCREASE VALSARTAN TO '160mg'$  ONCE DAILY   PLEASE TAKE IVABRADINE '15mg'$  TWO HOURS PRIOR TO CCTA SCAN   *If you need a refill on your cardiac medications before your next appointment, please call your  pharmacy*  Testing/Procedures: Your physician has requested that you have cardiac CT. Cardiac computed tomography (CT) is a painless test that uses an x-ray machine to take clear, detailed pictures of your heart. For further information please visit [email or web address]. Please follow instruction sheet as given.  Follow-Up: At [facility], you and your health needs are our priority.  As part of our continuing mission to provide you with exceptional heart care, we have created designated Provider Care Teams.  These Care Teams include your primary Cardiologist (physician) and Advanced Practice Providers (APPs -  Physician Assistants  and Nurse Practitioners) who all work together to provide you with the care you need, when you need it.  We recommend signing up for the patient portal called "MyChart".  Sign up information is provided on this After Visit Summary.  MyChart is used to connect with patients for Virtual Visits (Telemedicine).  Patients are able to view lab/test results, encounter notes, upcoming appointments, etc.  Non-urgent messages can be sent to your provider as well.   To learn more about what you can do with MyChart, go to [email or web address].    Your next appointment:   4-6 week(s)  The format for your next appointment:   In Person  Provider: ANY APP    Other Instructions   Your cardiac CT will be scheduled at one of the below locations:   [facility] [address] [place], [place] [address] [phone]  If scheduled at [facility], please arrive at the [facility] and Children's Entrance (Entrance C2) of [facility] 30 minutes prior to test start time. You can use the FREE valet parking offered at entrance C (encouraged to control the heart rate for the test)  Proceed to the [facility] Radiology Department (first floor) to check-in and test prep.  All radiology patients and guests should use entrance C2 at [facility],  accessed from [facility], even though the hospital's physical address listed is [address].     Please follow these instructions carefully (unless otherwise directed):  Hold all erectile dysfunction medications at least 3 days (72 hrs) prior to test.  On the Night Before the Test: Be sure to Drink plenty of water. Do not consume any caffeinated/decaffeinated beverages or chocolate 12 hours prior to your test. Do not take any antihistamines 12 hours prior to your test.  On the Day of the Test: Drink plenty of water until 1 hour prior to the test. Do not eat any food 4 hours prior to the test. You may take your regular medications prior to the test.  TAKE IVABRADINE '15mg'$  TWO HOURS PRIOR TO CTA SCAN   After the Test: Drink plenty of water. After receiving IV contrast, you may experience a mild flushed feeling. This is normal. On occasion, you may experience a mild rash up to 24 hours after the test. This is not dangerous. If this occurs, you can take Benadryl 25 mg and increase your fluid intake. If you experience trouble breathing, this can be serious. If it is severe call 911 IMMEDIATELY. If it is mild, please call our office. If you take any of these medications: Glipizide/Metformin, Avandament, Glucavance, please do not take 48 hours after completing test unless otherwise instructed.  We will call to schedule your test 2-4 weeks out understanding that some insurance companies will need an authorization prior to the service being performed.   For non-scheduling related questions, please contact the cardiac imaging nurse navigator should you have any questions/concerns: Marchia Bond, Cardiac Imaging Nurse Navigator Gordy Clement, Cardiac Imaging Nurse Navigator [place] [facility] Direct Office Dial: [phone]   For scheduling needs, including cancellations and rescheduling, please call [place], [phone].            I,Mathew Stumpf,acting as a Education administrator for Elouise Munroe, MD.,have documented all relevant documentation on the behalf of Elouise Munroe, MD,as directed by  Elouise Munroe, MD while in the presence of Elouise Munroe, MD.  I, Elouise Munroe, MD, have reviewed all documentation  for this visit. The documentation on 07/05/21 for the exam, diagnosis, procedures, and orders are all accurate and complete.

## 2021-07-05 ENCOUNTER — Other Ambulatory Visit: Payer: Self-pay

## 2021-07-05 ENCOUNTER — Encounter: Payer: Self-pay | Admitting: Internal Medicine

## 2021-07-05 ENCOUNTER — Ambulatory Visit (INDEPENDENT_AMBULATORY_CARE_PROVIDER_SITE_OTHER): Payer: Self-pay | Admitting: Internal Medicine

## 2021-07-05 VITALS — BP 175/94 | HR 80 | Ht 69.0 in | Wt 228.4 lb

## 2021-07-05 DIAGNOSIS — Z79899 Other long term (current) drug therapy: Secondary | ICD-10-CM

## 2021-07-05 DIAGNOSIS — I2584 Coronary atherosclerosis due to calcified coronary lesion: Secondary | ICD-10-CM

## 2021-07-05 DIAGNOSIS — J449 Chronic obstructive pulmonary disease, unspecified: Secondary | ICD-10-CM

## 2021-07-05 DIAGNOSIS — R0609 Other forms of dyspnea: Secondary | ICD-10-CM

## 2021-07-05 DIAGNOSIS — R072 Precordial pain: Secondary | ICD-10-CM

## 2021-07-05 DIAGNOSIS — I10 Essential (primary) hypertension: Secondary | ICD-10-CM

## 2021-07-05 DIAGNOSIS — I2510 Atherosclerotic heart disease of native coronary artery without angina pectoris: Secondary | ICD-10-CM

## 2021-07-05 MED ORDER — VALSARTAN 160 MG PO TABS
160.0000 mg | ORAL_TABLET | Freq: Every day | ORAL | 3 refills | Status: DC
  Filled 2021-07-05: qty 90, 90d supply, fill #0
  Filled 2022-05-13: qty 90, 90d supply, fill #1

## 2021-07-05 MED ORDER — IVABRADINE HCL 5 MG PO TABS
15.0000 mg | ORAL_TABLET | Freq: Once | ORAL | 0 refills | Status: AC
  Filled 2021-07-05: qty 3, 1d supply, fill #0

## 2021-07-05 NOTE — Patient Instructions (Signed)
Medication Instructions:  PLEASE INCREASE VALSARTAN TO '160mg'$  ONCE DAILY   PLEASE TAKE IVABRADINE '15mg'$  TWO HOURS PRIOR TO CCTA SCAN   *If you need a refill on your cardiac medications before your next appointment, please call your pharmacy*  Testing/Procedures: Your physician has requested that you have cardiac CT. Cardiac computed tomography (CT) is a painless test that uses an x-ray machine to take clear, detailed pictures of your heart. For further information please visit [email or web address]. Please follow instruction sheet as given.  Follow-Up: At [facility], you and your health needs are our priority.  As part of our continuing mission to provide you with exceptional heart care, we have created designated Provider Care Teams.  These Care Teams include your primary Cardiologist (physician) and Advanced Practice Providers (APPs -  Physician Assistants and Nurse Practitioners) who all work together to provide you with the care you need, when you need it.  We recommend signing up for the patient portal called "MyChart".  Sign up information is provided on this After Visit Summary.  MyChart is used to connect with patients for Virtual Visits (Telemedicine).  Patients are able to view lab/test results, encounter notes, upcoming appointments, etc.  Non-urgent messages can be sent to your provider as well.   To learn more about what you can do with MyChart, go to [email or web address].    Your next appointment:   4-6 week(s)  The format for your next appointment:   In Person  Provider: ANY APP    Other Instructions   Your cardiac CT will be scheduled at one of the below locations:   [facility] [address] [place], [place] [address] [phone]  If scheduled at [facility], please arrive at the [facility] and Children's Entrance (Entrance C2) of [facility] 30 minutes prior to test start time. You can use the FREE valet parking offered at entrance  C (encouraged to control the heart rate for the test)  Proceed to the [facility] Radiology Department (first floor) to check-in and test prep.  All radiology patients and guests should use entrance C2 at [facility], accessed from [facility], even though the hospital's physical address listed is [address].     Please follow these instructions carefully (unless otherwise directed):  Hold all erectile dysfunction medications at least 3 days (72 hrs) prior to test.  On the Night Before the Test: Be sure to Drink plenty of water. Do not consume any caffeinated/decaffeinated beverages or chocolate 12 hours prior to your test. Do not take any antihistamines 12 hours prior to your test.  On the Day of the Test: Drink plenty of water until 1 hour prior to the test. Do not eat any food 4 hours prior to the test. You may take your regular medications prior to the test.  TAKE IVABRADINE '15mg'$  TWO HOURS PRIOR TO CTA SCAN   After the Test: Drink plenty of water. After receiving IV contrast, you may experience a mild flushed feeling. This is normal. On occasion, you may experience a mild rash up to 24 hours after the test. This is not dangerous. If this occurs, you can take Benadryl 25 mg and increase your fluid intake. If you experience trouble breathing, this can be serious. If it is severe call 911 IMMEDIATELY. If it is mild, please call our office. If you take any of these medications: Glipizide/Metformin, Avandament, Glucavance, please do not take 48 hours after completing test unless otherwise instructed.  We will call to schedule your test 2-4 weeks out understanding that some insurance companies will need an authorization prior to the service being performed.   For non-scheduling related questions, please contact the cardiac imaging nurse navigator should you have any questions/concerns: Marchia Bond, Cardiac Imaging Nurse Navigator Gordy Clement, Cardiac  Imaging Nurse Navigator [place] [facility] Direct Office Dial: [phone]   For scheduling needs, including cancellations and rescheduling, please call [place], [phone].

## 2021-07-06 ENCOUNTER — Other Ambulatory Visit: Payer: Self-pay

## 2021-07-12 ENCOUNTER — Telehealth: Payer: Self-pay | Admitting: Licensed Clinical Social Worker

## 2021-07-12 NOTE — Telephone Encounter (Signed)
LCSW called pt this morning at [phone]. Introduced self, role, reason for call. He is in the car with his wife and gives permission for me to speak with them both. They are on the way to [place], I briefly explained why I wanted to screen him for assistance programs. Pt agreeable to me calling him back again on Wednesday 7/5 when he has returned for full assessment.   Westley Hummer, MSW, [place]  [phone]- work cell phone (preferred) [phone]- desk phone

## 2021-07-19 ENCOUNTER — Encounter: Payer: Self-pay | Admitting: Licensed Clinical Social Worker

## 2021-07-19 ENCOUNTER — Telehealth: Payer: Self-pay | Admitting: Licensed Clinical Social Worker

## 2021-07-19 NOTE — Progress Notes (Signed)
Heart and Vascular Care Navigation  07/19/2021  [name] 14-Sep-1955 [id number]  Reason for Referral:  Patient is participating in a Managed Medicaid Plan: No, self pay only Uninsured Engaged with patient by telephone for initial visit for Heart and Vascular Care Coordination.                                                                                                   Assessment:   LCSW was able to reach pt to f/u again at [phone]. Confirmed home address, PCP and emergency contacts. Pt lives with mother, has significant other [name] (stays between two houses). No insurance, relies on SSI only (just started receiving). He has SNAP benefits, no issues with turn off notices or anything concerning, just general financial mindfulness due to limited income. No issues with medications/gets them through [facility] at low cost and states this is not a problem. He is interested in [place] and [employer], I will start them and send to him to complete and return or turn in. I also encouraged pt in October to speak with [facility] counselors about Medicare enrollment, and have assistance w/ signing up for appropriate benefits. Pt appreciative of all information, will reach out to this writer if any additional questions or concerns arise.                                      HRT/VAS Care Coordination     Patients Home Cardiology Office [place] Team Social Worker   Social Worker Name: Valeda Malm, [place] [phone]   Living arrangements for the past 2 months Single Family Home   Lives with: Significant Other; Parents   Patient Current Insurance Coverage Self-Pay   Patient Has Concern With Paying Medical Bills Yes   Patient Concerns With Medical Bills no coverage, ongoing medical work up   Medical Bill Referrals: CAFA/OC   Does Patient Have Prescription Coverage? No   Home Assistive Devices/Equipment None   DME [place].        Social History:                                                                             SDOH Screenings   Alcohol Screen: Not on file  Depression (PHQ2-9): Low Risk  (06/19/2021)   Depression (PHQ2-9)    PHQ-2 Score: 0  Financial Resource Strain: Medium Risk (07/19/2021)   Overall Financial Resource Strain (CARDIA)    Difficulty of Paying Living Expenses: Somewhat hard  Food Insecurity: No Food Insecurity (07/19/2021)   Hunger Vital Sign    Worried About Running Out of Food in the Last Year: Never true    Ran Out of  Food in the Last Year: Never true  Housing: Low Risk  (07/19/2021)   Housing    Last Housing Risk Score: 0  Physical Activity: Not on file  Social Connections: Not on file  Stress: Not on file  Tobacco Use: High Risk (07/19/2021)   Patient History    Smoking Tobacco Use: Some Days    Smokeless Tobacco Use: Never    Passive Exposure: Not on file  Transportation Needs: No Transportation Needs (07/19/2021)   PRAPARE - Transportation    Lack of Transportation (Medical): No    Lack of Transportation (Non-Medical): No    SDOH Interventions: Financial Resources:  Financial Strain Interventions: Other (Comment) (CAFA/OC) Financial Counseling for [employer] Program  Food Insecurity:  Food Insecurity Interventions: Other (Comment) (recieves SNAP)  Housing Insecurity:  Housing Interventions: Intervention Not Indicated  Transportation:   Transportation Interventions: Intervention Not Indicated     Other Care Navigation Interventions:     Provided Pharmacy assistance resources  Pt denies any current concerns    Follow-up plan:   LCSW has mailed the following: my card, CAFA, [employer], [employer] information and enrollment information for [employer]. LCSW will f/u to ensure these have been received and pt was encouraged to reach out to me with any additional questions/concerns that may arise. I remain available as needed.

## 2021-07-23 ENCOUNTER — Other Ambulatory Visit: Payer: Self-pay

## 2021-07-23 ENCOUNTER — Ambulatory Visit: Payer: Self-pay | Attending: Family Medicine | Admitting: Family Medicine

## 2021-07-23 ENCOUNTER — Encounter: Payer: Self-pay | Admitting: Family Medicine

## 2021-07-23 VITALS — BP 166/84 | HR 85 | Temp 97.9°F | Ht 69.0 in | Wt 231.0 lb

## 2021-07-23 DIAGNOSIS — J449 Chronic obstructive pulmonary disease, unspecified: Secondary | ICD-10-CM

## 2021-07-23 DIAGNOSIS — Z9189 Other specified personal risk factors, not elsewhere classified: Secondary | ICD-10-CM

## 2021-07-23 DIAGNOSIS — I10 Essential (primary) hypertension: Secondary | ICD-10-CM

## 2021-07-23 MED ORDER — ALBUTEROL SULFATE HFA 108 (90 BASE) MCG/ACT IN AERS
2.0000 | INHALATION_SPRAY | Freq: Four times a day (QID) | RESPIRATORY_TRACT | 2 refills | Status: DC | PRN
  Filled 2021-07-23: qty 18, 25d supply, fill #0

## 2021-07-23 MED ORDER — ATORVASTATIN CALCIUM 20 MG PO TABS
20.0000 mg | ORAL_TABLET | Freq: Every day | ORAL | 1 refills | Status: DC
  Filled 2021-07-23: qty 90, 90d supply, fill #0

## 2021-07-23 NOTE — Progress Notes (Signed)
Subjective:  Patient ID: [name], male    DOB: May 01, 1955  Age: 66 y.o. MRN: [id number]  CC: Hypertension   HPI [name] is a 66 y.o. year old male with a history of hypertension, hyperlipidemia, COPD, tobacco dependence (greater than 40-pack-year) here for follow-up visit.  Interval History: He presents today for blood pressure follow-up.  Seen by cardiology 3 weeks ago ago and valsartan dose was increased to 160 mg. 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  He has remained on 80 mg of valsartan instead.  He forgot his Albuterol MDI while visiting [place] but has been doing his Breztri instead.  He does experience some intermittent wheezing but no cough or dyspnea.  He continues to smoke about 2 cigarettes/day and is using nicotine gums with the hopes of quitting. I had referred him for PFTs in 11/2020 which he never underwent. Past Medical History:  Diagnosis Date   Asthma    COPD (chronic obstructive pulmonary disease) ([place])    Hx of adenomatous polyp of colon 01/13/2020   Hypertension     Past Surgical History:  Procedure Laterality Date   HAND SURGERY Bilateral left [id number] Rt    Family History  Problem Relation Age of Onset   Hypertension Paternal Uncle    Cancer Maternal Grandmother    Hypertension Maternal Grandfather    Colon cancer Cousin    Colon polyps Neg Hx    Esophageal cancer Neg Hx    Stomach cancer Neg Hx    Rectal cancer Neg Hx     Social History   Socioeconomic History   Marital status: Significant Other    Spouse name: Not on file   Number of children: Not on file   Years of education: Not on file   Highest education level: Not on file  Occupational History   Not on file  Tobacco Use   Smoking status: Some Days    Packs/day: 1.00    Years: 45.00    Total pack years:  45.00    Types: Cigarettes   Smokeless tobacco: Never  Vaping Use   Vaping Use: Never used  Substance and Sexual Activity   Alcohol use: Yes    Alcohol/week: 1.0 standard drink of alcohol    Types: 1 Standard drinks or equivalent per week    Comment: "every once in awhile"-3 times a month per pt   Drug use: Yes    Frequency: 1.0 times per week    Types: Marijuana    Comment: occasionally marijuana-3 times a month per pt   Sexual activity: Not on file  Other Topics Concern   Not on file  Social History Narrative   Not on file   Social Determinants of Health   Financial Resource Strain: Medium Risk (07/19/2021)   Overall Financial Resource Strain (CARDIA)    Difficulty of Paying Living Expenses: Somewhat hard  Food Insecurity: No Food Insecurity (07/19/2021)   Hunger Vital Sign    Worried About Running Out of Food in the Last Year: Never true    Ran Out of Food in the Last Year: Never true  Transportation Needs: No Transportation Needs (07/19/2021)   PRAPARE - Hydrologist (Medical): No    Lack of Transportation (Non-Medical): No  Physical Activity: Not on file  Stress: Not on file  Social Connections: Not on file    Allergies  Allergen Reactions   Codeine Itching   Penicillins Itching and Other (See Comments)    Has patient had a PCN reaction causing immediate rash, facial/tongue/throat swelling, SOB or lightheadedness with hypotension: No Has patient had a PCN reaction causing severe rash involving mucus membranes or skin necrosis: No Has patient had a PCN reaction that required hospitalization No Has patient had a PCN reaction occurring within the last 10 years: No If all of the above answers are "NO", then may proceed with Cephalosporin use.    Outpatient Medications Prior to Visit  Medication Sig Dispense Refill   Budeson-Glycopyrrol-Formoterol (BREZTRI AEROSPHERE) 160-9-4.8 MCG/ACT AERO Inhale 2 puffs into the lungs 2 (two) times daily.  10.7 g 6   ipratropium-albuterol (DUONEB) 0.5-2.5 (3) MG/3ML SOLN TAKE 3 MLS BY NEBULIZATION EVERY 6 (SIX) HOURS AS NEEDED (SEVERE WHEEZING). 360 mL 3   valsartan (DIOVAN) 160 MG tablet Take 1 tablet (160 mg total) by mouth daily. 90 tablet 3   albuterol (VENTOLIN HFA) 108 (90 Base) MCG/ACT inhaler Inhale 2 puffs into the lungs every 6 (six) hours as needed for wheezing or shortness of breath. 18 g 2   No facility-administered medications prior to visit.     ROS Review of Systems  Constitutional:  Negative for activity change and appetite change.  HENT:  Negative for sinus pressure and sore throat.   Respiratory:  Positive for wheezing. Negative for chest tightness and shortness of breath.   Cardiovascular:  Negative for chest pain and palpitations.  Gastrointestinal:  Negative for abdominal distention, abdominal pain and constipation.  Genitourinary: Negative.   Musculoskeletal: Negative.   Psychiatric/Behavioral:  Negative for behavioral problems and dysphoric mood.     Objective:  BP (!) 166/84   Pulse 85   Temp 97.9 F (36.6 C) (Oral)   Ht '5\' 9"'$  (1.753 m)   Wt 231 lb (104.8 kg)   SpO2 97%   BMI 34.11 kg/m      07/23/2021   10:49 AM 07/05/2021    9:00 AM 06/19/2021    8:43 AM  BP/Weight  Systolic BP 093 267 124  Diastolic BP 84 94 83  Wt. (Lbs) 231 228.4 223.8  BMI 34.11 kg/m2 33.73 kg/m2 33.05 kg/m2      Physical Exam Constitutional:      Appearance: He is well-developed.  Cardiovascular:     Rate and Rhythm: Normal rate.     Heart sounds: Normal heart sounds. No murmur heard. Pulmonary:     Effort: Pulmonary effort is normal.     Breath sounds: Normal breath sounds. No wheezing or rales.  Chest:     Chest wall: No tenderness.  Abdominal:     General: Bowel sounds are normal. There is no distension.     Palpations: Abdomen is soft. There is no mass.     Tenderness: There is no abdominal tenderness.  Musculoskeletal:        General: Normal range of motion.      Right lower leg: No edema.     Left lower leg: No edema.  Neurological:     Mental Status: He is alert and oriented to person, place, and time.  Psychiatric:        Mood and Affect: Mood normal.        Latest Ref Rng & Units 06/19/2021    9:17 AM 04/27/2020    4:59 PM 07/08/2019    9:10 AM  CMP  Glucose 70 - 99 mg/dL 95  145  94   BUN 8 - 27 mg/dL '12  6  8   '$ Creatinine 0.76 - 1.27 mg/dL 0.88  0.87  0.94   Sodium 134 - 144 mmol/L 142  141  140   Potassium 3.5 - 5.2 mmol/L 4.0  3.8  4.1   Chloride 96 - 106 mmol/L 104  105  101   CO2 20 - 29 mmol/L '25  23  28   '$ Calcium 8.6 - 10.2 mg/dL 9.2  8.5  9.1   Total Protein 6.0 - 8.5 g/dL 6.4  6.1  6.5   Total Bilirubin 0.0 - 1.2 mg/dL 0.3  0.7  0.5   Alkaline Phos 44 - 121 IU/L 97  100  106   AST 0 - 40 IU/L '21  24  16   '$ ALT 0 - 44 IU/L '18  20  13     '$ Lipid Panel     Component Value Date/Time   CHOL 183 06/19/2021 0917   TRIG 50 06/19/2021 0917   HDL 50 06/19/2021 0917   CHOLHDL 4.0 04/27/2020 1659   CHOLHDL 4.8 07/15/2013 1152   VLDL 19 07/15/2013 1152   LDLCALC 123 (H) 06/19/2021 0917    CBC    Component Value Date/Time   WBC 3.7 06/19/2021 0917   WBC 5.8 06/11/2017 1630   RBC 4.78 06/19/2021 0917   RBC 4.70 06/11/2017 1630   HGB 15.1 06/19/2021 0917   HCT 44.4 06/19/2021 0917   PLT 179 06/19/2021 0917   MCV 93 06/19/2021 0917   MCH 31.6 06/19/2021 0917   MCH 30.9 06/11/2017 1630   MCHC 34.0 06/19/2021 0917   MCHC 33.2 06/11/2017 1630   RDW 13.2 06/19/2021 0917   LYMPHSABS 1.6 06/19/2021 0917   MONOABS 1.1 (H) 06/11/2017 1630   EOSABS 0.1 06/19/2021 0917   BASOSABS 0.0 06/19/2021 0917    Lab Results  Component Value Date   HGBA1C 5.9 (H) 04/27/2020    The 10-year ASCVD risk score (Arnett DK, et al., 2019) is: 37.4%   Values used to calculate the score:     Age: 8 years     Sex: Male     Is Non-Hispanic African American: Yes     Diabetic: No     Tobacco smoker: Yes     Systolic Blood Pressure: 170  mmHg     Is BP treated: Yes     HDL Cholesterol: 50 mg/dL     Total Cholesterol: 183 mg/dL  Assessment & Plan:  1. Essential hypertension Uncontrolled due to medication nonadherence Advised he can start taking his new dose of 180 mg of valsartan on a day when he is home so he can monitor for dizziness. Advised to also obtain blood pressure monitor to check his blood pressure Counseled on blood pressure goal of less than 130/80, low-sodium, DASH diet, medication compliance, 150 minutes of moderate intensity exercise per week. Discussed medication compliance, adverse effects.  2. COPD mixed type [facility]) Physical exam is normal but his history reveals suboptimal control He has been out of his albuterol MDI which I have sent to his pharmacy Previously referred for P FTs in the fall of last year; I will refer him again Advised that smoking cessation will be beneficial - Pulmonary function test; Future - albuterol (VENTOLIN HFA) 108 (90 Base) MCG/ACT inhaler; Inhale 2 puffs into the lungs every 6 (six) hours as needed for wheezing or shortness of breath.  Dispense: 18 g; Refill: 2  3. At increased risk for cardiovascular disease Prescribed atorvastatin but this does not appear on his med list-appears to have been discontinued by the CMA at the cardiology visit I have represcribed it. - atorvastatin (LIPITOR) 20 MG tablet; Take 1 tablet (20 mg total) by mouth daily.  Dispense: 90 tablet; Refill: 1    Meds ordered this encounter  Medications   albuterol (VENTOLIN HFA) 108 (90 Base) MCG/ACT inhaler    Sig: Inhale 2 puffs into the lungs every 6 (six) hours as needed for wheezing or shortness of breath.    Dispense:  18 g    Refill:  2   atorvastatin (LIPITOR) 20 MG tablet    Sig: Take 1 tablet (20 mg total) by mouth daily.    Dispense:  90 tablet    Refill:  1    Follow-up: Return in about 3 months (around 10/23/2021) for Chronic medical conditions.       Charlott Rakes, MD,  FAAFP. [facility] [place], [place]   07/23/2021, 11:32 AM

## 2021-07-23 NOTE — Progress Notes (Signed)
Needs refill on inhaler due to mis placing the original one.

## 2021-07-23 NOTE — Patient Instructions (Signed)
Managing Your Hypertension Hypertension, also called high blood pressure, is when the force of the blood pressing against the walls of the arteries is too strong. Arteries are blood vessels that carry blood from your heart throughout your body. Hypertension forces the heart to work harder to pump blood and may cause the arteries to become narrow or stiff. Understanding blood pressure readings A blood pressure reading includes a higher number over a lower number: The first, or top, number is called the systolic pressure. It is a measure of the pressure in your arteries as your heart beats. The second, or bottom number, is called the diastolic pressure. It is a measure of the pressure in your arteries as the heart relaxes. For most people, a normal blood pressure is below 120/80. Your personal target blood pressure may vary depending on your medical conditions, your age, and other factors. Blood pressure is classified into four stages. Based on your blood pressure reading, your health care provider may use the following stages to determine what type of treatment you need, if any. Systolic pressure and diastolic pressure are measured in a unit called millimeters of mercury (mmHg). Normal Systolic pressure: below 120. Diastolic pressure: below 80. Elevated Systolic pressure: 120-129. Diastolic pressure: below 80. Hypertension stage 1 Systolic pressure: 130-139. Diastolic pressure: 80-89. Hypertension stage 2 Systolic pressure: 140 or above. Diastolic pressure: 90 or above. How can this condition affect me? Managing your hypertension is very important. Over time, hypertension can damage the arteries and decrease blood flow to parts of the body, including the brain, heart, and kidneys. Having untreated or uncontrolled hypertension can lead to: A heart attack. A stroke. A weakened blood vessel (aneurysm). Heart failure. Kidney damage. Eye damage. Memory and concentration problems. Vascular  dementia. What actions can I take to manage this condition? Hypertension can be managed by making lifestyle changes and possibly by taking medicines. Your health care provider will help you make a plan to bring your blood pressure within a normal range. You may be referred for counseling on a healthy diet and physical activity. Nutrition  Eat a diet that is high in fiber and potassium, and low in salt (sodium), added sugar, and fat. An example eating plan is called the DASH diet. DASH stands for Dietary Approaches to Stop Hypertension. To eat this way: Eat plenty of fresh fruits and vegetables. Try to fill one-half of your plate at each meal with fruits and vegetables. Eat whole grains, such as whole-wheat pasta, brown rice, or whole-grain bread. Fill about one-fourth of your plate with whole grains. Eat low-fat dairy products. Avoid fatty cuts of meat, processed or cured meats, and poultry with skin. Fill about one-fourth of your plate with lean proteins such as fish, chicken without skin, beans, eggs, and tofu. Avoid pre-made and processed foods. These tend to be higher in sodium, added sugar, and fat. Reduce your daily sodium intake. Many people with hypertension should eat less than 1,500 mg of sodium a day. Lifestyle  Work with your health care provider to maintain a healthy body weight or to lose weight. Ask what an ideal weight is for you. Get at least 30 minutes of exercise that causes your heart to beat faster (aerobic exercise) most days of the week. Activities may include walking, swimming, or biking. Include exercise to strengthen your muscles (resistance exercise), such as weight lifting, as part of your weekly exercise routine. Try to do these types of exercises for 30 minutes at least 3 days a week. Do   not use any products that contain nicotine or tobacco. These products include cigarettes, chewing tobacco, and vaping devices, such as e-cigarettes. If you need help quitting, ask your  health care provider. Control any long-term (chronic) conditions you have, such as high cholesterol or diabetes. Identify your sources of stress and find ways to manage stress. This may include meditation, deep breathing, or making time for fun activities. Alcohol use Do not drink alcohol if: Your health care provider tells you not to drink. You are pregnant, may be pregnant, or are planning to become pregnant. If you drink alcohol: Limit how much you have to: 0-1 drink a day for women. 0-2 drinks a day for men. Know how much alcohol is in your drink. In the [place] one drink equals one 12 oz bottle of beer (355 mL), one 5 oz glass of wine (148 mL), or one 1 oz glass of hard liquor (44 mL). Medicines Your health care provider may prescribe medicine if lifestyle changes are not enough to get your blood pressure under control and if: Your systolic blood pressure is 130 or higher. Your diastolic blood pressure is 80 or higher. Take medicines only as told by your health care provider. Follow the directions carefully. Blood pressure medicines must be taken as told by your health care provider. The medicine does not work as well when you skip doses. Skipping doses also puts you at risk for problems. Monitoring Before you monitor your blood pressure: Do not smoke, drink caffeinated beverages, or exercise within 30 minutes before taking a measurement. Use the bathroom and empty your bladder (urinate). Sit quietly for at least 5 minutes before taking measurements. Monitor your blood pressure at home as told by your health care provider. To do this: Sit with your back straight and supported. Place your feet flat on the floor. Do not cross your legs. Support your arm on a flat surface, such as a table. Make sure your upper arm is at heart level. Each time you measure, take two or three readings one minute apart and record the results. You may also need to have your blood pressure checked regularly by  your health care provider. General information Talk with your health care provider about your diet, exercise habits, and other lifestyle factors that may be contributing to hypertension. Review all the medicines you take with your health care provider because there may be side effects or interactions. Keep all follow-up visits. Your health care provider can help you create and adjust your plan for managing your high blood pressure. Where to find more information National Heart, Lung, and Blood Institute: www.nhlbi.nih.gov American Heart Association: www.heart.org Contact a health care provider if: You think you are having a reaction to medicines you have taken. You have repeated (recurrent) headaches. You feel dizzy. You have swelling in your ankles. You have trouble with your vision. Get help right away if: You develop a severe headache or confusion. You have unusual weakness or numbness, or you feel faint. You have severe pain in your chest or abdomen. You vomit repeatedly. You have trouble breathing. These symptoms may be an emergency. Get help right away. Call 911. Do not wait to see if the symptoms will go away. Do not drive yourself to the hospital. Summary Hypertension is when the force of blood pumping through your arteries is too strong. If this condition is not controlled, it may put you at risk for serious complications. Your personal target blood pressure may vary depending on your medical conditions,   your age, and other factors. For most people, a normal blood pressure is less than 120/80. Hypertension is managed by lifestyle changes, medicines, or both. Lifestyle changes to help manage hypertension include losing weight, eating a healthy, low-sodium diet, exercising more, stopping smoking, and limiting alcohol. This information is not intended to replace advice given to you by your health care provider. Make sure you discuss any questions you have with your health care  provider. Document Revised: 09/14/2020 Document Reviewed: 09/14/2020 Elsevier Patient Education  2023 Elsevier Inc.  

## 2021-07-26 ENCOUNTER — Telehealth (HOSPITAL_COMMUNITY): Payer: Self-pay | Admitting: *Deleted

## 2021-07-26 NOTE — Telephone Encounter (Signed)
Reaching out to patient to offer assistance regarding upcoming cardiac imaging study; pt verbalizes understanding of appt date/time, parking situation and where to check in, pre-test NPO status and medications ordered, and verified current allergies; name and call back number provided for further questions should they arise  Gordy Clement RN Navigator Cardiac Imaging [facility] [phone] office [phone] cell  Patient to take '15mg'$  ivabradine two hours prior to his cardiac CT scan. He is aware to arrive at 10:30am.

## 2021-07-27 ENCOUNTER — Ambulatory Visit (HOSPITAL_COMMUNITY)
Admission: RE | Admit: 2021-07-27 | Discharge: 2021-07-27 | Disposition: A | Discharge status: Home / Self Care | Payer: Self-pay | Source: Ambulatory Visit | Attending: Internal Medicine | Admitting: Internal Medicine

## 2021-07-27 DIAGNOSIS — R072 Precordial pain: Secondary | ICD-10-CM

## 2021-07-27 MED ORDER — IOHEXOL 350 MG/ML SOLN
100.0000 mL | Freq: Once | INTRAVENOUS | Status: AC | PRN
  Administered 2021-07-27: 100 mL via INTRAVENOUS

## 2021-07-27 MED ORDER — DILTIAZEM HCL 25 MG/5ML IV SOLN: 5.0000 mg | INTRAVENOUS | Status: DC | PRN

## 2021-07-27 MED ORDER — NITROGLYCERIN 0.4 MG SL SUBL
0.8000 mg | SUBLINGUAL_TABLET | Freq: Once | SUBLINGUAL | Status: AC
  Administered 2021-07-27: 0.8 mg via SUBLINGUAL

## 2021-07-27 MED ORDER — NITROGLYCERIN 0.4 MG SL SUBL
SUBLINGUAL_TABLET | SUBLINGUAL | Status: AC
  Filled 2021-07-27: qty 2

## 2021-07-30 ENCOUNTER — Telehealth: Payer: Self-pay | Admitting: Licensed Clinical Social Worker

## 2021-07-30 NOTE — Telephone Encounter (Signed)
H&V Care Navigation CSW Progress Note  Clinical Social Worker contacted patient by phone to f/u on resources sent. Pt confirms he received them but is confused about how to complete. LCSW shared that I can assist with pt completing paperwork tomorrow during his appointment with our team at [facility]. Pt appreciative of this offer and will bring paperwork to our office. I will make note to see tomorrow.  Patient is participating in a Managed Medicaid Plan:  No, self pay only  SDOH Screenings   Alcohol Screen: Not on file  Depression (PHQ2-9): Low Risk  (07/23/2021)   Depression (PHQ2-9)    PHQ-2 Score: 0  Financial Resource Strain: Medium Risk (07/19/2021)   Overall Financial Resource Strain (CARDIA)    Difficulty of Paying Living Expenses: Somewhat hard  Food Insecurity: No Food Insecurity (07/19/2021)   Hunger Vital Sign    Worried About Running Out of Food in the Last Year: Never true    Ran Out of Food in the Last Year: Never true  Housing: Low Risk  (07/19/2021)   Housing    Last Housing Risk Score: 0  Physical Activity: Not on file  Social Connections: Not on file  Stress: Not on file  Tobacco Use: High Risk (07/23/2021)   Patient History    Smoking Tobacco Use: Some Days    Smokeless Tobacco Use: Never    Passive Exposure: Not on file  Transportation Needs: No Transportation Needs (07/19/2021)   PRAPARE - Transportation    Lack of Transportation (Medical): No    Lack of Transportation (Non-Medical): No   Westley Hummer, MSW, [place]  [phone]- work cell phone (preferred) [phone]- desk phone

## 2021-07-31 ENCOUNTER — Encounter: Payer: Self-pay | Admitting: Nurse Practitioner

## 2021-07-31 ENCOUNTER — Ambulatory Visit (INDEPENDENT_AMBULATORY_CARE_PROVIDER_SITE_OTHER): Payer: Self-pay | Admitting: Nurse Practitioner

## 2021-07-31 VITALS — BP 132/82 | HR 81 | Ht 69.0 in | Wt 229.8 lb

## 2021-07-31 DIAGNOSIS — J449 Chronic obstructive pulmonary disease, unspecified: Secondary | ICD-10-CM

## 2021-07-31 DIAGNOSIS — I10 Essential (primary) hypertension: Secondary | ICD-10-CM

## 2021-07-31 DIAGNOSIS — E785 Hyperlipidemia, unspecified: Secondary | ICD-10-CM

## 2021-07-31 DIAGNOSIS — I2510 Atherosclerotic heart disease of native coronary artery without angina pectoris: Secondary | ICD-10-CM

## 2021-07-31 DIAGNOSIS — Z720 Tobacco use: Secondary | ICD-10-CM

## 2021-07-31 NOTE — Progress Notes (Signed)
Heart and Vascular Care Navigation  07/31/2021  [name] 20-Feb-1955 [id number]  Reason for Referral:  Patient is participating in a Managed Medicaid Plan: No self pay only Engaged with patient face to face for follow up visit for Heart and Vascular Care Coordination.                                                                                                   Assessment:      LCSW met with pt during his appt this morning to f/u on assistance applications. We completed both [employer] and [employer]. Pt shares that he feels that his mother wouldn't be able to do the letter of support and get it notarized so he is going to submit only for CAFA at this time. I will hold on to [facility] applications if he changes his mind. Pt and I discussed SHIIP counseling (pt has started receiving Medicare information including his card valid 11/14/21). Pt will reach out to them to schedule an appt to hopefully get his enrollment questions answered. Pt has my card and understands I remain available.                                  HRT/VAS Care Coordination     Patients Home Cardiology Office [place] Team Social Worker   Social Worker Name: Valeda Malm, [place] [facility] [phone]   Living arrangements for the past 2 months Single Family Home   Lives with: Significant Other; Parents   Patient Current Insurance Coverage Self-Pay   Patient Has Concern With Paying Medical Bills Yes   Patient Concerns With Medical Bills no coverage, ongoing medical work up   Medical Bill Referrals: CAFA/OC   Does Patient Have Prescription Coverage? No   Home Assistive Devices/Equipment None   DME [place].       Social History:                                                                             SDOH Screenings   Alcohol Screen: Not on file  Depression (PHQ2-9): Low Risk  (07/23/2021)   Depression (PHQ2-9)    PHQ-2 Score: 0  Financial  Resource Strain: Medium Risk (07/19/2021)   Overall Financial Resource Strain (CARDIA)    Difficulty of Paying Living Expenses: Somewhat hard  Food Insecurity: No Food Insecurity (07/19/2021)   Hunger Vital Sign    Worried About Running Out of Food in the Last Year: Never true    Ran Out of Food in the Last Year: Never true  Housing: Low Risk  (07/19/2021)   Housing    Last Housing Risk Score: 0  Physical Activity: Not on file  Social Connections: Not on file  Stress: Not on file  Tobacco Use: High Risk (07/31/2021)   Patient History    Smoking Tobacco Use: Some Days    Smokeless Tobacco Use: Never    Passive Exposure: Not on file  Transportation Needs: No Transportation Needs (07/19/2021)   PRAPARE - Transportation    Lack of Transportation (Medical): No    Lack of Transportation (Non-Medical): No    SDOH Interventions: Financial Resources:  Occupational hygienist for [employer]    Follow-up plan:   LCSW will submit CAFA when pt brings SNAP letter back to office. Pt aware I will f/u with him if I do not receive I in the next week or so. Pt agreeable. Provided with BP cuff, log, and med bag. Pt also leaves with SHIIP information and clarification on who to call to discuss enrollment when he is eligible.

## 2021-07-31 NOTE — Progress Notes (Signed)
Office Visit    Patient Name: [name] Date of Encounter: 07/31/2021  Primary Care Provider:  Charlott Rakes, MD Primary Cardiologist:  Elouise Munroe, MD  Chief Complaint    66 year old male with history of CAD, hypertension, hyperlipidemia, COPD, asthma, and tobacco use who presents for follow-up related to CAD.  Past Medical History    Past Medical History:  Diagnosis Date   Asthma    COPD (chronic obstructive pulmonary disease) (HCC)    Hx of adenomatous polyp of colon 01/13/2020   Hypertension    Past Surgical History:  Procedure Laterality Date   HAND SURGERY Bilateral left 9562,1308 Rt    Allergies  Allergies  Allergen Reactions   Codeine Itching   Penicillins Itching and Other (See Comments)    Has patient had a PCN reaction causing immediate rash, facial/tongue/throat swelling, SOB or lightheadedness with hypotension: No Has patient had a PCN reaction causing severe rash involving mucus membranes or skin necrosis: No Has patient had a PCN reaction that required hospitalization No Has patient had a PCN reaction occurring within the last 10 years: No If all of the above answers are "NO", then may proceed with Cephalosporin use.    History of Present Illness    66 year old male with the above past medical history including CAD, hypertension, hyperlipidemia, COPD, asthma, and tobacco use.   He was referred to Dr. Margaretann Loveless in June 2023 by his PCP in the setting of uncontrolled hypertension, hyperlipidemia, ongoing tobacco use, and concern for coronary artery disease.  He had a chest CT for lung cancer screening in June 2023 which was notable for presence of emphysema, aortic atherosclerosis and LAD coronary artery disease.  He was last seen in the office on 07/05/2021 was stable overall from a cardiac standpoint.  He did report shortness of breath improved by using his rescue inhaler.  Have a notable history of COPD well as medication and dietary  nonadherence.  Valsartan was increased in the setting of uncontrolled hypertension.  He underwent coronary CTA which revealed coronary calcium score of 57.4 (49th percentile for age and gender), mild nonobstructive CAD in LAD.  Additionally, pulmonary consult was recommended for going management of COPD.  He presents today for follow-up. Since his last visit he has been stable from a cardiac standpoint.  He denies any worsening dyspnea, denies symptoms concerning for angina.  He does have stable chronic dyspnea in the setting of COPD.  He is pending PFTs with his PCP and possible pulmonary referral.  He does admit to taking his BP medication inconsistently.  He states he "forgets" to take it.  He continues to smoke.  Other than his stable chronic dyspnea, he denies any additional concerns today.  Home Medications    Current Outpatient Medications  Medication Sig Dispense Refill   albuterol (VENTOLIN HFA) 108 (90 Base) MCG/ACT inhaler Inhale 2 puffs into the lungs every 6 (six) hours as needed for wheezing or shortness of breath. 18 g 2   atorvastatin (LIPITOR) 20 MG tablet Take 1 tablet (20 mg total) by mouth daily. 90 tablet 1   Budeson-Glycopyrrol-Formoterol (BREZTRI AEROSPHERE) 160-9-4.8 MCG/ACT AERO Inhale 2 puffs into the lungs 2 (two) times daily. 10.7 g 6   ipratropium-albuterol (DUONEB) 0.5-2.5 (3) MG/3ML SOLN TAKE 3 MLS BY NEBULIZATION EVERY 6 (SIX) HOURS AS NEEDED (SEVERE WHEEZING). 360 mL 3   valsartan (DIOVAN) 160 MG tablet Take 1 tablet (160 mg total) by mouth daily. 90 tablet 3   No current facility-administered  medications for this visit.     Review of Systems   He denies chest pain, palpitations, pnd, orthopnea, n, v, dizziness, syncope, edema, weight gain, or early satiety. All other systems reviewed and are otherwise negative except as noted above.   Physical Exam    VS:  BP 132/82   Pulse 81   Ht '5\' 9"'$  (1.753 m)   Wt 229 lb 12.8 oz (104.2 kg)   SpO2 95%   BMI 33.94  kg/m   GEN: Well nourished, well developed, in no acute distress. HEENT: normal. Neck: Supple, no JVD, carotid bruits, or masses. Cardiac: RRR, no murmurs, rubs, or gallops. No clubbing, cyanosis, edema.  Radials/DP/PT 2+ and equal bilaterally.  Respiratory:  Respirations regular and unlabored, clear to auscultation bilaterally. GI: Soft, nontender, nondistended, BS + x 4. MS: no deformity or atrophy. Skin: warm and dry, no rash. Neuro:  Strength and sensation are intact. Psych: Normal affect.  Accessory Clinical Findings    ECG personally reviewed by me today - No EKG in office today.    Lab Results  Component Value Date   WBC 3.7 06/19/2021   HGB 15.1 06/19/2021   HCT 44.4 06/19/2021   MCV 93 06/19/2021   PLT 179 06/19/2021   Lab Results  Component Value Date   CREATININE 0.88 06/19/2021   BUN 12 06/19/2021   NA 142 06/19/2021   K 4.0 06/19/2021   CL 104 06/19/2021   CO2 25 06/19/2021   Lab Results  Component Value Date   ALT 18 06/19/2021   AST 21 06/19/2021   ALKPHOS 97 06/19/2021   BILITOT 0.3 06/19/2021   Lab Results  Component Value Date   CHOL 183 06/19/2021   HDL 50 06/19/2021   LDLCALC 123 (H) 06/19/2021   TRIG 50 06/19/2021   CHOLHDL 4.0 04/27/2020    Lab Results  Component Value Date   HGBA1C 5.9 (H) 04/27/2020    Assessment & Plan    1. CAD: Coronary CTA which revealed coronary calcium score of 57.4 (49th percentile for age and gender), mild nonobstructive CAD in LAD.  He does have stable chronic dyspnea in the setting of COPD.  Otherwise, stable with no anginal symptoms.  Continue valsartan, Lipitor.  2. Hypertension: BP initially elevated upon arrival today, improved with recheck.  Provided BP cuff for ongoing monitoring at home.  Encouraged medication adherence.  Continue valsartan.   3. Hyperlipidemia: LDL was 123 in June 2023.  Recently started on Lipitor per PCP.  He will be due for repeat lipids, LFTs per PCP in 6 to 8 weeks.  LDL goal  <70.  If LDL remains elevated above 70, consider escalation of statin therapy.   4. COPD: Stable chronic dyspnea.  He is pending PFTs per PCP and possible pulmonary referral.  5. Tobacco use: He continues to smoke.  Full cessation advised.  6. Disposition: Follow-up in 5 months.  Lenna Sciara, NP 07/31/2021, 9:52 AM

## 2021-07-31 NOTE — Patient Instructions (Signed)
Medication Instructions:  Your physician recommends that you continue on your current medications as directed. Please refer to the Current Medication list given to you today.   *If you need a refill on your cardiac medications before your next appointment, please call your pharmacy*   Lab Work: NONE ordered at this time of appointment   If you have labs (blood work) drawn today and your tests are completely normal, you will receive your results only by: [place] (if you have MyChart) OR A paper copy in the mail If you have any lab test that is abnormal or we need to change your treatment, we will call you to review the results.   Testing/Procedures: NONE ordered at this time of appointment     Follow-Up: At [facility], you and your health needs are our priority.  As part of our continuing mission to provide you with exceptional heart care, we have created designated Provider Care Teams.  These Care Teams include your primary Cardiologist (physician) and Advanced Practice Providers (APPs -  Physician Assistants and Nurse Practitioners) who all work together to provide you with the care you need, when you need it.  We recommend signing up for the patient portal called "MyChart".  Sign up information is provided on this After Visit Summary.  MyChart is used to connect with patients for Virtual Visits (Telemedicine).  Patients are able to view lab/test results, encounter notes, upcoming appointments, etc.  Non-urgent messages can be sent to your provider as well.   To learn more about what you can do with MyChart, go to [email or web address].    Your next appointment:   5 month(s)  The format for your next appointment:   In Person  Provider:   Elouise Munroe, MD  or Diona Browner, NP        Other Instructions Blood pressure log and cuff given. Monitor Blood pressure.  Important Information About Sugar

## 2021-08-06 ENCOUNTER — Other Ambulatory Visit: Payer: Self-pay

## 2021-08-07 ENCOUNTER — Telehealth: Payer: Self-pay | Admitting: Licensed Clinical Social Worker

## 2021-08-07 NOTE — Telephone Encounter (Signed)
H&V Care Navigation CSW Progress Note  Clinical Social Worker contacted patient by phone to f/u on missing SNAP letter. When we receive this we can submit pt application. Was able to reach pt at [phone], reminded him of above. Pt began looking for paper when we were on the phone and will bring by [facility] office when he is able to locate it. If I dont hear from pt/receive paper this week we discussed I would provide another phone reminder next week.   Patient is participating in a Managed Medicaid Plan:  No, self pay only.   SDOH Screenings   Alcohol Screen: Not on file  Depression (PHQ2-9): Low Risk  (07/23/2021)   Depression (PHQ2-9)    PHQ-2 Score: 0  Financial Resource Strain: Medium Risk (07/19/2021)   Overall Financial Resource Strain (CARDIA)    Difficulty of Paying Living Expenses: Somewhat hard  Food Insecurity: No Food Insecurity (07/19/2021)   Hunger Vital Sign    Worried About Running Out of Food in the Last Year: Never true    Ran Out of Food in the Last Year: Never true  Housing: Low Risk  (07/19/2021)   Housing    Last Housing Risk Score: 0  Physical Activity: Not on file  Social Connections: Not on file  Stress: Not on file  Tobacco Use: High Risk (07/31/2021)   Patient History    Smoking Tobacco Use: Some Days    Smokeless Tobacco Use: Never    Passive Exposure: Not on file  Transportation Needs: No Transportation Needs (07/19/2021)   PRAPARE - Transportation    Lack of Transportation (Medical): No    Lack of Transportation (Non-Medical): No   [name], MSW, [place]  [phone]- work cell phone (preferred) [phone]- desk phone

## 2021-08-23 ENCOUNTER — Other Ambulatory Visit: Payer: Self-pay

## 2021-09-04 ENCOUNTER — Telehealth (HOSPITAL_COMMUNITY): Payer: Self-pay | Admitting: Licensed Clinical Social Worker

## 2021-09-04 NOTE — Telephone Encounter (Signed)
CSW attempted to call pt regarding pending CAFA which still requires letter of nonfiling to complete- CSW left message for pt requesting return call to discuss  [name], [place] Worker [facility] Desk#: [phone] Cell#: [phone]

## 2021-09-05 ENCOUNTER — Telehealth (HOSPITAL_COMMUNITY): Payer: Self-pay | Admitting: Licensed Clinical Social Worker

## 2021-09-05 NOTE — Telephone Encounter (Signed)
CSW spoke with pt and informed of need to get nonfiling form to complete his CAFA- informed he needed to submit by 9/1 to avoid application being thrown out.  Pt will plan to go to local IRS office to request form- will contact [facility] CSW once he has obtained  Jorge Ny, [facility] Desk#: [phone] Cell#: [phone]

## 2021-09-06 ENCOUNTER — Telehealth: Payer: Self-pay | Admitting: Licensed Clinical Social Worker

## 2021-09-06 NOTE — Telephone Encounter (Signed)
H&V Care Navigation CSW Progress Note  Clinical Social Worker  was contacted by pt  to update me that he will bring me his letter of nonfiling today. Once received I can submit to [facility] patient accounting department.  Patient is participating in a Managed Medicaid Plan:  No, self pay only.   SDOH Screenings   Alcohol Screen: Not on file  Depression (PHQ2-9): Low Risk  (07/23/2021)   Depression (PHQ2-9)    PHQ-2 Score: 0  Financial Resource Strain: Medium Risk (07/19/2021)   Overall Financial Resource Strain (CARDIA)    Difficulty of Paying Living Expenses: Somewhat hard  Food Insecurity: No Food Insecurity (07/19/2021)   Hunger Vital Sign    Worried About Running Out of Food in the Last Year: Never true    Ran Out of Food in the Last Year: Never true  Housing: Low Risk  (07/19/2021)   Housing    Last Housing Risk Score: 0  Physical Activity: Not on file  Social Connections: Not on file  Stress: Not on file  Tobacco Use: High Risk (07/31/2021)   Patient History    Smoking Tobacco Use: Some Days    Smokeless Tobacco Use: Never    Passive Exposure: Not on file  Transportation Needs: No Transportation Needs (07/19/2021)   PRAPARE - Transportation    Lack of Transportation (Medical): No    Lack of Transportation (Non-Medical): No   Westley Hummer, MSW, [place]  [phone]- work cell phone (preferred) [phone]- desk phone

## 2021-09-06 NOTE — Telephone Encounter (Signed)
H&V Care Navigation CSW Progress Note  Clinical Social Worker  received paperwork  to submit for pt [employer] . I called and let him know I have submitted it and reviewed process at [employer]. Emailed securely the paperwork to Elliot Gault with patient accounting.   Patient is participating in a Managed Medicaid Plan:  No, self pay only, CAFA pending  SDOH Screenings   Alcohol Screen: Not on file  Depression (PHQ2-9): Low Risk  (07/23/2021)   Depression (PHQ2-9)    PHQ-2 Score: 0  Financial Resource Strain: Medium Risk (07/19/2021)   Overall Financial Resource Strain (CARDIA)    Difficulty of Paying Living Expenses: Somewhat hard  Food Insecurity: No Food Insecurity (07/19/2021)   Hunger Vital Sign    Worried About Running Out of Food in the Last Year: Never true    Ran Out of Food in the Last Year: Never true  Housing: Low Risk  (07/19/2021)   Housing    Last Housing Risk Score: 0  Physical Activity: Not on file  Social Connections: Not on file  Stress: Not on file  Tobacco Use: High Risk (07/31/2021)   Patient History    Smoking Tobacco Use: Some Days    Smokeless Tobacco Use: Never    Passive Exposure: Not on file  Transportation Needs: No Transportation Needs (07/19/2021)   PRAPARE - Transportation    Lack of Transportation (Medical): No    Lack of Transportation (Non-Medical): No    Westley Hummer, MSW, [place]  [phone]- work cell phone (preferred) [phone]- desk phone

## 2021-09-14 ENCOUNTER — Other Ambulatory Visit: Payer: Self-pay | Admitting: Pharmacist

## 2021-09-14 ENCOUNTER — Other Ambulatory Visit: Payer: Self-pay

## 2021-09-14 MED ORDER — ALBUTEROL SULFATE HFA 108 (90 BASE) MCG/ACT IN AERS
2.0000 | INHALATION_SPRAY | Freq: Four times a day (QID) | RESPIRATORY_TRACT | 2 refills | Status: DC | PRN
  Filled 2021-09-14: qty 6.7, 17d supply, fill #0
  Filled 2021-10-17: qty 6.7, 17d supply, fill #1

## 2021-09-18 ENCOUNTER — Telehealth: Payer: Self-pay | Admitting: Licensed Clinical Social Worker

## 2021-09-18 NOTE — Telephone Encounter (Signed)
H&V Care Navigation CSW Progress Note  Clinical Social Worker  mailed pt approval letter   to pt regarding his 100% financial assistance approval. The approval is for the following:  CHARITY COMPLETE. PATIENT APPROVED FOR 100% FINANCIAL ASSISTANCE PER CAFA APPLICATION AND SUPPORTING DOCUMENTATION.  CAFA APP SCANNED TO ACCOUNT [id number] APPROVAL DATES OF FPL ---- 07/31/21 - 01/31/22  APPROVAL LETTER ON ACCOUNT [id number]  Patient is participating in a Managed Medicaid Plan:  No, self pay only (CAFA approved 100%)  SDOH Screenings   Food Insecurity: No Food Insecurity (07/19/2021)  Housing: Low Risk  (07/19/2021)  Transportation Needs: No Transportation Needs (07/19/2021)  Depression (PHQ2-9): Low Risk  (07/23/2021)  Financial Resource Strain: Medium Risk (07/19/2021)  Tobacco Use: High Risk (07/31/2021)    Westley Hummer, MSW, [place]  [phone]- work cell phone (preferred) [phone]- desk phone

## 2021-10-17 ENCOUNTER — Other Ambulatory Visit: Payer: Self-pay

## 2021-10-18 ENCOUNTER — Other Ambulatory Visit: Payer: Self-pay

## 2021-10-19 ENCOUNTER — Other Ambulatory Visit: Payer: Self-pay | Admitting: Pharmacist

## 2021-10-19 ENCOUNTER — Other Ambulatory Visit: Payer: Self-pay

## 2021-10-19 MED ORDER — ALBUTEROL SULFATE HFA 108 (90 BASE) MCG/ACT IN AERS
2.0000 | INHALATION_SPRAY | Freq: Four times a day (QID) | RESPIRATORY_TRACT | 1 refills | Status: DC | PRN
  Filled 2021-10-19: qty 18, fill #0
  Filled 2021-11-29: qty 18, 25d supply, fill #0
  Filled 2022-01-15: qty 18, 25d supply, fill #1

## 2021-10-29 ENCOUNTER — Ambulatory Visit: Payer: Self-pay | Admitting: Family Medicine

## 2021-11-01 ENCOUNTER — Telehealth: Payer: Self-pay | Admitting: Licensed Clinical Social Worker

## 2021-11-01 NOTE — Telephone Encounter (Signed)
H&V Care Navigation CSW Progress Note  Clinical Social Worker contacted patient by phone to f/u on upcoming Medicare enrollment. Pt had been encouraged to speak with a [employer]II[employer] counselor when we assisted him with CAFA as he is turning 83 next month. Not clear if that has been done. Was able to reach pt this morning at [phone]. Re-introduced self, role, reason for call. I explained the above- he requests this information be mailed to him again. I will do so. Mailed SHIIP flyer, Extra Help flyer, information about Medicare and Open Enrollment which has started. I remain available.   Patient is participating in a Managed Medicaid Plan:  No, self pay, CAFA approved- rec to enrol in Medicare.   SDOH Screenings   Food Insecurity: No Food Insecurity (07/19/2021)  Housing: Low Risk  (07/19/2021)  Transportation Needs: No Transportation Needs (07/19/2021)  Depression (PHQ2-9): Low Risk  (07/23/2021)  Financial Resource Strain: Medium Risk (07/19/2021)  Tobacco Use: High Risk (07/31/2021)    Westley Hummer, MSW, [place]  [phone]- work cell phone (preferred) [phone]- desk phone

## 2021-11-29 ENCOUNTER — Other Ambulatory Visit: Payer: Self-pay

## 2021-12-04 ENCOUNTER — Other Ambulatory Visit: Payer: Self-pay

## 2022-01-04 ENCOUNTER — Ambulatory Visit: Payer: Self-pay | Admitting: Internal Medicine

## 2022-01-15 ENCOUNTER — Other Ambulatory Visit: Payer: Self-pay

## 2022-01-30 ENCOUNTER — Ambulatory Visit: Payer: Self-pay | Attending: Family Medicine | Admitting: Family Medicine

## 2022-01-30 ENCOUNTER — Encounter: Payer: Self-pay | Admitting: Family Medicine

## 2022-01-30 ENCOUNTER — Other Ambulatory Visit: Payer: Self-pay

## 2022-01-30 VITALS — BP 157/87 | HR 91 | Temp 98.0°F | Ht 69.0 in | Wt 225.6 lb

## 2022-01-30 DIAGNOSIS — Z720 Tobacco use: Secondary | ICD-10-CM

## 2022-01-30 DIAGNOSIS — I10 Essential (primary) hypertension: Secondary | ICD-10-CM

## 2022-01-30 DIAGNOSIS — J449 Chronic obstructive pulmonary disease, unspecified: Secondary | ICD-10-CM

## 2022-01-30 DIAGNOSIS — E7800 Pure hypercholesterolemia, unspecified: Secondary | ICD-10-CM | POA: Diagnosis not present

## 2022-01-30 MED ORDER — BUPROPION HCL ER (XL) 150 MG PO TB24
150.0000 mg | ORAL_TABLET | Freq: Every day | ORAL | 1 refills | Status: DC
  Filled 2022-01-30 – 2022-02-08 (×2): qty 90, 90d supply, fill #0

## 2022-01-30 MED ORDER — MISC. DEVICES MISC
0 refills | Status: DC
  Filled 2022-01-30: qty 1, fill #0

## 2022-01-30 MED ORDER — CHLORTHALIDONE 25 MG PO TABS
25.0000 mg | ORAL_TABLET | Freq: Every day | ORAL | 1 refills | Status: DC
  Filled 2022-01-30 – 2022-02-08 (×2): qty 90, 90d supply, fill #0

## 2022-01-30 MED ORDER — IPRATROPIUM-ALBUTEROL 0.5-2.5 (3) MG/3ML IN SOLN
3.0000 mL | Freq: Four times a day (QID) | RESPIRATORY_TRACT | 3 refills | Status: DC | PRN
  Filled 2022-01-30: qty 90, 8d supply, fill #0
  Filled 2022-02-08: qty 360, 30d supply, fill #0
  Filled 2022-05-13: qty 90, 8d supply, fill #1

## 2022-01-30 MED ORDER — MISC. DEVICES MISC: 0 refills | Status: DC

## 2022-01-30 NOTE — Progress Notes (Signed)
Refill on inhaler

## 2022-01-30 NOTE — Patient Instructions (Signed)

## 2022-01-30 NOTE — Progress Notes (Signed)
Subjective:  Patient ID: [name], male    DOB: 1955-11-08  Age: 67 y.o. MRN: [id number]  CC: Hypertension   HPI Yann Biehn is a 67 y.o. year old male with a history of hypertension, hyperlipidemia, COPD, tobacco dependence (greater than 40-pack-year) here for follow-up visit.   Interval History:  Since the weather turned cold he has needed the MDI more. He uses Librarian, academic as well. His symptoms flare up with extreme cold and extreme heat. He has been coughing a lot as well but no wheezing and he has no chest pain. He had quit smoking for 4 months last year but then restrated again. Now smokes a pack Cigarettes /day.  Not sure he is ready to quit smoking.    Endorses adherence with his statin and antihypertensive but his blood pressure is elevated. He has no additional concerns today. Past Medical History:  Diagnosis Date   Asthma    COPD (chronic obstructive pulmonary disease) ([place])    Hx of adenomatous polyp of colon 01/13/2020   Hypertension     Past Surgical History:  Procedure Laterality Date   HAND SURGERY Bilateral left 2671,2458 Rt    Family History  Problem Relation Age of Onset   Hypertension Paternal Uncle    Cancer Maternal Grandmother    Hypertension Maternal Grandfather    Colon cancer Cousin    Colon polyps Neg Hx    Esophageal cancer Neg Hx    Stomach cancer Neg Hx    Rectal cancer Neg Hx     Social History   Socioeconomic History   Marital status: Significant Other    Spouse name: Not on file   Number of children: Not on file   Years of education: Not on file   Highest education level: Not on file  Occupational History   Not on file  Tobacco Use   Smoking status: Some Days    Packs/day: 1.00    Years: 45.00    Total pack years: 45.00    Types: Cigarettes   Smokeless tobacco: Never  Vaping Use   Vaping Use: Never used  Substance and Sexual Activity   Alcohol use: Yes    Alcohol/week: 1.0 standard drink of alcohol     Types: 1 Standard drinks or equivalent per week    Comment: "every once in awhile"-3 times a month per pt   Drug use: Yes    Frequency: 1.0 times per week    Types: Marijuana    Comment: occasionally marijuana-3 times a month per pt   Sexual activity: Not on file  Other Topics Concern   Not on file  Social History Narrative   Not on file   Social Determinants of Health   Financial Resource Strain: Medium Risk (07/19/2021)   Overall Financial Resource Strain (CARDIA)    Difficulty of Paying Living Expenses: Somewhat hard  Food Insecurity: No Food Insecurity (07/19/2021)   Hunger Vital Sign    Worried About Running Out of Food in the Last Year: Never true    Ran Out of Food in the Last Year: Never true  Transportation Needs: No Transportation Needs (07/19/2021)   PRAPARE - Hydrologist (Medical): No    Lack of Transportation (Non-Medical): No  Physical Activity: Not on file  Stress: Not on file  Social Connections: Not on file    Allergies  Allergen Reactions   Codeine Itching   Penicillins Itching and Other (See Comments)    Has  patient had a PCN reaction causing immediate rash, facial/tongue/throat swelling, SOB or lightheadedness with hypotension: No Has patient had a PCN reaction causing severe rash involving mucus membranes or skin necrosis: No Has patient had a PCN reaction that required hospitalization No Has patient had a PCN reaction occurring within the last 10 years: No If all of the above answers are "NO", then may proceed with Cephalosporin use.    Outpatient Medications Prior to Visit  Medication Sig Dispense Refill   albuterol (VENTOLIN HFA) 108 (90 Base) MCG/ACT inhaler Inhale 2 puffs into the lungs every 6 (six) hours as needed for wheezing or shortness of breath. 18 g 1   atorvastatin (LIPITOR) 20 MG tablet Take 1 tablet (20 mg total) by mouth daily. 90 tablet 1   Budeson-Glycopyrrol-Formoterol (BREZTRI AEROSPHERE) 160-9-4.8 MCG/ACT  AERO Inhale 2 puffs into the lungs 2 (two) times daily. 10.7 g 6   valsartan (DIOVAN) 160 MG tablet Take 1 tablet (160 mg total) by mouth daily. 90 tablet 3   ipratropium-albuterol (DUONEB) 0.5-2.5 (3) MG/3ML SOLN TAKE 3 MLS BY NEBULIZATION EVERY 6 (SIX) HOURS AS NEEDED (SEVERE WHEEZING). 360 mL 3   No facility-administered medications prior to visit.     ROS Review of Systems  Constitutional:  Negative for activity change and appetite change.  HENT:  Negative for sinus pressure and sore throat.   Respiratory:  Negative for chest tightness, shortness of breath and wheezing.   Cardiovascular:  Negative for chest pain and palpitations.  Gastrointestinal:  Negative for abdominal distention, abdominal pain and constipation.  Genitourinary: Negative.   Musculoskeletal: Negative.   Psychiatric/Behavioral:  Negative for behavioral problems and dysphoric mood.     Objective:  BP (!) 157/87   Pulse 91   Temp 98 F (36.7 C) (Oral)   Ht '5\' 9"'$  (1.753 m)   Wt 225 lb 9.6 oz (102.3 kg)   SpO2 95%   BMI 33.32 kg/m      01/30/2022    2:46 PM 01/30/2022    2:07 PM 07/31/2021    9:44 AM  BP/Weight  Systolic BP [phone]  Diastolic BP 87 89 82  Wt. (Lbs)  225.6   BMI  33.32 kg/m2       Physical Exam Constitutional:      Appearance: He is well-developed.  Cardiovascular:     Rate and Rhythm: Normal rate.     Heart sounds: Normal heart sounds. No murmur heard. Pulmonary:     Effort: Pulmonary effort is normal.     Breath sounds: Normal breath sounds. No wheezing or rales.  Chest:     Chest wall: No tenderness.  Abdominal:     General: Bowel sounds are normal. There is no distension.     Palpations: Abdomen is soft. There is no mass.     Tenderness: There is no abdominal tenderness.  Musculoskeletal:        General: Normal range of motion.     Right lower leg: No edema.     Left lower leg: No edema.  Neurological:     Mental Status: He is alert and oriented to person, place,  and time.  Psychiatric:        Mood and Affect: Mood normal.        Latest Ref Rng & Units 06/19/2021    9:17 AM 04/27/2020    4:59 PM 07/08/2019    9:10 AM  CMP  Glucose 70 - 99 mg/dL 95  145  94  BUN 8 - 27 mg/dL '12  6  8   '$ Creatinine 0.76 - 1.27 mg/dL 0.88  0.87  0.94   Sodium 134 - 144 mmol/L 142  141  140   Potassium 3.5 - 5.2 mmol/L 4.0  3.8  4.1   Chloride 96 - 106 mmol/L 104  105  101   CO2 20 - 29 mmol/L '25  23  28   '$ Calcium 8.6 - 10.2 mg/dL 9.2  8.5  9.1   Total Protein 6.0 - 8.5 g/dL 6.4  6.1  6.5   Total Bilirubin 0.0 - 1.2 mg/dL 0.3  0.7  0.5   Alkaline Phos 44 - 121 IU/L 97  100  106   AST 0 - 40 IU/L '21  24  16   '$ ALT 0 - 44 IU/L '18  20  13     '$ Lipid Panel     Component Value Date/Time   CHOL 183 06/19/2021 0917   TRIG 50 06/19/2021 0917   HDL 50 06/19/2021 0917   CHOLHDL 4.0 04/27/2020 1659   CHOLHDL 4.8 07/15/2013 1152   VLDL 19 07/15/2013 1152   LDLCALC 123 (H) 06/19/2021 0917    CBC    Component Value Date/Time   WBC 3.7 06/19/2021 0917   WBC 5.8 06/11/2017 1630   RBC 4.78 06/19/2021 0917   RBC 4.70 06/11/2017 1630   HGB 15.1 06/19/2021 0917   HCT 44.4 06/19/2021 0917   PLT 179 06/19/2021 0917   MCV 93 06/19/2021 0917   MCH 31.6 06/19/2021 0917   MCH 30.9 06/11/2017 1630   MCHC 34.0 06/19/2021 0917   MCHC 33.2 06/11/2017 1630   RDW 13.2 06/19/2021 0917   LYMPHSABS 1.6 06/19/2021 0917   MONOABS 1.1 (H) 06/11/2017 1630   EOSABS 0.1 06/19/2021 0917   BASOSABS 0.0 06/19/2021 0917    Lab Results  Component Value Date   HGBA1C 5.9 (H) 04/27/2020    Assessment & Plan:  1. COPD mixed type ([place]) Intermittent exacerbations which have worsened with the cold weather Pulmonary exam is normal Will write prescription for nebulized Advised that smoking cessation will be beneficial - ipratropium-albuterol (DUONEB) 0.5-2.5 (3) MG/3ML SOLN; TAKE 3 MLS BY NEBULIZATION EVERY 6 (SIX) HOURS AS NEEDED (SEVERE WHEEZING).  Dispense: 120 mL; Refill: 3 -  Misc. Devices MISC; Nebulizer device.  Diagnosis COPD  Dispense: 1 each; Refill: 0  2. Pure hypercholesterolemia Uncontrolled He is on a statin Will repeat lipid panel at next visit Low-cholesterol diet - CMP14+EGFR  3. Tobacco abuse Spent 3 minutes counseling on smoking cessation but he is not ready to quit but is willing to try bupropion - buPROPion (WELLBUTRIN XL) 150 MG 24 hr tablet; Take 1 tablet (150 mg total) by mouth daily. For smoking cessation  Dispense: 90 tablet; Refill: 1  4. Essential hypertension Uncontrolled Chlorthalidone added to regimen He will see the clinical pharmacist in 1 month and also have his electrolytes checked at that visit Continue valsartan Counseled on blood pressure goal of less than 130/80, low-sodium, DASH diet, medication compliance, 150 minutes of moderate intensity exercise per week. Discussed medication compliance, adverse effects. - chlorthalidone (HYGROTON) 25 MG tablet; Take 1 tablet (25 mg total) by mouth daily.  Dispense: 90 tablet; Refill: 1    Meds ordered this encounter  Medications   ipratropium-albuterol (DUONEB) 0.5-2.5 (3) MG/3ML SOLN    Sig: TAKE 3 MLS BY NEBULIZATION EVERY 6 (SIX) HOURS AS NEEDED (SEVERE WHEEZING).    Dispense:  120 mL  Refill:  3   DISCONTD: Misc. Devices MISC    Sig: Nebulizer device.  Diagnosis COPD    Dispense:  1 each    Refill:  0   buPROPion (WELLBUTRIN XL) 150 MG 24 hr tablet    Sig: Take 1 tablet (150 mg total) by mouth daily. For smoking cessation    Dispense:  90 tablet    Refill:  1   chlorthalidone (HYGROTON) 25 MG tablet    Sig: Take 1 tablet (25 mg total) by mouth daily.    Dispense:  90 tablet    Refill:  1   Misc. Devices MISC    Sig: Nebulizer device.  Diagnosis COPD    Dispense:  1 each    Refill:  0    Follow-up: Return in about 1 month (around 03/02/2022) for Blood pressure follow-up with Lurena Joiner.  6 months visit with PCP for all conditions.       Charlott Rakes, MD,  FAAFP. [facility] and [place], [place]   01/30/2022, 5:53 PM

## 2022-01-31 LAB — CMP14+EGFR
ALT: 15 IU/L (ref 0–44)
AST: 18 IU/L (ref 0–40)
Albumin/Globulin Ratio: 1.8 (ref 1.2–2.2)
Albumin: 4.1 g/dL (ref 3.9–4.9)
Alkaline Phosphatase: 102 IU/L (ref 44–121)
BUN/Creatinine Ratio: 10 (ref 10–24)
BUN: 9 mg/dL (ref 8–27)
Bilirubin Total: 0.5 mg/dL (ref 0.0–1.2)
CO2: 27 mmol/L (ref 20–29)
Calcium: 9 mg/dL (ref 8.6–10.2)
Chloride: 103 mmol/L (ref 96–106)
Creatinine, Ser: 0.91 mg/dL (ref 0.76–1.27)
Globulin, Total: 2.3 g/dL (ref 1.5–4.5)
Glucose: 88 mg/dL (ref 70–99)
Potassium: 4 mmol/L (ref 3.5–5.2)
Sodium: 142 mmol/L (ref 134–144)
Total Protein: 6.4 g/dL (ref 6.0–8.5)
eGFR: 94 mL/min/{1.73_m2} (ref >59)

## 2022-02-06 ENCOUNTER — Other Ambulatory Visit: Payer: Self-pay

## 2022-02-08 ENCOUNTER — Other Ambulatory Visit: Payer: Self-pay

## 2022-02-15 ENCOUNTER — Other Ambulatory Visit: Payer: Self-pay | Admitting: Family Medicine

## 2022-02-15 ENCOUNTER — Other Ambulatory Visit: Payer: Self-pay

## 2022-02-15 MED ORDER — ALBUTEROL SULFATE HFA 108 (90 BASE) MCG/ACT IN AERS
2.0000 | INHALATION_SPRAY | Freq: Four times a day (QID) | RESPIRATORY_TRACT | 1 refills | Status: DC | PRN
  Filled 2022-02-15: qty 18, 25d supply, fill #0
  Filled 2022-03-14: qty 18, 25d supply, fill #1

## 2022-03-04 ENCOUNTER — Ambulatory Visit: Payer: Self-pay | Admitting: Pharmacist

## 2022-03-14 ENCOUNTER — Other Ambulatory Visit: Payer: Self-pay

## 2022-03-28 ENCOUNTER — Telehealth: Payer: Self-pay

## 2022-03-28 NOTE — Telephone Encounter (Signed)
Patient attempted to be outreached by Estelle June, PharmD Candidate on 03/28/22 to discuss hypertension. Left voicemail for patient to return our call at their convenience at [phone].   Estelle June, PharmD Candidate 2026  [facility]   Maryan Puls, PharmD PGY-1 [facility] Resident

## 2022-04-10 ENCOUNTER — Other Ambulatory Visit: Payer: Self-pay

## 2022-04-10 ENCOUNTER — Other Ambulatory Visit: Payer: Self-pay | Admitting: Family Medicine

## 2022-04-10 MED ORDER — ALBUTEROL SULFATE HFA 108 (90 BASE) MCG/ACT IN AERS
2.0000 | INHALATION_SPRAY | Freq: Four times a day (QID) | RESPIRATORY_TRACT | 0 refills | Status: DC | PRN
  Filled 2022-04-10: qty 18, 25d supply, fill #0
  Filled 2022-04-30: qty 18, 25d supply, fill #1

## 2022-04-10 NOTE — Progress Notes (Signed)
Patient outreached by Park Liter, PharmD Candidate on 04/10/22 to discuss hypertension   Patient has an automated home blood pressure machine, but does not use it.   Medication review was performed. They are not taking medications as prescribed. Differences from their prescribed list include: does not have any valsartan at home. Only has chlorthalidone and bupropion, and he is not taking either of them.   Discussed at length with pt about reasoning for wanting to control his blood pressure. Pt is hesitant to put "chemicals" in his body and feels that taking medicines will do more harm than good. Explained that genetics, environment, and lifestyle can lead natural systems in the body to become dysregulated and perpetuate high blood pressure, and that medicines help to restore the body to its healthy state. Explained that our goal is to prevent him from having a heart attack or stroke and keep him out of the hospital. Pt also is hesitant to restart medicines after experiencing dizziness in the past with blood pressure medicines (amlodipine, per chart review). 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 Discussed that continued smoking is likely a contributor to his HTN, and if he were to quit he may not need as much medicine. He expressed a desire to quit, supported by his fiance. Reports quitting for 4 months last year, but restarting (1 ppd) after a stressful circumstance. Pt previously used Nicorette gum to quit, which did curb his cravings. Had a skin reaction to the patch, and has not tried lozenges. Pt recently obtained Medicaid insurance.   The following barriers to adherence were noted:  - They do not have cost concerns.  - They do not have transportation concerns.  - They do need assistance obtaining refills. Requested refill of albuterol.  - They do not take their medications due to concern for AE and safety.  - They do feel like one/some of their medications  make them feel poorly (dizziness in the past with HTN meds).  - They do have questions or concerns about their medications.  - They do have follow up scheduled with their primary care provider/cardiologist, but cancelled interval f/u appt with CPP Daisy Blossom on 03/04/22.   The following interventions were completed:  - Patient was educated on goal blood pressures and long term health implications of elevated blood pressure. Pt agreed to try taking chlorthalidone 25 mg daily for one week, and record any side effects he experiences. Educated pt to take this medication in the morning as it may increase urinary frequency. Informed him that it is important that he keeps his follow-up appt to check his labwork after starting this medication.  - Reached out to [facility] to reschedule pt appointment with CPP Daisy Blossom on 4/22 at 10:30AM. Pt confirmed this date/time would work with his schedule.  - Patient was counseled on benefit of tobacco cessation. With recently obtained medicaid, informed pt that he would qualify for up to three months of free nicotine replacement therapy. Provided pt with [employer] Quitline phone # and instructions to call. Pt receptive and willing to call.  - Helped coordinate albuterol refill at [facility]   The patient has follow up scheduled:  PCP: Margarita Rana 08/01/22   Park Liter, PharmD Candidate   Joseph Art, Pharm.D. PGY-2 [facility] Pharm[facility] Resident

## 2022-04-26 ENCOUNTER — Other Ambulatory Visit: Payer: Self-pay

## 2022-04-30 ENCOUNTER — Other Ambulatory Visit: Payer: Self-pay

## 2022-05-06 ENCOUNTER — Encounter: Payer: Self-pay | Admitting: Pharmacist

## 2022-05-06 ENCOUNTER — Ambulatory Visit: Payer: Medicare HMO | Attending: Family Medicine | Admitting: Pharmacist

## 2022-05-06 VITALS — BP 151/83 | HR 85

## 2022-05-06 DIAGNOSIS — I10 Essential (primary) hypertension: Secondary | ICD-10-CM | POA: Diagnosis not present

## 2022-05-06 NOTE — Progress Notes (Signed)
S:    PCP: Dr. Alvis Lemmings  67 y.o. male who presents for hypertension evaluation, education, and management.  PMH is significant for HTN, HLD, COPD, tob dependence.  Patient was last seen by Primary Care Provider, Dr. Alvis Lemmings, on 01/30/2022. Patient contacted by the student pharmacist regarding BP on 04/10/2022.  When he last spoke to a pharmacy student regarding BP, he reported not taking valsartan nor chlorthalidone. He reported being hesitant to put chemicals in his body.   Today, patient arrives in good spirits and presents without assistance. Denies dizziness, headache, blurred vision, swelling. Reports he knows his BP will be elevated today because he has had 8 cups of coffee and smoked several cigarettes today. Reports that he feels that side effects of medications are worse than uncontrolled hypertension. He said he took BP medications for 2 days after discussing with the student pharmacist, but stopped after 2 days due to feeling dizzy.    Patient reports hypertension was diagnosed in 2015.   Family/Social history:  ASCVD: none CKD: none Tobacco use: 0.5-1 PPD  Medication adherence denied. Patient denies taken BP medications today.   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   O:  ROS  Physical Exam  Last 3 Office BP readings: BP Readings from Last 3 Encounters:  05/06/22 (!) 151/83  01/30/22 (!) 157/87  07/31/21 132/82    BMET    Component Value Date/Time   NA 142 01/30/2022 1457   K 4.0 01/30/2022 1457   CL 103 01/30/2022 1457   CO2 27 01/30/2022 1457   GLUCOSE 88 01/30/2022 1457   GLUCOSE 170 (H) 06/12/2017 0402   BUN 9 01/30/2022 1457   CREATININE 0.91 01/30/2022  1457   CREATININE 1.05 03/12/2016 1500   CALCIUM 9.0 01/30/2022 1457   GFRNONAA 87 07/08/2019 0910   GFRNONAA 77 03/12/2016 1500   GFRAA 100 07/08/2019 0910   GFRAA 89 03/12/2016 1500    Renal function: CrCl cannot be calculated (Patient's most recent lab result is older than the maximum 21 days allowed.).  Clinical ASCVD: No  The 10-year ASCVD risk score (Arnett DK, et al., 2019) is: 33.4%   Values used to calculate the score:     Age: 13 years     Sex: Male     Is Non-Hispanic African American: Yes     Diabetic: No     Tobacco smoker: Yes     Systolic Blood Pressure: 151 mmHg     Is BP treated: Yes     HDL Cholesterol: 50 mg/dL     Total Cholesterol: 183 mg/dL   A/P: Hypertension longstanding, currently not controlled on current medications. BP goal < 130/80 mmHg. Medication adherence denied. Control is suboptimal due to patient being unwilling to use pharmacology.  -Recommended patient restart medications, however he reports he will not take BP medications due to them making him feel dizzy. Discussed ways to mitigate this, but patient remains unwilling to make changes today.   -Patient educated on purpose, proper use, and potential adverse effects of uncontrolled BP.  -Counseled on lifestyle modifications for blood pressure control including reduced dietary sodium, increased exercise, adequate sleep.  Results reviewed and written information provided.    Written patient instructions provided. Patient verbalized understanding  of treatment plan.  Total time in face to face counseling 20 minutes.    Follow-up:  Pharmacist PRN. PCP clinic visit in July 2024.   Valeda Malm, Pharm.D. PGY-2 Ambulatory Care Pharmacy Resident

## 2022-05-10 ENCOUNTER — Other Ambulatory Visit: Payer: Self-pay

## 2022-05-10 ENCOUNTER — Inpatient Hospital Stay (HOSPITAL_COMMUNITY)
Admission: EM | Admit: 2022-05-10 | Discharge: 2022-05-13 | DRG: 190 | Disposition: A | Discharge status: Home / Self Care | Payer: Medicare HMO | Attending: Internal Medicine | Admitting: Internal Medicine

## 2022-05-10 ENCOUNTER — Emergency Department (HOSPITAL_COMMUNITY): Payer: Medicare HMO

## 2022-05-10 ENCOUNTER — Encounter (HOSPITAL_COMMUNITY): Payer: Self-pay | Admitting: Internal Medicine

## 2022-05-10 DIAGNOSIS — Z8249 Family history of ischemic heart disease and other diseases of the circulatory system: Secondary | ICD-10-CM | POA: Diagnosis not present

## 2022-05-10 DIAGNOSIS — Z6837 Body mass index (BMI) 37.0-37.9, adult: Secondary | ICD-10-CM

## 2022-05-10 DIAGNOSIS — R0603 Acute respiratory distress: Secondary | ICD-10-CM | POA: Diagnosis present

## 2022-05-10 DIAGNOSIS — J449 Chronic obstructive pulmonary disease, unspecified: Secondary | ICD-10-CM | POA: Diagnosis present

## 2022-05-10 DIAGNOSIS — Z880 Allergy status to penicillin: Secondary | ICD-10-CM | POA: Diagnosis not present

## 2022-05-10 DIAGNOSIS — Z7951 Long term (current) use of inhaled steroids: Secondary | ICD-10-CM

## 2022-05-10 DIAGNOSIS — F17210 Nicotine dependence, cigarettes, uncomplicated: Secondary | ICD-10-CM | POA: Diagnosis present

## 2022-05-10 DIAGNOSIS — Z800 Family history of malignant neoplasm of digestive organs: Secondary | ICD-10-CM | POA: Diagnosis not present

## 2022-05-10 DIAGNOSIS — R062 Wheezing: Secondary | ICD-10-CM | POA: Diagnosis not present

## 2022-05-10 DIAGNOSIS — J189 Pneumonia, unspecified organism: Secondary | ICD-10-CM | POA: Diagnosis not present

## 2022-05-10 DIAGNOSIS — R0602 Shortness of breath: Secondary | ICD-10-CM | POA: Diagnosis not present

## 2022-05-10 DIAGNOSIS — J9601 Acute respiratory failure with hypoxia: Secondary | ICD-10-CM | POA: Diagnosis present

## 2022-05-10 DIAGNOSIS — Z79899 Other long term (current) drug therapy: Secondary | ICD-10-CM | POA: Diagnosis not present

## 2022-05-10 DIAGNOSIS — E669 Obesity, unspecified: Secondary | ICD-10-CM | POA: Diagnosis present

## 2022-05-10 DIAGNOSIS — Z885 Allergy status to narcotic agent status: Secondary | ICD-10-CM | POA: Diagnosis not present

## 2022-05-10 DIAGNOSIS — R000 Tachycardia, unspecified: Secondary | ICD-10-CM | POA: Diagnosis not present

## 2022-05-10 DIAGNOSIS — I5031 Acute diastolic (congestive) heart failure: Secondary | ICD-10-CM | POA: Diagnosis not present

## 2022-05-10 DIAGNOSIS — J441 Chronic obstructive pulmonary disease with (acute) exacerbation: Secondary | ICD-10-CM | POA: Diagnosis not present

## 2022-05-10 DIAGNOSIS — Z720 Tobacco use: Secondary | ICD-10-CM | POA: Diagnosis present

## 2022-05-10 DIAGNOSIS — R0902 Hypoxemia: Secondary | ICD-10-CM | POA: Diagnosis not present

## 2022-05-10 DIAGNOSIS — Z86010 Personal history of colonic polyps: Secondary | ICD-10-CM | POA: Diagnosis not present

## 2022-05-10 DIAGNOSIS — J181 Lobar pneumonia, unspecified organism: Secondary | ICD-10-CM | POA: Diagnosis present

## 2022-05-10 DIAGNOSIS — I10 Essential (primary) hypertension: Secondary | ICD-10-CM | POA: Diagnosis present

## 2022-05-10 LAB — CBC WITH DIFFERENTIAL/PLATELET
Abs Immature Granulocytes: 0.02 10*3/uL (ref 0.00–0.07)
Basophils Absolute: 0 10*3/uL (ref 0.0–0.1)
Basophils Relative: 1 %
Eosinophils Absolute: 0.1 10*3/uL (ref 0.0–0.5)
Eosinophils Relative: 1 %
HCT: 47.9 % (ref 39.0–52.0)
Hemoglobin: 16.1 g/dL (ref 13.0–17.0)
Immature Granulocytes: 0 %
Lymphocytes Relative: 28 %
Lymphs Abs: 2 10*3/uL (ref 0.7–4.0)
MCH: 32.9 pg (ref 26.0–34.0)
MCHC: 33.6 g/dL (ref 30.0–36.0)
MCV: 97.8 fL (ref 80.0–100.0)
Monocytes Absolute: 0.9 10*3/uL (ref 0.1–1.0)
Monocytes Relative: 13 %
Neutro Abs: 4.1 10*3/uL (ref 1.7–7.7)
Neutrophils Relative %: 57 %
Platelets: 168 10*3/uL (ref 150–400)
RBC: 4.9 MIL/uL (ref 4.22–5.81)
RDW: 14.5 % (ref 11.5–15.5)
WBC: 7.2 10*3/uL (ref 4.0–10.5)
nRBC: 0 % (ref 0.0–0.2)

## 2022-05-10 LAB — BASIC METABOLIC PANEL
Anion gap: 9 (ref 5–15)
BUN: 11 mg/dL (ref 8–23)
CO2: 27 mmol/L (ref 22–32)
Calcium: 8.4 mg/dL — ABNORMAL LOW (ref 8.9–10.3)
Chloride: 101 mmol/L (ref 98–111)
Creatinine, Ser: 0.92 mg/dL (ref 0.61–1.24)
GFR, Estimated: >60 mL/min (ref >60)
Glucose, Bld: 146 mg/dL — ABNORMAL HIGH (ref 70–99)
Potassium: 3.6 mmol/L (ref 3.5–5.1)
Sodium: 137 mmol/L (ref 135–145)

## 2022-05-10 LAB — CBC
HCT: 46.7 % (ref 39.0–52.0)
Hemoglobin: 15.2 g/dL (ref 13.0–17.0)
MCH: 31.9 pg (ref 26.0–34.0)
MCHC: 32.5 g/dL (ref 30.0–36.0)
MCV: 98.1 fL (ref 80.0–100.0)
Platelets: 152 10*3/uL (ref 150–400)
RBC: 4.76 MIL/uL (ref 4.22–5.81)
RDW: 14.5 % (ref 11.5–15.5)
WBC: 6.1 10*3/uL (ref 4.0–10.5)
nRBC: 0 % (ref 0.0–0.2)

## 2022-05-10 LAB — CREATININE, SERUM
Creatinine, Ser: 0.94 mg/dL (ref 0.61–1.24)
GFR, Estimated: >60 mL/min (ref >60)

## 2022-05-10 LAB — BLOOD GAS, VENOUS
Acid-Base Excess: 2.4 mmol/L — ABNORMAL HIGH (ref 0.0–2.0)
Bicarbonate: 30.2 mmol/L — ABNORMAL HIGH (ref 20.0–28.0)
O2 Saturation: 92.2 %
Patient temperature: 37
pCO2, Ven: 60 mmHg (ref 44–60)
pH, Ven: 7.31 (ref 7.25–7.43)
pO2, Ven: 59 mmHg — ABNORMAL HIGH (ref 32–45)

## 2022-05-10 LAB — TROPONIN I (HIGH SENSITIVITY): Troponin I (High Sensitivity): 6 ng/L (ref <18)

## 2022-05-10 LAB — HIV ANTIBODY (ROUTINE TESTING W REFLEX): HIV Screen 4th Generation wRfx: NONREACTIVE

## 2022-05-10 MED ORDER — CHLORTHALIDONE 25 MG PO TABS: 25.0000 mg | ORAL_TABLET | Freq: Every day | ORAL | Status: DC

## 2022-05-10 MED ORDER — ONDANSETRON HCL 4 MG PO TABS: 4.0000 mg | ORAL_TABLET | Freq: Four times a day (QID) | ORAL | Status: DC | PRN

## 2022-05-10 MED ORDER — ONDANSETRON HCL 4 MG/2ML IJ SOLN: 4.0000 mg | Freq: Four times a day (QID) | INTRAMUSCULAR | Status: DC | PRN

## 2022-05-10 MED ORDER — ONDANSETRON HCL 4 MG/2ML IJ SOLN
4.0000 mg | Freq: Once | INTRAMUSCULAR | Status: AC
  Administered 2022-05-10: 4 mg via INTRAVENOUS
  Filled 2022-05-10: qty 2

## 2022-05-10 MED ORDER — ATORVASTATIN CALCIUM 10 MG PO TABS
20.0000 mg | ORAL_TABLET | Freq: Every day | ORAL | Status: DC
  Administered 2022-05-10 – 2022-05-13 (×4): 20 mg via ORAL
  Filled 2022-05-10 (×5): qty 2

## 2022-05-10 MED ORDER — MAGNESIUM SULFATE 2 GM/50ML IV SOLN
2.0000 g | Freq: Once | INTRAVENOUS | Status: AC
  Administered 2022-05-10: 2 g via INTRAVENOUS
  Filled 2022-05-10: qty 50

## 2022-05-10 MED ORDER — SODIUM CHLORIDE 0.9 % IV SOLN
500.0000 mg | Freq: Once | INTRAVENOUS | Status: AC
  Administered 2022-05-10: 500 mg via INTRAVENOUS
  Filled 2022-05-10: qty 5

## 2022-05-10 MED ORDER — METHYLPREDNISOLONE SODIUM SUCC 125 MG IJ SOLR
81.2500 mg | Freq: Every day | INTRAMUSCULAR | Status: DC
  Administered 2022-05-10 – 2022-05-11 (×2): 81.25 mg via INTRAVENOUS
  Filled 2022-05-10 (×2): qty 2

## 2022-05-10 MED ORDER — METOPROLOL TARTRATE 5 MG/5ML IV SOLN: 5.0000 mg | Freq: Four times a day (QID) | INTRAVENOUS | Status: DC | PRN

## 2022-05-10 MED ORDER — IPRATROPIUM BROMIDE 0.02 % IN SOLN
0.5000 mg | Freq: Once | RESPIRATORY_TRACT | Status: AC
  Administered 2022-05-10: 0.5 mg via RESPIRATORY_TRACT
  Filled 2022-05-10: qty 2.5

## 2022-05-10 MED ORDER — ALBUTEROL SULFATE (2.5 MG/3ML) 0.083% IN NEBU
2.5000 mg | INHALATION_SOLUTION | RESPIRATORY_TRACT | Status: DC | PRN
  Administered 2022-05-11 – 2022-05-13 (×3): 2.5 mg via RESPIRATORY_TRACT
  Filled 2022-05-10 (×3): qty 3

## 2022-05-10 MED ORDER — ACETAMINOPHEN 325 MG PO TABS: 650.0000 mg | ORAL_TABLET | Freq: Four times a day (QID) | ORAL | Status: DC | PRN

## 2022-05-10 MED ORDER — IPRATROPIUM-ALBUTEROL 0.5-2.5 (3) MG/3ML IN SOLN
3.0000 mL | Freq: Four times a day (QID) | RESPIRATORY_TRACT | Status: DC
  Administered 2022-05-10 – 2022-05-13 (×12): 3 mL via RESPIRATORY_TRACT
  Filled 2022-05-10 (×12): qty 3

## 2022-05-10 MED ORDER — SODIUM CHLORIDE 0.9 % IV SOLN
2.0000 g | INTRAVENOUS | Status: DC
  Administered 2022-05-11 – 2022-05-12 (×2): 2 g via INTRAVENOUS
  Filled 2022-05-10 (×2): qty 20

## 2022-05-10 MED ORDER — ACETAMINOPHEN 650 MG RE SUPP: 650.0000 mg | Freq: Four times a day (QID) | RECTAL | Status: DC | PRN

## 2022-05-10 MED ORDER — ALBUTEROL SULFATE (2.5 MG/3ML) 0.083% IN NEBU
10.0000 mg/h | INHALATION_SOLUTION | Freq: Once | RESPIRATORY_TRACT | Status: AC
  Administered 2022-05-10: 10 mg/h via RESPIRATORY_TRACT
  Filled 2022-05-10: qty 12

## 2022-05-10 MED ORDER — SODIUM CHLORIDE 0.9 % IV SOLN
500.0000 mg | INTRAVENOUS | Status: DC
  Administered 2022-05-11 – 2022-05-12 (×2): 500 mg via INTRAVENOUS
  Filled 2022-05-10 (×2): qty 5

## 2022-05-10 MED ORDER — TRAZODONE HCL 50 MG PO TABS
25.0000 mg | ORAL_TABLET | Freq: Every evening | ORAL | Status: DC | PRN
  Administered 2022-05-11: 25 mg via ORAL
  Filled 2022-05-10: qty 1

## 2022-05-10 MED ORDER — SODIUM CHLORIDE 0.9 % IV SOLN
2.0000 g | Freq: Once | INTRAVENOUS | Status: AC
  Administered 2022-05-10: 2 g via INTRAVENOUS
  Filled 2022-05-10: qty 20

## 2022-05-10 MED ORDER — ENOXAPARIN SODIUM 60 MG/0.6ML IJ SOSY
60.0000 mg | PREFILLED_SYRINGE | Freq: Every day | INTRAMUSCULAR | Status: DC
  Administered 2022-05-10 – 2022-05-12 (×3): 60 mg via SUBCUTANEOUS
  Filled 2022-05-10 (×4): qty 0.6

## 2022-05-10 MED ORDER — IRBESARTAN 150 MG PO TABS: 150.0000 mg | ORAL_TABLET | Freq: Every day | ORAL | Status: DC

## 2022-05-10 NOTE — ED Triage Notes (Signed)
BIB [facility] from home c/o SOB. Symptoms started last night before bed. Attempted multiple neb tx at home without relief. EMS administered 1 duoneb and 1 albuterol tx. Received 125 mg IV solumedrol per EMS.  Pt does not want to sit on stretcher and is more comfortable in tripod position kneeling over side of stretcher.   EMS vitals: 87% room air, HR 115, BP 194/96 > 175/100

## 2022-05-10 NOTE — H&P (Addendum)
History and Physical  [name] NFA:[id number] DOB: 08/13/1955 DOA: 05/10/2022  PCP: Daniel Register, MD   Chief Complaint: cough, sob   HPI: [name] is a 67 y.o. male with medical history significant for hypertension and COPD not on chronic oxygen being admitted to the hospital with COPD and suspected community-acquired pneumonia.  The patient states that he has been feeling short of breath with a nonproductive cough for about the last 24 hours.  Shortness of breath is worse with exertion.  Denies fevers, nausea, vomiting, chest pain.  ED Course: On evaluation in the ER, he was 89% on room air and was placed on 2 L nasal cannula oxygen.  Lab work was relatively unremarkable, chest x-ray with possibility of bronchopneumonia.  He was given empiric IV azithromycin, IV Rocephin, IV steroids, breathing treatments, kept on supplemental oxygen and hospitalist was contacted for admission.  Currently the patient is resting comfortably, says that he feels better than when he arrived.  He is saturating 96% on 2 L nasal cannula oxygen, his fiance is at the bedside.  Review of Systems: Please see HPI for pertinent positives and negatives. A complete 10 system review of systems are otherwise negative.  Past Medical History:  Diagnosis Date   Asthma    COPD (chronic obstructive pulmonary disease) (HCC)    Hx of adenomatous polyp of colon 01/13/2020   Hypertension    Past Surgical History:  Procedure Laterality Date   HAND SURGERY Bilateral left [id number] Rt    Social History:  reports that he has been smoking cigarettes. He has a 45.00 pack-year smoking history. He has never used smokeless tobacco. He reports current alcohol use of about 1.0 standard drink of alcohol per week. He reports current drug use. Frequency: 1.00 time per week. Drug: Marijuana.   Allergies  Allergen Reactions   Codeine Itching   Penicillins Itching and Other (See Comments)    Has patient had a PCN  reaction causing immediate rash, facial/tongue/throat swelling, SOB or lightheadedness with hypotension: No Has patient had a PCN reaction causing severe rash involving mucus membranes or skin necrosis: No Has patient had a PCN reaction that required hospitalization No Has patient had a PCN reaction occurring within the last 10 years: No If all of the above answers are "NO", then may proceed with Cephalosporin use.    Family History  Problem Relation Age of Onset   Hypertension Paternal Uncle    Cancer Maternal Grandmother    Hypertension Maternal Grandfather    Colon cancer Cousin    Colon polyps Neg Hx    Esophageal cancer Neg Hx    Stomach cancer Neg Hx    Rectal cancer Neg Hx      Prior to Admission medications   Medication Sig Start Date End Date Taking? Authorizing Provider  albuterol (VENTOLIN HFA) 108 (90 Base) MCG/ACT inhaler Inhale 2 puffs into the lungs every 6 (six) hours as needed for wheezing or shortness of breath. 04/10/22   Daniel Register, MD  atorvastatin (LIPITOR) 20 MG tablet Take 1 tablet (20 mg total) by mouth daily. 07/23/21   Daniel Register, MD  Budeson-Glycopyrrol-Formoterol (BREZTRI AEROSPHERE) 160-9-4.8 MCG/ACT AERO Inhale 2 puffs into the lungs 2 (two) times daily. 10/16/20   Daniel Register, MD  buPROPion (WELLBUTRIN XL) 150 MG 24 hr tablet Take 1 tablet (150 mg total) by mouth daily. For smoking cessation 01/30/22   Daniel Register, MD  chlorthalidone (HYGROTON) 25 MG tablet Take 1 tablet (25 mg total) by  mouth daily. 01/30/22   Newlin, Odette Horns, MD  ipratropium-albuterol (DUONEB) 0.5-2.5 (3) MG/3ML SOLN TAKE 3 MLS BY NEBULIZATION EVERY 6 (SIX) HOURS AS NEEDED (SEVERE WHEEZING). 01/30/22   Daniel Register, MD  Misc. Devices MISC Nebulizer device.  Diagnosis COPD 01/30/22   Daniel Register, MD  valsartan (DIOVAN) 160 MG tablet Take 1 tablet (160 mg total) by mouth daily. 07/05/21   Parke Poisson, MD    Physical Exam: BP (!) 140/76   Pulse (!) 103   Temp  98.2 F (36.8 C) (Oral)   Resp (!) 24   Ht 5\' 9"  (1.753 m)   Wt 113.4 kg   SpO2 95%   BMI 36.92 kg/m   General:  Alert, oriented, calm, in no acute distress, sleeping on my arrival, wearing 2 L nasal cannula oxygen.  Easily arousable, pleasant and cooperative speaking in full sentences with Eyes: EOMI, clear conjuctivae, white sclerea Neck: supple, no masses, trachea mildline  Cardiovascular: RRR, no murmurs or rubs, no peripheral edema  Respiratory: clear to auscultation bilaterally, minimal end expiratory wheezing, no rhonchi, no basilar crackles, tachypnea, retractions, or other evidence of respiratory distress Abdomen: soft, nontender, nondistended, normal bowel tones heard  Skin: dry, no rashes  Musculoskeletal: no joint effusions, normal range of motion  Psychiatric: appropriate affect, normal speech  Neurologic: extraocular muscles intact, clear speech, moving all extremities with intact sensorium          Labs on Admission:  Basic Metabolic Panel: Recent Labs  Lab 05/10/22 0502  NA 137  K 3.6  CL 101  CO2 27  GLUCOSE 146*  BUN 11  CREATININE 0.92  CALCIUM 8.4*   Liver Function Tests: No results for input(s): "AST", "ALT", "ALKPHOS", "BILITOT", "PROT", "ALBUMIN" in the last 168 hours. No results for input(s): "LIPASE", "AMYLASE" in the last 168 hours. No results for input(s): "AMMONIA" in the last 168 hours. CBC: Recent Labs  Lab 05/10/22 0405  WBC 7.2  NEUTROABS 4.1  HGB 16.1  HCT 47.9  MCV 97.8  PLT 168   Cardiac Enzymes: No results for input(s): "CKTOTAL", "CKMB", "CKMBINDEX", "TROPONINI" in the last 168 hours.  BNP (last 3 results) No results for input(s): "BNP" in the last 8760 hours.  ProBNP (last 3 results) No results for input(s): "PROBNP" in the last 8760 hours.  CBG: No results for input(s): "GLUCAP" in the last 168 hours.  Radiological Exams on Admission: DG Chest Port 1 View  Result Date: 05/10/2022 CLINICAL DATA:  Shortness of  breath EXAM: PORTABLE CHEST 1 VIEW COMPARISON:  06/25/2021 FINDINGS: Hazy appearance of the lower left chest on the first view which is less pronounced on the second view. No edema, effusion, or pneumothorax. Normal heart size and mediastinal contours. IMPRESSION: Mild hazy density at the left base, bronchopneumonia or atelectasis could give this appearance. Electronically Signed   By: Tiburcio Pea M.D.   On: 05/10/2022 04:44    Assessment/Plan This is a pleasant 67 year old gentleman with a history of tobacco abuse, COPD on room air, hypertension admitted to the hospital with 24 hours of cough, shortness of breath suspected COPD exacerbation complicated by community-acquired pneumonia. -Observation admission to MedSurg -Supplemental oxygen, wean as tolerated; goal O2 sat 90% or above -Continue empiric IV azithromycin and IV Rocephin -Continue IV Solu-Medrol 40 mg every 12 -DuoNeb 4 times daily, with as needed albuterol every 2 hours   CAP (community acquired pneumonia) Active Problems:   Tobacco abuse   Obesity-BMI 37 complicating all aspects of care  Essential hypertension-not taking listed home meds, will hold for now as BP normal at the moment. PRN metoprolol and can resume home meds if BP remains elevated.   COPD mixed type (HCC)   DVT prophylaxis: Lovenox     Code Status: Full Code  Consults called: None  Admission status: Observation  Time spent: 48 minutes  Ellyson Rarick Sharlette Dense MD [facility] Pager [phone]  If 7PM-7AM, please contact night-coverage [email or web address] Password [employer]  05/10/2022, 8:12 AM

## 2022-05-10 NOTE — ED Provider Notes (Signed)
[place] EMERGENCY DEPARTMENT AT [facility] Provider Note   CSN: [id number] Arrival date & time: 05/10/22  0351     History  Chief Complaint  Patient presents with   Shortness of Breath    [name] is a 67 y.o. male.  67 yo M with a chief complaint of occultly breathing.  Going on for the past day.  Feels like his prior COPD exacerbations.  Tried his breathing treatment at home without improvement.  Denies sputum.  Has had some chest tightness with this.   Shortness of Breath      Home Medications Prior to Admission medications   Medication Sig Start Date End Date Taking? Authorizing Provider  albuterol (VENTOLIN HFA) 108 (90 Base) MCG/ACT inhaler Inhale 2 puffs into the lungs every 6 (six) hours as needed for wheezing or shortness of breath. 04/10/22   Hoy Register, MD  atorvastatin (LIPITOR) 20 MG tablet Take 1 tablet (20 mg total) by mouth daily. 07/23/21   Hoy Register, MD  Budeson-Glycopyrrol-Formoterol (BREZTRI AEROSPHERE) 160-9-4.8 MCG/ACT AERO Inhale 2 puffs into the lungs 2 (two) times daily. 10/16/20   Hoy Register, MD  buPROPion (WELLBUTRIN XL) 150 MG 24 hr tablet Take 1 tablet (150 mg total) by mouth daily. For smoking cessation 01/30/22   Hoy Register, MD  chlorthalidone (HYGROTON) 25 MG tablet Take 1 tablet (25 mg total) by mouth daily. 01/30/22   Newlin, Odette Horns, MD  ipratropium-albuterol (DUONEB) 0.5-2.5 (3) MG/3ML SOLN TAKE 3 MLS BY NEBULIZATION EVERY 6 (SIX) HOURS AS NEEDED (SEVERE WHEEZING). 01/30/22   Hoy Register, MD  Misc. Devices MISC Nebulizer device.  Diagnosis COPD 01/30/22   Hoy Register, MD  valsartan (DIOVAN) 160 MG tablet Take 1 tablet (160 mg total) by mouth daily. 07/05/21   Parke Poisson, MD      Allergies    Codeine and Penicillins    Review of Systems   Review of Systems  Respiratory:  Positive for shortness of breath.     Physical Exam Updated Vital Signs BP (!) 159/83   Pulse (!) 126   Ht 5'  9" (1.753 m)   Wt 113.4 kg   SpO2 (!) 89%   BMI 36.92 kg/m  Physical Exam Vitals and nursing note reviewed.  Constitutional:      Appearance: He is well-developed.  HENT:     Head: Normocephalic and atraumatic.  Eyes:     Pupils: Pupils are equal, round, and reactive to light.  Neck:     Vascular: No JVD.  Cardiovascular:     Rate and Rhythm: Normal rate and regular rhythm.     Heart sounds: No murmur heard.    No friction rub. No gallop.  Pulmonary:     Effort: No respiratory distress.     Breath sounds: No wheezing.     Comments: Diminished breath sounds in all fields.  Prolonged expiratory effort. Abdominal:     General: There is no distension.     Tenderness: There is no abdominal tenderness. There is no guarding or rebound.  Musculoskeletal:        General: Normal range of motion.     Cervical back: Normal range of motion and neck supple.  Skin:    Coloration: Skin is not pale.     Findings: No rash.  Neurological:     Mental Status: He is alert and oriented to person, place, and time.  Psychiatric:        Behavior: Behavior normal.  ED Results / Procedures / Treatments   Labs (all labs ordered are listed, but only abnormal results are displayed) Labs Reviewed  BLOOD GAS, VENOUS - Abnormal; Notable for the following components:      Result Value   pO2, Ven 59 (*)    Bicarbonate 30.2 (*)    Acid-Base Excess 2.4 (*)    All other components within normal limits  BASIC METABOLIC PANEL - Abnormal; Notable for the following components:   Glucose, Bld 146 (*)    Calcium 8.4 (*)    All other components within normal limits  CBC WITH DIFFERENTIAL/PLATELET  TROPONIN I (HIGH SENSITIVITY)    EKG EKG Interpretation  Date/Time:  Friday May 10 2022 04:29:14 EDT Ventricular Rate:  117 PR Interval:  118 QRS Duration: 89 QT Interval:  327 QTC Calculation: 457 R Axis:   79 Text Interpretation: Sinus tachycardia Baseline wander TECHNICALLY DIFFICULT Otherwise  no significant change Confirmed by Melene Plan [phone]) on 05/10/2022 4:38:14 AM  Radiology DG Chest Port 1 View  Result Date: 05/10/2022 CLINICAL DATA:  Shortness of breath EXAM: PORTABLE CHEST 1 VIEW COMPARISON:  06/25/2021 FINDINGS: Hazy appearance of the lower left chest on the first view which is less pronounced on the second view. No edema, effusion, or pneumothorax. Normal heart size and mediastinal contours. IMPRESSION: Mild hazy density at the left base, bronchopneumonia or atelectasis could give this appearance. Electronically Signed   By: Tiburcio Pea M.D.   On: 05/10/2022 04:44    Procedures .Critical Care  Performed by: Melene Plan, DO Authorized by: Melene Plan, DO   Critical care provider statement:    Critical care time (minutes):  35   Critical care time was exclusive of:  Separately billable procedures and treating other patients   Critical care was time spent personally by me on the following activities:  Development of treatment plan with patient or surrogate, discussions with consultants, evaluation of patient's response to treatment, examination of patient, ordering and review of laboratory studies, ordering and review of radiographic studies, ordering and performing treatments and interventions, pulse oximetry, re-evaluation of patient's condition and review of old charts   Care discussed with: admitting provider       Medications Ordered in ED Medications  cefTRIAXone (ROCEPHIN) 2 g in sodium chloride 0.9 % 100 mL IVPB (has no administration in time range)  azithromycin (ZITHROMAX) 500 mg in sodium chloride 0.9 % 250 mL IVPB (has no administration in time range)  albuterol (PROVENTIL) (2.5 MG/3ML) 0.083% nebulizer solution (10 mg/hr Nebulization Given 05/10/22 0411)  ipratropium (ATROVENT) nebulizer solution 0.5 mg (0.5 mg Nebulization Given 05/10/22 0411)  magnesium sulfate IVPB 2 g 50 mL (2 g Intravenous New Bag/Given 05/10/22 0411)  ondansetron (ZOFRAN) injection 4 mg  (4 mg Intravenous Given 05/10/22 0419)    ED Course/ Medical Decision Making/ A&P                             Medical Decision Making Amount and/or Complexity of Data Reviewed Labs: ordered. Radiology: ordered.  Risk Prescription drug management.   67 yo M with a chief complaint of difficulty breathing.  This is been going on the past couple days.  Patient appears to be having some significant difficulty breathing on initial exam.  Will place on a continuous albuterol treatment he already received a DuoNeb and albuterol treatment with EMS.  Was given Solu-Medrol.  Will give a dose of mag.  Blood work.  Chest x-ray.  I was notified by nursing that the patient was unable to do a continuous neb and so ordered to place him on BiPAP.  On my repeat assessment the patient had declined BiPAP and was doing the continuous neb without issue.  He has improved aeration on repeat assessment.  Chest x-ray concerning for possible focal infiltrate and on my independent interpretation.  Will start on antibiotics.  VBG without hypercarbia.  Troponin negative.  No significant electrolyte abnormality.  Will discuss with medicine for admission.  The patients results and plan were reviewed and discussed.   Any x-rays performed were independently reviewed by myself.   Differential diagnosis were considered with the presenting HPI.  Medications  cefTRIAXone (ROCEPHIN) 2 g in sodium chloride 0.9 % 100 mL IVPB (has no administration in time range)  azithromycin (ZITHROMAX) 500 mg in sodium chloride 0.9 % 250 mL IVPB (has no administration in time range)  albuterol (PROVENTIL) (2.5 MG/3ML) 0.083% nebulizer solution (10 mg/hr Nebulization Given 05/10/22 0411)  ipratropium (ATROVENT) nebulizer solution 0.5 mg (0.5 mg Nebulization Given 05/10/22 0411)  magnesium sulfate IVPB 2 g 50 mL (2 g Intravenous New Bag/Given 05/10/22 0411)  ondansetron (ZOFRAN) injection 4 mg (4 mg Intravenous Given 05/10/22 0419)    Vitals:    05/10/22 0356 05/10/22 0358 05/10/22 0416  BP:  (!) 159/83   Pulse:  (!) 118 (!) 126  SpO2:  100% (!) 89%  Weight: 113.4 kg    Height: 5\' 9"  (1.753 m)      Final diagnoses:  COPD exacerbation (HCC)    Admission/ observation were discussed with the admitting physician, patient and/or family and they are comfortable with the plan.          Final Clinical Impression(s) / ED Diagnoses Final diagnoses:  COPD exacerbation [facility])    Rx / DC Orders ED Discharge Orders     None         Melene Plan, DO 05/10/22 0540

## 2022-05-10 NOTE — ED Notes (Signed)
ED TO INPATIENT HANDOFF REPORT  ED Nurse Name and Phone #:  Barbaraann Barthel [phone]  S Name/Age/Gender [name] 67 y.o. male Room/Bed: WA15/WA15  Code Status   Code Status: Full Code  Home/SNF/Other Home Patient oriented to: self, place, time, and situation Is this baseline? Yes   Triage Complete: Triage complete  Chief Complaint CAP (community acquired pneumonia) [J18.9]  Triage Note BIB [facility] from home c/o SOB. Symptoms started last night before bed. Attempted multiple neb tx at home without relief. EMS administered 1 duoneb and 1 albuterol tx. Received 125 mg IV solumedrol per EMS.  Pt does not want to sit on stretcher and is more comfortable in tripod position kneeling over side of stretcher.   EMS vitals: 87% room air, HR 115, BP 194/96 > 175/100   Allergies Allergies  Allergen Reactions   Codeine Itching   Penicillins Itching and Other (See Comments)    Level of Care/Admitting Diagnosis ED Disposition     ED Disposition  Admit   Condition  --   Comment  Hospital Area: [facility] [[id number]]  Level of Care: Med-Surg [16]  May place patient in observation at [facility] or [facility] if equivalent level of care is available:: Yes  Covid Evaluation: Asymptomatic - no recent exposure (last 10 days) testing not required  Diagnosis: CAP (community acquired pneumonia) [454098]  Admitting Physician: Kirby Crigler, Parks Neptune [[id number]]  Attending Physician: Kirby Crigler, MIR [email or web address] [[id number]]          B Medical/Surgery History Past Medical History:  Diagnosis Date   Asthma    COPD (chronic obstructive pulmonary disease) (HCC)    Hx of adenomatous polyp of colon 01/13/2020   Hypertension    Past Surgical History:  Procedure Laterality Date   HAND SURGERY Bilateral left 0865,[id number] Rt     A IV Location/Drains/Wounds Patient Lines/Drains/Airways Status     Active Line/Drains/Airways     Name Placement date Placement time Site Days   Peripheral  IV 05/10/22 20 G Right Antecubital 05/10/22  0341  Antecubital  less than 1            Intake/Output Last 24 hours  Intake/Output Summary (Last 24 hours) at 05/10/2022 0845 Last data filed at 05/10/2022 0618 Gross per 24 hour  Intake 144.81 ml  Output --  Net 144.81 ml    Labs/Imaging Results for orders placed or performed during the hospital encounter of 05/10/22 (from the past 48 hour(s))  CBC with Differential     Status: None   Collection Time: 05/10/22  4:05 AM  Result Value Ref Range   WBC 7.2 4.0 - 10.5 K/uL   RBC 4.90 4.22 - 5.81 MIL/uL   Hemoglobin 16.1 13.0 - 17.0 g/dL   HCT 96.2 95.2 - 84.1 %   MCV 97.8 80.0 - 100.0 fL   MCH 32.9 26.0 - 34.0 pg   MCHC 33.6 30.0 - 36.0 g/dL   RDW 32.4 40.1 - 02.7 %   Platelets 168 150 - 400 K/uL   nRBC 0.0 0.0 - 0.2 %   Neutrophils Relative % 57 %   Neutro Abs 4.1 1.7 - 7.7 K/uL   Lymphocytes Relative 28 %   Lymphs Abs 2.0 0.7 - 4.0 K/uL   Monocytes Relative 13 %   Monocytes Absolute 0.9 0.1 - 1.0 K/uL   Eosinophils Relative 1 %   Eosinophils Absolute 0.1 0.0 - 0.5 K/uL   Basophils Relative 1 %   Basophils Absolute 0.0 0.0 -  0.1 K/uL   Immature Granulocytes 0 %   Abs Immature Granulocytes 0.02 0.00 - 0.07 K/uL    Comment: Performed at [facility], [address] [place], [place] [address]  Blood gas, venous     Status: Abnormal   Collection Time: 05/10/22  4:30 AM  Result Value Ref Range   pH, Ven 7.31 7.25 - 7.43   pCO2, Ven 60 44 - 60 mmHg   pO2, Ven 59 (H) 32 - 45 mmHg   Bicarbonate 30.2 (H) 20.0 - 28.0 mmol/L   Acid-Base Excess 2.4 (H) 0.0 - 2.0 mmol/L   O2 Saturation 92.2 %   Patient temperature 37.0     Comment: Performed at [facility], [address] [place], [place] [address]  Basic metabolic panel     Status: Abnormal   Collection Time: 05/10/22  5:02 AM  Result Value Ref Range   Sodium 137 135 - 145 mmol/L   Potassium 3.6 3.5 - 5.1 mmol/L   Chloride 101 98 - 111  mmol/L   CO2 27 22 - 32 mmol/L   Glucose, Bld 146 (H) 70 - 99 mg/dL    Comment: Glucose reference range applies only to samples taken after fasting for at least 8 hours.   BUN 11 8 - 23 mg/dL   Creatinine, Ser 0.98 0.61 - 1.24 mg/dL   Calcium 8.4 (L) 8.9 - 10.3 mg/dL   GFR, Estimated >11 >91 mL/min    Comment: (NOTE) Calculated using the CKD-EPI Creatinine Equation (2021)    Anion gap 9 5 - 15    Comment: Performed at [facility], [address], [place], [place] [address]  Troponin I (High Sensitivity)     Status: None   Collection Time: 05/10/22  5:02 AM  Result Value Ref Range   Troponin I (High Sensitivity) 6 <18 ng/L    Comment: (NOTE) Elevated high sensitivity troponin I (hsTnI) values and significant  changes across serial measurements may suggest ACS but many other  chronic and acute conditions are known to elevate hsTnI results.  Refer to the "Links" section for chest pain algorithms and additional  guidance. Performed at [facility], [address], [place], [place] [address]    DG Chest Port 1 View  Result Date: 05/10/2022 CLINICAL DATA:  Shortness of breath EXAM: PORTABLE CHEST 1 VIEW COMPARISON:  06/25/2021 FINDINGS: Hazy appearance of the lower left chest on the first view which is less pronounced on the second view. No edema, effusion, or pneumothorax. Normal heart size and mediastinal contours. IMPRESSION: Mild hazy density at the left base, bronchopneumonia or atelectasis could give this appearance. Electronically Signed   By: Tiburcio Pea M.D.   On: 05/10/2022 04:44    Pending Labs Unresulted Labs (From admission, onward)     Start     Ordered   05/17/22 0500  Creatinine, serum  (enoxaparin (LOVENOX)    CrCl >/= 30 ml/min)  Weekly,   R     Comments: while on enoxaparin therapy    05/10/22 0811   05/11/22 0500  Basic metabolic panel  Tomorrow morning,   R        05/10/22 0811   05/11/22 0500  CBC  Tomorrow morning,    R        05/10/22 0811   05/10/22 0810  HIV Antibody (routine testing w rflx)  (HIV Antibody (Routine testing w reflex) panel)  Once,   R  05/10/22 0811   05/10/22 0810  CBC  (enoxaparin (LOVENOX)    CrCl >/= 30 ml/min)  Once,   R       Comments: Baseline for enoxaparin therapy IF NOT ALREADY DRAWN.  Notify MD if PLT < 100 K.    05/10/22 0811   05/10/22 0810  Creatinine, serum  (enoxaparin (LOVENOX)    CrCl >/= 30 ml/min)  Once,   R       Comments: Baseline for enoxaparin therapy IF NOT ALREADY DRAWN.    05/10/22 0811            Vitals/Pain Today's Vitals   05/10/22 0615 05/10/22 0645 05/10/22 0800 05/10/22 0830  BP:   (!) 140/76   Pulse: (!) 105 (!) 107 (!) 103 91  Resp: 20 19 (!) 24 19  Temp:      TempSrc:      SpO2: 92% 91% 95% 95%  Weight:      Height:      PainSc:        Isolation Precautions No active isolations  Medications Medications  atorvastatin (LIPITOR) tablet 20 mg (has no administration in time range)  chlorthalidone (HYGROTON) tablet 25 mg (has no administration in time range)  irbesartan (AVAPRO) tablet 150 mg (has no administration in time range)  enoxaparin (LOVENOX) injection 40 mg (has no administration in time range)  acetaminophen (TYLENOL) tablet 650 mg (has no administration in time range)    Or  acetaminophen (TYLENOL) suppository 650 mg (has no administration in time range)  traZODone (DESYREL) tablet 25 mg (has no administration in time range)  ondansetron (ZOFRAN) tablet 4 mg (has no administration in time range)    Or  ondansetron (ZOFRAN) injection 4 mg (has no administration in time range)  albuterol (PROVENTIL) (2.5 MG/3ML) 0.083% nebulizer solution 2.5 mg (has no administration in time range)  metoprolol tartrate (LOPRESSOR) injection 5 mg (has no administration in time range)  methylPREDNISolone sodium succinate (SOLU-MEDROL) 125 mg/2 mL injection 81.25 mg (has no administration in time range)  ipratropium-albuterol (DUONEB)  0.5-2.5 (3) MG/3ML nebulizer solution 3 mL (has no administration in time range)  azithromycin (ZITHROMAX) 500 mg in sodium chloride 0.9 % 250 mL IVPB (has no administration in time range)  cefTRIAXone (ROCEPHIN) 2 g in sodium chloride 0.9 % 100 mL IVPB (has no administration in time range)  albuterol (PROVENTIL) (2.5 MG/3ML) 0.083% nebulizer solution (10 mg/hr Nebulization Given 05/10/22 0411)  ipratropium (ATROVENT) nebulizer solution 0.5 mg (0.5 mg Nebulization Given 05/10/22 0411)  magnesium sulfate IVPB 2 g 50 mL (0 g Intravenous Stopped 05/10/22 0426)  ondansetron (ZOFRAN) injection 4 mg (4 mg Intravenous Given 05/10/22 0419)  cefTRIAXone (ROCEPHIN) 2 g in sodium chloride 0.9 % 100 mL IVPB (0 g Intravenous Stopped 05/10/22 0618)  azithromycin (ZITHROMAX) 500 mg in sodium chloride 0.9 % 250 mL IVPB (0 mg Intravenous Stopped 05/10/22 0718)         R Recommendations: See Admitting Provider Note  Report given to:   Additional Notes:

## 2022-05-11 ENCOUNTER — Inpatient Hospital Stay (HOSPITAL_COMMUNITY): Payer: Medicare HMO

## 2022-05-11 DIAGNOSIS — I5031 Acute diastolic (congestive) heart failure: Secondary | ICD-10-CM

## 2022-05-11 DIAGNOSIS — F17210 Nicotine dependence, cigarettes, uncomplicated: Secondary | ICD-10-CM | POA: Diagnosis present

## 2022-05-11 DIAGNOSIS — Z79899 Other long term (current) drug therapy: Secondary | ICD-10-CM | POA: Diagnosis not present

## 2022-05-11 DIAGNOSIS — Z880 Allergy status to penicillin: Secondary | ICD-10-CM | POA: Diagnosis not present

## 2022-05-11 DIAGNOSIS — Z6837 Body mass index (BMI) 37.0-37.9, adult: Secondary | ICD-10-CM | POA: Diagnosis not present

## 2022-05-11 DIAGNOSIS — Z8249 Family history of ischemic heart disease and other diseases of the circulatory system: Secondary | ICD-10-CM | POA: Diagnosis not present

## 2022-05-11 DIAGNOSIS — J9601 Acute respiratory failure with hypoxia: Secondary | ICD-10-CM | POA: Diagnosis present

## 2022-05-11 DIAGNOSIS — Z7951 Long term (current) use of inhaled steroids: Secondary | ICD-10-CM | POA: Diagnosis not present

## 2022-05-11 DIAGNOSIS — Z885 Allergy status to narcotic agent status: Secondary | ICD-10-CM | POA: Diagnosis not present

## 2022-05-11 DIAGNOSIS — E669 Obesity, unspecified: Secondary | ICD-10-CM | POA: Diagnosis present

## 2022-05-11 DIAGNOSIS — I10 Essential (primary) hypertension: Secondary | ICD-10-CM | POA: Diagnosis present

## 2022-05-11 DIAGNOSIS — R0602 Shortness of breath: Secondary | ICD-10-CM | POA: Diagnosis present

## 2022-05-11 DIAGNOSIS — R0603 Acute respiratory distress: Secondary | ICD-10-CM | POA: Diagnosis present

## 2022-05-11 DIAGNOSIS — J441 Chronic obstructive pulmonary disease with (acute) exacerbation: Secondary | ICD-10-CM | POA: Diagnosis present

## 2022-05-11 DIAGNOSIS — Z86010 Personal history of colonic polyps: Secondary | ICD-10-CM | POA: Diagnosis not present

## 2022-05-11 DIAGNOSIS — J189 Pneumonia, unspecified organism: Secondary | ICD-10-CM | POA: Diagnosis not present

## 2022-05-11 DIAGNOSIS — Z800 Family history of malignant neoplasm of digestive organs: Secondary | ICD-10-CM | POA: Diagnosis not present

## 2022-05-11 LAB — CBC
HCT: 43.1 % (ref 39.0–52.0)
Hemoglobin: 14.3 g/dL (ref 13.0–17.0)
MCH: 33 pg (ref 26.0–34.0)
MCHC: 33.2 g/dL (ref 30.0–36.0)
MCV: 99.5 fL (ref 80.0–100.0)
Platelets: 154 10*3/uL (ref 150–400)
RBC: 4.33 MIL/uL (ref 4.22–5.81)
RDW: 14.3 % (ref 11.5–15.5)
WBC: 7 10*3/uL (ref 4.0–10.5)
nRBC: 0 % (ref 0.0–0.2)

## 2022-05-11 LAB — BASIC METABOLIC PANEL
Anion gap: 6 (ref 5–15)
BUN: 19 mg/dL (ref 8–23)
CO2: 29 mmol/L (ref 22–32)
Calcium: 8.6 mg/dL — ABNORMAL LOW (ref 8.9–10.3)
Chloride: 102 mmol/L (ref 98–111)
Creatinine, Ser: 0.88 mg/dL (ref 0.61–1.24)
GFR, Estimated: >60 mL/min (ref >60)
Glucose, Bld: 124 mg/dL — ABNORMAL HIGH (ref 70–99)
Potassium: 4.7 mmol/L (ref 3.5–5.1)
Sodium: 137 mmol/L (ref 135–145)

## 2022-05-11 LAB — ECHOCARDIOGRAM COMPLETE
AR max vel: 2 cm2
AV Area VTI: 2.04 cm2
AV Area mean vel: 2.06 cm2
AV Mean grad: 6 mmHg
AV Peak grad: 11.1 mmHg
Ao pk vel: 1.67 m/s
Area-P 1/2: 3.85 cm2
Calc EF: 52.7 %
Height: 69 in
MV VTI: 2.43 cm2
S' Lateral: 3.6 cm
Single Plane A2C EF: 53.4 %
Single Plane A4C EF: 53.9 %
Weight: 4000 oz

## 2022-05-11 LAB — PROCALCITONIN: Procalcitonin: <0.1 ng/mL

## 2022-05-11 LAB — BRAIN NATRIURETIC PEPTIDE: B Natriuretic Peptide: 65.6 pg/mL (ref 0.0–100.0)

## 2022-05-11 MED ORDER — FUROSEMIDE 10 MG/ML IJ SOLN
40.0000 mg | Freq: Two times a day (BID) | INTRAMUSCULAR | Status: AC
  Administered 2022-05-11 (×2): 40 mg via INTRAVENOUS
  Filled 2022-05-11 (×2): qty 4

## 2022-05-11 MED ORDER — BUDESONIDE 0.25 MG/2ML IN SUSP
0.2500 mg | Freq: Two times a day (BID) | RESPIRATORY_TRACT | Status: DC
  Administered 2022-05-11 – 2022-05-13 (×5): 0.25 mg via RESPIRATORY_TRACT
  Filled 2022-05-11 (×5): qty 2

## 2022-05-11 MED ORDER — METHYLPREDNISOLONE SODIUM SUCC 40 MG IJ SOLR
40.0000 mg | Freq: Three times a day (TID) | INTRAMUSCULAR | Status: DC
  Administered 2022-05-11 – 2022-05-12 (×3): 40 mg via INTRAVENOUS
  Filled 2022-05-11 (×5): qty 1

## 2022-05-11 MED ORDER — MONTELUKAST SODIUM 10 MG PO TABS
10.0000 mg | ORAL_TABLET | Freq: Every day | ORAL | Status: DC
  Administered 2022-05-11 – 2022-05-12 (×2): 10 mg via ORAL
  Filled 2022-05-11 (×2): qty 1

## 2022-05-11 NOTE — Progress Notes (Signed)
PROGRESS NOTE    [name]  ZOX:[id number] DOB: 1955-07-15 DOA: 05/10/2022 PCP: Hoy Register, MD  Outpatient Specialists:     Brief Narrative:  Patient is a 67 year old African-American male who has smoked at least 1 pack daily for over 50 years, has COPD but not on oxygen, hypertension and asthma.  Patient presents with significant shortness of breath, dyspnea on exertion and orthopnea.  According to patient, he sleeps almost upright.  Patient continues to smoke cigarettes at home.  No associated fever or productive cough.  No leukocytosis.  Mild leg edema is still pending.  Patient is currently on IV Solu-Medrol and nebs DuoNeb.  05/11/2022: Admitted with acute respiratory failure/shortness of breath.  Chest x-ray is reported to be concerning for possible pneumonia.  Will check procalcitonin.  Patient has history of asthma.  Patient has history of COPD and continues to smoke cigarettes.  Counseled patient to quit cigarette use.  Check peak flow daily for 3 days.  Change IV Solu-Medrol to 40 Mg every 8 hourly.  And nebs Pulmicort and Singulair.  Gentle diuresis.  Get an echocardiogram.  Patient will need screening CT chest and abdominal ultrasound (for AAA) on discharge.   Assessment & Plan:   Principal Problem:   CAP (community acquired pneumonia) Active Problems:   Tobacco abuse   Obesity   Essential hypertension   COPD mixed type (HCC)   Acute respiratory distress   Acute hypoxic respiratory failure: -Likely secondary to COPD exacerbation. -Rule out acute on chronic CHF. -Rule out asthma exacerbation. -Peak flow daily. -Continue IV Solu-Medrol but adjust dose on frequency, nebs Pulmicort, Singulair and nebs DuoNeb. -Check cardiac BNP and echocardiogram. -Gentle diuresis. -Further management will depend on hospital course.  COPD with exacerbation: -See above documentation. -Continue IV Solu-Medrol and neb treatment.  Possible asthma exacerbation: Peak flow  daily. Singulair. IV Solu-Medrol and DuoNeb.  Possible pneumonia: -Check procalcitonin. -Repeat chest x-ray.  Tobacco abuse, continuous: -Counseled.  Moderate obesity: -Diet and exercise.  DVT prophylaxis: Subcutaneous Lovenox. Code Status: Full code Family Communication:  Disposition Plan: Likely home eventually.   Consultants:  None.  Procedures:  None.  Antimicrobials:  None   Subjective: Numbness of breath with activity.  Objective: Vitals:   05/10/22 1752 05/10/22 2148 05/11/22 0424 05/11/22 0807  BP: 124/75 127/61 (!) 145/88   Pulse: 89 (!) 103 83   Resp: 18 20 20    Temp: 98 F (36.7 C) 98.1 F (36.7 C) 97.7 F (36.5 C)   TempSrc: Oral Oral Oral   SpO2: 99% 94% 100% (!) 88%  Weight:      Height:        Intake/Output Summary (Last 24 hours) at 05/11/2022 1129 Last data filed at 05/11/2022 1000 Gross per 24 hour  Intake 856.03 ml  Output --  Net 856.03 ml   Filed Weights   05/10/22 0356  Weight: 113.4 kg    Examination:  General exam: Appears calm and comfortable.  Patient is obese. Respiratory system: Expiratory wheeze.   Cardiovascular system: S1 & S2 heard.   Gastrointestinal system: Abdomen is obese, soft and nontender. No organomegaly or masses felt. Normal bowel sounds heard. Central nervous system: Alert and oriented. No focal neurological deficits. Extremities: Mild bilateral ankle edema.  Data Reviewed: I have personally reviewed following labs and imaging studies  CBC: Recent Labs  Lab 05/10/22 0405 05/10/22 0909 05/11/22 0614  WBC 7.2 6.1 7.0  NEUTROABS 4.1  --   --   HGB 16.1 15.2 14.3  HCT 47.9 46.7 43.1  MCV 97.8 98.1 99.5  PLT 168 152 154   Basic Metabolic Panel: Recent Labs  Lab 05/10/22 0502 05/10/22 0909 05/11/22 0614  NA 137  --  137  K 3.6  --  4.7  CL 101  --  102  CO2 27  --  29  GLUCOSE 146*  --  124*  BUN 11  --  19  CREATININE 0.92 0.94 0.88  CALCIUM 8.4*  --  8.6*   GFR: Estimated  Creatinine Clearance: 103.9 mL/min (by C-G formula based on SCr of 0.88 mg/dL). Liver Function Tests: No results for input(s): "AST", "ALT", "ALKPHOS", "BILITOT", "PROT", "ALBUMIN" in the last 168 hours. No results for input(s): "LIPASE", "AMYLASE" in the last 168 hours. No results for input(s): "AMMONIA" in the last 168 hours. Coagulation Profile: No results for input(s): "INR", "PROTIME" in the last 168 hours. Cardiac Enzymes: No results for input(s): "CKTOTAL", "CKMB", "CKMBINDEX", "TROPONINI" in the last 168 hours. BNP (last 3 results) No results for input(s): "PROBNP" in the last 8760 hours. HbA1C: No results for input(s): "HGBA1C" in the last 72 hours. CBG: No results for input(s): "GLUCAP" in the last 168 hours. Lipid Profile: No results for input(s): "CHOL", "HDL", "LDLCALC", "TRIG", "CHOLHDL", "LDLDIRECT" in the last 72 hours. Thyroid Function Tests: No results for input(s): "TSH", "T4TOTAL", "FREET4", "T3FREE", "THYROIDAB" in the last 72 hours. Anemia Panel: No results for input(s): "VITAMINB12", "FOLATE", "FERRITIN", "TIBC", "IRON", "RETICCTPCT" in the last 72 hours. Urine analysis:    Component Value Date/Time   COLORURINE YELLOW 06/11/2017 1927   APPEARANCEUR CLEAR 06/11/2017 1927   LABSPEC 1.014 06/11/2017 1927   PHURINE 5.0 06/11/2017 1927   GLUCOSEU NEGATIVE 06/11/2017 1927   HGBUR MODERATE (A) 06/11/2017 1927   BILIRUBINUR NEGATIVE 06/11/2017 1927   KETONESUR NEGATIVE 06/11/2017 1927   PROTEINUR NEGATIVE 06/11/2017 1927   UROBILINOGEN 1.0 05/28/2014 1848   NITRITE NEGATIVE 06/11/2017 1927   LEUKOCYTESUR NEGATIVE 06/11/2017 1927   Sepsis Labs: @LABRCNTIP (procalcitonin:4,lacticidven:4)  )No results found for this or any previous visit (from the past 240 hour(s)).       Radiology Studies: DG Chest Port 1 View  Result Date: 05/10/2022 CLINICAL DATA:  Shortness of breath EXAM: PORTABLE CHEST 1 VIEW COMPARISON:  06/25/2021 FINDINGS: Hazy appearance of the  lower left chest on the first view which is less pronounced on the second view. No edema, effusion, or pneumothorax. Normal heart size and mediastinal contours. IMPRESSION: Mild hazy density at the left base, bronchopneumonia or atelectasis could give this appearance. Electronically Signed   By: Tiburcio Pea M.D.   On: 05/10/2022 04:44        Scheduled Meds:  atorvastatin  20 mg Oral Daily   budesonide (PULMICORT) nebulizer solution  0.25 mg Nebulization BID   enoxaparin (LOVENOX) injection  60 mg Subcutaneous Daily   furosemide  40 mg Intravenous Q12H   ipratropium-albuterol  3 mL Nebulization QID   methylPREDNISolone (SOLU-MEDROL) injection  81.25 mg Intravenous Daily   Continuous Infusions:  azithromycin 250 mL/hr at 05/11/22 1000   cefTRIAXone (ROCEPHIN)  IV Stopped (05/11/22 0650)     LOS: 0 days    Time spent: 55 minutes.    Berton Mount, MD  [facility] Pager #: [phone] 7PM-7AM contact night coverage as above

## 2022-05-11 NOTE — TOC CM/SW Note (Signed)
Transition of Care [facility]) Screening Note  Patient Details  Name: [name] Date of Birth: 19-Aug-1955  Transition of Care [facility]) CM/SW Contact:    Ewing Schlein, LCSW Phone Number: 05/11/2022, 1:55 PM  Transition of Care Department [facility]) has reviewed patient and no TOC needs have been identified at this time. We will continue to monitor patient advancement through interdisciplinary progression rounds. If new patient transition needs arise, please place a TOC consult.

## 2022-05-12 DIAGNOSIS — J441 Chronic obstructive pulmonary disease with (acute) exacerbation: Principal | ICD-10-CM

## 2022-05-12 LAB — RENAL FUNCTION PANEL
Albumin: 3.5 g/dL (ref 3.5–5.0)
Anion gap: 13 (ref 5–15)
BUN: 16 mg/dL (ref 8–23)
CO2: 28 mmol/L (ref 22–32)
Calcium: 8.8 mg/dL — ABNORMAL LOW (ref 8.9–10.3)
Chloride: 96 mmol/L — ABNORMAL LOW (ref 98–111)
Creatinine, Ser: 0.9 mg/dL (ref 0.61–1.24)
GFR, Estimated: >60 mL/min (ref >60)
Glucose, Bld: 134 mg/dL — ABNORMAL HIGH (ref 70–99)
Phosphorus: 3.3 mg/dL (ref 2.5–4.6)
Potassium: 3.9 mmol/L (ref 3.5–5.1)
Sodium: 137 mmol/L (ref 135–145)

## 2022-05-12 LAB — MAGNESIUM: Magnesium: 2.2 mg/dL (ref 1.7–2.4)

## 2022-05-12 MED ORDER — PREDNISONE 50 MG PO TABS
60.0000 mg | ORAL_TABLET | Freq: Two times a day (BID) | ORAL | Status: DC
  Administered 2022-05-12 – 2022-05-13 (×2): 60 mg via ORAL
  Filled 2022-05-12 (×2): qty 1

## 2022-05-12 MED ORDER — DOXYCYCLINE HYCLATE 100 MG PO TABS
100.0000 mg | ORAL_TABLET | Freq: Two times a day (BID) | ORAL | Status: DC
  Administered 2022-05-12 – 2022-05-13 (×2): 100 mg via ORAL
  Filled 2022-05-12 (×2): qty 1

## 2022-05-12 NOTE — Progress Notes (Signed)
PROGRESS NOTE    [name]  ZOX:[id number] DOB: [birth year] DOA: 05/10/2022 PCP: Hoy Register, MD   Brief Narrative:  Patient is a 67 year old African-American male who has smoked at least 1 pack daily for over 50 years, has COPD but not on oxygen, hypertension and asthma.  Patient presented with significant shortness of breath, dyspnea on exertion and orthopnea.  Admitted with acute respiratory failure/shortness of breath.  Chest x-ray is reported to be concerning for possible pneumonia.    Assessment & Plan:   Acute hypoxic respiratory failure Secondary to COPD exacerbation.  Weaned off of oxygen.  Continue treatment for COPD.  COPD with acute exacerbation: Seems to be improving.  Procalcitonin was unremarkable.  Changed to oral antibiotics.   Uses respiratory at home.  Has nebulizer medicines but does not have a machine.  Will request TOC to provide this to him at discharge. Needs a nebulizer machine at home for his COPD. He was counseled regarding his tobacco abuse. Initially chest x-ray raised concern for pneumonia.  Subsequent x-rays did not suggest the same.  Procalcitonin unremarkable.  Pneumonia ruled out. He does appear to get lung cancer screening in the outpatient setting.   Tobacco abuse, continuous: -Counseled.  Obesity: Estimated body mass index is 36.92 kg/m as calculated from the following:   Height as of this encounter: 5\' 9"  (1.753 m).   Weight as of this encounter: 113.4 kg.   DVT prophylaxis: Subcutaneous Lovenox. Code Status: Full code Family Communication: No family at bedside Disposition Plan: Likely home eventually.   Consultants:  None.  Procedures:  None.  Antimicrobials:  None   Subjective: Overall he feels better.  Still having some shortness of breath.  Able to ambulate.  Objective: Vitals:   05/11/22 1249 05/11/22 1914 05/12/22 0506 05/12/22 0729  BP: (!) 151/80 (!) 158/75 121/63   Pulse: (!) 104 (!) 101 79   Resp:  20 20 20    Temp: 98.8 F (37.1 C) 98.3 F (36.8 C) 98 F (36.7 C)   TempSrc: Oral Oral    SpO2: 93% 91% 92% 91%  Weight:      Height:        Intake/Output Summary (Last 24 hours) at 05/12/2022 0933 Last data filed at 05/11/2022 1500 Gross per 24 hour  Intake 676.03 ml  Output --  Net 676.03 ml    Filed Weights   05/10/22 0356  Weight: 113.4 kg    Examination:  General appearance: Awake alert.  In no distress Resp: Scattered Wheezing bilaterally.  No rhonchi.  No definite crackles.  Normal effort at rest. Cardio: S1-S2 is normal regular.  No S3-S4.  No rubs murmurs or bruit GI: Abdomen is soft.  Nontender nondistended.  Bowel sounds are present normal.  No masses organomegaly Extremities: No edema.  Full range of motion of lower extremities. Neurologic: Alert and oriented x3.  No focal neurological deficits.     Data Reviewed: I have personally reviewed following labs and imaging studies  CBC: Recent Labs  Lab 05/10/22 0405 05/10/22 0909 05/11/22 0614  WBC 7.2 6.1 7.0  NEUTROABS 4.1  --   --   HGB 16.1 15.2 14.3  HCT 47.9 46.7 43.1  MCV 97.8 98.1 99.5  PLT 168 152 154    Basic Metabolic Panel: Recent Labs  Lab 05/10/22 0502 05/10/22 0909 05/11/22 0614 05/12/22 0537  NA 137  --  137 137  K 3.6  --  4.7 3.9  CL 101  --  102 96*  CO2 27  --  29 28  GLUCOSE 146*  --  124* 134*  BUN 11  --  19 16  CREATININE 0.92 0.94 0.88 0.90  CALCIUM 8.4*  --  8.6* 8.8*  MG  --   --   --  2.2  PHOS  --   --   --  3.3    GFR: Estimated Creatinine Clearance: 101.6 mL/min (by C-G formula based on SCr of 0.9 mg/dL).  Liver Function Tests: Recent Labs  Lab 05/12/22 0537  ALBUMIN 3.5    Radiology Studies: ECHOCARDIOGRAM COMPLETE  Result Date: 05/11/2022    ECHOCARDIOGRAM REPORT   Patient Name:   SHAHEEN MENDE Date of Exam: 05/11/2022 Medical Rec #:  [id number]           Height:       69.0 in Accession #:    [id number]          Weight:       250.0 lb Date of  Birth:  01/18/1955          BSA:          2.272 m Patient Age:    66 years            BP:           151/80 mmHg Patient Gender: M                   HR:           53 bpm. Exam Location:  Inpatient Procedure: 2D Echo, Cardiac Doppler and Color Doppler Indications:    CHF - Acute Diastolic  History:        Patient has no prior history of Echocardiogram examinations.                 COPD; Risk Factors:Hypertension and Diabetes.  Sonographer:    Wallie Char Referring Phys: [id number] SYLVESTER I OGBATA  Sonographer Comments: Technically difficult study due to poor echo windows. Image acquisition challenging due to COPD. IMPRESSIONS  1. Left ventricular ejection fraction, by estimation, is 60 to 65%. The left ventricle has normal function. The left ventricle has no regional wall motion abnormalities. Left ventricular diastolic parameters are consistent with Grade I diastolic dysfunction (impaired relaxation).  2. Right ventricular systolic function is normal. The right ventricular size is normal. There is mildly elevated pulmonary artery systolic pressure.  3. The mitral valve is normal in structure. No evidence of mitral valve regurgitation. No evidence of mitral stenosis.  4. The aortic valve has an indeterminant number of cusps. Aortic valve regurgitation is not visualized. No aortic stenosis is present.  5. The inferior vena cava is dilated in size with <50% respiratory variability, suggesting right atrial pressure of 15 mmHg. FINDINGS  Left Ventricle: Left ventricular ejection fraction, by estimation, is 60 to 65%. The left ventricle has normal function. The left ventricle has no regional wall motion abnormalities. The left ventricular internal cavity size was normal in size. There is  no left ventricular hypertrophy. Left ventricular diastolic parameters are consistent with Grade I diastolic dysfunction (impaired relaxation). Normal left ventricular filling pressure. Right Ventricle: The right ventricular size is  normal. Right vetricular wall thickness was not well visualized. Right ventricular systolic function is normal. There is mildly elevated pulmonary artery systolic pressure. The tricuspid regurgitant velocity  is 2.43 m/s, and with an assumed right atrial pressure of 15 mmHg, the estimated right ventricular systolic pressure is 38.6 mmHg. Left Atrium: Left  atrial size was normal in size. Right Atrium: Right atrial size was normal in size. Pericardium: There is no evidence of pericardial effusion. Mitral Valve: The mitral valve is normal in structure. No evidence of mitral valve regurgitation. No evidence of mitral valve stenosis. MV peak gradient, 4.6 mmHg. The mean mitral valve gradient is 2.0 mmHg. Tricuspid Valve: The tricuspid valve is normal in structure. Tricuspid valve regurgitation is not demonstrated. No evidence of tricuspid stenosis. Aortic Valve: The aortic valve has an indeterminant number of cusps. Aortic valve regurgitation is not visualized. No aortic stenosis is present. Aortic valve mean gradient measures 6.0 mmHg. Aortic valve peak gradient measures 11.1 mmHg. Aortic valve area, by VTI measures 2.04 cm. Pulmonic Valve: The pulmonic valve was not well visualized. Pulmonic valve regurgitation is not visualized. No evidence of pulmonic stenosis. Aorta: The aortic root is normal in size and structure. Venous: The inferior vena cava is dilated in size with less than 50% respiratory variability, suggesting right atrial pressure of 15 mmHg. IAS/Shunts: No atrial level shunt detected by color flow Doppler.  LEFT VENTRICLE PLAX 2D LVIDd:         5.00 cm      Diastology LVIDs:         3.60 cm      LV e' medial:    9.97 cm/s LV PW:         0.80 cm      LV E/e' medial:  9.0 LV IVS:        0.90 cm      LV e' lateral:   12.10 cm/s LVOT diam:     1.90 cm      LV E/e' lateral: 7.4 LV SV:         63 LV SV Index:   28 LVOT Area:     2.84 cm  LV Volumes (MOD) LV vol d, MOD A2C: 69.3 ml LV vol d, MOD A4C: 108.0 ml  LV vol s, MOD A2C: 32.3 ml LV vol s, MOD A4C: 49.8 ml LV SV MOD A2C:     37.0 ml LV SV MOD A4C:     108.0 ml LV SV MOD BP:      46.4 ml RIGHT VENTRICLE             IVC RV Basal diam:  4.20 cm     IVC diam: 2.50 cm RV S prime:     16.10 cm/s TAPSE (M-mode): 3.1 cm LEFT ATRIUM             Index        RIGHT ATRIUM           Index LA diam:        4.20 cm 1.85 cm/m   RA Area:     17.10 cm LA Vol (A2C):   43.8 ml 19.28 ml/m  RA Volume:   46.20 ml  20.34 ml/m LA Vol (A4C):   42.2 ml 18.58 ml/m LA Biplane Vol: 44.6 ml 19.63 ml/m  AORTIC VALVE AV Area (Vmax):    2.00 cm AV Area (Vmean):   2.06 cm AV Area (VTI):     2.04 cm AV Vmax:           166.50 cm/s AV Vmean:          116.000 cm/s AV VTI:            0.310 m AV Peak Grad:      11.1 mmHg AV Mean Grad:  6.0 mmHg LVOT Vmax:         117.67 cm/s LVOT Vmean:        84.267 cm/s LVOT VTI:          0.223 m LVOT/AV VTI ratio: 0.72  AORTA Ao Root diam: 3.60 cm MITRAL VALVE                TRICUSPID VALVE MV Area (PHT): 3.85 cm     TR Peak grad:   23.6 mmHg MV Area VTI:   2.43 cm     TR Vmax:        243.00 cm/s MV Peak grad:  4.6 mmHg MV Mean grad:  2.0 mmHg     SHUNTS MV Vmax:       1.07 m/s     Systemic VTI:  0.22 m MV Vmean:      72.4 cm/s    Systemic Diam: 1.90 cm MV Decel Time: 197 msec MV E velocity: 90.00 cm/s MV A velocity: 101.00 cm/s MV E/A ratio:  0.89 Dina Rich MD Electronically signed by Dina Rich MD Signature Date/Time: 05/11/2022/2:09:01 PM    Final    DG CHEST PORT 1 VIEW  Result Date: 05/11/2022 CLINICAL DATA:  Pneumonia.  Rule out CHF. EXAM: PORTABLE CHEST 1 VIEW COMPARISON:  May 10, 2022 chest x-ray FINDINGS: Opacity in left base has resolved. The heart, hila, mediastinum, lungs, and pleura are normal. No pneumonia. No overt edema. IMPRESSION: No active disease. Electronically Signed   By: Gerome Sam III M.D.   On: 05/11/2022 12:00     Scheduled Meds:  atorvastatin  20 mg Oral Daily   budesonide (PULMICORT) nebulizer  solution  0.25 mg Nebulization BID   doxycycline  100 mg Oral Q12H   enoxaparin (LOVENOX) injection  60 mg Subcutaneous Daily   ipratropium-albuterol  3 mL Nebulization QID   montelukast  10 mg Oral QHS   predniSONE  60 mg Oral BID WC   Continuous Infusions:     LOS: 1 day    [employer] [facility]

## 2022-05-13 ENCOUNTER — Telehealth: Payer: Self-pay | Admitting: Family Medicine

## 2022-05-13 ENCOUNTER — Other Ambulatory Visit: Payer: Self-pay

## 2022-05-13 DIAGNOSIS — J441 Chronic obstructive pulmonary disease with (acute) exacerbation: Secondary | ICD-10-CM | POA: Diagnosis not present

## 2022-05-13 MED ORDER — DOXYCYCLINE HYCLATE 100 MG PO TABS
100.0000 mg | ORAL_TABLET | Freq: Two times a day (BID) | ORAL | 0 refills | Status: AC
  Filled 2022-05-13: qty 8, 4d supply, fill #0

## 2022-05-13 MED ORDER — PREDNISONE 20 MG PO TABS
ORAL_TABLET | ORAL | 0 refills | Status: DC
  Filled 2022-05-13: qty 18, 9d supply, fill #0

## 2022-05-13 MED ORDER — ALBUTEROL SULFATE HFA 108 (90 BASE) MCG/ACT IN AERS
2.0000 | INHALATION_SPRAY | Freq: Four times a day (QID) | RESPIRATORY_TRACT | 1 refills | Status: DC | PRN
  Filled 2022-05-13: qty 18, 25d supply, fill #0
  Filled 2022-05-13: qty 54, 75d supply, fill #0
  Filled 2022-05-21: qty 18, 25d supply, fill #1

## 2022-05-13 NOTE — Discharge Summary (Signed)
[facility]  Physician Discharge Summary   Patient ID: [name] MRN: [id number] DOB/AGE: 05/26/1955 67 y.o.  Admit date: 05/10/2022 Discharge date: 05/13/2022    PCP: Hoy Register, MD  DISCHARGE DIAGNOSES:  COPD with acute exacerbation   Tobacco abuse   Obesity   Essential hypertension   COPD mixed type (HCC)   RECOMMENDATIONS FOR OUTPATIENT FOLLOW UP: Patient instructed to follow-up with primary care provider   Home Health: None Equipment/Devices: None  CODE STATUS: Full code  DISCHARGE CONDITION: fair  Diet recommendation: As before  INITIAL HISTORY: 67 year old African-American male who has smoked at least 1 pack daily for over 50 years, has COPD but not on oxygen, hypertension and asthma.  Patient presented with significant shortness of breath, dyspnea on exertion and orthopnea.  Admitted with acute respiratory failure/shortness of breath.  Chest x-ray is reported to be concerning for possible pneumonia.     HOSPITAL COURSE:   Acute hypoxic respiratory failure, resolved Secondary to COPD exacerbation.  Weaned off of oxygen.     COPD with acute exacerbation: Treated with steroids nebulizer treatment needs and antibiotics.  Improvement noted.  Weaned off of oxygen.   Needs a nebulizer machine at home for his COPD. He was counseled regarding his tobacco abuse. Initially chest x-ray raised concern for pneumonia.  Subsequent x-rays did not suggest the same.  Procalcitonin unremarkable.  Pneumonia ruled out. He does appear to get lung cancer screening in the outpatient setting.    Tobacco abuse, continuous: -Counseled.   Obesity: Estimated body mass index is 36.92 kg/m as calculated from the following:   Height as of this encounter: 5\' 9"  (1.753 m).   Weight as of this encounter: 113.4 kg.  Patient is stable.  Okay for discharge home today.   PERTINENT LABS:  The results of significant diagnostics from this hospitalization (including  imaging, microbiology, ancillary and laboratory) are listed below for reference.      Labs:   Basic Metabolic Panel: Recent Labs  Lab 05/10/22 0502 05/10/22 0909 05/11/22 0614 05/12/22 0537  NA 137  --  137 137  K 3.6  --  4.7 3.9  CL 101  --  102 96*  CO2 27  --  29 28  GLUCOSE 146*  --  124* 134*  BUN 11  --  19 16  CREATININE 0.92 0.94 0.88 0.90  CALCIUM 8.4*  --  8.6* 8.8*  MG  --   --   --  2.2  PHOS  --   --   --  3.3   Liver Function Tests: Recent Labs  Lab 05/12/22 0537  ALBUMIN 3.5    CBC: Recent Labs  Lab 05/10/22 0405 05/10/22 0909 05/11/22 0614  WBC 7.2 6.1 7.0  NEUTROABS 4.1  --   --   HGB 16.1 15.2 14.3  HCT 47.9 46.7 43.1  MCV 97.8 98.1 99.5  PLT 168 152 154   BNP: BNP (last 3 results) Recent Labs    05/11/22 1133  BNP 65.6      IMAGING STUDIES ECHOCARDIOGRAM COMPLETE  Result Date: 05/11/2022    ECHOCARDIOGRAM REPORT   Patient Name:   [name] Date of Exam: 05/11/2022 Medical Rec #:  [id number]           Height:       69.0 in Accession #:    [id number]          Weight:       250.0 lb Date of Birth:  10/11/1955  BSA:          2.272 m Patient Age:    67 years            BP:           151/80 mmHg Patient Gender: M                   HR:           53 bpm. Exam Location:  Inpatient Procedure: 2D Echo, Cardiac Doppler and Color Doppler Indications:    CHF - Acute Diastolic  History:        Patient has no prior history of Echocardiogram examinations.                 COPD; Risk Factors:Hypertension and Diabetes.  Sonographer:    Wallie Char Referring Phys: [id number] SYLVESTER I OGBATA  Sonographer Comments: Technically difficult study due to poor echo windows. Image acquisition challenging due to COPD. IMPRESSIONS  1. Left ventricular ejection fraction, by estimation, is 60 to 65%. The left ventricle has normal function. The left ventricle has no regional wall motion abnormalities. Left ventricular diastolic parameters are consistent  with Grade I diastolic dysfunction (impaired relaxation).  2. Right ventricular systolic function is normal. The right ventricular size is normal. There is mildly elevated pulmonary artery systolic pressure.  3. The mitral valve is normal in structure. No evidence of mitral valve regurgitation. No evidence of mitral stenosis.  4. The aortic valve has an indeterminant number of cusps. Aortic valve regurgitation is not visualized. No aortic stenosis is present.  5. The inferior vena cava is dilated in size with <50% respiratory variability, suggesting right atrial pressure of 15 mmHg. FINDINGS  Left Ventricle: Left ventricular ejection fraction, by estimation, is 60 to 65%. The left ventricle has normal function. The left ventricle has no regional wall motion abnormalities. The left ventricular internal cavity size was normal in size. There is  no left ventricular hypertrophy. Left ventricular diastolic parameters are consistent with Grade I diastolic dysfunction (impaired relaxation). Normal left ventricular filling pressure. Right Ventricle: The right ventricular size is normal. Right vetricular wall thickness was not well visualized. Right ventricular systolic function is normal. There is mildly elevated pulmonary artery systolic pressure. The tricuspid regurgitant velocity  is 2.43 m/s, and with an assumed right atrial pressure of 15 mmHg, the estimated right ventricular systolic pressure is 38.6 mmHg. Left Atrium: Left atrial size was normal in size. Right Atrium: Right atrial size was normal in size. Pericardium: There is no evidence of pericardial effusion. Mitral Valve: The mitral valve is normal in structure. No evidence of mitral valve regurgitation. No evidence of mitral valve stenosis. MV peak gradient, 4.6 mmHg. The mean mitral valve gradient is 2.0 mmHg. Tricuspid Valve: The tricuspid valve is normal in structure. Tricuspid valve regurgitation is not demonstrated. No evidence of tricuspid stenosis.  Aortic Valve: The aortic valve has an indeterminant number of cusps. Aortic valve regurgitation is not visualized. No aortic stenosis is present. Aortic valve mean gradient measures 6.0 mmHg. Aortic valve peak gradient measures 11.1 mmHg. Aortic valve area, by VTI measures 2.04 cm. Pulmonic Valve: The pulmonic valve was not well visualized. Pulmonic valve regurgitation is not visualized. No evidence of pulmonic stenosis. Aorta: The aortic root is normal in size and structure. Venous: The inferior vena cava is dilated in size with less than 50% respiratory variability, suggesting right atrial pressure of 15 mmHg. IAS/Shunts: No atrial level shunt detected by color flow Doppler.  LEFT VENTRICLE PLAX 2D LVIDd:         5.00 cm      Diastology LVIDs:         3.60 cm      LV e' medial:    9.97 cm/s LV PW:         0.80 cm      LV E/e' medial:  9.0 LV IVS:        0.90 cm      LV e' lateral:   12.10 cm/s LVOT diam:     1.90 cm      LV E/e' lateral: 7.4 LV SV:         63 LV SV Index:   28 LVOT Area:     2.84 cm  LV Volumes (MOD) LV vol d, MOD A2C: 69.3 ml LV vol d, MOD A4C: 108.0 ml LV vol s, MOD A2C: 32.3 ml LV vol s, MOD A4C: 49.8 ml LV SV MOD A2C:     37.0 ml LV SV MOD A4C:     108.0 ml LV SV MOD BP:      46.4 ml RIGHT VENTRICLE             IVC RV Basal diam:  4.20 cm     IVC diam: 2.50 cm RV S prime:     16.10 cm/s TAPSE (M-mode): 3.1 cm LEFT ATRIUM             Index        RIGHT ATRIUM           Index LA diam:        4.20 cm 1.85 cm/m   RA Area:     17.10 cm LA Vol (A2C):   43.8 ml 19.28 ml/m  RA Volume:   46.20 ml  20.34 ml/m LA Vol (A4C):   42.2 ml 18.58 ml/m LA Biplane Vol: 44.6 ml 19.63 ml/m  AORTIC VALVE AV Area (Vmax):    2.00 cm AV Area (Vmean):   2.06 cm AV Area (VTI):     2.04 cm AV Vmax:           166.50 cm/s AV Vmean:          116.000 cm/s AV VTI:            0.310 m AV Peak Grad:      11.1 mmHg AV Mean Grad:      6.0 mmHg LVOT Vmax:         117.67 cm/s LVOT Vmean:        84.267 cm/s LVOT VTI:           0.223 m LVOT/AV VTI ratio: 0.72  AORTA Ao Root diam: 3.60 cm MITRAL VALVE                TRICUSPID VALVE MV Area (PHT): 3.85 cm     TR Peak grad:   23.6 mmHg MV Area VTI:   2.43 cm     TR Vmax:        243.00 cm/s MV Peak grad:  4.6 mmHg MV Mean grad:  2.0 mmHg     SHUNTS MV Vmax:       1.07 m/s     Systemic VTI:  0.22 m MV Vmean:      72.4 cm/s    Systemic Diam: 1.90 cm MV Decel Time: 197 msec MV E velocity: 90.00 cm/s MV A velocity: 101.00 cm/s MV E/A ratio:  0.89 Dina Rich MD Electronically  signed by Dina Rich MD Signature Date/Time: 05/11/2022/2:09:01 PM    Final    DG CHEST PORT 1 VIEW  Result Date: 05/11/2022 CLINICAL DATA:  Pneumonia.  Rule out CHF. EXAM: PORTABLE CHEST 1 VIEW COMPARISON:  May 10, 2022 chest x-ray FINDINGS: Opacity in left base has resolved. The heart, hila, mediastinum, lungs, and pleura are normal. No pneumonia. No overt edema. IMPRESSION: No active disease. Electronically Signed   By: Gerome Sam III M.D.   On: 05/11/2022 12:00   DG Chest Port 1 View  Result Date: 05/10/2022 CLINICAL DATA:  Shortness of breath EXAM: PORTABLE CHEST 1 VIEW COMPARISON:  06/25/2021 FINDINGS: Hazy appearance of the lower left chest on the first view which is less pronounced on the second view. No edema, effusion, or pneumothorax. Normal heart size and mediastinal contours. IMPRESSION: Mild hazy density at the left base, bronchopneumonia or atelectasis could give this appearance. Electronically Signed   By: Tiburcio Pea M.D.   On: 05/10/2022 04:44    DISCHARGE EXAMINATION: Vitals:   05/12/22 1915 05/12/22 2051 05/13/22 0455 05/13/22 0858  BP: (!) 147/91  (!) 148/91   Pulse: 94  87   Resp: 20  20   Temp: 98.1 F (36.7 C)  97.6 F (36.4 C)   TempSrc: Oral  Oral   SpO2: 93% 96% 92% 93%  Weight:      Height:       General appearance: Awake alert.  In no distress Resp: Clear to auscultation bilaterally.  Normal effort Cardio: S1-S2 is normal regular.  No S3-S4.  No  rubs murmurs or bruit GI: Abdomen is soft.  Nontender nondistended.  Bowel sounds are present normal.  No masses organomegaly   DISPOSITION: Home  Discharge Instructions     Call MD for:  difficulty breathing, headache or visual disturbances   Complete by: As directed    Call MD for:  extreme fatigue   Complete by: As directed    Call MD for:  persistant dizziness or light-headedness   Complete by: As directed    Call MD for:  persistant nausea and vomiting   Complete by: As directed    Call MD for:  severe uncontrolled pain   Complete by: As directed    Call MD for:  temperature >100.4   Complete by: As directed    Diet - low sodium heart healthy   Complete by: As directed    Discharge instructions   Complete by: As directed    Please stop smoking cigarettes.  Please take your medications as prescribed.  Please be sure to follow-up with your primary care provider within 1 week after discharge.  You were cared for by a hospitalist during your hospital stay. If you have any questions about your discharge medications or the care you received while you were in the hospital after you are discharged, you can call the unit and asked to speak with the hospitalist on call if the hospitalist that took care of you is not available. Once you are discharged, your primary care physician will handle any further medical issues. Please note that NO REFILLS for any discharge medications will be authorized once you are discharged, as it is imperative that you return to your primary care physician (or establish a relationship with a primary care physician if you do not have one) for your aftercare needs so that they can reassess your need for medications and monitor your lab values. If you do not have a primary care physician,  you can call [phone] for a physician referral.   Increase activity slowly   Complete by: As directed           Allergies as of 05/13/2022       Reactions   Codeine Itching    Penicillins Itching, Other (See Comments)        Medication List     STOP taking these medications    buPROPion 150 MG 24 hr tablet Commonly known as: Wellbutrin XL       TAKE these medications    albuterol 108 (90 Base) MCG/ACT inhaler Commonly known as: Ventolin HFA Inhale 2 puffs into the lungs every 6 (six) hours as needed for wheezing or shortness of breath.   atorvastatin 20 MG tablet Commonly known as: LIPITOR Take 1 tablet (20 mg total) by mouth daily.   Breztri Aerosphere 160-9-4.8 MCG/ACT Aero Generic drug: Budeson-Glycopyrrol-Formoterol Inhale 2 puffs into the lungs 2 (two) times daily.   chlorthalidone 25 MG tablet Commonly known as: HYGROTON Take 1 tablet (25 mg total) by mouth daily.   doxycycline 100 MG tablet Commonly known as: VIBRA-TABS Take 1 tablet (100 mg total) by mouth every 12 (twelve) hours for 4 days.   ipratropium-albuterol 0.5-2.5 (3) MG/3ML Soln Commonly known as: DUONEB TAKE 3 MLS BY NEBULIZATION EVERY 6 (SIX) HOURS AS NEEDED (SEVERE WHEEZING). What changed: reasons to take this   Misc. Devices Misc Nebulizer device.  Diagnosis COPD   predniSONE 20 MG tablet Commonly known as: DELTASONE Take 3 tablets once daily for 3 days followed by 2 tablets once daily for 3 days followed by 1 tablet once daily for 3 days and then stop   valsartan 160 MG tablet Commonly known as: DIOVAN Take 1 tablet (160 mg total) by mouth daily.               Durable Medical Equipment  (From admission, onward)           Start     Ordered   05/12/22 0940  For home use only DME Nebulizer machine  Once       Question Answer Comment  Patient needs a nebulizer to treat with the following condition COPD with acute exacerbation (HCC)   Length of Need Lifetime      05/12/22 0939              Follow-up Information     Hoy Register, MD Follow up.   Specialty: Family Medicine Why: post hospitalization follow up Contact  information: [address] [place] [address] [phone]         [facility] Follow up.   Why: [employer] will be delivering nebulizer machine to your house Contact information: [address] [place] [address] [phone]                 TOTAL DISCHARGE TIME: 35 minutes  Leor Whyte Rito Ehrlich  [facility] Pager on [email or web address]  05/13/2022, 3:18 PM

## 2022-05-13 NOTE — TOC Transition Note (Signed)
Transition of Care [facility]) - CM/SW Discharge Note   Patient Details  Name: [name] MRN: [id number] Date of Birth: 1955/12/31  Transition of Care [facility]) CM/SW Contact:  Otelia Santee, LCSW Phone Number: 05/13/2022, 10:12 AM   Clinical Narrative:    Pt in need of home nebulize machine. Pt discharging today and requesting nebulizer machine be delivered to his home. DME has been ordered through [employer] who will deliver machine to pt's home.    Final next level of care: Home/Self Care Barriers to Discharge: No Barriers Identified   Patient Goals and CMS Choice CMS Medicare.gov Compare Post Acute Care list provided to:: Patient Choice offered to / list presented to : Patient  Discharge Placement                         Discharge Plan and Services Additional resources added to the After Visit Summary for                  DME Arranged: Nebulizer machine DME Agency: [employer] Date DME Agency Contacted: 05/13/22 Time DME Agency Contacted: 1012 Representative spoke with at DME Agency: Tommye Standard            Social Determinants of Health (SDOH) Interventions SDOH Screenings   Food Insecurity: No Food Insecurity (05/10/2022)  Housing: Medium Risk (05/10/2022)  Transportation Needs: No Transportation Needs (05/10/2022)  Utilities: Not At Risk (05/10/2022)  Alcohol Screen: Low Risk  (05/06/2022)  Depression (PHQ2-9): Low Risk  (05/06/2022)  Financial Resource Strain: Medium Risk (05/06/2022)  Physical Activity: Inactive (05/06/2022)  Social Connections: Moderately Integrated (05/06/2022)  Stress: No Stress Concern Present (05/06/2022)  Tobacco Use: High Risk (05/10/2022)     Readmission Risk Interventions    05/13/2022   10:11 AM  Readmission Risk Prevention Plan  Post Dischage Appt Complete  Medication Screening Complete  Transportation Screening Complete

## 2022-05-13 NOTE — Telephone Encounter (Signed)
Pt called requesting a nebulizer machine, please advise

## 2022-05-14 ENCOUNTER — Telehealth: Payer: Self-pay

## 2022-05-14 NOTE — Telephone Encounter (Signed)
Okay to write

## 2022-05-14 NOTE — Telephone Encounter (Signed)
Ok to write script? 

## 2022-05-14 NOTE — Transitions of Care (Post Inpatient/ED Visit) (Signed)
05/14/2022  Name: [name] MRN: [id number] DOB: 1955-06-27  Today's TOC FU Call Status: Today's TOC FU Call Status:: Successful TOC FU Call Competed TOC FU Call Complete Date: 05/14/22  Transition Care Management Follow-up Telephone Call Date of Discharge: 05/13/22 Discharge Facility: [facility]) Type of Discharge: Inpatient Admission Primary Inpatient Discharge Diagnosis:: "COPD exacerbation,CAP" How have you been since you were released from the hospital?: Better (Pt states he has been doing okay. He did have one episode of SOB around 2am this morning when he was trying to sleep. Pt. states it did not last long-he got up-went outside and got some fresh air and sxs resolved) Any questions or concerns?: No  Items Reviewed: Did you receive and understand the discharge instructions provided?: Yes Medications obtained and verified?: Yes (Medications Reviewed) Any new allergies since your discharge?: No Dietary orders reviewed?: Yes Type of Diet Ordered:: low salt/heart healthy Do you have support at home?: Yes People in Home: significant other Name of Support/Comfort Primary Source: [name]  Home Care and Equipment/Supplies: Were Home Health Services Ordered?: NA Any new equipment or medical supplies ordered?: Yes Name of Medical supply agency?: [employer]neb machine Were you able to get the equipment/medical supplies?: No Do you have any questions related to the use of the equipment/supplies?: Yes What questions do you have?: Pt states he has not received neb machine. He has called PCP office to request script. Advised pt that script/order was already obtained while inpatient and sent to DME-[employer]. Pt states he was told he would have to pay $19/month to rent DME. He does not want to rent-prefers to just buy one as it would be cheaper.  Functional Questionnaire: Do you need assistance with bathing/showering or dressing?: No Do you need assistance with meal  preparation?: No Do you need assistance with eating?: No Do you have difficulty maintaining continence: No Do you need assistance with getting out of bed/getting out of a chair/moving?: No Do you have difficulty managing or taking your medications?: No  Follow up appointments reviewed: PCP Follow-up appointment confirmed?: Yes Date of PCP follow-up appointment?: 06/04/22 Follow-up Provider: Dr. Alvis Lemmings Specialist [facility] Follow-up appointment confirmed?: NA Do you need transportation to your follow-up appointment?: No Do you understand care options if your condition(s) worsen?: Yes-patient verbalized understanding  SDOH Interventions Today    Flowsheet Row Most Recent Value  SDOH Interventions   Food Insecurity Interventions Intervention Not Indicated  Transportation Interventions Intervention Not Indicated      TOC Interventions Today    Flowsheet Row Most Recent Value  TOC Interventions   TOC Interventions Discussed/Reviewed TOC Interventions Discussed, S/S of infection, Contacted provider for patient needs      Interventions Today    Flowsheet Row Most Recent Value  Chronic Disease   Chronic disease during today's visit Chronic Obstructive Pulmonary Disease (COPD)  General Interventions   General Interventions Discussed/Reviewed General Interventions Discussed, Doctor Visits, Communication with, Durable Medical Equipment (DME)  Doctor Visits Discussed/Reviewed Doctor Visits Discussed, PCP  Durable Medical Equipment (DME) Other  [nebulizer]  PCP/Specialist Visits Compliance with follow-up visit  Communication with --  [Spoke w/ [facility] [phone]) to f/u on neb machine. He confirms referral/order was received and staff outreached pt and pt declined neb machine.]  Education Interventions   Education Provided Provided Education  Provided Verbal Education On When to see the doctor, Medication, Nutrition, Other  [resp sx mgmt]  Nutrition Interventions    Nutrition Discussed/Reviewed Nutrition Discussed, Adding fruits and vegetables, Decreasing fats, Decreasing salt  Pharmacy Interventions   Pharmacy Dicussed/Reviewed Pharmacy Topics Discussed, Medications and their functions  Safety Interventions   Safety Discussed/Reviewed Safety Discussed       Alessandra Grout [facility] Care Management Care Management Community Coordinator Direct Phone: [phone] Toll Free: [phone] Fax: [phone]

## 2022-05-15 ENCOUNTER — Other Ambulatory Visit: Payer: Self-pay

## 2022-05-15 ENCOUNTER — Telehealth: Payer: Self-pay

## 2022-05-15 DIAGNOSIS — J449 Chronic obstructive pulmonary disease, unspecified: Secondary | ICD-10-CM

## 2022-05-15 MED ORDER — MISC. DEVICES MISC: 0 refills | Status: DC

## 2022-05-15 NOTE — Telephone Encounter (Signed)
Order will be faxed to [facility].

## 2022-05-15 NOTE — Patient Outreach (Signed)
  Care Coordination   Follow Up Visit Note   05/15/2022 Name: Jagjit Riner MRN: [id number] DOB: [birth year]   Follow Up Call   Outreach call to patient.he is currently just returning home from running errands. States he is doing fairly- well today. He reports he had a very mild nosebleed this morning. Patient states pollen has been affecting is breathing. Provided education to pt on ways to manage and encouraged him to avoid resp triggers. Patients states he is still interested in buying neb machine out of pocket-awaiting to see if DME company([employer]) that PCP sent script to can provide device to him for free before purchasing one.States he has a friend who has a neb machine and has allowed him to use it.     Care Coordination Interventions:  Yes, provided   Follow up plan:  Patient advised to call for any other issues/concerns.    Encounter Outcome:  Pt. Visit Completed    Alessandra Grout [facility] Care Management Care Management Community Coordinator Direct Phone: [phone] Toll Free: [phone] Fax: [phone]

## 2022-05-20 ENCOUNTER — Telehealth: Payer: Self-pay | Admitting: Family Medicine

## 2022-05-20 ENCOUNTER — Ambulatory Visit: Payer: Self-pay | Admitting: *Deleted

## 2022-05-20 NOTE — Telephone Encounter (Signed)
He needs to schedule an office visit for evaluation.  One of his medications is a diuretic and so if he stopped that, it might explain the swelling he is having in his legs.  Okay to write prescription for nebulizer machine for him.

## 2022-05-20 NOTE — Telephone Encounter (Signed)
Copied from CRM [id number]. Topic: General - Other >> May 20, 2022  9:24 AM Carrielelia G wrote: Reason for CRM: patient needs a Community education officer For Albuterol

## 2022-05-20 NOTE — Telephone Encounter (Signed)
Summary: swelling, urination, medication   Recently in hospital, Both legs and foot are swollen, frequent urination, occasionally urinating on self,  ( needs a neNebulizer Machine For Albuterol, I sent CRM to office) , Patient has decided to stop certain medications           Chief Complaint: feet , leg swelling, rash nebulizer machine needed. Symptoms: after discharge from hospital noted bilateral feet and ankle swelling now up to knees and calf area, no severe pain. Frequent urination, incontinence. "Fine bumps" noted on penis around foreskin, black and itching. Reports he does not have sex . Just completed course of doxycyline from hospital meds. Stopped taking valsartan and prednisone due to sx of swelling in legs. Reports sx did not start until he started new medications. Valsartan, prednisone and doxycyline  Frequency: 1 week  Pertinent Negatives: Patient denies chest pain no difficulty breathing no fever reported.  Disposition: [] ED /[] Urgent Care (no appt availability in office) / [x] Appointment(In office/virtual)/ []  [facility]/ [] Home Care/ [] Refused Recommended Disposition /[] [place] [facility]/ []  Follow-up with PCP Additional Notes:   Appt scheduled for tomorrow with PCP. Please advise regarding medications. Patient requesting PCP to order nebulizer machine.        Reason for Disposition  [1] MODERATE leg swelling (e.g., swelling extends up to knees) AND [2] new-onset or worsening  Genital area rash  Answer Assessment - Initial Assessment Questions 1. ONSET: "When did the swelling start?" (e.g., minutes, hours, days)     After starting medications from hospital valsartan and prednisone and doxycyline  2. LOCATION: "What part of the leg is swollen?"  "Are both legs swollen or just one leg?"     Feet , ankles up to knees both legs 3. SEVERITY: "How bad is the swelling?" (e.g., localized; mild, moderate, severe)   - Localized: Small area of swelling  localized to one leg.   - MILD pedal edema: Swelling limited to foot and ankle, pitting edema < 1/4 inch (6 mm) deep, rest and elevation eliminate most or all swelling.   - MODERATE edema: Swelling of lower leg to knee, pitting edema > 1/4 inch (6 mm) deep, rest and elevation only partially reduce swelling.   - SEVERE edema: Swelling extends above knee, facial or hand swelling present.      na 4. REDNESS: "Does the swelling look red or infected?"     na 5. PAIN: "Is the swelling painful to touch?" If Yes, ask: "How painful is it?"   (Scale 1-10; mild, moderate or severe)     Yes  6. FEVER: "Do you have a fever?" If Yes, ask: "What is it, how was it measured, and when did it start?"      na 7. CAUSE: "What do you think is causing the leg swelling?"     Medication  8. MEDICAL HISTORY: "Do you have a history of blood clots (e.g., DVT), cancer, heart failure, kidney disease, or liver failure?"     See hx 9. RECURRENT SYMPTOM: "Have you had leg swelling before?" If Yes, ask: "When was the last time?" "What happened that time?"     No  10. OTHER SYMPTOMS: "Do you have any other symptoms?" (e.g., chest pain, difficulty breathing)       Denies chest pain difficulty breathing  11. PREGNANCY: "Is there any chance you are pregnant?" "When was your last menstrual period?"       na  Answer Assessment - Initial Assessment Questions 1. APPEARANCE of RASH: "Describe  the rash."      "Fine bumps " to shaft of penis and foreskin 2. LOCATION: "Where is the rash located?"      penis 3. NUMBER: "How many spots are there?"      nan 4. SIZE: "How big are the spots?" (Inches, centimeters or compare to size of a coin)      a 5. ONSET: "When did the rash start?"      After discharge from hospital and starting medications 1 week ago  6. ITCHING: "Does the rash itch?" If Yes, ask: "How bad is the itch?"  (Scale 0-10; or none, mild, moderate, severe)     Yes  7. PAIN: "Does the rash hurt?" If Yes, ask: "How bad  is the pain?"  (Scale 0-10; or none, mild, moderate, severe)    - NONE (0): no pain    - MILD (1-3): doesn't interfere with normal activities     - MODERATE (4-7): interferes with normal activities or awakens from sleep     - SEVERE (8-10): excruciating pain, unable to do any normal activities     na 8. OTHER SYMPTOMS: "Do you have any other symptoms?" (e.g., fever)     Black spots to foreskin, frequent urination, incontinence  9. PREGNANCY: "Is there any chance you are pregnant?" "When was your last menstrual period?"     na  Protocols used: Leg Swelling and Edema-A-AH, Rash or Redness - Localized-A-AH

## 2022-05-20 NOTE — Telephone Encounter (Signed)
Order has been faxed to [facility] they will each out to the patient with details.

## 2022-05-20 NOTE — Telephone Encounter (Signed)
FYI

## 2022-05-21 ENCOUNTER — Ambulatory Visit: Payer: Medicare HMO | Attending: Family Medicine | Admitting: Family Medicine

## 2022-05-21 ENCOUNTER — Other Ambulatory Visit: Payer: Self-pay

## 2022-05-21 ENCOUNTER — Emergency Department (HOSPITAL_COMMUNITY): Payer: Medicare HMO

## 2022-05-21 ENCOUNTER — Inpatient Hospital Stay (HOSPITAL_COMMUNITY)
Admission: EM | Admit: 2022-05-21 | Discharge: 2022-05-24 | DRG: 193 | Disposition: A | Discharge status: Home / Self Care | Payer: Medicare HMO | Attending: Internal Medicine | Admitting: Internal Medicine

## 2022-05-21 ENCOUNTER — Encounter (HOSPITAL_COMMUNITY): Payer: Self-pay

## 2022-05-21 VITALS — BP 164/82 | HR 112 | Ht 69.0 in | Wt 238.6 lb

## 2022-05-21 DIAGNOSIS — Z7951 Long term (current) use of inhaled steroids: Secondary | ICD-10-CM | POA: Diagnosis not present

## 2022-05-21 DIAGNOSIS — J168 Pneumonia due to other specified infectious organisms: Secondary | ICD-10-CM | POA: Diagnosis not present

## 2022-05-21 DIAGNOSIS — J441 Chronic obstructive pulmonary disease with (acute) exacerbation: Secondary | ICD-10-CM | POA: Diagnosis not present

## 2022-05-21 DIAGNOSIS — J189 Pneumonia, unspecified organism: Secondary | ICD-10-CM | POA: Diagnosis not present

## 2022-05-21 DIAGNOSIS — Z8249 Family history of ischemic heart disease and other diseases of the circulatory system: Secondary | ICD-10-CM

## 2022-05-21 DIAGNOSIS — Z720 Tobacco use: Secondary | ICD-10-CM | POA: Diagnosis not present

## 2022-05-21 DIAGNOSIS — Z800 Family history of malignant neoplasm of digestive organs: Secondary | ICD-10-CM

## 2022-05-21 DIAGNOSIS — J9601 Acute respiratory failure with hypoxia: Secondary | ICD-10-CM | POA: Diagnosis present

## 2022-05-21 DIAGNOSIS — Z6835 Body mass index (BMI) 35.0-35.9, adult: Secondary | ICD-10-CM

## 2022-05-21 DIAGNOSIS — J449 Chronic obstructive pulmonary disease, unspecified: Secondary | ICD-10-CM

## 2022-05-21 DIAGNOSIS — E785 Hyperlipidemia, unspecified: Secondary | ICD-10-CM | POA: Insufficient documentation

## 2022-05-21 DIAGNOSIS — I10 Essential (primary) hypertension: Secondary | ICD-10-CM

## 2022-05-21 DIAGNOSIS — E782 Mixed hyperlipidemia: Secondary | ICD-10-CM | POA: Diagnosis not present

## 2022-05-21 DIAGNOSIS — R399 Unspecified symptoms and signs involving the genitourinary system: Secondary | ICD-10-CM

## 2022-05-21 DIAGNOSIS — R0602 Shortness of breath: Secondary | ICD-10-CM | POA: Diagnosis not present

## 2022-05-21 DIAGNOSIS — E669 Obesity, unspecified: Secondary | ICD-10-CM | POA: Diagnosis not present

## 2022-05-21 DIAGNOSIS — J810 Acute pulmonary edema: Secondary | ICD-10-CM

## 2022-05-21 DIAGNOSIS — J9602 Acute respiratory failure with hypercapnia: Secondary | ICD-10-CM | POA: Diagnosis present

## 2022-05-21 DIAGNOSIS — Z880 Allergy status to penicillin: Secondary | ICD-10-CM | POA: Diagnosis not present

## 2022-05-21 DIAGNOSIS — Z885 Allergy status to narcotic agent status: Secondary | ICD-10-CM | POA: Diagnosis not present

## 2022-05-21 DIAGNOSIS — Z9189 Other specified personal risk factors, not elsewhere classified: Secondary | ICD-10-CM

## 2022-05-21 DIAGNOSIS — Z79899 Other long term (current) drug therapy: Secondary | ICD-10-CM | POA: Diagnosis not present

## 2022-05-21 DIAGNOSIS — R0902 Hypoxemia: Secondary | ICD-10-CM

## 2022-05-21 DIAGNOSIS — J440 Chronic obstructive pulmonary disease with acute lower respiratory infection: Secondary | ICD-10-CM | POA: Diagnosis not present

## 2022-05-21 DIAGNOSIS — N489 Disorder of penis, unspecified: Secondary | ICD-10-CM

## 2022-05-21 DIAGNOSIS — F17210 Nicotine dependence, cigarettes, uncomplicated: Secondary | ICD-10-CM | POA: Diagnosis present

## 2022-05-21 LAB — CBC WITH DIFFERENTIAL/PLATELET
Abs Immature Granulocytes: 0.06 10*3/uL (ref 0.00–0.07)
Basophils Absolute: 0 10*3/uL (ref 0.0–0.1)
Basophils Relative: 0 %
Eosinophils Absolute: 0.1 10*3/uL (ref 0.0–0.5)
Eosinophils Relative: 1 %
HCT: 44.7 % (ref 39.0–52.0)
Hemoglobin: 14.8 g/dL (ref 13.0–17.0)
Immature Granulocytes: 1 %
Lymphocytes Relative: 17 %
Lymphs Abs: 1.7 10*3/uL (ref 0.7–4.0)
MCH: 32.3 pg (ref 26.0–34.0)
MCHC: 33.1 g/dL (ref 30.0–36.0)
MCV: 97.6 fL (ref 80.0–100.0)
Monocytes Absolute: 1.2 10*3/uL — ABNORMAL HIGH (ref 0.1–1.0)
Monocytes Relative: 12 %
Neutro Abs: 6.7 10*3/uL (ref 1.7–7.7)
Neutrophils Relative %: 69 %
Platelets: 164 10*3/uL (ref 150–400)
RBC: 4.58 MIL/uL (ref 4.22–5.81)
RDW: 14.5 % (ref 11.5–15.5)
WBC: 9.8 10*3/uL (ref 4.0–10.5)
nRBC: 0 % (ref 0.0–0.2)

## 2022-05-21 LAB — TROPONIN I (HIGH SENSITIVITY)
Troponin I (High Sensitivity): 11 ng/L (ref <18)
Troponin I (High Sensitivity): 12 ng/L (ref <18)

## 2022-05-21 LAB — D-DIMER, QUANTITATIVE: D-Dimer, Quant: <0.27 ug/mL-FEU (ref 0.00–0.50)

## 2022-05-21 LAB — BASIC METABOLIC PANEL
Anion gap: 6 (ref 5–15)
BUN: 12 mg/dL (ref 8–23)
CO2: 34 mmol/L — ABNORMAL HIGH (ref 22–32)
Calcium: 8.5 mg/dL — ABNORMAL LOW (ref 8.9–10.3)
Chloride: 99 mmol/L (ref 98–111)
Creatinine, Ser: 1 mg/dL (ref 0.61–1.24)
GFR, Estimated: >60 mL/min (ref >60)
Glucose, Bld: 106 mg/dL — ABNORMAL HIGH (ref 70–99)
Potassium: 3.7 mmol/L (ref 3.5–5.1)
Sodium: 139 mmol/L (ref 135–145)

## 2022-05-21 LAB — LACTIC ACID, PLASMA
Lactic Acid, Venous: 0.8 mmol/L (ref 0.5–1.9)
Lactic Acid, Venous: 0.6 mmol/L (ref 0.5–1.9)

## 2022-05-21 LAB — BRAIN NATRIURETIC PEPTIDE: B Natriuretic Peptide: 23 pg/mL (ref 0.0–100.0)

## 2022-05-21 MED ORDER — SODIUM CHLORIDE 0.9 % IV SOLN
2.0000 g | Freq: Three times a day (TID) | INTRAVENOUS | Status: DC
  Administered 2022-05-22 – 2022-05-24 (×7): 2 g via INTRAVENOUS
  Filled 2022-05-21 (×7): qty 12.5

## 2022-05-21 MED ORDER — ACETAMINOPHEN 325 MG PO TABS: 650.0000 mg | ORAL_TABLET | Freq: Four times a day (QID) | ORAL | Status: DC | PRN

## 2022-05-21 MED ORDER — METHYLPREDNISOLONE SODIUM SUCC 125 MG IJ SOLR
125.0000 mg | Freq: Once | INTRAMUSCULAR | Status: AC
  Administered 2022-05-21: 125 mg via INTRAVENOUS
  Filled 2022-05-21: qty 2

## 2022-05-21 MED ORDER — ALBUTEROL SULFATE (2.5 MG/3ML) 0.083% IN NEBU
2.5000 mg | INHALATION_SOLUTION | RESPIRATORY_TRACT | Status: DC | PRN
  Administered 2022-05-23 (×2): 2.5 mg via RESPIRATORY_TRACT
  Filled 2022-05-21 (×2): qty 3

## 2022-05-21 MED ORDER — IPRATROPIUM-ALBUTEROL 0.5-2.5 (3) MG/3ML IN SOLN
3.0000 mL | Freq: Once | RESPIRATORY_TRACT | Status: AC
  Administered 2022-05-21: 3 mL via RESPIRATORY_TRACT
  Filled 2022-05-21: qty 3

## 2022-05-21 MED ORDER — VANCOMYCIN HCL 2000 MG/400ML IV SOLN
2000.0000 mg | Freq: Once | INTRAVENOUS | Status: AC
  Administered 2022-05-22: 2000 mg via INTRAVENOUS
  Filled 2022-05-21: qty 400

## 2022-05-21 MED ORDER — MELATONIN 3 MG PO TABS: 3.0000 mg | ORAL_TABLET | Freq: Every evening | ORAL | Status: DC | PRN

## 2022-05-21 MED ORDER — ONDANSETRON HCL 4 MG/2ML IJ SOLN: 4.0000 mg | Freq: Four times a day (QID) | INTRAMUSCULAR | Status: DC | PRN

## 2022-05-21 MED ORDER — VANCOMYCIN HCL 1750 MG/350ML IV SOLN: 1750.0000 mg | INTRAVENOUS | Status: DC

## 2022-05-21 MED ORDER — ACETAMINOPHEN 650 MG RE SUPP: 650.0000 mg | Freq: Four times a day (QID) | RECTAL | Status: DC | PRN

## 2022-05-21 MED ORDER — METHYLPREDNISOLONE SODIUM SUCC 125 MG IJ SOLR
80.0000 mg | Freq: Two times a day (BID) | INTRAMUSCULAR | Status: DC
  Administered 2022-05-22 – 2022-05-23 (×3): 80 mg via INTRAVENOUS
  Filled 2022-05-21 (×3): qty 2

## 2022-05-21 MED ORDER — MAGNESIUM SULFATE 2 GM/50ML IV SOLN
2.0000 g | Freq: Once | INTRAVENOUS | Status: AC
  Administered 2022-05-21: 2 g via INTRAVENOUS
  Filled 2022-05-21: qty 50

## 2022-05-21 MED ORDER — IOHEXOL 300 MG/ML  SOLN
100.0000 mL | Freq: Once | INTRAMUSCULAR | Status: AC | PRN
  Administered 2022-05-21: 100 mL via INTRAVENOUS

## 2022-05-21 MED ORDER — IPRATROPIUM-ALBUTEROL 0.5-2.5 (3) MG/3ML IN SOLN
3.0000 mL | Freq: Four times a day (QID) | RESPIRATORY_TRACT | Status: DC
  Administered 2022-05-22 (×2): 3 mL via RESPIRATORY_TRACT
  Filled 2022-05-21 (×2): qty 3

## 2022-05-21 MED ORDER — SODIUM CHLORIDE 0.9 % IV SOLN
2.0000 g | Freq: Once | INTRAVENOUS | Status: AC
  Administered 2022-05-21: 2 g via INTRAVENOUS
  Filled 2022-05-21: qty 12.5

## 2022-05-21 NOTE — ED Notes (Signed)
Patient transported to CT 

## 2022-05-21 NOTE — ED Notes (Signed)
Patient transported to x-ray. ?

## 2022-05-21 NOTE — Telephone Encounter (Signed)
Noted pt has appointment this afternoon to discuss.

## 2022-05-21 NOTE — Progress Notes (Signed)
Swelling in feet and legs SOB Frequent urination Bumps in private area.

## 2022-05-21 NOTE — Progress Notes (Signed)
A consult was received from an ED physician for Vancomycin per pharmacy dosing.  The patient's profile has been reviewed for ht/wt/allergies/indication/available labs.   A one time order has been placed for Vancomycin 2gm IV. Also increased Cefepime 2gm IV x1 Further antibiotics/pharmacy consults should be ordered by admitting physician if indicated.                       Thank you, Junita Push PharmD 05/21/2022  10:09 PM

## 2022-05-21 NOTE — Patient Instructions (Signed)
Please go to the emergency room right away as you do have fluid in your lungs, your oxygen level is very low and you need emergency care.

## 2022-05-21 NOTE — Progress Notes (Signed)
Subjective:  Patient ID: [name], male    DOB: Jul 08, 1955  Age: 67 y.o. MRN: [id number]  CC: Edema   HPI [name] is a 67 y.o. year old male with a history of hypertension, hyperlipidemia, COPD, tobacco dependence (greater than 40-pack-year) here for follow-up visit.  He is here for transitional care visit after hospitalization from 05/10/2022 through 05/13/2022 for COPD exacerbation.  Interval History:  He had called the Clinic stating he stopped some of his medications and on further questioning he stopped Valsartan because his feet were swelling.  He also discontinued prednisone due to pedal edema.  He complains that he had changed his medications during his last hospitalization. He also has been without chlorthalidone. Complaints of dyspnea, pedal edema and pain in his feet when he walks and also at rest. He is saturating at 86% at rest and he is not on Oxygen therapy. He has not been smoking x1 weeks but  has been chewing Nicotine. Echo from 04/2022 revealed EF of 60 to 65%, grade 1 DD, mildly elevated pulmonary artery systolic pressure.  He complains of increased urinary frequency ever since he was discharged from the hospital.  He has also noticed some bumps on his penis but denies presence of burning, itching and has not been sexually active in 3 weeks.  Past Medical History:  Diagnosis Date   Asthma    COPD (chronic obstructive pulmonary disease) (HCC)    Hx of adenomatous polyp of colon 01/13/2020   Hypertension     Past Surgical History:  Procedure Laterality Date   HAND SURGERY Bilateral left 4098,1191 Rt    Family History  Problem Relation Age of Onset   Hypertension Paternal Uncle    Cancer Maternal Grandmother    Hypertension Maternal Grandfather    Colon cancer Cousin    Colon polyps Neg Hx    Esophageal cancer Neg Hx    Stomach cancer Neg Hx    Rectal cancer Neg Hx     Social History   Socioeconomic History   Marital status:  Significant Other    Spouse name: Not on file   Number of children: Not on file   Years of education: Not on file   Highest education level: Not on file  Occupational History   Not on file  Tobacco Use   Smoking status: Some Days    Packs/day: 1.00    Years: 45.00    Additional pack years: 0.00    Total pack years: 45.00    Types: Cigarettes   Smokeless tobacco: Never  Vaping Use   Vaping Use: Never used  Substance and Sexual Activity   Alcohol use: Yes    Alcohol/week: 1.0 standard drink of alcohol    Types: 1 Standard drinks or equivalent per week    Comment: "every once in awhile"-3 times a month per pt   Drug use: Yes    Frequency: 1.0 times per week    Types: Marijuana    Comment: occasionally marijuana-3 times a month per pt   Sexual activity: Not on file  Other Topics Concern   Not on file  Social History Narrative   Not on file   Social Determinants of Health   Financial Resource Strain: Medium Risk (05/06/2022)   Overall Financial Resource Strain (CARDIA)    Difficulty of Paying Living Expenses: Somewhat hard  Food Insecurity: No Food Insecurity (05/14/2022)   Hunger Vital Sign    Worried About Running Out of Food in the  Last Year: Never true    Ran Out of Food in the Last Year: Never true  Transportation Needs: No Transportation Needs (05/14/2022)   PRAPARE - Administrator, Civil Service (Medical): No    Lack of Transportation (Non-Medical): No  Physical Activity: Inactive (05/06/2022)   Exercise Vital Sign    Days of Exercise per Week: 0 days    Minutes of Exercise per Session: 0 min  Stress: No Stress Concern Present (05/06/2022)   [employer] of Occupational Health - Occupational Stress Questionnaire    Feeling of Stress : Only a little  Social Connections: Moderately Integrated (05/06/2022)   Social Connection and Isolation Panel [NHANES]    Frequency of Communication with Friends and Family: More than three times a week    Frequency of  Social Gatherings with Friends and Family: More than three times a week    Attends Religious Services: 1 to 4 times per year    Active Member of [employer] or Organizations: No    Attends Banker Meetings: Never    Marital Status: Living with partner    Allergies  Allergen Reactions   Codeine Itching   Penicillins Itching and Other (See Comments)    Outpatient Medications Prior to Visit  Medication Sig Dispense Refill   albuterol (VENTOLIN HFA) 108 (90 Base) MCG/ACT inhaler Inhale 2 puffs into the lungs every 6 (six) hours as needed for wheezing or shortness of breath. 54 g 1   atorvastatin (LIPITOR) 20 MG tablet Take 1 tablet (20 mg total) by mouth daily. 90 tablet 1   Budeson-Glycopyrrol-Formoterol (BREZTRI AEROSPHERE) 160-9-4.8 MCG/ACT AERO Inhale 2 puffs into the lungs 2 (two) times daily. 10.7 g 6   chlorthalidone (HYGROTON) 25 MG tablet Take 1 tablet (25 mg total) by mouth daily. 90 tablet 1   ipratropium-albuterol (DUONEB) 0.5-2.5 (3) MG/3ML SOLN TAKE 3 MLS BY NEBULIZATION EVERY 6 (SIX) HOURS AS NEEDED (SEVERE WHEEZING). (Patient taking differently: Take 3 mLs by nebulization every 6 (six) hours as needed (severe wheezing).) 120 mL 3   Misc. Devices MISC Nebulizer device.  Diagnosis COPD 1 each 0   predniSONE (DELTASONE) 20 MG tablet Take 3 tablets once daily for 3 days followed by 2 tablets once daily for 3 days followed by 1 tablet once daily for 3 days and then stop 18 tablet 0   valsartan (DIOVAN) 160 MG tablet Take 1 tablet (160 mg total) by mouth daily. 90 tablet 3   No facility-administered medications prior to visit.     ROS Review of Systems  Constitutional:  Negative for activity change and appetite change.  HENT:  Negative for sinus pressure and sore throat.   Respiratory:  Positive for shortness of breath. Negative for chest tightness and wheezing.   Cardiovascular:  Positive for leg swelling. Negative for chest pain and palpitations.  Gastrointestinal:   Negative for abdominal distention, abdominal pain and constipation.  Genitourinary: Negative.   Musculoskeletal: Negative.   Psychiatric/Behavioral:  Negative for behavioral problems and dysphoric mood.     Objective:  BP (!) 164/82   Pulse (!) 112   Ht 5\' 9"  (1.753 m)   Wt 238 lb 9.6 oz (108.2 kg)   SpO2 (!) 86% Comment: placed on 3L of Oxygen  BMI 35.24 kg/m      05/21/2022    3:05 PM 05/13/2022    4:55 AM 05/12/2022    7:15 PM  BP/Weight  Systolic BP 164 148 147  Diastolic BP 82 91 91  Wt. (Lbs) 238.6    BMI 35.24 kg/m2        Physical Exam Constitutional:      Appearance: He is well-developed.  Cardiovascular:     Rate and Rhythm: Tachycardia present.     Heart sounds: Normal heart sounds. No murmur heard. Pulmonary:     Effort: Pulmonary effort is normal.     Breath sounds: Rales present. No wheezing.  Chest:     Chest wall: No tenderness.  Abdominal:     General: Bowel sounds are normal. There is no distension.     Palpations: Abdomen is soft. There is no mass.     Tenderness: There is no abdominal tenderness.  Genitourinary:    Comments: Single nontender lesion on dorsum of penis Musculoskeletal:        General: Normal range of motion.     Right lower leg: Edema (2+ pitting) present.     Left lower leg: Edema (2+ pitting) present.  Neurological:     Mental Status: He is alert and oriented to person, place, and time.  Psychiatric:        Mood and Affect: Mood normal.        Latest Ref Rng & Units 05/12/2022    5:37 AM 05/11/2022    6:14 AM 05/10/2022    9:09 AM  CMP  Glucose 70 - 99 mg/dL 098  119    BUN 8 - 23 mg/dL 16  19    Creatinine 1.47 - 1.24 mg/dL 8.29  5.62  1.30   Sodium 135 - 145 mmol/L 137  137    Potassium 3.5 - 5.1 mmol/L 3.9  4.7    Chloride 98 - 111 mmol/L 96  102    CO2 22 - 32 mmol/L 28  29    Calcium 8.9 - 10.3 mg/dL 8.8  8.6      Lipid Panel     Component Value Date/Time   CHOL 183 06/19/2021 0917   TRIG 50 06/19/2021  0917   HDL 50 06/19/2021 0917   CHOLHDL 4.0 04/27/2020 1659   CHOLHDL 4.8 07/15/2013 1152   VLDL 19 07/15/2013 1152   LDLCALC 123 (H) 06/19/2021 0917    CBC    Component Value Date/Time   WBC 7.0 05/11/2022 0614   RBC 4.33 05/11/2022 0614   HGB 14.3 05/11/2022 0614   HGB 15.1 06/19/2021 0917   HCT 43.1 05/11/2022 0614   HCT 44.4 06/19/2021 0917   PLT 154 05/11/2022 0614   PLT 179 06/19/2021 0917   MCV 99.5 05/11/2022 0614   MCV 93 06/19/2021 0917   MCH 33.0 05/11/2022 0614   MCHC 33.2 05/11/2022 0614   RDW 14.3 05/11/2022 0614   RDW 13.2 06/19/2021 0917   LYMPHSABS 2.0 05/10/2022 0405   LYMPHSABS 1.6 06/19/2021 0917   MONOABS 0.9 05/10/2022 0405   EOSABS 0.1 05/10/2022 0405   EOSABS 0.1 06/19/2021 0917   BASOSABS 0.0 05/10/2022 0405   BASOSABS 0.0 06/19/2021 0917    Lab Results  Component Value Date   HGBA1C 5.9 (H) 04/27/2020    Assessment & Plan:  1. COPD exacerbation (HCC) Currently with COPD exacerbation He never completed discharge dose of prednisone due to pedal edema He does have superimposed pulmonary edema on his COPD exacerbation Will need inpatient treatment  2. Acute pulmonary edema (HCC) Currently in fluid overload with associated hypoxia Placed on 2 L of oxygen in the clinic Referred to the ED right away - Pro b natriuretic peptide  3. Penile lesion - Ambulatory referral to Urology  4. Lower urinary tract symptoms Will check PSA and if normal will place on Flomax - PSA, total and free  5. Hypoxia In the setting of COPD exacerbation and pulmonary edema Oxygen saturation at rest is 86% He will need chest imaging and further evaluation  6. Essential hypertension Uncontrolled due to the fact that he is not taking any antihypertensive I will see him back once he has been discharged from the hospital to readjust his antihypertensive regimen    No orders of the defined types were placed in this encounter.   Follow-up: Return for Medical  conditions with PCP, call once discharged from the ED.Hoy Register, MD, FAAFP. [facility] [place], [place] [phone]   05/21/2022, 3:52 PM

## 2022-05-21 NOTE — H&P (Signed)
History and Physical      [name] RUE:[id number] DOB: [birth year] DOA: 05/21/2022; DOS: 05/21/2022  PCP: Daniel Register, MD  Patient coming from: home   I have personally briefly reviewed patient's old medical records in [name]  Chief Complaint: Shortness of breath  HPI: [name] is a 67 y.o. male with medical history significant for COPD, essential hypertension, hyperlipidemia, who is admitted to [facility] on 05/21/2022 with acute COPD exacerbation after presenting from home to [facility] ED complaining of shortness of breath.   The patient was recently hospitalized within the [facility] health system from 05/10/2022 to 05/13/2022 for acute COPD exacerbation during which a component of his management of reportedly included IV antibiotics.  He was socially discharged home on 05/13/2022 on prednisone taper.  However, in spite of good compliance with his outpatient respiratory regimen as well as aforementioned prednisone taper, the patient presents this evening to the [facility] emergency department complaining of 2 to 3 days of progressive shortness of breath associated with worsening cough, now productive, in the absence of Oxis, and associated with wheezing and subjective fever.  He denies any associated orthopnea, PND, or worsening of peripheral edema.  Not associate with any chest pain, palpitations, diaphoresis, nausea, vomiting, dizziness, presyncope, or syncope.  No interval trauma.  He denies any known baseline supplemental oxygen requirements.  Denies any headache, neck stiffness, rash, dysuria or gross hematuria.    ED Course:  Vital signs in the ED were notable for the following: Afebrile; heart rates in the 80s to 1 teens; systolic blood pressures in the 130s to 160s; respiratory rate 14-22; initial oxygen saturation 83% on room air, Sosan improving into the range 9395% on 3 L nasal cannula.  Labs were notable for the following: BMP notable for the  following: Sodium 139, bicarbonate 34 compared to most recent prior HCO3 of 28 on 05/12/2022, creatinine 1.0.  BNP 23.  High-sensitivity troponin I initially 11, with repeat value noted to be 12.  D-dimer less than 0.27.  Initial lactic acid 0.8, with repeat value trending down to 0.6.  CBC notable for white blood cell count 9800, with hemoglobin 14.8.  Blood cultures x 2 were collected prior to initiation of IV antibiotics.  Per my interpretation, EKG in ED demonstrated the following: Sinus tachycardia with heart rate 100, normal intervals, no evidence of T wave or ST changes, including no evidence of ST elevation.  Imaging and additional notable ED work-up: CTA chest with pulmonary embolism protocol showed no evidence of acute pulmonary embolism, while demonstrating evidence of left lower lobe airspace opacity concerning for pneumonia, and showing no evidence of edema, effusion, or pneumothorax.  While in the ED, the following were administered: Duo nebulizer x 2 treatments, Solu-Medrol 125 mg IV x 1, cefepime, IV vancomycin, 2 g of IV magnesium sulfate.  Subsequently, the patient was admitted for further evaluation management of acute COPD exacerbation, suspected HCAP pneumonia as well as corresponding acute hypoxic respiratory distress.    Review of Systems: As per HPI otherwise 10 point review of systems negative.   Past Medical History:  Diagnosis Date   Asthma    COPD (chronic obstructive pulmonary disease) (HCC)    Hx of adenomatous polyp of colon 01/13/2020   Hypertension     Past Surgical History:  Procedure Laterality Date   HAND SURGERY Bilateral left 1478,[id number] Rt    Social History:  reports that he has been smoking cigarettes. He has a 45.00 pack-year smoking  history. He has never used smokeless tobacco. He reports current alcohol use of about 1.0 standard drink of alcohol per week. He reports current drug use. Frequency: 1.00 time per week. Drug: Marijuana.   Allergies   Allergen Reactions   Codeine Itching   Penicillins Itching and Other (See Comments)    Family History  Problem Relation Age of Onset   Hypertension Paternal Uncle    Cancer Maternal Grandmother    Hypertension Maternal Grandfather    Colon cancer Cousin    Colon polyps Neg Hx    Esophageal cancer Neg Hx    Stomach cancer Neg Hx    Rectal cancer Neg Hx     Family history reviewed and not pertinent    Prior to Admission medications   Medication Sig Start Date End Date Taking? Authorizing Provider  albuterol (VENTOLIN HFA) 108 (90 Base) MCG/ACT inhaler Inhale 2 puffs into the lungs every 6 (six) hours as needed for wheezing or shortness of breath. 05/13/22   Osvaldo Shipper, MD  atorvastatin (LIPITOR) 20 MG tablet Take 1 tablet (20 mg total) by mouth daily. 07/23/21   Daniel Register, MD  Budeson-Glycopyrrol-Formoterol (BREZTRI AEROSPHERE) 160-9-4.8 MCG/ACT AERO Inhale 2 puffs into the lungs 2 (two) times daily. 10/16/20   Daniel Register, MD  chlorthalidone (HYGROTON) 25 MG tablet Take 1 tablet (25 mg total) by mouth daily. 01/30/22   Newlin, Odette Horns, MD  ipratropium-albuterol (DUONEB) 0.5-2.5 (3) MG/3ML SOLN TAKE 3 MLS BY NEBULIZATION EVERY 6 (SIX) HOURS AS NEEDED (SEVERE WHEEZING). Patient taking differently: Take 3 mLs by nebulization every 6 (six) hours as needed (severe wheezing). 01/30/22   Daniel Register, MD  Misc. Devices MISC Nebulizer device.  Diagnosis COPD 05/15/22   Daniel Register, MD  predniSONE (DELTASONE) 20 MG tablet Take 3 tablets once daily for 3 days followed by 2 tablets once daily for 3 days followed by 1 tablet once daily for 3 days and then stop 05/13/22   Osvaldo Shipper, MD  valsartan (DIOVAN) 160 MG tablet Take 1 tablet (160 mg total) by mouth daily. 07/05/21   Parke Poisson, MD     Objective    Physical Exam: Vitals:   05/21/22 1918 05/21/22 1919 05/21/22 2000 05/21/22 2230  BP: (!) 173/98  (!) 168/90 138/74  Pulse: (!) 114  (!) 106 88  Resp: (!) 22   (!) 21 (!) 21  Temp: 98.4 F (36.9 C)     TempSrc: Oral     SpO2: (!) 83%  94% 95%  Weight:  109 kg    Height:  5\' 9"  (1.753 m)      General: appears to be stated age; alert, oriented; mildly increased work of breathing noted. Skin: warm, dry, no rash Head:  AT/Savage Mouth:  Oral mucosa membranes appear moist, normal dentition Neck: supple; trachea midline Heart:  RRR; did not appreciate any M/R/G Lungs: Bilateral expiratory wheezes noted, otherwise, CTAB, did not appreciate any rales, or rhonchi Abdomen: + BS; soft, ND, NT Vascular: 2+ pedal pulses b/l; 2+ radial pulses b/l Extremities: no peripheral edema, no muscle wasting Neuro: strength and sensation intact in upper and lower extremities b/l     Labs on Admission: I have personally reviewed following labs and imaging studies  CBC: Recent Labs  Lab 05/21/22 1939  WBC 9.8  NEUTROABS 6.7  HGB 14.8  HCT 44.7  MCV 97.6  PLT 164   Basic Metabolic Panel: Recent Labs  Lab 05/21/22 1939  NA 139  K 3.7  CL 99  CO2 34*  GLUCOSE 106*  BUN 12  CREATININE 1.00  CALCIUM 8.5*   GFR: Estimated Creatinine Clearance: 89.6 mL/min (by C-G formula based on SCr of 1 mg/dL). Liver Function Tests: No results for input(s): "AST", "ALT", "ALKPHOS", "BILITOT", "PROT", "ALBUMIN" in the last 168 hours. No results for input(s): "LIPASE", "AMYLASE" in the last 168 hours. No results for input(s): "AMMONIA" in the last 168 hours. Coagulation Profile: No results for input(s): "INR", "PROTIME" in the last 168 hours. Cardiac Enzymes: No results for input(s): "CKTOTAL", "CKMB", "CKMBINDEX", "TROPONINI" in the last 168 hours. BNP (last 3 results) No results for input(s): "PROBNP" in the last 8760 hours. HbA1C: No results for input(s): "HGBA1C" in the last 72 hours. CBG: No results for input(s): "GLUCAP" in the last 168 hours. Lipid Profile: No results for input(s): "CHOL", "HDL", "LDLCALC", "TRIG", "CHOLHDL", "LDLDIRECT" in the last 72  hours. Thyroid Function Tests: No results for input(s): "TSH", "T4TOTAL", "FREET4", "T3FREE", "THYROIDAB" in the last 72 hours. Anemia Panel: No results for input(s): "VITAMINB12", "FOLATE", "FERRITIN", "TIBC", "IRON", "RETICCTPCT" in the last 72 hours. Urine analysis:    Component Value Date/Time   COLORURINE YELLOW 06/11/2017 1927   APPEARANCEUR CLEAR 06/11/2017 1927   LABSPEC 1.014 06/11/2017 1927   PHURINE 5.0 06/11/2017 1927   GLUCOSEU NEGATIVE 06/11/2017 1927   HGBUR MODERATE (A) 06/11/2017 1927   BILIRUBINUR NEGATIVE 06/11/2017 1927   KETONESUR NEGATIVE 06/11/2017 1927   PROTEINUR NEGATIVE 06/11/2017 1927   UROBILINOGEN 1.0 05/28/2014 1848   NITRITE NEGATIVE 06/11/2017 1927   LEUKOCYTESUR NEGATIVE 06/11/2017 1927    Radiological Exams on Admission: CT Angio Chest PE W and/or Wo Contrast  Result Date: 05/21/2022 CLINICAL DATA:  Shortness of breath and hypoxia. Ankle swelling. PE suspected. EXAM: CT ANGIOGRAPHY CHEST WITH CONTRAST TECHNIQUE: Multidetector CT imaging of the chest was performed using the standard protocol during bolus administration of intravenous contrast. Multiplanar CT image reconstructions and MIPs were obtained to evaluate the vascular anatomy. RADIATION DOSE REDUCTION: This exam was performed according to the departmental dose-optimization program which includes automated exposure control, adjustment of the mA and/or kV according to patient size and/or use of iterative reconstruction technique. CONTRAST:  OMNIPAQUE IOHEXOL 300 MG/ML  SOLN COMPARISON:  Chest radiographs 05/21/2022; CT 07/27/2021 and CT 06/25/2021 FINDINGS: Cardiovascular: Satisfactory opacification of the pulmonary arteries to the segmental level. No pulmonary embolism. Normal heart size. Coronary artery and aortic atherosclerotic calcification. No pericardial effusion. Mediastinum/Nodes: Unremarkable esophagus.  No thoracic adenopathy. Lungs/Pleura: Mild emphysema in the upper lungs. Diffuse  bronchial wall thickening with mucous plugging greatest in the lower lobes. Bilateral centrilobular micro nodules and tree-in-bud opacities greatest in the lower lobes. Subpleural focal consolidation in the medial left lower lobe. No pleural effusion or pneumothorax. The Upper Abdomen: No acute abnormality. Musculoskeletal: Thoracic spondylosis with bridging anterior osteophytes. No acute fracture. Review of the MIP images confirms the above findings. IMPRESSION: 1. No evidence of acute pulmonary embolism. 2. Findings compatible with small airway infection/inflammation possible developing pneumonia in the left lower lobe. Follow-up after treatment is recommended to ensure resolution. Aortic Atherosclerosis (ICD10-I70.0) and Emphysema (ICD10-J43.9). Electronically Signed   By: Minerva Fester M.D.   On: 05/21/2022 21:28   DG Chest 2 View  Result Date: 05/21/2022 CLINICAL DATA:  Shortness of breath. EXAM: CHEST - 2 VIEW COMPARISON:  May 11, 2022. FINDINGS: The heart size and mediastinal contours are within normal limits. Both lungs are clear. The visualized skeletal structures are unremarkable. IMPRESSION: No active cardiopulmonary disease. Electronically Signed  By: Lupita Raider M.D.   On: 05/21/2022 20:18      Assessment/Plan    Principal Problem:   COPD with acute exacerbation (HCC) Active Problems:   Tobacco abuse   Acute hypoxic respiratory failure (HCC)   Essential hypertension   HCAP (healthcare-associated pneumonia)   Hyperlipidemia     #) Acute COPD exacerbation: in the context of a documented history of COPD, diagnosis of acute exacerbation on the basis of 2 3 days of progressive shortness of breath associate with increased work of breathing, wheezing, worsening cough, and complicated by acute Evoxac respiratory distress, with presenting CTA chest showing evidence of interval development of left lower lobe pneumonia, likely representing contributory factor leading to this acute  COPD exacerbation .  The patient was recently discharged to home after hospitalization for acute COPD exacerbation, and noted the above symptoms in spite of good compliance on outpatient respiratory regimen as well as upon discharge prednisone taper.  As noted above, CT chest shows no evidence of pleural effusion or pneumothorax or any evidence of acute pulmonary embolism.  No clinical or radiographic evidence to suggest acutely decompensated heart failure at this time. Additionally, ACS appears less likely in the absence of chest pain, with EKG showing no evidence of acute ischemic changes, and with negative Troponin results.   Plan: monitor continuous pulse oxymetry. Monitor on telemetry. Solumedrol. Scheduled duonebs q6 hours. Prn albuterol inhaler.  CMP in the morning. Repeat CBC in the morning. Check serum Mg and Phos levels. Will attempt additional chart review to evaluate most recent PFT results.  Further evaluation management of suspected HCAP pneumonia, including IV antibiotics as further detailed below. Check blood gas. Add-on procalcitonin.              #) HCAP pneumonia: Appears to meet criteria for HCAP pneumonia in the context of recent hospitalization during which she received IV antibiotics, with presenting CTA chest showing interval development of left lower lobe airspace opacity consistent with pneumonia.  Consequently, will initiate broad-spectrum IV antibiotics, including coverage for potential resistant organisms, including MRSA and pseudomonal coverage. Check MRSA PCR, which, if negative, can consider discontinuation of IV vancomycin given the high negative predictive value for MRSA pneumonia associated with such as result.  Of note, in the absence of leukocytosis and in the absence of fever, SIRS criteria not met for sepsis at this time.  Lactic acid x 2 were nonelevated, as further quantified above.  Blood cultures x 2 were collected in the ED today followed by initiation  of IV vancomycin and cefepime.  Plan: Follow-up results of blood cultures x 2.  Continue IV vancomycin and cefepime, as above.  Check MRSA PCR.  Add on procalcitonin level.  Check strep pneumonia urine antigen.  Flutter valve, incentive spirometry.  Prn Tessalon Perles.  Repeat CMP, CBC in the morning.  Further evaluation and management of consequential acute COPD exacerbation, as above.              #) Acute hypoxic respiratory distress: in the context of acute respiratory symptoms and no known baseline supplemental O2 requirements, presenting O2 sat note to be in the low 80s, Sosan improving into the mid 90s on 3 L nasal cannula, thereby meeting criteria for acute hypoxic respiratory distress as opposed to acute hypoxic respiratory failure at this time. Appears to be on basis of acute COPD exacerbation as well as suspicion for HCAP pneumonia in the setting of left lower lobe infiltrate identified on today CTA chest, as  further detailed above.   As noted above, CTA chest shows no evidence of acute pulmonary embolism nor any evidence of pneumothorax.  No clinical or radiographic evidence to suggest acutely decompensated heart failure and ACS is felt to be less likely, as further detailed above.  Plan: further evaluation/management of presenting acute COPD exacerbation as well as suspected HCAP pneumonia, as above. Monitor continuous pulse ox with prn supplemental O2 to maintain O2 sats greater than or equal to 92%. monitor on telemetry. CMP/CBC in the AM. Check serum Mg and Phos levels. Check blood gas. Flutter valve, incentive spirometry.  Add on procalcitonin level.  Solumedrol, scheduled duo nebulizers, prn albuterol nebulizers.                #) Essential Hypertension: documented h/o such, with outpatient antihypertensive regimen including valsartan as well as chlorthalidone.  SBP's in the ED today: 130s to 160s mmHg. however, in the setting of concern for underlying  infectious process, will be conservative with resumption of outpatient hypertensive medications, as further qualified below.  Plan: Close monitoring of subsequent BP via routine VS. hold home chlorthalidone for now.  Resume home valsartan.              #) Hyperlipidemia: documented h/o such. On atorvastatin as outpatient.   Plan: continue home statin.               #) Chronic tobacco abuse: Patient conveys that they are a current smoker, having smoked 1 ppd for approximately 45 years.  Patient has recently reduced his cigarette smoking relative to this baseline, but does continue to smoke.  Plan: Counseled the patient for less than 2 minutes on the importance of complete smoking discontinuation.  Order placed for prn nicotine patch for use during this hospitalization.     DVT prophylaxis: SCD's   Code Status: Full code Family Communication: none Disposition Plan: Per Rounding Team Consults called: none;  Admission status: Inpatient    I SPENT GREATER THAN 75  MINUTES IN CLINICAL CARE TIME/MEDICAL DECISION-MAKING IN COMPLETING THIS ADMISSION.     Chaney Born Bearl Talarico DO [facility] From 7PM - 7AM   05/21/2022, 11:11 PM

## 2022-05-21 NOTE — ED Provider Notes (Signed)
[place] EMERGENCY DEPARTMENT AT [facility] Provider Note   CSN: [id number] Arrival date & time: 05/21/22  4098     History  Chief Complaint  Patient presents with   Shortness of Breath    [name] is a 67 y.o. male history of COPD, recent discharge from the hospital for COPD exacerbation versus pneumonia here for evaluation of progressive shortness of breath.  He does not wear chronic home oxygen.  Has increased cough, pleuritic chest pain as well as shortness of breath.  When he arrived here the emergency department he was tachycardic, tachypneic and hypoxic.  He notes increased lower extremity swelling from baseline since he was hospitalized.  Denies prior history of CHF.  Does have some edema and orthopnea however echo 1 week ago with EF 60 to 65%.  Productive of green sputum.  No exertional chest pain.  Feels generally weak.  Has been eating and drinking without difficulty  HPI     Home Medications Prior to Admission medications   Medication Sig Start Date End Date Taking? Authorizing Provider  albuterol (VENTOLIN HFA) 108 (90 Base) MCG/ACT inhaler Inhale 2 puffs into the lungs every 6 (six) hours as needed for wheezing or shortness of breath. 05/13/22   Osvaldo Shipper, MD  atorvastatin (LIPITOR) 20 MG tablet Take 1 tablet (20 mg total) by mouth daily. 07/23/21   Hoy Register, MD  Budeson-Glycopyrrol-Formoterol (BREZTRI AEROSPHERE) 160-9-4.8 MCG/ACT AERO Inhale 2 puffs into the lungs 2 (two) times daily. 10/16/20   Hoy Register, MD  chlorthalidone (HYGROTON) 25 MG tablet Take 1 tablet (25 mg total) by mouth daily. 01/30/22   Newlin, Odette Horns, MD  ipratropium-albuterol (DUONEB) 0.5-2.5 (3) MG/3ML SOLN TAKE 3 MLS BY NEBULIZATION EVERY 6 (SIX) HOURS AS NEEDED (SEVERE WHEEZING). Patient taking differently: Take 3 mLs by nebulization every 6 (six) hours as needed (severe wheezing). 01/30/22   Hoy Register, MD  Misc. Devices MISC Nebulizer device.  Diagnosis  COPD 05/15/22   Hoy Register, MD  predniSONE (DELTASONE) 20 MG tablet Take 3 tablets once daily for 3 days followed by 2 tablets once daily for 3 days followed by 1 tablet once daily for 3 days and then stop 05/13/22   Osvaldo Shipper, MD  valsartan (DIOVAN) 160 MG tablet Take 1 tablet (160 mg total) by mouth daily. 07/05/21   Parke Poisson, MD      Allergies    Codeine and Penicillins    Review of Systems   Review of Systems  Constitutional: Negative.   HENT: Negative.    Respiratory:  Positive for cough, chest tightness, shortness of breath and wheezing.   Cardiovascular:  Positive for leg swelling. Negative for chest pain and palpitations.  Gastrointestinal: Negative.   Genitourinary: Negative.   Musculoskeletal: Negative.   Skin: Negative.   Neurological: Negative.   All other systems reviewed and are negative.   Physical Exam Updated Vital Signs BP 138/74   Pulse 88   Temp 99.1 F (37.3 C) (Oral)   Resp (!) 21   Ht 5\' 9"  (1.753 m)   Wt 109 kg   SpO2 95%   BMI 35.49 kg/m  Physical Exam Vitals and nursing note reviewed.  Constitutional:      General: He is not in acute distress.    Appearance: He is well-developed. He is obese. He is not ill-appearing, toxic-appearing or diaphoretic.  HENT:     Head: Normocephalic and atraumatic.  Eyes:     Pupils: Pupils are equal, round, and  reactive to light.  Cardiovascular:     Rate and Rhythm: Regular rhythm. Tachycardia present.     Pulses:          Radial pulses are 2+ on the right side and 2+ on the left side.       Dorsalis pedis pulses are 1+ on the right side and 1+ on the left side.  Pulmonary:     Effort: Tachypnea and respiratory distress present.     Breath sounds: Decreased breath sounds, wheezing and rhonchi present.  Chest:     Comments: nontender chest wall Abdominal:     General: Bowel sounds are normal. There is no distension.     Palpations: Abdomen is soft.     Comments: Soft, nontender   Musculoskeletal:        General: Normal range of motion.     Cervical back: Normal range of motion and neck supple.     Right lower leg: No tenderness. Edema present.     Left lower leg: No tenderness. Edema present.     Comments: 2+ pitting edema to knees bilaterally.  No Bony tenderness, compartments soft, nontender calves bilaterally  Skin:    General: Skin is warm and dry.     Comments: No erythema, warmth  Neurological:     General: No focal deficit present.     Mental Status: He is alert and oriented to person, place, and time.     ED Results / Procedures / Treatments   Labs (all labs ordered are listed, but only abnormal results are displayed) Labs Reviewed  CBC WITH DIFFERENTIAL/PLATELET - Abnormal; Notable for the following components:      Result Value   Monocytes Absolute 1.2 (*)    All other components within normal limits  BASIC METABOLIC PANEL - Abnormal; Notable for the following components:   CO2 34 (*)    Glucose, Bld 106 (*)    Calcium 8.5 (*)    All other components within normal limits  CULTURE, BLOOD (ROUTINE X 2)  CULTURE, BLOOD (ROUTINE X 2)  MRSA NEXT GEN BY PCR, NASAL  BRAIN NATRIURETIC PEPTIDE  D-DIMER, QUANTITATIVE  LACTIC ACID, PLASMA  LACTIC ACID, PLASMA  CBC WITH DIFFERENTIAL/PLATELET  COMPREHENSIVE METABOLIC PANEL  MAGNESIUM  MAGNESIUM  PHOSPHORUS  BLOOD GAS, VENOUS  TROPONIN I (HIGH SENSITIVITY)  TROPONIN I (HIGH SENSITIVITY)    EKG EKG Interpretation  Date/Time:  Tuesday May 21 2022 19:31:54 EDT Ventricular Rate:  100 PR Interval:  116 QRS Duration: 83 QT Interval:  340 QTC Calculation: 439 R Axis:   84 Text Interpretation: Sinus tachycardia Borderline right axis deviation No acute changes No significant change since last tracing Confirmed by Derwood Kaplan ([id number]) on 05/21/2022 9:39:33 PM  Radiology CT Angio Chest PE W and/or Wo Contrast  Result Date: 05/21/2022 CLINICAL DATA:  Shortness of breath and hypoxia. Ankle  swelling. PE suspected. EXAM: CT ANGIOGRAPHY CHEST WITH CONTRAST TECHNIQUE: Multidetector CT imaging of the chest was performed using the standard protocol during bolus administration of intravenous contrast. Multiplanar CT image reconstructions and MIPs were obtained to evaluate the vascular anatomy. RADIATION DOSE REDUCTION: This exam was performed according to the departmental dose-optimization program which includes automated exposure control, adjustment of the mA and/or kV according to patient size and/or use of iterative reconstruction technique. CONTRAST:  OMNIPAQUE IOHEXOL 300 MG/ML  SOLN COMPARISON:  Chest radiographs 05/21/2022; CT 07/27/2021 and CT 06/25/2021 FINDINGS: Cardiovascular: Satisfactory opacification of the pulmonary arteries to the segmental level. No pulmonary  embolism. Normal heart size. Coronary artery and aortic atherosclerotic calcification. No pericardial effusion. Mediastinum/Nodes: Unremarkable esophagus.  No thoracic adenopathy. Lungs/Pleura: Mild emphysema in the upper lungs. Diffuse bronchial wall thickening with mucous plugging greatest in the lower lobes. Bilateral centrilobular micro nodules and tree-in-bud opacities greatest in the lower lobes. Subpleural focal consolidation in the medial left lower lobe. No pleural effusion or pneumothorax. The Upper Abdomen: No acute abnormality. Musculoskeletal: Thoracic spondylosis with bridging anterior osteophytes. No acute fracture. Review of the MIP images confirms the above findings. IMPRESSION: 1. No evidence of acute pulmonary embolism. 2. Findings compatible with small airway infection/inflammation possible developing pneumonia in the left lower lobe. Follow-up after treatment is recommended to ensure resolution. Aortic Atherosclerosis (ICD10-I70.0) and Emphysema (ICD10-J43.9). Electronically Signed   By: Minerva Fester M.D.   On: 05/21/2022 21:28   DG Chest 2 View  Result Date: 05/21/2022 CLINICAL DATA:  Shortness of  breath. EXAM: CHEST - 2 VIEW COMPARISON:  May 11, 2022. FINDINGS: The heart size and mediastinal contours are within normal limits. Both lungs are clear. The visualized skeletal structures are unremarkable. IMPRESSION: No active cardiopulmonary disease. Electronically Signed   By: Lupita Raider M.D.   On: 05/21/2022 20:18    Procedures .Critical Care  Performed by: Linwood Dibbles, PA-C Authorized by: Linwood Dibbles, PA-C   Critical care provider statement:    Critical care time (minutes):  76   Critical care was necessary to treat or prevent imminent or life-threatening deterioration of the following conditions:  Respiratory failure   Critical care was time spent personally by me on the following activities:  Development of treatment plan with patient or surrogate, discussions with consultants, evaluation of patient's response to treatment, examination of patient, ordering and review of laboratory studies, ordering and review of radiographic studies, ordering and performing treatments and interventions, pulse oximetry, re-evaluation of patient's condition and review of old charts     Medications Ordered in ED Medications  vancomycin (VANCOREADY) IVPB 2000 mg/400 mL (has no administration in time range)  acetaminophen (TYLENOL) tablet 650 mg (has no administration in time range)    Or  acetaminophen (TYLENOL) suppository 650 mg (has no administration in time range)  melatonin tablet 3 mg (has no administration in time range)  ondansetron (ZOFRAN) injection 4 mg (has no administration in time range)  ipratropium-albuterol (DUONEB) 0.5-2.5 (3) MG/3ML nebulizer solution 3 mL (has no administration in time range)  albuterol (PROVENTIL) (2.5 MG/3ML) 0.083% nebulizer solution 2.5 mg (has no administration in time range)  methylPREDNISolone sodium succinate (SOLU-MEDROL) 125 mg/2 mL injection 80 mg (has no administration in time range)  ceFEPIme (MAXIPIME) 2 g in sodium chloride 0.9 %  100 mL IVPB (has no administration in time range)  vancomycin (VANCOREADY) IVPB 1750 mg/350 mL (has no administration in time range)  ipratropium-albuterol (DUONEB) 0.5-2.5 (3) MG/3ML nebulizer solution 3 mL (3 mLs Nebulization Given 05/21/22 1945)  methylPREDNISolone sodium succinate (SOLU-MEDROL) 125 mg/2 mL injection 125 mg (125 mg Intravenous Given 05/21/22 1945)  ipratropium-albuterol (DUONEB) 0.5-2.5 (3) MG/3ML nebulizer solution 3 mL (3 mLs Nebulization Given 05/21/22 2054)  magnesium sulfate IVPB 2 g 50 mL (0 g Intravenous Stopped 05/21/22 2154)  iohexol (OMNIPAQUE) 300 MG/ML solution 100 mL (100 mLs Intravenous Contrast Given 05/21/22 2119)  ceFEPIme (MAXIPIME) 2 g in sodium chloride 0.9 % 100 mL IVPB (2 g Intravenous New Bag/Given 05/21/22 2253)    ED Course/ Medical Decision Making/ A&P   67 year old recent discharge for COPD exacerbation versus pneumonia  here for evaluation of worsening cough, shortness of breath.  Noted to have some pleuritic chest pain.  He is some chronic lower extremity swelling which he states has been worse since being discharged from the hospital, no history of CHF I reviewed his echo from approximately 1 week ago which shows EF 60 to 65%.  On arrival he is tachycardic, tachypneic and hypoxic.  He has diffuse wheezing and rhonchi throughout.  Some mild respiratory distress.  Patient immediately brought back to her room.  On breathing treatment, labs and imaging and reassess  Labs and imaging personally viewed and interpreted:  CBC without leukocytosis Metabolic panel glucose 106 D-dimer negative Troponin delta flat BNP 23 Lactic 0.8 X-ray without pneumonia, pneumothorax, infiltrates CTA with possible developing pneumonia EKG without ischemic changes  Patient reassessed.  Still has continued wheeze.  He was hypoxic down to 89% on 2 L, I bumped him up to 3 L via nasal cannula.  Will place orders for magnesium and additional nebulizer  Patient reassessed.  I discussed  his labs and imaging.  Will treat for HCAP given his recent admission.  Will need to be admitted for hypoxic respiratory failure likely secondary to COPD exacerbation versus helping PNA  Patient and family agreeable  CONSULT with Dr.Howerter with [facility] who is agreeable to evaluate patient for admission   The patient appears reasonably stabilized for admission considering the current resources, flow, and capabilities available in the ED at this time, and I doubt any other [facility] requiring further screening and/or treatment in the ED prior to admission.                            Medical Decision Making Amount and/or Complexity of Data Reviewed Independent Historian: friend External Data Reviewed: labs, radiology, ECG and notes. Labs: ordered. Decision-making details documented in ED Course. Radiology: ordered and independent interpretation performed. Decision-making details documented in ED Course. ECG/medicine tests: ordered and independent interpretation performed. Decision-making details documented in ED Course.  Risk OTC drugs. Prescription drug management. Parenteral controlled substances. Decision regarding hospitalization.           Final Clinical Impression(s) / ED Diagnoses Final diagnoses:  COPD exacerbation (HCC)  Pneumonia of both lungs due to infectious organism, unspecified part of lung  Acute respiratory failure with hypoxia [employer] [employer])    Rx / DC Orders ED Discharge Orders     None         Russel Morain A, PA-C 05/21/22 2333    Derwood Kaplan, MD 05/24/22 1403

## 2022-05-21 NOTE — ED Triage Notes (Signed)
Patient has been feeling short of breath x1 week along with bilateral ankle swelling. Unable to catch a breath while walking.

## 2022-05-21 NOTE — Progress Notes (Signed)
Pharmacy Antibiotic Note  Maurice Morgenroth is a 67 y.o. male admitted on 05/21/2022 with pneumonia.  Pharmacy has been consulted for Vancomycin & Cefepime dosing.  Plan: Cefepime 2gm IV q8h Vancomycin 1750mg  IV q24h to target AUC 400-550.  Estimated AUC on this regimen 490. Check MRSA PCR Monitor renal function and cx data   Height: 5\' 9"  (175.3 cm) Weight: 109 kg (240 lb 4.8 oz) IBW/kg (Calculated) : 70.7  Temp (24hrs), Avg:98.4 F (36.9 C), Min:98.4 F (36.9 C), Max:98.4 F (36.9 C)  Recent Labs  Lab 05/21/22 1939 05/21/22 1948 05/21/22 2248  WBC 9.8  --   --   CREATININE 1.00  --   --   LATICACIDVEN  --  0.8 0.6    Estimated Creatinine Clearance: 89.6 mL/min (by C-G formula based on SCr of 1 mg/dL).    Allergies  Allergen Reactions   Codeine Itching   Penicillins Itching and Other (See Comments)    Antimicrobials this admission: 5/7 Cefepime >>  5/7 Vancomycin >>   Dose adjustments this admission:  Microbiology results: 5/7 BCx:  MRSA PCR:   Thank you for allowing pharmacy to be a part of this patient's care.  Junita Push PharmD 05/21/2022 11:17 PM

## 2022-05-22 ENCOUNTER — Encounter (HOSPITAL_COMMUNITY): Payer: Self-pay | Admitting: Internal Medicine

## 2022-05-22 DIAGNOSIS — E785 Hyperlipidemia, unspecified: Secondary | ICD-10-CM | POA: Insufficient documentation

## 2022-05-22 DIAGNOSIS — J441 Chronic obstructive pulmonary disease with (acute) exacerbation: Secondary | ICD-10-CM | POA: Diagnosis not present

## 2022-05-22 HISTORY — DX: Hyperlipidemia, unspecified: E78.5

## 2022-05-22 LAB — CBC WITH DIFFERENTIAL/PLATELET
Abs Immature Granulocytes: 0.06 10*3/uL (ref 0.00–0.07)
Basophils Absolute: 0 10*3/uL (ref 0.0–0.1)
Basophils Relative: 0 %
Eosinophils Absolute: 0 10*3/uL (ref 0.0–0.5)
Eosinophils Relative: 0 %
HCT: 42.8 % (ref 39.0–52.0)
Hemoglobin: 13.9 g/dL (ref 13.0–17.0)
Immature Granulocytes: 1 %
Lymphocytes Relative: 4 %
Lymphs Abs: 0.3 10*3/uL — ABNORMAL LOW (ref 0.7–4.0)
MCH: 32.1 pg (ref 26.0–34.0)
MCHC: 32.5 g/dL (ref 30.0–36.0)
MCV: 98.8 fL (ref 80.0–100.0)
Monocytes Absolute: 0.1 10*3/uL (ref 0.1–1.0)
Monocytes Relative: 2 %
Neutro Abs: 7 10*3/uL (ref 1.7–7.7)
Neutrophils Relative %: 93 %
Platelets: 158 10*3/uL (ref 150–400)
RBC: 4.33 MIL/uL (ref 4.22–5.81)
RDW: 14.5 % (ref 11.5–15.5)
WBC: 7.5 10*3/uL (ref 4.0–10.5)
nRBC: 0 % (ref 0.0–0.2)

## 2022-05-22 LAB — BLOOD GAS, VENOUS
Acid-Base Excess: 9.7 mmol/L — ABNORMAL HIGH (ref 0.0–2.0)
Bicarbonate: 37.9 mmol/L — ABNORMAL HIGH (ref 20.0–28.0)
O2 Saturation: 96.2 %
Patient temperature: 37
pCO2, Ven: 67 mmHg — ABNORMAL HIGH (ref 44–60)
pH, Ven: 7.36 (ref 7.25–7.43)
pO2, Ven: 75 mmHg — ABNORMAL HIGH (ref 32–45)

## 2022-05-22 LAB — COMPREHENSIVE METABOLIC PANEL
ALT: 33 U/L (ref 0–44)
AST: 21 U/L (ref 15–41)
Albumin: 3.2 g/dL — ABNORMAL LOW (ref 3.5–5.0)
Alkaline Phosphatase: 70 U/L (ref 38–126)
Anion gap: 4 — ABNORMAL LOW (ref 5–15)
BUN: 15 mg/dL (ref 8–23)
CO2: 32 mmol/L (ref 22–32)
Calcium: 7.9 mg/dL — ABNORMAL LOW (ref 8.9–10.3)
Chloride: 101 mmol/L (ref 98–111)
Creatinine, Ser: 0.81 mg/dL (ref 0.61–1.24)
GFR, Estimated: >60 mL/min (ref >60)
Glucose, Bld: 182 mg/dL — ABNORMAL HIGH (ref 70–99)
Potassium: 4.1 mmol/L (ref 3.5–5.1)
Sodium: 137 mmol/L (ref 135–145)
Total Bilirubin: 0.7 mg/dL (ref 0.3–1.2)
Total Protein: 5.8 g/dL — ABNORMAL LOW (ref 6.5–8.1)

## 2022-05-22 LAB — MAGNESIUM
Magnesium: 2.3 mg/dL (ref 1.7–2.4)
Magnesium: 2.5 mg/dL — ABNORMAL HIGH (ref 1.7–2.4)

## 2022-05-22 LAB — STREP PNEUMONIAE URINARY ANTIGEN: Strep Pneumo Urinary Antigen: NEGATIVE

## 2022-05-22 LAB — MRSA NEXT GEN BY PCR, NASAL: MRSA by PCR Next Gen: NOT DETECTED

## 2022-05-22 LAB — PROCALCITONIN: Procalcitonin: <0.1 ng/mL

## 2022-05-22 LAB — PHOSPHORUS: Phosphorus: 4 mg/dL (ref 2.5–4.6)

## 2022-05-22 MED ORDER — BENZONATATE 100 MG PO CAPS
200.0000 mg | ORAL_CAPSULE | Freq: Three times a day (TID) | ORAL | Status: DC | PRN
  Administered 2022-05-23 – 2022-05-24 (×2): 200 mg via ORAL
  Filled 2022-05-22 (×2): qty 2

## 2022-05-22 MED ORDER — IPRATROPIUM-ALBUTEROL 0.5-2.5 (3) MG/3ML IN SOLN
3.0000 mL | Freq: Two times a day (BID) | RESPIRATORY_TRACT | Status: DC
  Administered 2022-05-22 – 2022-05-24 (×4): 3 mL via RESPIRATORY_TRACT
  Filled 2022-05-22 (×4): qty 3

## 2022-05-22 MED ORDER — IRBESARTAN 75 MG PO TABS
37.5000 mg | ORAL_TABLET | Freq: Every day | ORAL | Status: DC
  Administered 2022-05-22 – 2022-05-24 (×3): 37.5 mg via ORAL
  Filled 2022-05-22: qty 1
  Filled 2022-05-22: qty 0.5
  Filled 2022-05-22: qty 1

## 2022-05-22 MED ORDER — BUDESONIDE 0.5 MG/2ML IN SUSP: 0.5000 mg | Freq: Two times a day (BID) | RESPIRATORY_TRACT | Status: DC

## 2022-05-22 MED ORDER — NICOTINE 14 MG/24HR TD PT24: 14.0000 mg | MEDICATED_PATCH | Freq: Every day | TRANSDERMAL | Status: DC | PRN

## 2022-05-22 MED ORDER — ARFORMOTEROL TARTRATE 15 MCG/2ML IN NEBU
15.0000 ug | INHALATION_SOLUTION | Freq: Two times a day (BID) | RESPIRATORY_TRACT | Status: DC
  Administered 2022-05-22 – 2022-05-23 (×2): 15 ug via RESPIRATORY_TRACT
  Filled 2022-05-22 (×4): qty 2

## 2022-05-22 MED ORDER — BUDESONIDE 0.5 MG/2ML IN SUSP
0.5000 mg | Freq: Two times a day (BID) | RESPIRATORY_TRACT | Status: DC
  Administered 2022-05-22 – 2022-05-24 (×4): 0.5 mg via RESPIRATORY_TRACT
  Filled 2022-05-22 (×4): qty 2

## 2022-05-22 MED ORDER — ARFORMOTEROL TARTRATE 15 MCG/2ML IN NEBU: 15.0000 ug | INHALATION_SOLUTION | Freq: Two times a day (BID) | RESPIRATORY_TRACT | Status: DC

## 2022-05-22 MED ORDER — FUROSEMIDE 10 MG/ML IJ SOLN
40.0000 mg | Freq: Every day | INTRAMUSCULAR | Status: DC
  Administered 2022-05-22 – 2022-05-24 (×3): 40 mg via INTRAVENOUS
  Filled 2022-05-22 (×3): qty 4

## 2022-05-22 MED ORDER — ATORVASTATIN CALCIUM 20 MG PO TABS
20.0000 mg | ORAL_TABLET | Freq: Every day | ORAL | Status: DC
  Administered 2022-05-22 – 2022-05-24 (×3): 20 mg via ORAL
  Filled 2022-05-22: qty 1
  Filled 2022-05-22: qty 2
  Filled 2022-05-22: qty 1

## 2022-05-22 NOTE — ED Notes (Signed)
Pt provided with cup of ice per RN and request.

## 2022-05-22 NOTE — ED Notes (Signed)
Patient ambulated to the bathroom. Patient is now resting in bed. Continued on oxygen.

## 2022-05-22 NOTE — Progress Notes (Signed)
PROGRESS NOTE    [name]  ZOX:[id number] DOB: [birth year] DOA: 05/21/2022 PCP: Hoy Register, MD    Brief Narrative:   [name] is a 67 y.o. male with past medical history significant for COPD, HTN, HLD who presented to [facility] ED on 5/7 with complaint of progressive shortness of breath over the last week.  Additionally reports productive cough of green sputum.  Recently hospitalized 4/26 - 4/29 for acute COPD exacerbation.  Reports good compliance with his home medications and prednisone taper.  Denies chest pain, no palpitations, no nausea/vomiting, no dizziness, no fever, no headache, no urinary issues.  In the ED, temperature 98.4 F, HR 114, RR 22, BP 173/98, SpO2 83% on room air.  WBC 9.8, hemoglobin 14.8, platelets 164.  Sodium 139, potassium 3.7, chloride 99, CO2 34, glucose 106, BUN 12, creatinine 1.00.  High sensitive troponin 11.  BNP 23.0.  Lactic acid 0.8.  Procalcitonin less than 0.10.  Chest x-ray with no active cardiopulmonary disease process.  CT angiogram chest with no evidence of acute pulmonary embolism, findings compatible with small airway infection/inflammation with possible developing pneumonia left lower lobe.  Patient was started on IV vancomycin, cefepime, IV steroids, scheduled DuoNebs and albuterol.  TRH consulted for admission for further evaluation management of acute hypoxic respite failure secondary to acute COPD exacerbation combined with community-acquired pneumonia.  Assessment & Plan:   Acute hypoxic respiratory failure, POA COPD exacerbation Patient presenting to ED with progressive shortness of breath over the last week despite recent hospitalization for COPD exacerbation and compliance with his home medications.  Patient was noted to be hypoxic with SpO2 83% on room air as arrival.  Patient is afebrile without leukocytosis. -- Pulmicort neb twice daily -- Brovana neb twice daily --Solu-Medrol 80 mg IV every 12 hours --  DuoNeb scheduled twice daily -- Albuterol neb every 4 hours as needed wheezing/shortness of breath -- Continue supplemental oxygen, maintain SpO2 greater than 88% -- Plan outpatient referral to pulmonology for further management at time of discharge  Community-acquired pneumonia Patient reports productive cough with green sputum.  Patient is afebrile without leukocytosis.  CT angiogram chest noted for developing infectious process left lower lobe.  MRSA PCR negative, strep pneumo urinary antigen negative. -- Continue cefepime 2 g IV every 8 hours  Essential hypertension -- Irbesartan 37.5 mg p.o. daily  Hyperlipidemia -- Atorvastatin 20 mg p.o. daily  Tobacco use disorder Counseled on need for complete cessation/abstinence -- Nicotine patch  Obesity Body mass index is 35.49 kg/m.  Discussed with patient needs for aggressive lifestyle changes/weight loss as this complicates all facets of care.  Outpatient follow-up with PCP.     DVT prophylaxis: SCDs Start: 05/21/22 2306    Code Status: Full Code Family Communication: No family present at bedside this morning  Disposition Plan:  Level of care: Telemetry Status is: Inpatient Remains inpatient appropriate because: IV steroids, IV antibiotics, scheduled neb treatments, needs weaning of oxygen before ready for discharge home, anticipate 2-3 days    Consultants:  None  Procedures:  None  Antimicrobials:  Vancomycin 5/7 - 5/7 Cefepime 5/7>>   Subjective: Patient seen examined bedside, resting comfortably.  Continues with productive cough, shortness of breath and wheezing; although slightly improved since presentation to the ED.  Patient desiring referral to pulmonology outpatient for management of his COPD when he is ready for discharge.  No other specific questions or concerns at this time.  Denies headache, no dizziness, no chest pain, no palpitations,  no fever/chills/night sweats, no nausea/vomiting/diarrhea, no focal  weakness, no fatigue, no paresthesias.  No acute events overnight per nurse staff.  Objective: Vitals:   05/22/22 1304 05/22/22 1348 05/22/22 1400 05/22/22 1517  BP: 137/76 (!) 142/77 127/76 134/83  Pulse: 98 96 92 85  Resp: (!) 21 19 20 19   Temp:  97.7 F (36.5 C)  98.1 F (36.7 C)  TempSrc:  Oral  Oral  SpO2: 94% 93% 94% 94%  Weight:      Height:        Intake/Output Summary (Last 24 hours) at 05/22/2022 1631 Last data filed at 05/22/2022 1500 Gross per 24 hour  Intake 238.61 ml  Output --  Net 238.61 ml   Filed Weights   05/21/22 1919  Weight: 109 kg    Examination:  Physical Exam: GEN: NAD, alert and oriented x 3, obese HEENT: NCAT, PERRL, EOMI, sclera clear, MMM PULM: Breath sounds slight diminished bilateral bases with diffuse wheezing throughout all lung fields, normal respiratory effort without accessory muscle use, on 2 L nasal cannula with SpO2 93% at rest. CV: RRR w/o M/G/R GI: abd soft, NTND, NABS, no R/G/M MSK: no peripheral edema, muscle strength globally intact 5/5 bilateral upper/lower extremities NEURO: CN II-XII intact, no focal deficits, sensation to light touch intact PSYCH: normal mood/affect Integumentary: dry/intact, no rashes or wounds    Data Reviewed: I have personally reviewed following labs and imaging studies  CBC: Recent Labs  Lab 05/21/22 1939 05/22/22 0437  WBC 9.8 7.5  NEUTROABS 6.7 7.0  HGB 14.8 13.9  HCT 44.7 42.8  MCV 97.6 98.8  PLT 164 158   Basic Metabolic Panel: Recent Labs  Lab 05/21/22 1939 05/21/22 2248 05/22/22 0437  NA 139  --  137  K 3.7  --  4.1  CL 99  --  101  CO2 34*  --  32  GLUCOSE 106*  --  182*  BUN 12  --  15  CREATININE 1.00  --  0.81  CALCIUM 8.5*  --  7.9*  MG  --  2.3 2.5*  PHOS  --   --  4.0   GFR: Estimated Creatinine Clearance: 110.6 mL/min (by C-G formula based on SCr of 0.81 mg/dL). Liver Function Tests: Recent Labs  Lab 05/22/22 0437  AST 21  ALT 33  ALKPHOS 70  BILITOT 0.7   PROT 5.8*  ALBUMIN 3.2*   No results for input(s): "LIPASE", "AMYLASE" in the last 168 hours. No results for input(s): "AMMONIA" in the last 168 hours. Coagulation Profile: No results for input(s): "INR", "PROTIME" in the last 168 hours. Cardiac Enzymes: No results for input(s): "CKTOTAL", "CKMB", "CKMBINDEX", "TROPONINI" in the last 168 hours. BNP (last 3 results) No results for input(s): "PROBNP" in the last 8760 hours. HbA1C: No results for input(s): "HGBA1C" in the last 72 hours. CBG: No results for input(s): "GLUCAP" in the last 168 hours. Lipid Profile: No results for input(s): "CHOL", "HDL", "LDLCALC", "TRIG", "CHOLHDL", "LDLDIRECT" in the last 72 hours. Thyroid Function Tests: No results for input(s): "TSH", "T4TOTAL", "FREET4", "T3FREE", "THYROIDAB" in the last 72 hours. Anemia Panel: No results for input(s): "VITAMINB12", "FOLATE", "FERRITIN", "TIBC", "IRON", "RETICCTPCT" in the last 72 hours. Sepsis Labs: Recent Labs  Lab 05/21/22 1948 05/21/22 2248  PROCALCITON  --  <0.10  LATICACIDVEN 0.8 0.6    Recent Results (from the past 240 hour(s))  Blood culture (routine x 2)     Status: None (Preliminary result)   Collection Time: 05/21/22  7:48  PM   Specimen: BLOOD  Result Value Ref Range Status   Specimen Description   Final    BLOOD RIGHT ANTECUBITAL Performed at [facility], [address], [place], [place] [address]    Special Requests   Final    BOTTLES DRAWN AEROBIC AND ANAEROBIC Blood Culture results may not be optimal due to an excessive volume of blood received in culture bottles Performed at [facility], [address] [place], [place] [address]    Culture   Final    NO GROWTH < 12 HOURS Performed at [facility], [address], [place], [place] [address]    Report Status PENDING  Incomplete  MRSA Next Gen by PCR, Nasal     Status: None   Collection Time: 05/22/22  7:53 AM  Result Value Ref Range Status    MRSA by PCR Next Gen NOT DETECTED NOT DETECTED Final    Comment: (NOTE) The GeneXpert MRSA Assay (FDA approved for NASAL specimens only), is one component of a comprehensive MRSA colonization surveillance program. It is not intended to diagnose MRSA infection nor to guide or monitor treatment for MRSA infections. Test performance is not FDA approved in patients less than 81 years old. Performed at [facility], [address] [place], [place] [address]          Radiology Studies: CT Angio Chest PE W and/or Wo Contrast  Result Date: 05/21/2022 CLINICAL DATA:  Shortness of breath and hypoxia. Ankle swelling. PE suspected. EXAM: CT ANGIOGRAPHY CHEST WITH CONTRAST TECHNIQUE: Multidetector CT imaging of the chest was performed using the standard protocol during bolus administration of intravenous contrast. Multiplanar CT image reconstructions and MIPs were obtained to evaluate the vascular anatomy. RADIATION DOSE REDUCTION: This exam was performed according to the departmental dose-optimization program which includes automated exposure control, adjustment of the mA and/or kV according to patient size and/or use of iterative reconstruction technique. CONTRAST:  OMNIPAQUE IOHEXOL 300 MG/ML  SOLN COMPARISON:  Chest radiographs 05/21/2022; CT 07/27/2021 and CT 06/25/2021 FINDINGS: Cardiovascular: Satisfactory opacification of the pulmonary arteries to the segmental level. No pulmonary embolism. Normal heart size. Coronary artery and aortic atherosclerotic calcification. No pericardial effusion. Mediastinum/Nodes: Unremarkable esophagus.  No thoracic adenopathy. Lungs/Pleura: Mild emphysema in the upper lungs. Diffuse bronchial wall thickening with mucous plugging greatest in the lower lobes. Bilateral centrilobular micro nodules and tree-in-bud opacities greatest in the lower lobes. Subpleural focal consolidation in the medial left lower lobe. No pleural effusion or  pneumothorax. The Upper Abdomen: No acute abnormality. Musculoskeletal: Thoracic spondylosis with bridging anterior osteophytes. No acute fracture. Review of the MIP images confirms the above findings. IMPRESSION: 1. No evidence of acute pulmonary embolism. 2. Findings compatible with small airway infection/inflammation possible developing pneumonia in the left lower lobe. Follow-up after treatment is recommended to ensure resolution. Aortic Atherosclerosis (ICD10-I70.0) and Emphysema (ICD10-J43.9). Electronically Signed   By: Minerva Fester M.D.   On: 05/21/2022 21:28   DG Chest 2 View  Result Date: 05/21/2022 CLINICAL DATA:  Shortness of breath. EXAM: CHEST - 2 VIEW COMPARISON:  May 11, 2022. FINDINGS: The heart size and mediastinal contours are within normal limits. Both lungs are clear. The visualized skeletal structures are unremarkable. IMPRESSION: No active cardiopulmonary disease. Electronically Signed   By: Lupita Raider M.D.   On: 05/21/2022 20:18        Scheduled Meds:  arformoterol  15 mcg Nebulization BID   atorvastatin  20 mg Oral  Daily   budesonide (PULMICORT) nebulizer solution  0.5 mg Nebulization BID   furosemide  40 mg Intravenous Daily   ipratropium-albuterol  3 mL Nebulization BID   irbesartan  37.5 mg Oral Daily   methylPREDNISolone (SOLU-MEDROL) injection  80 mg Intravenous Q12H   Continuous Infusions:  ceFEPime (MAXIPIME) IV Stopped (05/22/22 1440)     LOS: 1 day    Time spent: 52 minutes spent on chart review, discussion with nursing staff, consultants, updating family and interview/physical exam; more than 50% of that time was spent in counseling and/or coordination of care.    Alvira Philips [place], DO [facility] Available via Epic secure chat 7am-7pm After these hours, please refer to coverage provider listed on [email or web address] 05/22/2022, 4:31 PM

## 2022-05-22 NOTE — ED Notes (Signed)
Pt Oxygen at 86-89%. Pt found without nasal canula. RN made aware, pt placed on 2L of oxygen nasal canula. Oxygen at 94% at this time.

## 2022-05-23 DIAGNOSIS — J441 Chronic obstructive pulmonary disease with (acute) exacerbation: Secondary | ICD-10-CM | POA: Diagnosis not present

## 2022-05-23 LAB — CULTURE, BLOOD (ROUTINE X 2)

## 2022-05-23 MED ORDER — METHYLPREDNISOLONE SODIUM SUCC 40 MG IJ SOLR
40.0000 mg | Freq: Two times a day (BID) | INTRAMUSCULAR | Status: DC
  Administered 2022-05-23 – 2022-05-24 (×2): 40 mg via INTRAVENOUS
  Filled 2022-05-23 (×2): qty 1

## 2022-05-23 MED ORDER — GUAIFENESIN ER 600 MG PO TB12
600.0000 mg | ORAL_TABLET | Freq: Two times a day (BID) | ORAL | Status: DC
  Administered 2022-05-23 – 2022-05-24 (×3): 600 mg via ORAL
  Filled 2022-05-23 (×3): qty 1

## 2022-05-23 NOTE — Progress Notes (Signed)
PROGRESS NOTE    [name]  ZOX:[id number] DOB: 1955-03-18 DOA: 05/21/2022 PCP: Hoy Register, MD    Brief Narrative:   [name] is a 67 y.o. male with past medical history significant for COPD, HTN, HLD who presented to [facility] ED on 5/7 with complaint of progressive shortness of breath over the last week.  Additionally reports productive cough of green sputum.  Recently hospitalized 4/26 - 4/29 for acute COPD exacerbation.  Reports good compliance with his home medications and prednisone taper.  Denies chest pain, no palpitations, no nausea/vomiting, no dizziness, no fever, no headache, no urinary issues.  In the ED, temperature 98.4 F, HR 114, RR 22, BP 173/98, SpO2 83% on room air.  WBC 9.8, hemoglobin 14.8, platelets 164.  Sodium 139, potassium 3.7, chloride 99, CO2 34, glucose 106, BUN 12, creatinine 1.00.  High sensitive troponin 11.  BNP 23.0.  Lactic acid 0.8.  Procalcitonin less than 0.10.  Chest x-ray with no active cardiopulmonary disease process.  CT angiogram chest with no evidence of acute pulmonary embolism, findings compatible with small airway infection/inflammation with possible developing pneumonia left lower lobe.  Patient was started on IV vancomycin, cefepime, IV steroids, scheduled DuoNebs and albuterol.  TRH consulted for admission for further evaluation management of acute hypoxic respite failure secondary to acute COPD exacerbation combined with community-acquired pneumonia.  Assessment & Plan:   Acute hypoxic respiratory failure, POA COPD exacerbation Patient presenting to ED with progressive shortness of breath over the last week despite recent hospitalization for COPD exacerbation and compliance with his home medications.  Patient was noted to be hypoxic with SpO2 83% on room air as arrival.  Patient is afebrile without leukocytosis. -- Pulmicort neb twice daily -- Brovana neb twice daily -- Solu-Medrol 40 mg IV every 12 hours --  DuoNeb scheduled twice daily -- Albuterol neb every 4 hours as needed wheezing/shortness of breath -- Mucinex 6 oh milligrams p.o. twice daily -- Continue supplemental oxygen, maintain SpO2 greater than 88%, currently on 2 L nasal cannula with SpO2 92% at rest -- Ambulatory O2 screen today -- Plan outpatient referral to pulmonology for further management at time of discharge  Community-acquired pneumonia Patient reports productive cough with green sputum.  Patient is afebrile without leukocytosis.  CT angiogram chest noted for developing infectious process left lower lobe.  MRSA PCR negative, strep pneumo urinary antigen negative. -- Continue cefepime 2 g IV every 8 hours  Essential hypertension -- Irbesartan 37.5 mg p.o. daily  Hyperlipidemia -- Atorvastatin 20 mg p.o. daily  Tobacco use disorder Counseled on need for complete cessation/abstinence -- Nicotine patch  Obesity Body mass index is 35.49 kg/m.  Discussed with patient needs for aggressive lifestyle changes/weight loss as this complicates all facets of care.  Outpatient follow-up with PCP.     DVT prophylaxis: SCDs Start: 05/21/22 2306    Code Status: Full Code Family Communication: No family present at bedside this morning  Disposition Plan:  Level of care: Med-Surg Status is: Inpatient Remains inpatient appropriate because: IV steroids, IV antibiotics, scheduled neb treatments, needs weaning of oxygen before ready for discharge home, anticipate 2-3 days    Consultants:  None  Procedures:  None  Antimicrobials:  Vancomycin 5/7 - 5/7 Cefepime 5/7>>   Subjective: Patient seen examined bedside, resting comfortably.  Continues with productive cough, shortness of breath and wheezing; although slightly improved since presentation to the ED.  Patient desiring referral to pulmonology outpatient for management of his COPD when  he is ready for discharge.  No other specific questions or concerns at this time.  Denies  headache, no dizziness, no chest pain, no palpitations, no fever/chills/night sweats, no nausea/vomiting/diarrhea, no focal weakness, no fatigue, no paresthesias.  No acute events overnight per nurse staff.  Objective: Vitals:   05/23/22 0236 05/23/22 0328 05/23/22 0743 05/23/22 0750  BP:  133/77    Pulse:  99    Resp:  16    Temp:  97.8 F (36.6 C)    TempSrc:  Oral    SpO2: (!) 88% (!) 87% 90% 92%  Weight:      Height:        Intake/Output Summary (Last 24 hours) at 05/23/2022 1307 Last data filed at 05/23/2022 0900 Gross per 24 hour  Intake 320 ml  Output 400 ml  Net -80 ml   Filed Weights   05/21/22 1919  Weight: 109 kg    Examination:  Physical Exam: GEN: NAD, alert and oriented x 3, obese HEENT: NCAT, PERRL, EOMI, sclera clear, MMM PULM: Breath sounds slight diminished bilateral bases with diffuse wheezing throughout all lung fields, normal respiratory effort without accessory muscle use, on 2 L nasal cannula with SpO2 93% at rest. CV: RRR w/o M/G/R GI: abd soft, NTND, NABS, no R/G/M MSK: no peripheral edema, muscle strength globally intact 5/5 bilateral upper/lower extremities NEURO: CN II-XII intact, no focal deficits, sensation to light touch intact PSYCH: normal mood/affect Integumentary: dry/intact, no rashes or wounds    Data Reviewed: I have personally reviewed following labs and imaging studies  CBC: Recent Labs  Lab 05/21/22 1939 05/22/22 0437  WBC 9.8 7.5  NEUTROABS 6.7 7.0  HGB 14.8 13.9  HCT 44.7 42.8  MCV 97.6 98.8  PLT 164 158   Basic Metabolic Panel: Recent Labs  Lab 05/21/22 1939 05/21/22 2248 05/22/22 0437  NA 139  --  137  K 3.7  --  4.1  CL 99  --  101  CO2 34*  --  32  GLUCOSE 106*  --  182*  BUN 12  --  15  CREATININE 1.00  --  0.81  CALCIUM 8.5*  --  7.9*  MG  --  2.3 2.5*  PHOS  --   --  4.0   GFR: Estimated Creatinine Clearance: 110.6 mL/min (by C-G formula based on SCr of 0.81 mg/dL). Liver Function Tests: Recent  Labs  Lab 05/22/22 0437  AST 21  ALT 33  ALKPHOS 70  BILITOT 0.7  PROT 5.8*  ALBUMIN 3.2*   No results for input(s): "LIPASE", "AMYLASE" in the last 168 hours. No results for input(s): "AMMONIA" in the last 168 hours. Coagulation Profile: No results for input(s): "INR", "PROTIME" in the last 168 hours. Cardiac Enzymes: No results for input(s): "CKTOTAL", "CKMB", "CKMBINDEX", "TROPONINI" in the last 168 hours. BNP (last 3 results) No results for input(s): "PROBNP" in the last 8760 hours. HbA1C: No results for input(s): "HGBA1C" in the last 72 hours. CBG: No results for input(s): "GLUCAP" in the last 168 hours. Lipid Profile: No results for input(s): "CHOL", "HDL", "LDLCALC", "TRIG", "CHOLHDL", "LDLDIRECT" in the last 72 hours. Thyroid Function Tests: No results for input(s): "TSH", "T4TOTAL", "FREET4", "T3FREE", "THYROIDAB" in the last 72 hours. Anemia Panel: No results for input(s): "VITAMINB12", "FOLATE", "FERRITIN", "TIBC", "IRON", "RETICCTPCT" in the last 72 hours. Sepsis Labs: Recent Labs  Lab 05/21/22 1948 05/21/22 2248  PROCALCITON  --  <0.10  LATICACIDVEN 0.8 0.6    Recent Results (from the past  240 hour(s))  Blood culture (routine x 2)     Status: None (Preliminary result)   Collection Time: 05/21/22  7:48 PM   Specimen: BLOOD  Result Value Ref Range Status   Specimen Description   Final    BLOOD RIGHT ANTECUBITAL Performed at [facility], [address] [place], [place] [address]    Special Requests   Final    BOTTLES DRAWN AEROBIC AND ANAEROBIC Blood Culture results may not be optimal due to an excessive volume of blood received in culture bottles Performed at [facility], [address], [place], [place] [address]    Culture   Final    NO GROWTH 2 DAYS Performed at [facility], [address] [place], [place] [address]    Report Status PENDING  Incomplete  Blood culture (routine x 2)     Status: None  (Preliminary result)   Collection Time: 05/22/22  1:03 AM   Specimen: BLOOD  Result Value Ref Range Status   Specimen Description   Final    BLOOD BLOOD RIGHT HAND Performed at [facility], [address], [place], [place] [address]    Special Requests   Final    BOTTLES DRAWN AEROBIC AND ANAEROBIC Blood Culture results may not be optimal due to an inadequate volume of blood received in culture bottles Performed at [facility], [address] [place], [place] [address]    Culture   Final    NO GROWTH 1 DAY Performed at [facility], [address], [place], [place] [address]    Report Status PENDING  Incomplete  MRSA Next Gen by PCR, Nasal     Status: None   Collection Time: 05/22/22  7:53 AM  Result Value Ref Range Status   MRSA by PCR Next Gen NOT DETECTED NOT DETECTED Final    Comment: (NOTE) The GeneXpert MRSA Assay (FDA approved for NASAL specimens only), is one component of a comprehensive MRSA colonization surveillance program. It is not intended to diagnose MRSA infection nor to guide or monitor treatment for MRSA infections. Test performance is not FDA approved in patients less than 57 years old. Performed at [facility], [address], [place], [place] [address]          Radiology Studies: CT Angio Chest PE W and/or Wo Contrast  Result Date: 05/21/2022 CLINICAL DATA:  Shortness of breath and hypoxia. Ankle swelling. PE suspected. EXAM: CT ANGIOGRAPHY CHEST WITH CONTRAST TECHNIQUE: Multidetector CT imaging of the chest was performed using the standard protocol during bolus administration of intravenous contrast. Multiplanar CT image reconstructions and MIPs were obtained to evaluate the vascular anatomy. RADIATION DOSE REDUCTION: This exam was performed according to the departmental dose-optimization program which includes automated exposure control, adjustment of the mA and/or kV according to patient  size and/or use of iterative reconstruction technique. CONTRAST:  OMNIPAQUE IOHEXOL 300 MG/ML  SOLN COMPARISON:  Chest radiographs 05/21/2022; CT 07/27/2021 and CT 06/25/2021 FINDINGS: Cardiovascular: Satisfactory opacification of the pulmonary arteries to the segmental level. No pulmonary embolism. Normal heart size. Coronary artery and aortic atherosclerotic calcification. No pericardial effusion. Mediastinum/Nodes: Unremarkable esophagus.  No thoracic adenopathy. Lungs/Pleura: Mild emphysema in the upper lungs. Diffuse bronchial wall thickening with mucous plugging greatest in the lower lobes. Bilateral centrilobular micro nodules and tree-in-bud opacities greatest in the lower lobes. Subpleural focal consolidation in the medial left lower lobe. No pleural effusion or pneumothorax. The Upper Abdomen:  No acute abnormality. Musculoskeletal: Thoracic spondylosis with bridging anterior osteophytes. No acute fracture. Review of the MIP images confirms the above findings. IMPRESSION: 1. No evidence of acute pulmonary embolism. 2. Findings compatible with small airway infection/inflammation possible developing pneumonia in the left lower lobe. Follow-up after treatment is recommended to ensure resolution. Aortic Atherosclerosis (ICD10-I70.0) and Emphysema (ICD10-J43.9). Electronically Signed   By: Minerva Fester M.D.   On: 05/21/2022 21:28   DG Chest 2 View  Result Date: 05/21/2022 CLINICAL DATA:  Shortness of breath. EXAM: CHEST - 2 VIEW COMPARISON:  May 11, 2022. FINDINGS: The heart size and mediastinal contours are within normal limits. Both lungs are clear. The visualized skeletal structures are unremarkable. IMPRESSION: No active cardiopulmonary disease. Electronically Signed   By: Lupita Raider M.D.   On: 05/21/2022 20:18        Scheduled Meds:  arformoterol  15 mcg Nebulization BID   atorvastatin  20 mg Oral Daily   budesonide (PULMICORT) nebulizer solution  0.5 mg Nebulization BID    furosemide  40 mg Intravenous Daily   guaiFENesin  600 mg Oral BID   ipratropium-albuterol  3 mL Nebulization BID   irbesartan  37.5 mg Oral Daily   methylPREDNISolone (SOLU-MEDROL) injection  80 mg Intravenous Q12H   Continuous Infusions:  ceFEPime (MAXIPIME) IV 2 g (05/23/22 0723)     LOS: 2 days    Time spent: 52 minutes spent on chart review, discussion with nursing staff, consultants, updating family and interview/physical exam; more than 50% of that time was spent in counseling and/or coordination of care.    Alvira Philips [place], DO [facility] Available via Epic secure chat 7am-7pm After these hours, please refer to coverage provider listed on [email or web address] 05/23/2022, 1:07 PM

## 2022-05-23 NOTE — Progress Notes (Signed)
Ambulation O2 screening: Heart rate 110 with ambulation and O2 90% on room air. No wheezing, No Oxygen use during the day.

## 2022-05-23 NOTE — Consult Note (Signed)
   [facility] CM Inpatient Consult   05/23/2022  [name] October 23, 1955 [id number]  [facility] [[facility]]  Accountable Care Organization [ACO] Patient: [name] Medicare   *[facility] RN Hospital Liaison remote coverage review for patient admitted to [facility]    Primary Care Provider:  Hoy Register, MD, [facility]  Patient screened for less than 30 days readmission hospitalization with noted to assess for potential [facility]  [[facility]] Care Management service needs for post hospital transition for care coordination.  Review of patient's electronic medical record reveals patient is admitted with COPD exacerbation had a recent out reach with [facility] RN Care Coordinator and identified issues for potential nebulizer machine need noted.  Called patient at bedside, HIPAA verified, explained to patient for reason for outreach and post hospital follow up.  Spoke with patient regarding Nebulizer needs.  Patient states that the machine is supposed to arrive this afternoon. He said the "said part is that I have had the medication for a couple of months and borrowed a friend's neb machine to get some assistance for myself."  Spoke about ongoing follow up would be appreciated.  Encouraged patient to follow up with inpatient [facility] team member for transitional concerns. Patient agrees for post hospital follow up, if needed.  This Clinical research associate reached out to inpatient [facility] RNCM via secure chat regarding ongoing needs for Nebulizer post hospital care coordination.  Plan:  Continue to follow progress and disposition to assess for post hospital community care coordination/management needs.  Referral request for community care coordination:  will make referral for closer follow up as needed.    Of note, [facility] Care Management/Population Health does not replace or interfere with any arrangements made by the Inpatient Transition of Care team.  For questions contact:   Charlesetta Shanks, RN  BSN CCM [facility]  [phone] business mobile phone Toll free office [phone]  *Concierge Line  [phone] Fax number: [phone] [email or web address]    .

## 2022-05-24 ENCOUNTER — Other Ambulatory Visit: Payer: Self-pay

## 2022-05-24 DIAGNOSIS — J441 Chronic obstructive pulmonary disease with (acute) exacerbation: Secondary | ICD-10-CM | POA: Diagnosis not present

## 2022-05-24 MED ORDER — CEFDINIR 300 MG PO CAPS
300.0000 mg | ORAL_CAPSULE | Freq: Two times a day (BID) | ORAL | 0 refills | Status: AC
  Filled 2022-05-24: qty 10, 5d supply, fill #0

## 2022-05-24 MED ORDER — ATORVASTATIN CALCIUM 20 MG PO TABS
20.0000 mg | ORAL_TABLET | Freq: Every day | ORAL | 1 refills | Status: DC
Start: 2022-05-24 — End: 2022-12-16
  Filled 2022-05-24: qty 90, 90d supply, fill #0

## 2022-05-24 MED ORDER — VALSARTAN 160 MG PO TABS
160.0000 mg | ORAL_TABLET | Freq: Every day | ORAL | 2 refills | Status: DC
Start: 2022-05-24 — End: 2022-06-04
  Filled 2022-05-24: qty 30, 30d supply, fill #0

## 2022-05-24 MED ORDER — FUROSEMIDE 40 MG PO TABS
40.0000 mg | ORAL_TABLET | Freq: Every day | ORAL | 2 refills | Status: DC
  Filled 2022-05-24: qty 90, 90d supply, fill #0

## 2022-05-24 MED ORDER — BREZTRI AEROSPHERE 160-9-4.8 MCG/ACT IN AERO
2.0000 | INHALATION_SPRAY | Freq: Two times a day (BID) | RESPIRATORY_TRACT | 3 refills | Status: DC
Start: 2022-05-24 — End: 2022-06-27
  Filled 2022-05-24: qty 10.7, 30d supply, fill #0

## 2022-05-24 MED ORDER — GUAIFENESIN ER 600 MG PO TB12
600.0000 mg | ORAL_TABLET | Freq: Two times a day (BID) | ORAL | 0 refills | Status: AC
  Filled 2022-05-24: qty 28, 14d supply, fill #0

## 2022-05-24 MED ORDER — PREDNISONE 10 MG PO TABS
ORAL_TABLET | ORAL | 0 refills | Status: AC
  Filled 2022-05-24: qty 45, 15d supply, fill #0

## 2022-05-24 MED ORDER — ALBUTEROL SULFATE HFA 108 (90 BASE) MCG/ACT IN AERS
2.0000 | INHALATION_SPRAY | Freq: Four times a day (QID) | RESPIRATORY_TRACT | 3 refills | Status: DC | PRN
  Filled 2022-05-24: qty 54, 75d supply, fill #0

## 2022-05-24 MED ORDER — IPRATROPIUM-ALBUTEROL 0.5-2.5 (3) MG/3ML IN SOLN
3.0000 mL | Freq: Four times a day (QID) | RESPIRATORY_TRACT | 0 refills | Status: DC | PRN
Start: 2022-05-24 — End: 2023-08-25
  Filled 2022-05-24: qty 1080, 90d supply, fill #0

## 2022-05-24 NOTE — Care Management Important Message (Signed)
Important Message  Patient Details IM Letter given. Name: [name] MRN: [id number] Date of Birth: 1955/03/23   Medicare Important Message Given:  Yes     Caren Macadam 05/24/2022, 10:38 AM

## 2022-05-24 NOTE — Progress Notes (Signed)
Patient given discharge instructions, all questions/concerns answered. PIV removed, no bleeding noted, gauze and tape placed over site. Patient received After Visit Summary along with personal belongings prior to leaving. Patient left unit ambulatory in stable condition, refused wheelchair.  Farrel Gobble, RN, BSN

## 2022-05-24 NOTE — Discharge Summary (Signed)
Physician Discharge Summary  [name] ZOX:[id number] DOB: Dec 07, 1955 DOA: 05/21/2022  PCP: Hoy Register, MD  Admit date: 05/21/2022 Discharge date: 05/24/2022  Admitted From: Home Disposition: Home  Recommendations for Outpatient Follow-up:  Follow up with PCP in 1-2 weeks Ambulatory referral placed to pulmonology for management of COPD and lung cancer screening Continue cefdinir on discharge for COPD versus community-acquired pneumonia Continue prednisone taper on discharge for COPD exacerbation Refilled Breztri inhaler Discontinue chlorthalidone in favor of oral Lasix for lower extremity edema Please obtain BMP in one week Continue to encourage tobacco cessation/abstinence Consider outpatient referral for sleep study given his body habitus  Home Health: No Equipment/Devices: Nebulizer  Discharge Condition: Stable CODE STATUS: Full code Diet recommendation: Heart healthy diet  History of present illness:  [name] is a 67 y.o. male with past medical history significant for COPD, HTN, HLD who presented to [facility] ED on 5/7 with complaint of progressive shortness of breath over the last week.  Additionally reports productive cough of green sputum.  Recently hospitalized 4/26 - 4/29 for acute COPD exacerbation.  Reports good compliance with his home medications and prednisone taper.  Denies chest pain, no palpitations, no nausea/vomiting, no dizziness, no fever, no headache, no urinary issues.   In the ED, temperature 98.4 F, HR 114, RR 22, BP 173/98, SpO2 83% on room air.  WBC 9.8, hemoglobin 14.8, platelets 164.  Sodium 139, potassium 3.7, chloride 99, CO2 34, glucose 106, BUN 12, creatinine 1.00.  High sensitive troponin 11.  BNP 23.0.  Lactic acid 0.8.  Procalcitonin less than 0.10.  Chest x-ray with no active cardiopulmonary disease process.  CT angiogram chest with no evidence of acute pulmonary embolism, findings compatible with small airway  infection/inflammation with possible developing pneumonia left lower lobe.  Patient was started on IV vancomycin, cefepime, IV steroids, scheduled DuoNebs and albuterol.  TRH consulted for admission for further evaluation management of acute hypoxic respite failure secondary to acute COPD exacerbation combined with community-acquired pneumonia.    Hospital course:  Acute hypoxic respiratory failure, POA COPD exacerbation Patient presenting to ED with progressive shortness of breath over the last week despite recent hospitalization for COPD exacerbation and compliance with his home medications.  Patient was noted to be hypoxic with SpO2 83% on room air as arrival.  Patient is afebrile without leukocytosis.  Patient was started on IV steroids, Pulmicort/Brovana nebs and scheduled DuoNebs.  Patient slowly improved and was titrated off of supplemental oxygen, with no hypoxia noted on exertion.  Will continue prednisone taper on discharge.  Home Breztri inhaler refilled.  Patient also furnished with nebulizer machine to use DuoNebs as needed.  Discussed need for complete cessation of tobacco use as this is likely a complicating factor to his recurrent COPD exacerbations.  Ambulatory referral placed to pulmonology for COPD management as well as lung cancer screening.   Community-acquired pneumonia Patient reports productive cough with green sputum.  Patient is afebrile without leukocytosis.  CT angiogram chest noted for developing infectious process left lower lobe.  MRSA PCR negative, strep pneumo urinary antigen negative.  Patient was initially started on empiric antibiotics with vancomycin and cefepime, given MRSA PCR negative vancomycin was discontinued.  Will continue cefdinir to complete antibiotic course on discharge.   Essential hypertension Refilled home valsartan.  Started on Lasix 40 mg p.o. daily.  Continue to encourage compliance with home medications.   Hyperlipidemia Atorvastatin 20 mg p.o.  daily   Tobacco use disorder Counseled on need  for complete cessation/abstinence   Obesity Body mass index is 35.49 kg/m.  Discussed with patient needs for aggressive lifestyle changes/weight loss as this complicates all facets of care.  Outpatient follow-up with PCP.    Discharge Diagnoses:  Principal Problem:   COPD with acute exacerbation (HCC) Active Problems:   Tobacco abuse   Acute respiratory failure with hypoxia (HCC)   Essential hypertension   HCAP (healthcare-associated pneumonia)   Hyperlipidemia    Discharge Instructions  Discharge Instructions     Ambulatory Referral for Lung Cancer Scre   Complete by: As directed    Ambulatory referral to Pulmonology   Complete by: As directed    Reason for referral: Asthma/COPD   Call MD for:  difficulty breathing, headache or visual disturbances   Complete by: As directed    Call MD for:  extreme fatigue   Complete by: As directed    Call MD for:  persistant dizziness or light-headedness   Complete by: As directed    Call MD for:  persistant nausea and vomiting   Complete by: As directed    Call MD for:  severe uncontrolled pain   Complete by: As directed    Call MD for:  temperature >100.4   Complete by: As directed    Diet - low sodium heart healthy   Complete by: As directed    Increase activity slowly   Complete by: As directed       Allergies as of 05/24/2022       Reactions   Codeine Itching   Penicillins Itching, Other (See Comments)        Medication List     STOP taking these medications    chlorthalidone 25 MG tablet Commonly known as: HYGROTON       TAKE these medications    albuterol 108 (90 Base) MCG/ACT inhaler Commonly known as: Ventolin HFA Inhale 2 puffs into the lungs every 6 (six) hours as needed for wheezing or shortness of breath.   atorvastatin 20 MG tablet Commonly known as: LIPITOR Take 1 tablet (20 mg total) by mouth daily.   Breztri Aerosphere 160-9-4.8 MCG/ACT  Aero Generic drug: Budeson-Glycopyrrol-Formoterol Inhale 2 puffs into the lungs 2 (two) times daily.   cefdinir 300 MG capsule Commonly known as: OMNICEF Take 1 capsule (300 mg total) by mouth 2 (two) times daily for 5 days.   furosemide 40 MG tablet Commonly known as: Lasix Take 1 tablet (40 mg total) by mouth daily.   guaiFENesin 600 MG 12 hr tablet Commonly known as: MUCINEX Take 1 tablet (600 mg total) by mouth 2 (two) times daily for 14 days.   ipratropium-albuterol 0.5-2.5 (3) MG/3ML Soln Commonly known as: DUONEB Take 3 mLs by nebulization every 6 (six) hours as needed (severe wheezing).   Misc. Devices Misc Nebulizer device.  Diagnosis COPD   predniSONE 10 MG tablet Commonly known as: DELTASONE Take 5 tablets (50 mg total) by mouth daily for 3 days, THEN 4 tablets (40 mg total) daily for 3 days, THEN 3 tablets (30 mg total) daily for 3 days, THEN 2 tablets (20 mg total) daily for 3 days, THEN 1 tablet (10 mg total) daily for 3 days. Start taking on: May 25, 2022 What changed:  medication strength See the new instructions.   valsartan 160 MG tablet Commonly known as: DIOVAN Take 1 tablet (160 mg total) by mouth daily.        Follow-up Information     Hoy Register, MD. Schedule an  appointment as soon as possible for a visit in 1 week(s).   Specialty: Family Medicine Contact information: [address] [place] [address] [place] [address] [phone]         [facility] [place]. Schedule an appointment as soon as possible for a visit.   Specialty: Pulmonology Contact information: [address] [place] [address] [phone]               Allergies  Allergen Reactions   Codeine Itching   Penicillins Itching and Other (See Comments)    Consultations: None   Procedures/Studies: CT Angio Chest PE W and/or Wo Contrast  Result Date: 05/21/2022 CLINICAL DATA:  Shortness of breath  and hypoxia. Ankle swelling. PE suspected. EXAM: CT ANGIOGRAPHY CHEST WITH CONTRAST TECHNIQUE: Multidetector CT imaging of the chest was performed using the standard protocol during bolus administration of intravenous contrast. Multiplanar CT image reconstructions and MIPs were obtained to evaluate the vascular anatomy. RADIATION DOSE REDUCTION: This exam was performed according to the departmental dose-optimization program which includes automated exposure control, adjustment of the mA and/or kV according to patient size and/or use of iterative reconstruction technique. CONTRAST:  OMNIPAQUE IOHEXOL 300 MG/ML  SOLN COMPARISON:  Chest radiographs 05/21/2022; CT 07/27/2021 and CT 06/25/2021 FINDINGS: Cardiovascular: Satisfactory opacification of the pulmonary arteries to the segmental level. No pulmonary embolism. Normal heart size. Coronary artery and aortic atherosclerotic calcification. No pericardial effusion. Mediastinum/Nodes: Unremarkable esophagus.  No thoracic adenopathy. Lungs/Pleura: Mild emphysema in the upper lungs. Diffuse bronchial wall thickening with mucous plugging greatest in the lower lobes. Bilateral centrilobular micro nodules and tree-in-bud opacities greatest in the lower lobes. Subpleural focal consolidation in the medial left lower lobe. No pleural effusion or pneumothorax. The Upper Abdomen: No acute abnormality. Musculoskeletal: Thoracic spondylosis with bridging anterior osteophytes. No acute fracture. Review of the MIP images confirms the above findings. IMPRESSION: 1. No evidence of acute pulmonary embolism. 2. Findings compatible with small airway infection/inflammation possible developing pneumonia in the left lower lobe. Follow-up after treatment is recommended to ensure resolution. Aortic Atherosclerosis (ICD10-I70.0) and Emphysema (ICD10-J43.9). Electronically Signed   By: Minerva Fester M.D.   On: 05/21/2022 21:28   DG Chest 2 View  Result Date: 05/21/2022 CLINICAL DATA:   Shortness of breath. EXAM: CHEST - 2 VIEW COMPARISON:  May 11, 2022. FINDINGS: The heart size and mediastinal contours are within normal limits. Both lungs are clear. The visualized skeletal structures are unremarkable. IMPRESSION: No active cardiopulmonary disease. Electronically Signed   By: Lupita Raider M.D.   On: 05/21/2022 20:18   ECHOCARDIOGRAM COMPLETE  Result Date: 05/11/2022    ECHOCARDIOGRAM REPORT   Patient Name:   AIZEN MALO Date of Exam: 05/11/2022 Medical Rec #:  [id number]           Height:       69.0 in Accession #:    [id number]          Weight:       250.0 lb Date of Birth:  October 27, 1955          BSA:          2.272 m Patient Age:    66 years            BP:           151/80 mmHg Patient Gender: M                   HR:  53 bpm. Exam Location:  Inpatient Procedure: 2D Echo, Cardiac Doppler and Color Doppler Indications:    CHF - Acute Diastolic  History:        Patient has no prior history of Echocardiogram examinations.                 COPD; Risk Factors:Hypertension and Diabetes.  Sonographer:    Wallie Char Referring Phys: [id number] SYLVESTER I OGBATA  Sonographer Comments: Technically difficult study due to poor echo windows. Image acquisition challenging due to COPD. IMPRESSIONS  1. Left ventricular ejection fraction, by estimation, is 60 to 65%. The left ventricle has normal function. The left ventricle has no regional wall motion abnormalities. Left ventricular diastolic parameters are consistent with Grade I diastolic dysfunction (impaired relaxation).  2. Right ventricular systolic function is normal. The right ventricular size is normal. There is mildly elevated pulmonary artery systolic pressure.  3. The mitral valve is normal in structure. No evidence of mitral valve regurgitation. No evidence of mitral stenosis.  4. The aortic valve has an indeterminant number of cusps. Aortic valve regurgitation is not visualized. No aortic stenosis is present.  5. The inferior  vena cava is dilated in size with <50% respiratory variability, suggesting right atrial pressure of 15 mmHg. FINDINGS  Left Ventricle: Left ventricular ejection fraction, by estimation, is 60 to 65%. The left ventricle has normal function. The left ventricle has no regional wall motion abnormalities. The left ventricular internal cavity size was normal in size. There is  no left ventricular hypertrophy. Left ventricular diastolic parameters are consistent with Grade I diastolic dysfunction (impaired relaxation). Normal left ventricular filling pressure. Right Ventricle: The right ventricular size is normal. Right vetricular wall thickness was not well visualized. Right ventricular systolic function is normal. There is mildly elevated pulmonary artery systolic pressure. The tricuspid regurgitant velocity  is 2.43 m/s, and with an assumed right atrial pressure of 15 mmHg, the estimated right ventricular systolic pressure is 38.6 mmHg. Left Atrium: Left atrial size was normal in size. Right Atrium: Right atrial size was normal in size. Pericardium: There is no evidence of pericardial effusion. Mitral Valve: The mitral valve is normal in structure. No evidence of mitral valve regurgitation. No evidence of mitral valve stenosis. MV peak gradient, 4.6 mmHg. The mean mitral valve gradient is 2.0 mmHg. Tricuspid Valve: The tricuspid valve is normal in structure. Tricuspid valve regurgitation is not demonstrated. No evidence of tricuspid stenosis. Aortic Valve: The aortic valve has an indeterminant number of cusps. Aortic valve regurgitation is not visualized. No aortic stenosis is present. Aortic valve mean gradient measures 6.0 mmHg. Aortic valve peak gradient measures 11.1 mmHg. Aortic valve area, by VTI measures 2.04 cm. Pulmonic Valve: The pulmonic valve was not well visualized. Pulmonic valve regurgitation is not visualized. No evidence of pulmonic stenosis. Aorta: The aortic root is normal in size and structure.  Venous: The inferior vena cava is dilated in size with less than 50% respiratory variability, suggesting right atrial pressure of 15 mmHg. IAS/Shunts: No atrial level shunt detected by color flow Doppler.  LEFT VENTRICLE PLAX 2D LVIDd:         5.00 cm      Diastology LVIDs:         3.60 cm      LV e' medial:    9.97 cm/s LV PW:         0.80 cm      LV E/e' medial:  9.0 LV IVS:  0.90 cm      LV e' lateral:   12.10 cm/s LVOT diam:     1.90 cm      LV E/e' lateral: 7.4 LV SV:         63 LV SV Index:   28 LVOT Area:     2.84 cm  LV Volumes (MOD) LV vol d, MOD A2C: 69.3 ml LV vol d, MOD A4C: 108.0 ml LV vol s, MOD A2C: 32.3 ml LV vol s, MOD A4C: 49.8 ml LV SV MOD A2C:     37.0 ml LV SV MOD A4C:     108.0 ml LV SV MOD BP:      46.4 ml RIGHT VENTRICLE             IVC RV Basal diam:  4.20 cm     IVC diam: 2.50 cm RV S prime:     16.10 cm/s TAPSE (M-mode): 3.1 cm LEFT ATRIUM             Index        RIGHT ATRIUM           Index LA diam:        4.20 cm 1.85 cm/m   RA Area:     17.10 cm LA Vol (A2C):   43.8 ml 19.28 ml/m  RA Volume:   46.20 ml  20.34 ml/m LA Vol (A4C):   42.2 ml 18.58 ml/m LA Biplane Vol: 44.6 ml 19.63 ml/m  AORTIC VALVE AV Area (Vmax):    2.00 cm AV Area (Vmean):   2.06 cm AV Area (VTI):     2.04 cm AV Vmax:           166.50 cm/s AV Vmean:          116.000 cm/s AV VTI:            0.310 m AV Peak Grad:      11.1 mmHg AV Mean Grad:      6.0 mmHg LVOT Vmax:         117.67 cm/s LVOT Vmean:        84.267 cm/s LVOT VTI:          0.223 m LVOT/AV VTI ratio: 0.72  AORTA Ao Root diam: 3.60 cm MITRAL VALVE                TRICUSPID VALVE MV Area (PHT): 3.85 cm     TR Peak grad:   23.6 mmHg MV Area VTI:   2.43 cm     TR Vmax:        243.00 cm/s MV Peak grad:  4.6 mmHg MV Mean grad:  2.0 mmHg     SHUNTS MV Vmax:       1.07 m/s     Systemic VTI:  0.22 m MV Vmean:      72.4 cm/s    Systemic Diam: 1.90 cm MV Decel Time: 197 msec MV E velocity: 90.00 cm/s MV A velocity: 101.00 cm/s MV E/A ratio:  0.89  Dina Rich MD Electronically signed by Dina Rich MD Signature Date/Time: 05/11/2022/2:09:01 PM    Final    DG CHEST PORT 1 VIEW  Result Date: 05/11/2022 CLINICAL DATA:  Pneumonia.  Rule out CHF. EXAM: PORTABLE CHEST 1 VIEW COMPARISON:  May 10, 2022 chest x-ray FINDINGS: Opacity in left base has resolved. The heart, hila, mediastinum, lungs, and pleura are normal. No pneumonia. No overt edema. IMPRESSION: No active disease. Electronically Signed   By: Onalee Hua  Mayford Knife III M.D.   On: 05/11/2022 12:00   DG Chest Port 1 View  Result Date: 05/10/2022 CLINICAL DATA:  Shortness of breath EXAM: PORTABLE CHEST 1 VIEW COMPARISON:  06/25/2021 FINDINGS: Hazy appearance of the lower left chest on the first view which is less pronounced on the second view. No edema, effusion, or pneumothorax. Normal heart size and mediastinal contours. IMPRESSION: Mild hazy density at the left base, bronchopneumonia or atelectasis could give this appearance. Electronically Signed   By: Tiburcio Pea M.D.   On: 05/10/2022 04:44     Subjective: Patient seen examined bedside, resting comfortably.  Sitting at edge of bed.  Remains off of oxygen.  Shortness of breath much improved with minimal wheezing.  Received nebulizer at bedside yesterday.  Ready for discharge home.  States understands the importance of tobacco cessation.  Also discussed with him referral to pulmonology outpatient which he desires.  Denies headache, no dizziness, no chest pain, no palpitations, no significant shortness of breath, no abdominal pain, no fever/chills/night sweats, no nausea/vomiting/diarrhea, no focal weakness, no fatigue, no paresthesias.  No acute events overnight per nurse staff.  Discharge Exam: Vitals:   05/23/22 2140 05/24/22 0835  BP: (!) 161/94   Pulse: 97   Resp: 18   Temp: 97.7 F (36.5 C)   SpO2: 92% 94%   Vitals:   05/23/22 1429 05/23/22 2001 05/23/22 2140 05/24/22 0835  BP: (!) 165/92  (!) 161/94   Pulse: 95  97    Resp: 18  18   Temp: 97.6 F (36.4 C)  97.7 F (36.5 C)   TempSrc: Oral  Oral   SpO2: 92% 97% 92% 94%  Weight:      Height:        Physical Exam: GEN: NAD, alert and oriented x 3, obese HEENT: NCAT, PERRL, EOMI, sclera clear, MMM PULM: Late expiratory wheezing bilateral lung fields upper lobes, otherwise clear to auscultation, normal respiratory effort, on room air with SpO2 92% CV: RRR w/o M/G/R GI: abd soft, NTND, NABS, no R/G/M MSK: Trace to 1+ peripheral lower extremity edema, muscle strength globally intact 5/5 bilateral upper/lower extremities NEURO: CN II-XII intact, no focal deficits, sensation to light touch intact PSYCH: normal mood/affect Integumentary: dry/intact, no rashes or wounds    The results of significant diagnostics from this hospitalization (including imaging, microbiology, ancillary and laboratory) are listed below for reference.     Microbiology: Recent Results (from the past 240 hour(s))  Blood culture (routine x 2)     Status: None (Preliminary result)   Collection Time: 05/21/22  7:48 PM   Specimen: BLOOD  Result Value Ref Range Status   Specimen Description   Final    BLOOD RIGHT ANTECUBITAL Performed at [facility], [address], [place], [place] [address]    Special Requests   Final    BOTTLES DRAWN AEROBIC AND ANAEROBIC Blood Culture results may not be optimal due to an excessive volume of blood received in culture bottles Performed at [facility], [address] [place], [place] [address]    Culture   Final    NO GROWTH 3 DAYS Performed at [facility], [address] [place], [place] [address]    Report Status PENDING  Incomplete  Blood culture (routine x 2)     Status: None (Preliminary result)   Collection Time: 05/22/22  1:03 AM   Specimen: BLOOD  Result Value Ref Range Status   Specimen Description   Final  BLOOD BLOOD RIGHT HAND Performed at [facility],  [address] [place], [place] [address]    Special Requests   Final    BOTTLES DRAWN AEROBIC AND ANAEROBIC Blood Culture results may not be optimal due to an inadequate volume of blood received in culture bottles Performed at [facility], [address], [place], [place] [address]    Culture   Final    NO GROWTH 2 DAYS Performed at [facility], [address], [place], [place] [address]    Report Status PENDING  Incomplete  MRSA Next Gen by PCR, Nasal     Status: None   Collection Time: 05/22/22  7:53 AM  Result Value Ref Range Status   MRSA by PCR Next Gen NOT DETECTED NOT DETECTED Final    Comment: (NOTE) The GeneXpert MRSA Assay (FDA approved for NASAL specimens only), is one component of a comprehensive MRSA colonization surveillance program. It is not intended to diagnose MRSA infection nor to guide or monitor treatment for MRSA infections. Test performance is not FDA approved in patients less than 32 years old. Performed at [facility], [address] [place], [place] [address]      Labs: BNP (last 3 results) Recent Labs    05/11/22 1133 05/21/22 1940  BNP 65.6 23.0   Basic Metabolic Panel: Recent Labs  Lab 05/21/22 1939 05/21/22 2248 05/22/22 0437  NA 139  --  137  K 3.7  --  4.1  CL 99  --  101  CO2 34*  --  32  GLUCOSE 106*  --  182*  BUN 12  --  15  CREATININE 1.00  --  0.81  CALCIUM 8.5*  --  7.9*  MG  --  2.3 2.5*  PHOS  --   --  4.0   Liver Function Tests: Recent Labs  Lab 05/22/22 0437  AST 21  ALT 33  ALKPHOS 70  BILITOT 0.7  PROT 5.8*  ALBUMIN 3.2*   No results for input(s): "LIPASE", "AMYLASE" in the last 168 hours. No results for input(s): "AMMONIA" in the last 168 hours. CBC: Recent Labs  Lab 05/21/22 1939 05/22/22 0437  WBC 9.8 7.5  NEUTROABS 6.7 7.0  HGB 14.8 13.9  HCT 44.7 42.8  MCV 97.6 98.8  PLT 164 158   Cardiac Enzymes: No results for input(s): "CKTOTAL", "CKMB",  "CKMBINDEX", "TROPONINI" in the last 168 hours. BNP: Invalid input(s): "POCBNP" CBG: No results for input(s): "GLUCAP" in the last 168 hours. D-Dimer Recent Labs    05/21/22 1939  DDIMER <0.27   Hgb A1c No results for input(s): "HGBA1C" in the last 72 hours. Lipid Profile No results for input(s): "CHOL", "HDL", "LDLCALC", "TRIG", "CHOLHDL", "LDLDIRECT" in the last 72 hours. Thyroid function studies No results for input(s): "TSH", "T4TOTAL", "T3FREE", "THYROIDAB" in the last 72 hours.  Invalid input(s): "FREET3" Anemia work up No results for input(s): "VITAMINB12", "FOLATE", "FERRITIN", "TIBC", "IRON", "RETICCTPCT" in the last 72 hours. Urinalysis    Component Value Date/Time   COLORURINE YELLOW 06/11/2017 1927   APPEARANCEUR CLEAR 06/11/2017 1927   LABSPEC 1.014 06/11/2017 1927   PHURINE 5.0 06/11/2017 1927   GLUCOSEU NEGATIVE 06/11/2017 1927   HGBUR MODERATE (A) 06/11/2017 1927   BILIRUBINUR NEGATIVE 06/11/2017 1927   KETONESUR NEGATIVE 06/11/2017 1927   PROTEINUR NEGATIVE 06/11/2017 1927   UROBILINOGEN 1.0 05/28/2014 1848   NITRITE NEGATIVE 06/11/2017 1927   LEUKOCYTESUR NEGATIVE 06/11/2017 1927   Sepsis Labs Recent  Labs  Lab 05/21/22 1939 05/22/22 0437  WBC 9.8 7.5   Microbiology Recent Results (from the past 240 hour(s))  Blood culture (routine x 2)     Status: None (Preliminary result)   Collection Time: 05/21/22  7:48 PM   Specimen: BLOOD  Result Value Ref Range Status   Specimen Description   Final    BLOOD RIGHT ANTECUBITAL Performed at [facility], [address], [place], [place] [address]    Special Requests   Final    BOTTLES DRAWN AEROBIC AND ANAEROBIC Blood Culture results may not be optimal due to an excessive volume of blood received in culture bottles Performed at [facility], [address] [place], [place] [address]    Culture   Final    NO GROWTH 3 DAYS Performed at [facility], [address], [place], [place] [address]    Report Status PENDING  Incomplete  Blood culture (routine x 2)     Status: None (Preliminary result)   Collection Time: 05/22/22  1:03 AM   Specimen: BLOOD  Result Value Ref Range Status   Specimen Description   Final    BLOOD BLOOD RIGHT HAND Performed at [facility], [address], [place], [place] [address]    Special Requests   Final    BOTTLES DRAWN AEROBIC AND ANAEROBIC Blood Culture results may not be optimal due to an inadequate volume of blood received in culture bottles Performed at [facility], [address] [place], [place] [address]    Culture   Final    NO GROWTH 2 DAYS Performed at [facility], [address], [place], [place] [address]    Report Status PENDING  Incomplete  MRSA Next Gen by PCR, Nasal     Status: None   Collection Time: 05/22/22  7:53 AM  Result Value Ref Range Status   MRSA by PCR Next Gen NOT DETECTED NOT DETECTED Final    Comment: (NOTE) The GeneXpert MRSA Assay (FDA approved for NASAL specimens only), is one component of a comprehensive MRSA colonization surveillance program. It is not intended to diagnose MRSA infection nor to guide or monitor treatment for MRSA infections. Test performance is not FDA approved in patients less than 72 years old. Performed at [facility], [address] [place], [place] [address]      Time coordinating discharge: Over 30 minutes  SIGNED:   Alvira Philips [place], DO  [facility] 05/24/2022, 9:59 AM

## 2022-05-25 LAB — CULTURE, BLOOD (ROUTINE X 2): Culture: NO GROWTH

## 2022-05-26 LAB — CULTURE, BLOOD (ROUTINE X 2)

## 2022-05-27 ENCOUNTER — Telehealth: Payer: Self-pay | Admitting: *Deleted

## 2022-05-27 ENCOUNTER — Other Ambulatory Visit: Payer: Self-pay

## 2022-05-27 ENCOUNTER — Telehealth: Payer: Self-pay

## 2022-05-27 LAB — CULTURE, BLOOD (ROUTINE X 2): Culture: NO GROWTH

## 2022-05-27 NOTE — Transitions of Care (Post Inpatient/ED Visit) (Signed)
05/27/2022  Name: [name] MRN: [id number] DOB: Sep 26, 1955  Today's TOC FU Call Status: Today's TOC FU Call Status:: Successful TOC FU Call Competed TOC FU Call Complete Date: 05/27/22  Transition Care Management Follow-up Telephone Call Date of Discharge: 05/24/22 Discharge Facility: [facility]) Primary Inpatient Discharge Diagnosis:: "COPD exacverbation,PNA of both lungs" How have you been since you were released from the hospital?: Better (Pt pleased to report that he is "doing & feeling much better." Denies any SOB at present. States his coughing is improving and sputum is white.) Any questions or concerns?: No  Items Reviewed: Did you receive and understand the discharge instructions provided?: Yes Medications obtained,verified, and reconciled?: Yes (Medications Reviewed) (Pt was not taking Diovan-states he "think about taking it as he feels like it is the culprit of making his feet swell") Any new allergies since your discharge?: No Dietary orders reviewed?: Yes Type of Diet Ordered:: low salt/heart healthy Do you have support at home?: Yes People in Home: significant other Name of Support/Comfort Primary Source: Antionia  Medications Reviewed Today: Medications Reviewed Today     Reviewed by Charlyn Minerva, RN (Registered Nurse) on 05/27/22 at 1215  Med List Status: <None>   Medication Order Taking? Sig Documenting Provider Last Dose Status Informant  albuterol (VENTOLIN HFA) 108 (90 Base) MCG/ACT inhaler [id number] Yes Inhale 2 puffs into the lungs every 6 (six) hours as needed for wheezing or shortness of breath. [place], Alvira Philips, DO Taking Active   atorvastatin (LIPITOR) 20 MG tablet [id number] Yes Take 1 tablet (20 mg total) by mouth daily. [place], Eric J, DO Taking Active   Budeson-Glycopyrrol-Formoterol (BREZTRI AEROSPHERE) 160-9-4.8 MCG/ACT Sandrea Matte [id number] Yes Inhale 2 puffs into the lungs 2 (two) times daily. [place], Alvira Philips, DO Taking  Active   cefdinir (OMNICEF) 300 MG capsule [id number] Yes Take 1 capsule (300 mg total) by mouth 2 (two) times daily for 5 days. [place], Alvira Philips, DO Taking Active   furosemide (LASIX) 40 MG tablet [id number] Yes Take 1 tablet (40 mg total) by mouth daily. [place], Alvira Philips, DO Taking Active   guaiFENesin (MUCINEX) 600 MG 12 hr tablet [id number] Yes Take 1 tablet (600 mg total) by mouth 2 (two) times daily for 14 days. [place], Alvira Philips, DO Taking Active   ipratropium-albuterol (DUONEB) 0.5-2.5 (3) MG/3ML SOLN [id number] Yes Take 3 mLs by nebulization every 6 (six) hours as needed (severe wheezing). [place], Alvira Philips, DO Taking Active   Misc. Devices MISC [id number] Yes Nebulizer device.  Diagnosis COPD Hoy Register, MD Taking Active Self, Pharmacy Records  predniSONE (DELTASONE) 10 MG tablet [id number] Yes Take 5 tablets (50 mg total) by mouth daily for 3 days, THEN 4 tablets (40 mg total) daily for 3 days, THEN 3 tablets (30 mg total) daily for 3 days, THEN 2 tablets (20 mg total) daily for 3 days, THEN 1 tablet (10 mg total) daily for 3 days. [place], Alvira Philips, DO Taking Active   valsartan (DIOVAN) 160 MG tablet [id number] No Take 1 tablet (160 mg total) by mouth daily.  Patient not taking: Reported on 05/27/2022   [place], Eric J, DO Not Taking Active             Home Care and Equipment/Supplies: Were Home Health Services Ordered?: NA Any new equipment or medical supplies ordered?: Yes Name of Medical supply agency?: [employer] Were you able to get the equipment/medical supplies?: Yes Do you have any questions related to the use of the equipment/supplies?: No  Functional Questionnaire: Do you need assistance with bathing/showering or dressing?: No Do you need assistance with meal preparation?: No Do you need assistance with eating?: No Do you have difficulty maintaining continence: No Do you need assistance with getting out of bed/getting out of a chair/moving?: No Do you have difficulty  managing or taking your medications?: No  Follow up appointments reviewed: PCP Follow-up appointment confirmed?: Yes Date of PCP follow-up appointment?: 06/04/22 Follow-up Provider: Dr. Alvis Lemmings Specialist [facility] Follow-up appointment confirmed?: No Reason Specialist Follow-Up Not Confirmed: Patient has Specialist Provider Number and will Call for Appointment (pt will call lung MD office to make an appt) Do you need transportation to your follow-up appointment?: No Do you understand care options if your condition(s) worsen?: Yes-patient verbalized understanding   TOC Interventions Today    Flowsheet Row Most Recent Value  TOC Interventions   TOC Interventions Discussed/Reviewed TOC Interventions Discussed, S/S of infection      Interventions Today    Flowsheet Row Most Recent Value  Chronic Disease   Chronic disease during today's visit Chronic Obstructive Pulmonary Disease (COPD), Hypertension (HTN)  General Interventions   General Interventions Discussed/Reviewed General Interventions Discussed, Doctor Visits, Referral to Nurse, Durable Medical Equipment (DME)  [appt scheduled with assigned RN care coordinator]  Doctor Visits Discussed/Reviewed Doctor Visits Discussed, PCP, Specialist  Durable Medical Equipment (DME) BP Cuff, Other  [scale-wgt monitoring, neb machine]  PCP/Specialist Visits Compliance with follow-up visit  Communication with RN  Education Interventions   Education Provided Provided Education  Provided Verbal Education On Nutrition, Medication, When to see the doctor, Other  [sx mgmt]  Nutrition Interventions   Nutrition Discussed/Reviewed Nutrition Discussed, Adding fruits and vegetables, Decreasing salt, Decreasing fats  Pharmacy Interventions   Pharmacy Dicussed/Reviewed Pharmacy Topics Discussed, Medications and their functions  Safety Interventions   Safety Discussed/Reviewed Safety Discussed       Alessandra Grout [facility] Care  Management Care Management Community Coordinator Direct Phone: [phone] Toll Free: [phone] Fax: [phone]

## 2022-05-27 NOTE — Progress Notes (Unsigned)
  Care Coordination  Outreach Note  05/27/2022 Name: [name] MRN: [id number] DOB: 1955-10-25   Care Coordination Outreach Attempts: An unsuccessful telephone outreach was attempted today to offer the patient information about available care coordination services.  Follow Up Plan:  Additional outreach attempts will be made to offer the patient care coordination information and services.   Encounter Outcome:  No Answer  Christie Nottingham  Care Coordination Care Guide  Direct Dial: [phone]

## 2022-05-28 ENCOUNTER — Other Ambulatory Visit: Payer: Self-pay

## 2022-05-28 NOTE — Progress Notes (Signed)
  Care Coordination  Outreach Note  05/28/2022 Name: Taiga Gundy MRN: [id number] DOB: 23-Jun-1955   Care Coordination Outreach Attempts: A second unsuccessful outreach was attempted today to offer the patient with information about available care coordination services.  Follow Up Plan:  Additional outreach attempts will be made to offer the patient care coordination information and services.   Encounter Outcome:  No Answer  Christie Nottingham  Care Coordination Care Guide  Direct Dial: [phone]

## 2022-05-28 NOTE — Progress Notes (Signed)
  Care Coordination   Note   05/28/2022 Name: [name] MRN: [id number] DOB: 05/23/1955  [name] is a 67 y.o. year old male who sees Hoy Register, MD for primary care. I reached out to Vilinda Blanks by phone today to offer care coordination services.  [name] was given information about Care Coordination services today including:   The Care Coordination services include support from the care team which includes your Nurse Coordinator, Clinical Social Worker, or Pharmacist.  The Care Coordination team is here to help remove barriers to the health concerns and goals most important to you. Care Coordination services are voluntary, and the patient may decline or stop services at any time by request to their care team member.   Care Coordination Consent Status: Patient agreed to services and verbal consent obtained.   Follow up plan:  Telephone appointment with care coordination team member scheduled for:  5/17/  Encounter Outcome:  Pt. Scheduled  [name]  Direct Dial: [phone]

## 2022-05-31 ENCOUNTER — Ambulatory Visit: Payer: Self-pay

## 2022-05-31 ENCOUNTER — Other Ambulatory Visit: Payer: Self-pay

## 2022-05-31 NOTE — Progress Notes (Signed)
   [name] 1955-12-02 [id number]  Patient needs assistance getting a BP cuff Patient expressed interest in natural medicines and does not want to use chronic medications.  Patient outreached by Francetta Found, PharmD Candidate on 05/31/2022 while they were picking up prescriptions at [facility]  Blood Pressure Readings: Last documented ambulatory systolic blood pressure: 161 Last documented ambulatory diastolic blood pressure: 94 Does the patient have a validated home blood pressure machine?: No (Patient had one, does not know where it went)   Medication review was performed. Is the patient taking their medications as prescribed?: No Differences from their prescribed list include: Patient is not taking valsartan or atorvastatin.  The following barriers to adherence were noted: Does the patient have cost concerns?: No Does the patient have transportation concerns?: No Does the patient need assistance obtaining refills?: Yes (Sometimes for the albuterol rescure inhaler if using frequently) Does the patient occassionally forget to take some of their prescribed medications?: No (Patient takes meds later in day if he forgets them in the morning) Does the patient feel like one/some of their medications make them feel poorly?: Yes (Patient says the valsartan causes lower leg edema) Does the patient have questions or concerns about their medications?: No Does the patient have a follow up scheduled with their primary care provider/cardiologist?: Yes   Interventions: Interventions Completed: Medications were reviewed  The patient has follow up scheduled:  PCP: Hoy Register, MD   Francetta Found, Student-PharmD

## 2022-05-31 NOTE — Patient Outreach (Signed)
  Care Coordination   05/31/2022 Name: [name] MRN: [id number] DOB: 1955-06-15   Care Coordination Outreach Attempts:  An unsuccessful telephone outreach was attempted for a scheduled appointment today.  Follow Up Plan:  Additional outreach attempts will be made to offer the patient care coordination information and services.   Encounter Outcome:  No Answer   Care Coordination Interventions:  No, not indicated    Delsa Sale, RN, BSN, CCM Care Management Coordinator [facility] Care Management  Direct Phone: [phone]

## 2022-06-04 ENCOUNTER — Other Ambulatory Visit: Payer: Self-pay

## 2022-06-04 ENCOUNTER — Encounter: Payer: Self-pay | Admitting: Family Medicine

## 2022-06-04 ENCOUNTER — Ambulatory Visit: Payer: Medicare HMO | Attending: Family Medicine | Admitting: Family Medicine

## 2022-06-04 VITALS — BP 139/79 | HR 94 | Ht 69.0 in | Wt 238.6 lb

## 2022-06-04 DIAGNOSIS — J441 Chronic obstructive pulmonary disease with (acute) exacerbation: Secondary | ICD-10-CM | POA: Diagnosis not present

## 2022-06-04 DIAGNOSIS — I10 Essential (primary) hypertension: Secondary | ICD-10-CM

## 2022-06-04 DIAGNOSIS — D170 Benign lipomatous neoplasm of skin and subcutaneous tissue of head, face and neck: Secondary | ICD-10-CM

## 2022-06-04 DIAGNOSIS — R600 Localized edema: Secondary | ICD-10-CM | POA: Diagnosis not present

## 2022-06-04 DIAGNOSIS — Z9189 Other specified personal risk factors, not elsewhere classified: Secondary | ICD-10-CM

## 2022-06-04 MED ORDER — MISC. DEVICES MISC
0 refills | Status: DC
Start: 2022-06-04 — End: 2022-12-03

## 2022-06-04 MED ORDER — ALBUTEROL SULFATE HFA 108 (90 BASE) MCG/ACT IN AERS
2.0000 | INHALATION_SPRAY | Freq: Four times a day (QID) | RESPIRATORY_TRACT | 3 refills | Status: DC | PRN
Start: 2022-06-04 — End: 2022-10-08
  Filled 2022-06-04: qty 54, 75d supply, fill #0
  Filled 2022-06-04: qty 18, 25d supply, fill #0
  Filled 2022-06-10: qty 54, 75d supply, fill #0
  Filled 2022-08-16: qty 20.1, 75d supply, fill #1
  Filled 2022-08-19: qty 54, 75d supply, fill #1

## 2022-06-04 NOTE — Progress Notes (Unsigned)
Subjective:  Patient ID: [name], male    DOB: 1955/07/15  Age: 67 y.o. MRN: [id number]  CC: Hospitalization Follow-up   HPI Uyless Safron is a 67 y.o. year old male with a history of hypertension, hyperlipidemia, COPD, tobacco dependence (greater than 40-pack-year) here for transitional care visit. He had been referred to the ED at his last visit after he was found to be dyspneic, hypoxic and was subsequently hospitalized for COPD exacerbation and CAP He was treated with IV antibiotics, IV steroids and DuoNebs. CT angio chest revealed: IMPRESSION: 1. No evidence of acute pulmonary embolism. 2. Findings compatible with small airway infection/inflammation possible developing pneumonia in the left lower lobe. Follow-up after treatment is recommended to ensure resolution.   Aortic Atherosclerosis (ICD10-I70.0) and Emphysema (ICD10-J43.9).  Interval History:  He has been breathing okay and is adherent with his inhalers.  He discontinued Valsartan 'because it caused pedal edema' and edema resolved after he discontinued it. Echo from 04/2022 revealed EF of 60 to 65%, grade 1 DD, no regional wall motion abnormalities. Since discharge from hospitalization he has not smoked but has been using the Nicorette gums. He has been out of town and has not taken his Lasix for 2 days.  Denies presence of dyspnea or chest pain.  He does have an upcoming appointment with pulmonary.  Past Medical History:  Diagnosis Date   Asthma    COPD (chronic obstructive pulmonary disease) (HCC)    Hx of adenomatous polyp of colon 01/13/2020   Hypertension     Past Surgical History:  Procedure Laterality Date   HAND SURGERY Bilateral left 4098,1191 Rt    Family History  Problem Relation Age of Onset   Hypertension Paternal Uncle    Cancer Maternal Grandmother    Hypertension Maternal Grandfather    Colon cancer Cousin    Colon polyps Neg Hx    Esophageal cancer Neg Hx    Stomach  cancer Neg Hx    Rectal cancer Neg Hx     Social History   Socioeconomic History   Marital status: Significant Other    Spouse name: Not on file   Number of children: Not on file   Years of education: Not on file   Highest education level: Not on file  Occupational History   Not on file  Tobacco Use   Smoking status: Some Days    Packs/day: 1.00    Years: 45.00    Additional pack years: 0.00    Total pack years: 45.00    Types: Cigarettes   Smokeless tobacco: Never  Vaping Use   Vaping Use: Never used  Substance and Sexual Activity   Alcohol use: Yes    Alcohol/week: 1.0 standard drink of alcohol    Types: 1 Standard drinks or equivalent per week    Comment: "every once in awhile"-3 times a month per pt   Drug use: Yes    Frequency: 1.0 times per week    Types: Marijuana    Comment: occasionally marijuana-3 times a month per pt   Sexual activity: Not on file  Other Topics Concern   Not on file  Social History Narrative   Not on file   Social Determinants of Health   Financial Resource Strain: Medium Risk (05/06/2022)   Overall Financial Resource Strain (CARDIA)    Difficulty of Paying Living Expenses: Somewhat hard  Food Insecurity: No Food Insecurity (05/22/2022)   Hunger Vital Sign    Worried About Running Out of  Food in the Last Year: Never true    Ran Out of Food in the Last Year: Never true  Transportation Needs: No Transportation Needs (05/22/2022)   PRAPARE - Administrator, Civil Service (Medical): No    Lack of Transportation (Non-Medical): No  Physical Activity: Inactive (05/06/2022)   Exercise Vital Sign    Days of Exercise per Week: 0 days    Minutes of Exercise per Session: 0 min  Stress: No Stress Concern Present (05/06/2022)   [employer] of Occupational Health - Occupational Stress Questionnaire    Feeling of Stress : Only a little  Social Connections: Moderately Integrated (05/06/2022)   Social Connection and Isolation Panel  [NHANES]    Frequency of Communication with Friends and Family: More than three times a week    Frequency of Social Gatherings with Friends and Family: More than three times a week    Attends Religious Services: 1 to 4 times per year    Active Member of [employer] or Organizations: No    Attends Banker Meetings: Never    Marital Status: Living with partner    Allergies  Allergen Reactions   Codeine Itching   Penicillins Itching and Other (See Comments)    Outpatient Medications Prior to Visit  Medication Sig Dispense Refill   atorvastatin (LIPITOR) 20 MG tablet Take 1 tablet (20 mg total) by mouth daily. 90 tablet 1   Budeson-Glycopyrrol-Formoterol (BREZTRI AEROSPHERE) 160-9-4.8 MCG/ACT AERO Inhale 2 puffs into the lungs 2 (two) times daily. 10.7 g 3   furosemide (LASIX) 40 MG tablet Take 1 tablet (40 mg total) by mouth daily. 90 tablet 2   guaiFENesin (MUCINEX) 600 MG 12 hr tablet Take 1 tablet (600 mg total) by mouth 2 (two) times daily for 14 days. 28 tablet 0   ipratropium-albuterol (DUONEB) 0.5-2.5 (3) MG/3ML SOLN Take 3 mLs by nebulization every 6 (six) hours as needed (severe wheezing). 1080 mL 0   Misc. Devices MISC Nebulizer device.  Diagnosis COPD 1 each 0   predniSONE (DELTASONE) 10 MG tablet Take 5 tablets (50 mg total) by mouth daily for 3 days, THEN 4 tablets (40 mg total) daily for 3 days, THEN 3 tablets (30 mg total) daily for 3 days, THEN 2 tablets (20 mg total) daily for 3 days, THEN 1 tablet (10 mg total) daily for 3 days. 45 tablet 0   albuterol (VENTOLIN HFA) 108 (90 Base) MCG/ACT inhaler Inhale 2 puffs into the lungs every 6 (six) hours as needed for wheezing or shortness of breath. 54 g 3   valsartan (DIOVAN) 160 MG tablet Take 1 tablet (160 mg total) by mouth daily. 30 tablet 2   No facility-administered medications prior to visit.     ROS Review of Systems  Constitutional:  Negative for activity change and appetite change.  HENT:  Negative for sinus  pressure and sore throat.   Respiratory:  Negative for chest tightness, shortness of breath and wheezing.   Cardiovascular:  Positive for leg swelling. Negative for chest pain and palpitations.  Gastrointestinal:  Negative for abdominal distention, abdominal pain and constipation.  Genitourinary: Negative.   Musculoskeletal: Negative.   Psychiatric/Behavioral:  Negative for behavioral problems and dysphoric mood.     Objective:  BP 139/79   Pulse 94   Ht 5\' 9"  (1.753 m)   Wt 238 lb 9.6 oz (108.2 kg)   SpO2 99%   BMI 35.24 kg/m      06/04/2022    2:35  PM 05/23/2022    9:40 PM 05/23/2022    2:29 PM  BP/Weight  Systolic BP 139 161 165  Diastolic BP 79 94 92  Wt. (Lbs) 238.6    BMI 35.24 kg/m2        Physical Exam Constitutional:      Appearance: He is well-developed.  Cardiovascular:     Rate and Rhythm: Normal rate.     Heart sounds: Normal heart sounds. No murmur heard. Pulmonary:     Effort: Pulmonary effort is normal.     Breath sounds: Normal breath sounds. No wheezing or rales.  Chest:     Chest wall: No tenderness.  Abdominal:     General: Bowel sounds are normal. There is no distension.     Palpations: Abdomen is soft. There is no mass.     Tenderness: There is no abdominal tenderness.  Musculoskeletal:        General: Normal range of motion.     Right lower leg: Edema (1+ pitting) present.     Left lower leg: Edema (1+ pitting) present.  Skin:    Comments: Soft fluctuant mobile lump on posterior right neck  Neurological:     Mental Status: He is alert and oriented to person, place, and time.  Psychiatric:        Mood and Affect: Mood normal.        Latest Ref Rng & Units 05/22/2022    4:37 AM 05/21/2022    7:39 PM 05/12/2022    5:37 AM  CMP  Glucose 70 - 99 mg/dL 161  096  045   BUN 8 - 23 mg/dL 15  12  16    Creatinine 0.61 - 1.24 mg/dL 4.09  8.11  9.14   Sodium 135 - 145 mmol/L 137  139  137   Potassium 3.5 - 5.1 mmol/L 4.1  3.7  3.9   Chloride 98 -  111 mmol/L 101  99  96   CO2 22 - 32 mmol/L 32  34  28   Calcium 8.9 - 10.3 mg/dL 7.9  8.5  8.8   Total Protein 6.5 - 8.1 g/dL 5.8     Total Bilirubin 0.3 - 1.2 mg/dL 0.7     Alkaline Phos 38 - 126 U/L 70     AST 15 - 41 U/L 21     ALT 0 - 44 U/L 33       Lipid Panel     Component Value Date/Time   CHOL 183 06/19/2021 0917   TRIG 50 06/19/2021 0917   HDL 50 06/19/2021 0917   CHOLHDL 4.0 04/27/2020 1659   CHOLHDL 4.8 07/15/2013 1152   VLDL 19 07/15/2013 1152   LDLCALC 123 (H) 06/19/2021 0917    CBC    Component Value Date/Time   WBC 7.5 05/22/2022 0437   RBC 4.33 05/22/2022 0437   HGB 13.9 05/22/2022 0437   HGB 15.1 06/19/2021 0917   HCT 42.8 05/22/2022 0437   HCT 44.4 06/19/2021 0917   PLT 158 05/22/2022 0437   PLT 179 06/19/2021 0917   MCV 98.8 05/22/2022 0437   MCV 93 06/19/2021 0917   MCH 32.1 05/22/2022 0437   MCHC 32.5 05/22/2022 0437   RDW 14.5 05/22/2022 0437   RDW 13.2 06/19/2021 0917   LYMPHSABS 0.3 (L) 05/22/2022 0437   LYMPHSABS 1.6 06/19/2021 0917   MONOABS 0.1 05/22/2022 0437   EOSABS 0.0 05/22/2022 0437   EOSABS 0.1 06/19/2021 0917   BASOSABS 0.0 05/22/2022 0437  BASOSABS 0.0 06/19/2021 4540    Lab Results  Component Value Date   HGBA1C 5.9 (H) 04/27/2020    Assessment & Plan:  1. COPD exacerbation (HCC) Resolved - albuterol (VENTOLIN HFA) 108 (90 Base) MCG/ACT inhaler; Inhale 2 puffs into the lungs every 6 (six) hours as needed for wheezing or shortness of breath.  Dispense: 54 g; Refill: 3  2. Lipoma of neck - Ambulatory referral to General Surgery  3. Pedal edema Due to being out of Lasix for 2 days He will restart this once he gets home - Basic Metabolic Panel  4. Essential hypertension Controlled He discontinued valsartan due to the perception that it caused pedal edema He will monitor his blood pressures at home and if blood pressure is elevated we will need to place him on a different antihypertensive. Counseled on blood  pressure goal of less than 130/80, low-sodium, DASH diet, medication compliance, 150 minutes of moderate intensity exercise per week. Discussed medication compliance, adverse effects.  - Misc. Devices MISC; Blood pressure monitor. Diagnosis - Hypertension  Dispense: 1 each; Refill: 0  5. At increased risk for cardiovascular disease Continue with statin Risk factor modification    Meds ordered this encounter  Medications   albuterol (VENTOLIN HFA) 108 (90 Base) MCG/ACT inhaler    Sig: Inhale 2 puffs into the lungs every 6 (six) hours as needed for wheezing or shortness of breath.    Dispense:  54 g    Refill:  3   Misc. Devices MISC    Sig: Blood pressure monitor. Diagnosis - Hypertension    Dispense:  1 each    Refill:  0    Follow-up: Return in about 1 month (around 07/05/2022) for Blood Pressure follow-up.       Hoy Register, MD, FAAFP. [facility] [place], [place] [phone]   06/04/2022, 3:15 PM

## 2022-06-04 NOTE — Patient Instructions (Signed)
Edema ? ?Edema is when you have too much fluid in your body or under your skin. Edema may make your legs, feet, and ankles swell. Swelling often happens in looser tissues, such as around your eyes. This is a common condition. It gets more common as you get older. ?There are many possible causes of edema. These include: ?Eating too much salt (sodium). ?Being on your feet or sitting for a long time. ?Certain medical conditions, such as: ?Pregnancy. ?Heart failure. ?Liver disease. ?Kidney disease. ?Cancer. ?Hot weather may make edema worse. Edema is usually painless. Your skin may look swollen or shiny. ?Follow these instructions at home: ?Medicines ?Take over-the-counter and prescription medicines only as told by your doctor. ?Your doctor may prescribe a medicine to help your body get rid of extra water (diuretic). Take this medicine if you are told to take it. ?Eating and drinking ?Eat a low-salt (low-sodium) diet as told by your doctor. Sometimes, eating less salt may reduce swelling. ?Depending on the cause of your swelling, you may need to limit how much fluid you drink (fluid restriction). ?General instructions ?Raise the injured area above the level of your heart while you are sitting or lying down. ?Do not sit still or stand for a long time. ?Do not wear tight clothes. Do not wear garters on your upper legs. ?Exercise your legs. This can help the swelling go down. ?Wear compression stockings as told by your doctor. It is important that these are the right size. These should be prescribed by your doctor to prevent possible injuries. ?If elastic bandages or wraps are recommended, use them as told by your doctor. ?Contact a doctor if: ?Treatment is not working. ?You have heart, liver, or kidney disease and have symptoms of edema. ?You have sudden and unexplained weight gain. ?Get help right away if: ?You have shortness of breath or chest pain. ?You cannot breathe when you lie down. ?You have pain, redness, or  warmth in the swollen areas. ?You have heart, liver, or kidney disease and get edema all of a sudden. ?You have a fever and your symptoms get worse all of a sudden. ?These symptoms may be an emergency. Get help right away. Call 911. ?Do not wait to see if the symptoms will go away. ?Do not drive yourself to the hospital. ?Summary ?Edema is when you have too much fluid in your body or under your skin. ?Edema may make your legs, feet, and ankles swell. Swelling often happens in looser tissues, such as around your eyes. ?Raise the injured area above the level of your heart while you are sitting or lying down. ?Follow your doctor's instructions about diet and how much fluid you can drink. ?This information is not intended to replace advice given to you by your health care provider. Make sure you discuss any questions you have with your health care provider. ?Document Revised: 09/04/2020 Document Reviewed: 09/04/2020 ?Elsevier Patient Education ? 2023 Elsevier Inc. ? ?

## 2022-06-05 LAB — BASIC METABOLIC PANEL
BUN/Creatinine Ratio: 16 (ref 10–24)
BUN: 15 mg/dL (ref 8–27)
CO2: 27 mmol/L (ref 20–29)
Calcium: 8.6 mg/dL (ref 8.6–10.2)
Chloride: 101 mmol/L (ref 96–106)
Creatinine, Ser: 0.96 mg/dL (ref 0.76–1.27)
Glucose: 90 mg/dL (ref 70–99)
Potassium: 4 mmol/L (ref 3.5–5.2)
Sodium: 142 mmol/L (ref 134–144)
eGFR: 88 mL/min/{1.73_m2} (ref >59)

## 2022-06-10 ENCOUNTER — Other Ambulatory Visit: Payer: Self-pay

## 2022-06-11 ENCOUNTER — Other Ambulatory Visit: Payer: Self-pay

## 2022-06-12 ENCOUNTER — Other Ambulatory Visit: Payer: Self-pay

## 2022-06-18 ENCOUNTER — Ambulatory Visit: Payer: Self-pay

## 2022-06-18 NOTE — Patient Outreach (Signed)
  Care Coordination   Initial Visit Note   06/18/2022 Name: [name] MRN: [id number] DOB: May 22, 1955  [name] is a 67 y.o. year old male who sees Hoy Register, MD for primary care. I spoke with  Vilinda Blanks by phone today.  What matters to the patients health and wellness today?  Patient would like to quit smoking to help keep his COPD better controlled. Patient would like to have the "lump" on the back of his neck evaluated and biopsied.     Goals Addressed             This Visit's Progress    To follow up with General Surgery for evaluation of neck       Care Coordination Interventions: Evaluation of current treatment plan related to lipoma and patient's adherence to plan as established by provider Reviewed and discussed recent PCP referral for general surgery to evaluate potential lipoma located on patient's posterior neck Determined patient received some paperwork from the surgeons office for which he needs to complete, he has not received an appointment for initial consultation as of yet that he aware of      To follow up with Pulmonology for evaluation of COPD       Care Coordination Interventions: Provided patient with basic written and verbal COPD education on self care/management/and exacerbation prevention Advised patient to track and manage COPD triggers Provided instruction about proper use of medications used for management of COPD including inhalers Advised patient to self assesses COPD action plan zone and make appointment with provider if in the yellow zone for 48 hours without improvement Assessed social determinant of health barriers Reviewed and discussed upcoming scheduled Pulmonology follow up set for 06/27/22 @09 :00 AM    Interventions Today    Flowsheet Row Most Recent Value  Chronic Disease   Chronic disease during today's visit Chronic Obstructive Pulmonary Disease (COPD), Other  [lipoma of neck]  General Interventions    General Interventions Discussed/Reviewed General Interventions Discussed, General Interventions Reviewed, Doctor Visits  Doctor Visits Discussed/Reviewed Doctor Visits Discussed, Doctor Visits Reviewed, Specialist, PCP  Education Interventions   Education Provided Provided Education, Provided Printed Education  Provided Verbal Education On When to see the doctor  Pharmacy Interventions   Pharmacy Dicussed/Reviewed Pharmacy Topics Reviewed, Pharmacy Topics Discussed, Affording Medications          SDOH assessments and interventions completed:  No     Care Coordination Interventions:  Yes, provided   Follow up plan: Follow up call scheduled for 07/01/22 @09 :00 am    Encounter Outcome:  Pt. Visit Completed

## 2022-06-18 NOTE — Patient Instructions (Signed)
Visit Information  Thank you for taking time to visit with me today. Please don't hesitate to contact me if I can be of assistance to you.   Following are the goals we discussed today:   Goals Addressed             This Visit's Progress    To follow up with General Surgery for evaluation of neck       Care Coordination Interventions: Evaluation of current treatment plan related to lipoma and patient's adherence to plan as established by provider Reviewed and discussed recent PCP referral for general surgery to evaluate potential lipoma located on patient's posterior neck Determined patient received some paperwork from the surgeons office for which he needs to complete, he has not received an appointment for initial consultation as of yet that he aware of         To follow up with Pulmonology for evaluation of COPD       Care Coordination Interventions: Provided patient with basic written and verbal COPD education on self care/management/and exacerbation prevention Advised patient to track and manage COPD triggers Provided instruction about proper use of medications used for management of COPD including inhalers Advised patient to self assesses COPD action plan zone and make appointment with provider if in the yellow zone for 48 hours without improvement Assessed social determinant of health barriers Reviewed and discussed upcoming scheduled Pulmonology follow up set for 06/27/22 @09 :00 AM         Our next appointment is by telephone on 07/01/22 at 09:00 am  Please call the care guide team at [phone] if you need to cancel or reschedule your appointment.   If you are experiencing a Mental Health or Behavioral Health Crisis or need someone to talk to, please call 1-800-273-TALK (toll free, 24 hour hotline)  The patient verbalized understanding of instructions, educational materials, and care plan provided today and DECLINED offer to receive copy of patient instructions,  educational materials, and care plan.   Delsa Sale, RN, BSN, CCM Care Management Coordinator [facility] Care Management Direct Phone: [phone]

## 2022-06-27 ENCOUNTER — Other Ambulatory Visit: Payer: Self-pay

## 2022-06-27 ENCOUNTER — Encounter: Payer: Self-pay | Admitting: Pulmonary Disease

## 2022-06-27 ENCOUNTER — Ambulatory Visit (INDEPENDENT_AMBULATORY_CARE_PROVIDER_SITE_OTHER): Payer: Medicare HMO | Admitting: Pulmonary Disease

## 2022-06-27 VITALS — BP 134/82 | HR 88 | Ht 69.0 in | Wt 236.0 lb

## 2022-06-27 DIAGNOSIS — J449 Chronic obstructive pulmonary disease, unspecified: Secondary | ICD-10-CM | POA: Diagnosis not present

## 2022-06-27 DIAGNOSIS — F17210 Nicotine dependence, cigarettes, uncomplicated: Secondary | ICD-10-CM

## 2022-06-27 DIAGNOSIS — J432 Centrilobular emphysema: Secondary | ICD-10-CM | POA: Diagnosis not present

## 2022-06-27 MED ORDER — BUPROPION HCL ER (SR) 150 MG PO TB12
150.0000 mg | ORAL_TABLET | Freq: Two times a day (BID) | ORAL | 3 refills | Status: DC
Start: 2022-06-27 — End: 2022-10-08
  Filled 2022-06-27: qty 60, 30d supply, fill #0

## 2022-06-27 MED ORDER — BREZTRI AEROSPHERE 160-9-4.8 MCG/ACT IN AERO
2.0000 | INHALATION_SPRAY | Freq: Two times a day (BID) | RESPIRATORY_TRACT | 3 refills | Status: DC
Start: 2022-06-27 — End: 2023-08-25
  Filled 2022-06-27: qty 10.7, 30d supply, fill #0
  Filled 2022-12-18 – 2022-12-24 (×3): qty 10.7, 30d supply, fill #1

## 2022-06-27 NOTE — Progress Notes (Signed)
Synopsis: Referred in June 2024 for COPD exacerbation by [name], DO  Subjective:   PATIENT ID: [name] GENDER: male DOB: 1955/07/22, MRN: [id number]  HPI  Chief Complaint  Patient presents with   Hospitalization Follow-up    Was recently in the hospital for COPD. He had stopped smoking last month but started smoking again a few weeks ago due to stress.    [name] is a 67 year old male, daily smoker with hypertension who is referred to pulmonary clinic for COPD.   He was recently admitted 5/8 to 5/10 for COPD exacerbation with acute hypoxemic respiratory failure. He is using breztri inhaler 2 puffs twice daily and as needed albuterol.   He had quit smoking for a couple of weeks but then started back smoking 1-6 cigarettes per day after stressor from his family.   He works as a Therapist, nutritional. He noticed he had to stop and take some breaks for his breathing. He went for a 4 block walk where he had to stop 6 times to catch his breath. His grandfather had asthma and his son has asthma.  Past Medical History:  Diagnosis Date   Asthma    COPD (chronic obstructive pulmonary disease) (HCC)    Hx of adenomatous polyp of colon 01/13/2020   Hypertension      Family History  Problem Relation Age of Onset   Hypertension Paternal Uncle    Cancer Maternal Grandmother    Hypertension Maternal Grandfather    Colon cancer Cousin    Colon polyps Neg Hx    Esophageal cancer Neg Hx    Stomach cancer Neg Hx    Rectal cancer Neg Hx      Social History   Socioeconomic History   Marital status: Significant Other    Spouse name: Not on file   Number of children: Not on file   Years of education: Not on file   Highest education level: Not on file  Occupational History   Not on file  Tobacco Use   Smoking status: Some Days    Packs/day: 1.00    Years: 45.00    Additional pack years: 0.00    Total pack years: 45.00    Types: Cigarettes   Smokeless  tobacco: Never  Vaping Use   Vaping Use: Never used  Substance and Sexual Activity   Alcohol use: Yes    Alcohol/week: 1.0 standard drink of alcohol    Types: 1 Standard drinks or equivalent per week    Comment: "every once in awhile"-3 times a month per pt   Drug use: Yes    Frequency: 1.0 times per week    Types: Marijuana    Comment: occasionally marijuana-3 times a month per pt   Sexual activity: Not on file  Other Topics Concern   Not on file  Social History Narrative   Not on file   Social Determinants of Health   Financial Resource Strain: Medium Risk (05/06/2022)   Overall Financial Resource Strain (CARDIA)    Difficulty of Paying Living Expenses: Somewhat hard  Food Insecurity: No Food Insecurity (05/22/2022)   Hunger Vital Sign    Worried About Running Out of Food in the Last Year: Never true    Ran Out of Food in the Last Year: Never true  Transportation Needs: No Transportation Needs (05/22/2022)   PRAPARE - Administrator, Civil Service (Medical): No    Lack of Transportation (Non-Medical): No  Physical  Activity: Inactive (05/06/2022)   Exercise Vital Sign    Days of Exercise per Week: 0 days    Minutes of Exercise per Session: 0 min  Stress: No Stress Concern Present (05/06/2022)   [employer] of Occupational Health - Occupational Stress Questionnaire    Feeling of Stress : Only a little  Social Connections: Moderately Integrated (05/06/2022)   Social Connection and Isolation Panel [NHANES]    Frequency of Communication with Friends and Family: More than three times a week    Frequency of Social Gatherings with Friends and Family: More than three times a week    Attends Religious Services: 1 to 4 times per year    Active Member of [employer] or Organizations: No    Attends Banker Meetings: Never    Marital Status: Living with partner  Intimate Partner Violence: Not At Risk (05/22/2022)   Humiliation, Afraid, Rape, and Kick questionnaire     Fear of Current or Ex-Partner: No    Emotionally Abused: No    Physically Abused: No    Sexually Abused: No     Allergies  Allergen Reactions   Codeine Itching   Penicillins Itching and Other (See Comments)     Outpatient Medications Prior to Visit  Medication Sig Dispense Refill   albuterol (VENTOLIN HFA) 108 (90 Base) MCG/ACT inhaler Inhale 2 puffs into the lungs every 6 (six) hours as needed for wheezing or shortness of breath. 54 g 3   atorvastatin (LIPITOR) 20 MG tablet Take 1 tablet (20 mg total) by mouth daily. 90 tablet 1   furosemide (LASIX) 40 MG tablet Take 1 tablet (40 mg total) by mouth daily. 90 tablet 2   ipratropium-albuterol (DUONEB) 0.5-2.5 (3) MG/3ML SOLN Take 3 mLs by nebulization every 6 (six) hours as needed (severe wheezing). 1080 mL 0   Misc. Devices MISC Nebulizer device.  Diagnosis COPD 1 each 0   Misc. Devices MISC Blood pressure monitor. Diagnosis - Hypertension 1 each 0   Budeson-Glycopyrrol-Formoterol (BREZTRI AEROSPHERE) 160-9-4.8 MCG/ACT AERO Inhale 2 puffs into the lungs 2 (two) times daily. 10.7 g 3   No facility-administered medications prior to visit.    Review of Systems  Constitutional:  Negative for chills, fever, malaise/fatigue and weight loss.  HENT:  Negative for congestion, sinus pain and sore throat.   Eyes: Negative.   Respiratory:  Positive for cough, sputum production, shortness of breath and wheezing. Negative for hemoptysis.   Cardiovascular:  Negative for chest pain, palpitations, orthopnea, claudication and leg swelling.  Gastrointestinal:  Negative for abdominal pain, heartburn, nausea and vomiting.  Genitourinary: Negative.   Musculoskeletal:  Negative for joint pain and myalgias.  Skin:  Negative for rash.  Neurological:  Negative for weakness.  Endo/Heme/Allergies: Negative.   Psychiatric/Behavioral: Negative.        Objective:   Vitals:   06/27/22 0900  BP: 134/82  Pulse: 88  SpO2: 97%  Weight: 236 lb (107 kg)   Height: 5\' 9"  (1.753 m)     Physical Exam Constitutional:      General: He is not in acute distress. HENT:     Head: Normocephalic and atraumatic.  Eyes:     Conjunctiva/sclera: Conjunctivae normal.  Cardiovascular:     Rate and Rhythm: Normal rate and regular rhythm.     Pulses: Normal pulses.     Heart sounds: Normal heart sounds. No murmur heard. Pulmonary:     Effort: Pulmonary effort is normal.     Breath sounds: No wheezing,  rhonchi or rales.  Musculoskeletal:     Right lower leg: No edema.     Left lower leg: No edema.  Skin:    General: Skin is warm and dry.  Neurological:     General: No focal deficit present.     Mental Status: He is alert.     CBC    Component Value Date/Time   WBC 7.5 05/22/2022 0437   RBC 4.33 05/22/2022 0437   HGB 13.9 05/22/2022 0437   HGB 15.1 06/19/2021 0917   HCT 42.8 05/22/2022 0437   HCT 44.4 06/19/2021 0917   PLT 158 05/22/2022 0437   PLT 179 06/19/2021 0917   MCV 98.8 05/22/2022 0437   MCV 93 06/19/2021 0917   MCH 32.1 05/22/2022 0437   MCHC 32.5 05/22/2022 0437   RDW 14.5 05/22/2022 0437   RDW 13.2 06/19/2021 0917   LYMPHSABS 0.3 (L) 05/22/2022 0437   LYMPHSABS 1.6 06/19/2021 0917   MONOABS 0.1 05/22/2022 0437   EOSABS 0.0 05/22/2022 0437   EOSABS 0.1 06/19/2021 0917   BASOSABS 0.0 05/22/2022 0437   BASOSABS 0.0 06/19/2021 0917      Latest Ref Rng & Units 06/04/2022    5:30 PM 05/22/2022    4:37 AM 05/21/2022    7:39 PM  BMP  Glucose 70 - 99 mg/dL 90  295  621   BUN 8 - 27 mg/dL 15  15  12    Creatinine 0.76 - 1.27 mg/dL 3.08  6.57  8.46   BUN/Creat Ratio 10 - 24 16     Sodium 134 - 144 mmol/L 142  137  139   Potassium 3.5 - 5.2 mmol/L 4.0  4.1  3.7   Chloride 96 - 106 mmol/L 101  101  99   CO2 20 - 29 mmol/L 27  32  34   Calcium 8.6 - 10.2 mg/dL 8.6  7.9  8.5    Chest imaging: CTA Chest 05/21/22 Cardiovascular: Satisfactory opacification of the pulmonary arteries to the segmental level. No pulmonary embolism.  Normal heart size. Coronary artery and aortic atherosclerotic calcification. No pericardial effusion.   Mediastinum/Nodes: Unremarkable esophagus.  No thoracic adenopathy.   Lungs/Pleura: Mild emphysema in the upper lungs. Diffuse bronchial wall thickening with mucous plugging greatest in the lower lobes. Bilateral centrilobular micro nodules and tree-in-bud opacities greatest in the lower lobes. Subpleural focal consolidation in the medial left lower lobe. No pleural effusion or pneumothorax. The   PFT:     No data to display          Labs:  Path:  Echo: 2024: LV EF 60-65%. The Grade I diastolic dysfunction. RV size and systolic function is normal. Mildly elevated PASP.   Heart Catheterization:    Assessment & Plan:   Centrilobular emphysema (HCC) - Plan: Budeson-Glycopyrrol-Formoterol (BREZTRI AEROSPHERE) 160-9-4.8 MCG/ACT AERO, Pulmonary Function Test  COPD mixed type (HCC)  Cigarette smoker - Plan: buPROPion (WELLBUTRIN SR) 150 MG 12 hr tablet  Discussion: [name] is a 67 year old male, daily smoker with hypertension who is referred to pulmonary clinic for COPD.   He has centrilobular emphysema noted on his recent CTA Chest scan.   He is to continue breztri inhaler 2 puffs twice daily and as needed albuterol.   We discussed smoking cessation for 3 minutes. Recommended using 7mg  nicotine patches daily and as needed mini nicotine lozenges. He is to start wellbutrin 150mg  daily for 7 days then increase to twice daily dosing.   Follow up in  3 months with pulmonary function testing.  Melody Comas, MD [facility] Office: [phone]   Current Outpatient Medications:    albuterol (VENTOLIN HFA) 108 (90 Base) MCG/ACT inhaler, Inhale 2 puffs into the lungs every 6 (six) hours as needed for wheezing or shortness of breath., Disp: 54 g, Rfl: 3   atorvastatin (LIPITOR) 20 MG tablet, Take 1 tablet (20 mg total) by mouth daily., Disp: 90  tablet, Rfl: 1   buPROPion (WELLBUTRIN SR) 150 MG 12 hr tablet, Take 1 tablet (150 mg total) by mouth 2 (two) times daily., Disp: 60 tablet, Rfl: 3   furosemide (LASIX) 40 MG tablet, Take 1 tablet (40 mg total) by mouth daily., Disp: 90 tablet, Rfl: 2   ipratropium-albuterol (DUONEB) 0.5-2.5 (3) MG/3ML SOLN, Take 3 mLs by nebulization every 6 (six) hours as needed (severe wheezing)., Disp: 1080 mL, Rfl: 0   Misc. Devices MISC, Nebulizer device.  Diagnosis COPD, Disp: 1 each, Rfl: 0   Misc. Devices MISC, Blood pressure monitor. Diagnosis - Hypertension, Disp: 1 each, Rfl: 0   Budeson-Glycopyrrol-Formoterol (BREZTRI AEROSPHERE) 160-9-4.8 MCG/ACT AERO, Inhale 2 puffs into the lungs 2 (two) times daily., Disp: 10.7 g, Rfl: 3

## 2022-06-27 NOTE — Patient Instructions (Addendum)
Continue breztri inhaler 2 puffs twice daily - rinse mouth out after each use  Use albuterol inhaler 1-2 puffs every 4-6 hours as needed  Recommend smoking cessation with nicotine replacement therapy - use 7mg  nicotine patches daily - use mini nicotine lozenges or nicotine gum as needed  Call 1-800-quit-NOW to get free nicotine replacement and counseling from the state of [facility] wellbutrin 150mg  daily for 7 days then take 150mg  tablet twice daily  Follow up in 3 months with pulmonary function tests

## 2022-07-01 ENCOUNTER — Ambulatory Visit: Payer: Self-pay

## 2022-07-01 NOTE — Patient Instructions (Signed)
Visit Information  Thank you for taking time to visit with me today. Please don't hesitate to contact me if I can be of assistance to you.   Following are the goals we discussed today:   Goals Addressed             This Visit's Progress    To follow up with Pulmonology for evaluation of COPD       Care Coordination Interventions: Evaluation of current treatment plan related to COPD  and patient's adherence to plan as established by provider Determined patient completed his initial follow up with Pulmonologist, Dr. Francine Graven for evaluation of COPD  Review of patient status, including review of consultant's reports, relevant laboratory and other test results, and medications completed Advised patient to track and manage COPD triggers Provided instruction about proper use of medications used for management of COPD including inhalers Advised patient to self assesses COPD action plan zone and make appointment with provider if in the yellow zone for 48 hours without improvement Provided education about and advised patient to utilize infection prevention strategies to reduce risk of respiratory infection Reviewed next scheduled Pulmonology appointment with patient to undergo PFT, set for 09/30/22 @09 :30 AM         Our next appointment is by telephone on 10/10/22 @09 :00 AM   Please call the care guide team at [phone] if you need to cancel or reschedule your appointment.   If you are experiencing a Mental Health or Behavioral Health Crisis or need someone to talk to, please call 1-800-273-TALK (toll free, 24 hour hotline)  The patient verbalized understanding of instructions, educational materials, and care plan provided today and DECLINED offer to receive copy of patient instructions, educational materials, and care plan.   Delsa Sale, RN, BSN, CCM Care Management Coordinator [facility] Care Management  Direct Phone: [phone]

## 2022-07-01 NOTE — Patient Outreach (Signed)
  Care Coordination   Follow Up Visit Note   07/01/2022 Name: Vonnie Prout MRN: [id number] DOB: 12-27-1955  Cathie Olden Boettcher is a 67 y.o. year old male who sees Hoy Register, MD for primary care. I spoke with  Vilinda Blanks by phone today.  What matters to the patients health and wellness today?  Patient would like to work on quitting smoking to help keep his COPD under better control.     Goals Addressed             This Visit's Progress    To follow up with Pulmonology for evaluation of COPD       Care Coordination Interventions: Evaluation of current treatment plan related to COPD  and patient's adherence to plan as established by provider Determined patient completed his initial follow up with Pulmonologist, Dr. Francine Graven for evaluation of COPD  Review of patient status, including review of consultant's reports, relevant laboratory and other test results, and medications completed Advised patient to track and manage COPD triggers Provided instruction about proper use of medications used for management of COPD including inhalers Advised patient to self assesses COPD action plan zone and make appointment with provider if in the yellow zone for 48 hours without improvement Provided education about and advised patient to utilize infection prevention strategies to reduce risk of respiratory infection Reviewed next scheduled Pulmonology appointment with patient to undergo PFT, set for 09/30/22 @09 :30 AM    Interventions Today    Flowsheet Row Most Recent Value  Chronic Disease   Chronic disease during today's visit Chronic Obstructive Pulmonary Disease (COPD)  General Interventions   General Interventions Discussed/Reviewed General Interventions Discussed, General Interventions Reviewed, Doctor Visits  Doctor Visits Discussed/Reviewed Doctor Visits Reviewed, Doctor Visits Discussed, Specialist  Education Interventions   Education Provided Provided Education,  Provided Printed Education  Provided Verbal Education On When to see the doctor, Medication  Mental Health Interventions   Mental Health Discussed/Reviewed Mental Health Discussed, Mental Health Reviewed, Anxiety, Other  [smoking cessation]  Pharmacy Interventions   Pharmacy Dicussed/Reviewed Pharmacy Topics Discussed, Pharmacy Topics Reviewed, Medications and their functions          SDOH assessments and interventions completed:  No     Care Coordination Interventions:  Yes, provided   Follow up plan: Follow up call scheduled for 10/10/22 @09 :00 AM    Encounter Outcome:  Pt. Visit Completed

## 2022-07-29 ENCOUNTER — Ambulatory Visit: Payer: Medicare HMO | Admitting: Family Medicine

## 2022-08-01 ENCOUNTER — Ambulatory Visit: Payer: Medicaid Other | Admitting: Family Medicine

## 2022-08-16 ENCOUNTER — Other Ambulatory Visit: Payer: Self-pay

## 2022-08-19 ENCOUNTER — Other Ambulatory Visit: Payer: Self-pay

## 2022-08-20 ENCOUNTER — Other Ambulatory Visit: Payer: Self-pay

## 2022-08-22 ENCOUNTER — Other Ambulatory Visit: Payer: Self-pay

## 2022-09-27 ENCOUNTER — Telehealth: Payer: Self-pay

## 2022-09-27 NOTE — Telephone Encounter (Signed)
Copied from CRM [id number]. Topic: General - Inquiry >> Sep 25, 2022  4:20 PM Yolanda T wrote: Reason for CRM: patient called to get his AWV appt set up with Helmut Muster. Please give a call back

## 2022-09-30 ENCOUNTER — Encounter (HOSPITAL_BASED_OUTPATIENT_CLINIC_OR_DEPARTMENT_OTHER): Payer: Medicare HMO

## 2022-09-30 ENCOUNTER — Encounter (HOSPITAL_BASED_OUTPATIENT_CLINIC_OR_DEPARTMENT_OTHER): Payer: Self-pay | Admitting: Pulmonary Disease

## 2022-10-08 ENCOUNTER — Encounter: Payer: Self-pay | Admitting: Pulmonary Disease

## 2022-10-08 ENCOUNTER — Ambulatory Visit (INDEPENDENT_AMBULATORY_CARE_PROVIDER_SITE_OTHER): Payer: Medicare HMO | Admitting: Pulmonary Disease

## 2022-10-08 ENCOUNTER — Other Ambulatory Visit: Payer: Self-pay

## 2022-10-08 VITALS — BP 144/70 | HR 90 | Temp 98.4°F | Ht 69.0 in | Wt 231.0 lb

## 2022-10-08 DIAGNOSIS — J441 Chronic obstructive pulmonary disease with (acute) exacerbation: Secondary | ICD-10-CM

## 2022-10-08 DIAGNOSIS — F17210 Nicotine dependence, cigarettes, uncomplicated: Secondary | ICD-10-CM | POA: Diagnosis not present

## 2022-10-08 MED ORDER — PREDNISONE 10 MG PO TABS
ORAL_TABLET | ORAL | 0 refills | Status: AC
Start: 2022-10-08 — End: 2022-10-20
  Filled 2022-10-08: qty 30, 12d supply, fill #0

## 2022-10-08 MED ORDER — AZITHROMYCIN 250 MG PO TABS
ORAL_TABLET | ORAL | 0 refills | Status: AC
Start: 2022-10-08 — End: 2022-10-13
  Filled 2022-10-08: qty 6, 5d supply, fill #0

## 2022-10-08 MED ORDER — ALBUTEROL SULFATE HFA 108 (90 BASE) MCG/ACT IN AERS
2.0000 | INHALATION_SPRAY | Freq: Four times a day (QID) | RESPIRATORY_TRACT | 11 refills | Status: DC | PRN
Start: 2022-10-08 — End: 2023-09-17
  Filled 2022-10-08: qty 18, 25d supply, fill #0
  Filled 2022-10-08 – 2022-11-04 (×2): qty 54, 75d supply, fill #0
  Filled 2023-01-16: qty 54, 75d supply, fill #1
  Filled 2023-03-13 – 2023-07-16 (×2): qty 54, 75d supply, fill #2
  Filled 2023-08-25: qty 54, 75d supply, fill #3

## 2022-10-08 NOTE — Patient Instructions (Addendum)
We will treat you for COPD exacerbation with prednisone taper and Z pak  Continue breztri inhaler 2 puffs twice daily - rinse mouth out after each use  Use albuterol inhaler 1-2 puffs every 4-6 hours as needed  Recommend smoking cessation with nicotine replacement therapy - use 14mg  nicotine patches daily - use mini nicotine lozenges or nicotine gum as needed   Call 1-800-quit-NOW to get free nicotine replacement and counseling from the state of [place]   Follow up in 6 months with breathing tests

## 2022-10-08 NOTE — Progress Notes (Signed)
Synopsis: Referred in June 2024 for COPD exacerbation by [name], DO  Subjective:   PATIENT ID: [name] GENDER: male DOB: 1955-12-10, MRN: [id number]  HPI  Chief Complaint  Patient presents with   Follow-up    Breathing has been slightly worse since the last visit. He is smoking 1 ppd currently. He has a prod cough with white to green sputum.    [name] is a 67 year old male, daily smoker with hypertension who is referred to pulmonary clinic for COPD.   Initial OV 06/27/22 He was recently admitted 5/8 to 5/10 for COPD exacerbation with acute hypoxemic respiratory failure. He is using breztri inhaler 2 puffs twice daily and as needed albuterol.   He had quit smoking for a couple of weeks but then started back smoking 1-6 cigarettes per day after stressor from his family.   He works as a Therapist, nutritional. He noticed he had to stop and take some breaks for his breathing. He went for a 4 block walk where he had to stop 6 times to catch his breath. His grandfather had asthma and his son has asthma.  Today OV 10/08/22 He tried Wellbutrin but he had GI issues and it made his nerves un-easy. He continues to smoke a pack per day.   He reports improvement in his breathing with breztri but is currently having issues with progressive dyspnea and wheezing over past couple of weeks.    Past Medical History:  Diagnosis Date   Asthma    COPD (chronic obstructive pulmonary disease) (HCC)    Hx of adenomatous polyp of colon 01/13/2020   Hypertension      Family History  Problem Relation Age of Onset   Hypertension Paternal Uncle    Cancer Maternal Grandmother    Hypertension Maternal Grandfather    Colon cancer Cousin    Colon polyps Neg Hx    Esophageal cancer Neg Hx    Stomach cancer Neg Hx    Rectal cancer Neg Hx      Social History   Socioeconomic History   Marital status: Significant Other    Spouse name: Not on file   Number of children: Not  on file   Years of education: Not on file   Highest education level: Not on file  Occupational History   Not on file  Tobacco Use   Smoking status: Every Day    Current packs/day: 1.00    Average packs/day: 1 pack/day for 45.0 years (45.0 ttl pk-yrs)    Types: Cigarettes   Smokeless tobacco: Never  Vaping Use   Vaping status: Never Used  Substance and Sexual Activity   Alcohol use: Yes    Alcohol/week: 1.0 standard drink of alcohol    Types: 1 Standard drinks or equivalent per week    Comment: "every once in awhile"-3 times a month per pt   Drug use: Yes    Frequency: 1.0 times per week    Types: Marijuana    Comment: occasionally marijuana-3 times a month per pt   Sexual activity: Not on file  Other Topics Concern   Not on file  Social History Narrative   Not on file   Social Determinants of Health   Financial Resource Strain: Medium Risk (05/06/2022)   Overall Financial Resource Strain (CARDIA)    Difficulty of Paying Living Expenses: Somewhat hard  Food Insecurity: No Food Insecurity (05/22/2022)   Hunger Vital Sign    Worried About Running Out  of Food in the Last Year: Never true    Ran Out of Food in the Last Year: Never true  Transportation Needs: No Transportation Needs (05/22/2022)   PRAPARE - Administrator, Civil Service (Medical): No    Lack of Transportation (Non-Medical): No  Physical Activity: Inactive (05/06/2022)   Exercise Vital Sign    Days of Exercise per Week: 0 days    Minutes of Exercise per Session: 0 min  Stress: No Stress Concern Present (05/06/2022)   [employer] of Occupational Health - Occupational Stress Questionnaire    Feeling of Stress : Only a little  Social Connections: Moderately Integrated (05/06/2022)   Social Connection and Isolation Panel [NHANES]    Frequency of Communication with Friends and Family: More than three times a week    Frequency of Social Gatherings with Friends and Family: More than three times a week     Attends Religious Services: 1 to 4 times per year    Active Member of [employer] or Organizations: No    Attends Banker Meetings: Never    Marital Status: Living with partner  Intimate Partner Violence: Not At Risk (05/22/2022)   Humiliation, Afraid, Rape, and Kick questionnaire    Fear of Current or Ex-Partner: No    Emotionally Abused: No    Physically Abused: No    Sexually Abused: No     Allergies  Allergen Reactions   Codeine Itching   Penicillins Itching and Other (See Comments)     Outpatient Medications Prior to Visit  Medication Sig Dispense Refill   atorvastatin (LIPITOR) 20 MG tablet Take 1 tablet (20 mg total) by mouth daily. (Patient not taking: Reported on 10/10/2022) 90 tablet 1   Budeson-Glycopyrrol-Formoterol (BREZTRI AEROSPHERE) 160-9-4.8 MCG/ACT AERO Inhale 2 puffs into the lungs 2 (two) times daily. 10.7 g 3   ipratropium-albuterol (DUONEB) 0.5-2.5 (3) MG/3ML SOLN Take 3 mLs by nebulization every 6 (six) hours as needed (severe wheezing). 1080 mL 0   Misc. Devices MISC Nebulizer device.  Diagnosis COPD 1 each 0   Misc. Devices MISC Blood pressure monitor. Diagnosis - Hypertension (Patient not taking: Reported on 10/10/2022) 1 each 0   albuterol (VENTOLIN HFA) 108 (90 Base) MCG/ACT inhaler Inhale 2 puffs into the lungs every 6 (six) hours as needed for wheezing or shortness of breath. 54 g 3   furosemide (LASIX) 40 MG tablet Take 1 tablet (40 mg total) by mouth daily. (Patient not taking: Reported on 10/08/2022) 90 tablet 2   buPROPion (WELLBUTRIN SR) 150 MG 12 hr tablet Take 1 tablet (150 mg total) by mouth 2 (two) times daily. 60 tablet 3   No facility-administered medications prior to visit.   Review of Systems  Constitutional:  Negative for chills, fever, malaise/fatigue and weight loss.  HENT:  Negative for congestion, sinus pain and sore throat.   Eyes: Negative.   Respiratory:  Positive for cough, sputum production, shortness of breath and wheezing.  Negative for hemoptysis.   Cardiovascular:  Negative for chest pain, palpitations, orthopnea, claudication and leg swelling.  Gastrointestinal:  Negative for abdominal pain, heartburn, nausea and vomiting.  Genitourinary: Negative.   Musculoskeletal:  Negative for joint pain and myalgias.  Skin:  Negative for rash.  Neurological:  Negative for weakness.  Endo/Heme/Allergies: Negative.   Psychiatric/Behavioral: Negative.     Objective:   Vitals:   10/08/22 0858  BP: (!) 144/70  Pulse: 90  Temp: 98.4 F (36.9 C)  TempSrc: Oral  SpO2: 95%  Weight: 231 lb (104.8 kg)  Height: 5\' 9"  (1.753 m)   Physical Exam Constitutional:      General: He is not in acute distress. HENT:     Head: Normocephalic and atraumatic.  Eyes:     Conjunctiva/sclera: Conjunctivae normal.  Cardiovascular:     Rate and Rhythm: Normal rate and regular rhythm.     Pulses: Normal pulses.     Heart sounds: Normal heart sounds. No murmur heard. Pulmonary:     Effort: Pulmonary effort is normal.     Breath sounds: No wheezing, rhonchi or rales.  Musculoskeletal:     Right lower leg: No edema.     Left lower leg: No edema.  Skin:    General: Skin is warm and dry.  Neurological:     General: No focal deficit present.     Mental Status: He is alert.    CBC    Component Value Date/Time   WBC 7.5 05/22/2022 0437   RBC 4.33 05/22/2022 0437   HGB 13.9 05/22/2022 0437   HGB 15.1 06/19/2021 0917   HCT 42.8 05/22/2022 0437   HCT 44.4 06/19/2021 0917   PLT 158 05/22/2022 0437   PLT 179 06/19/2021 0917   MCV 98.8 05/22/2022 0437   MCV 93 06/19/2021 0917   MCH 32.1 05/22/2022 0437   MCHC 32.5 05/22/2022 0437   RDW 14.5 05/22/2022 0437   RDW 13.2 06/19/2021 0917   LYMPHSABS 0.3 (L) 05/22/2022 0437   LYMPHSABS 1.6 06/19/2021 0917   MONOABS 0.1 05/22/2022 0437   EOSABS 0.0 05/22/2022 0437   EOSABS 0.1 06/19/2021 0917   BASOSABS 0.0 05/22/2022 0437   BASOSABS 0.0 06/19/2021 0917      Latest Ref Rng &  Units 06/04/2022    5:30 PM 05/22/2022    4:37 AM 05/21/2022    7:39 PM  BMP  Glucose 70 - 99 mg/dL 90  086  578   BUN 8 - 27 mg/dL 15  15  12    Creatinine 0.76 - 1.27 mg/dL 4.69  6.29  5.28   BUN/Creat Ratio 10 - 24 16     Sodium 134 - 144 mmol/L 142  137  139   Potassium 3.5 - 5.2 mmol/L 4.0  4.1  3.7   Chloride 96 - 106 mmol/L 101  101  99   CO2 20 - 29 mmol/L 27  32  34   Calcium 8.6 - 10.2 mg/dL 8.6  7.9  8.5    Chest imaging: CTA Chest 05/21/22 Cardiovascular: Satisfactory opacification of the pulmonary arteries to the segmental level. No pulmonary embolism. Normal heart size. Coronary artery and aortic atherosclerotic calcification. No pericardial effusion.   Mediastinum/Nodes: Unremarkable esophagus.  No thoracic adenopathy.   Lungs/Pleura: Mild emphysema in the upper lungs. Diffuse bronchial wall thickening with mucous plugging greatest in the lower lobes. Bilateral centrilobular micro nodules and tree-in-bud opacities greatest in the lower lobes. Subpleural focal consolidation in the medial left lower lobe. No pleural effusion or pneumothorax. The   PFT:     No data to display          Labs:  Path:  Echo: 2024: LV EF 60-65%. The Grade I diastolic dysfunction. RV size and systolic function is normal. Mildly elevated PASP.   Heart Catheterization:    Assessment & Plan:   COPD with acute exacerbation (HCC) - Plan: predniSONE (DELTASONE) 10 MG tablet, azithromycin (ZITHROMAX) 250 MG tablet  COPD exacerbation (HCC) - Plan: albuterol (VENTOLIN HFA) 108 (90 Base)  MCG/ACT inhaler  Discussion: Yosgar Weisenberg is a 67 year old male, daily smoker with hypertension who returns to pulmonary clinic for COPD.   He is to continue breztri inhaler 2 puffs twice daily and as needed albuterol.   We discussed smoking cessation for 3 minutes. Recommended using 14mg  nicotine patches daily and as needed mini nicotine lozenges. He did not tolerate wellbutrin in the past.  Follow  up in 6 months.  Melody Comas, MD [facility] Office: [phone]   Current Outpatient Medications:    atorvastatin (LIPITOR) 20 MG tablet, Take 1 tablet (20 mg total) by mouth daily. (Patient not taking: Reported on 10/10/2022), Disp: 90 tablet, Rfl: 1   Budeson-Glycopyrrol-Formoterol (BREZTRI AEROSPHERE) 160-9-4.8 MCG/ACT AERO, Inhale 2 puffs into the lungs 2 (two) times daily., Disp: 10.7 g, Rfl: 3   ipratropium-albuterol (DUONEB) 0.5-2.5 (3) MG/3ML SOLN, Take 3 mLs by nebulization every 6 (six) hours as needed (severe wheezing)., Disp: 1080 mL, Rfl: 0   Misc. Devices MISC, Nebulizer device.  Diagnosis COPD, Disp: 1 each, Rfl: 0   Misc. Devices MISC, Blood pressure monitor. Diagnosis - Hypertension (Patient not taking: Reported on 10/10/2022), Disp: 1 each, Rfl: 0   predniSONE (DELTASONE) 10 MG tablet, Take 4 tablets (40 mg total) by mouth daily with breakfast for 3 days, THEN 3 tablets (30 mg total) daily with breakfast for 3 days, THEN 2 tablets (20 mg total) daily with breakfast for 3 days, THEN 1 tablet (10 mg total) daily with breakfast for 3 days., Disp: 30 tablet, Rfl: 0   albuterol (VENTOLIN HFA) 108 (90 Base) MCG/ACT inhaler, Inhale 2 puffs into the lungs every 6 (six) hours as needed for wheezing or shortness of breath., Disp: 54 g, Rfl: 11   furosemide (LASIX) 40 MG tablet, Take 1 tablet (40 mg total) by mouth daily. (Patient not taking: Reported on 10/08/2022), Disp: 90 tablet, Rfl: 2   tiZANidine (ZANAFLEX) 4 MG tablet, Take 1 tablet (4 mg total) by mouth every 8 (eight) hours as needed., Disp: 60 tablet, Rfl: 1

## 2022-10-10 ENCOUNTER — Encounter: Payer: Self-pay | Admitting: Family Medicine

## 2022-10-10 ENCOUNTER — Ambulatory Visit: Payer: Medicare HMO | Attending: Family Medicine | Admitting: Family Medicine

## 2022-10-10 ENCOUNTER — Ambulatory Visit: Payer: Self-pay

## 2022-10-10 ENCOUNTER — Other Ambulatory Visit: Payer: Self-pay

## 2022-10-10 VITALS — BP 175/75 | HR 89 | Temp 98.5°F | Ht 69.0 in | Wt 232.0 lb

## 2022-10-10 DIAGNOSIS — M5431 Sciatica, right side: Secondary | ICD-10-CM

## 2022-10-10 DIAGNOSIS — Z0000 Encounter for general adult medical examination without abnormal findings: Secondary | ICD-10-CM

## 2022-10-10 DIAGNOSIS — I10 Essential (primary) hypertension: Secondary | ICD-10-CM | POA: Diagnosis not present

## 2022-10-10 DIAGNOSIS — Z5320 Procedure and treatment not carried out because of patient's decision for unspecified reasons: Secondary | ICD-10-CM | POA: Insufficient documentation

## 2022-10-10 MED ORDER — TIZANIDINE HCL 4 MG PO TABS
4.0000 mg | ORAL_TABLET | Freq: Three times a day (TID) | ORAL | 1 refills | Status: DC | PRN
  Filled 2022-10-10: qty 60, 20d supply, fill #0

## 2022-10-10 NOTE — Patient Instructions (Addendum)
Visit Information  Thank you for taking time to visit with me today. Please don't hesitate to contact me if I can be of assistance to you.   Following are the goals we discussed today:   Goals Addressed             This Visit's Progress    To follow up with Pulmonology for evaluation of COPD   On track    Care Coordination Interventions: Evaluation of current treatment plan related to COPD  and patient's adherence to plan as established by provider Determined patient completed his follow up with Pulmonologist, Dr. Francine Graven for evaluation of COPD  Review of patient status, including review of consultant's reports, relevant laboratory and other test results, and medications completed Provided education about and advised patient to utilize infection prevention strategies to reduce risk of respiratory infection Educated patient regarding options for smoking cessation, he will try the nicotine throat lozenges  Instructed patient to notify his doctor promptly of new symptoms or concerns         Our next appointment is by telephone on 12/10/22 at 10:30 AM  Please call the care guide team at [phone] if you need to cancel or reschedule your appointment.   If you are experiencing a Mental Health or Behavioral Health Crisis or need someone to talk to, please call 1-800-273-TALK (toll free, 24 hour hotline)  Patient verbalizes understanding of instructions and care plan provided today and agrees to view in MyChart. Active MyChart status and patient understanding of how to access instructions and care plan via MyChart confirmed with patient.     Delsa Sale RN BSN CCM [place]  [facility] Nurse Care Coordinator  Direct Dial: [phone] Website: [email or web address]

## 2022-10-10 NOTE — Patient Instructions (Signed)
  [name] , Thank you for taking time to come for your Medicare Wellness Visit. I appreciate your ongoing commitment to your health goals. Please review the following plan we discussed and let me know if I can assist you in the future.   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 Francine Graven for evaluation of COPD  Review of patient status, including review of consultant's reports, relevant laboratory and other test results, and medications completed Provided education about and advised patient to utilize infection prevention strategies to reduce risk of respiratory infection Educated patient regarding options for smoking cessation, he will try the nicotine throat lozenges  Instructed patient to notify his doctor promptly of new symptoms or concerns              This is a list of the screening recommended for you and due dates:  Health Maintenance  Topic Date Due   COVID-19 Vaccine (1) 10/26/2022*   Zoster (Shingles) Vaccine (1 of 2) 01/09/2023*   Pneumonia Vaccine (2 of 2 - PCV) 01/31/2023*   Flu Shot  04/14/2023*   Screening for Lung Cancer  05/21/2023   Medicare Annual Wellness Visit   10/10/2023   DTaP/Tdap/Td vaccine (2 - Td or Tdap) 03/12/2026   Colon Cancer Screening  01/10/2027   Hepatitis C Screening  Completed   HIV Screening  Completed   HPV Vaccine  Aged Out  *Topic was postponed. The date shown is not the original due date.

## 2022-10-10 NOTE — Progress Notes (Signed)
Subjective:    [name] is a 67 y.o. male who presents for a Welcome to Medicare exam.   Cardiac Risk Factors include: advanced age (>50men, >88 women);male gender  Discussed the use of AI scribe software for clinical note transcription with the patient, who gave verbal consent to proceed.   The patient presents with high blood pressure and leg pain. He acknowledges his high blood pressure but refuses to take medication for it, citing poor adherence to medication regimens. He attributes his hypertension to stress and smoking, and admits to poor eating habits. He is currently on medication for COPD, which he takes regularly as it aids his breathing. He is trying to quit smoking and has reduced his daily cigarette intake from a pack to about ten, with a goal to further reduce to five. He is using nicotine gum to aid in smoking cessation, but has had a negative experience with nicotine patches causing skin irritation.  The patient also reports sharp right leg pain that has been ongoing for about three weeks. He has been using a cane for support and has not been taking any pain medication. He reports occasional numbness in the affected leg.  He has not been taking any pain medication for it, but is open to taking medication until the pain subsides. He also expresses interest in physical therapy for his leg pain.  The patient has seen a cardiologist once but did not continue due to a disagreement. He has not received a pneumonia vaccine and is not open to receiving one, citing a distrust in the concept of vaccines. He also expresses a preference for home remedies, such as moonshine, honey, and hard rock candy, for illness prevention.         Objective:    Today's Vitals   10/10/22 0958  BP: (!) 175/75  Pulse: 89  Temp: 98.5 F (36.9 C)  TempSrc: Oral  SpO2: 96%  Weight: 232 lb (105.2 kg)  Height: 5\' 9"  (1.753 m)  PainSc: 9    Body mass index is 34.26  kg/m.  Medications Outpatient Encounter Medications as of 10/10/2022  Medication Sig   albuterol (VENTOLIN HFA) 108 (90 Base) MCG/ACT inhaler Inhale 2 puffs into the lungs every 6 (six) hours as needed for wheezing or shortness of breath.   azithromycin (ZITHROMAX) 250 MG tablet Take 2 tablets (500 mg total) by mouth daily for 1 day, THEN 1 tablet (250 mg total) daily for 4 days.   Budeson-Glycopyrrol-Formoterol (BREZTRI AEROSPHERE) 160-9-4.8 MCG/ACT AERO Inhale 2 puffs into the lungs 2 (two) times daily.   ipratropium-albuterol (DUONEB) 0.5-2.5 (3) MG/3ML SOLN Take 3 mLs by nebulization every 6 (six) hours as needed (severe wheezing).   Misc. Devices MISC Nebulizer device.  Diagnosis COPD   predniSONE (DELTASONE) 10 MG tablet Take 4 tablets (40 mg total) by mouth daily with breakfast for 3 days, THEN 3 tablets (30 mg total) daily with breakfast for 3 days, THEN 2 tablets (20 mg total) daily with breakfast for 3 days, THEN 1 tablet (10 mg total) daily with breakfast for 3 days.   atorvastatin (LIPITOR) 20 MG tablet Take 1 tablet (20 mg total) by mouth daily. (Patient not taking: Reported on 10/10/2022)   furosemide (LASIX) 40 MG tablet Take 1 tablet (40 mg total) by mouth daily. (Patient not taking: Reported on 10/08/2022)   Misc. Devices MISC Blood pressure monitor. Diagnosis - Hypertension (Patient not taking: Reported on 10/10/2022)   No facility-administered encounter medications on file as  of 10/10/2022.     History: Past Medical History:  Diagnosis Date   Asthma    COPD (chronic obstructive pulmonary disease) (HCC)    Hx of adenomatous polyp of colon 01/13/2020   Hypertension    Past Surgical History:  Procedure Laterality Date   HAND SURGERY Bilateral left 1610,9604 Rt    Family History  Problem Relation Age of Onset   Hypertension Paternal Uncle    Cancer Maternal Grandmother    Hypertension Maternal Grandfather    Colon cancer Cousin    Colon polyps Neg Hx    Esophageal  cancer Neg Hx    Stomach cancer Neg Hx    Rectal cancer Neg Hx    Social History   Occupational History   Not on file  Tobacco Use   Smoking status: Every Day    Current packs/day: 1.00    Average packs/day: 1 pack/day for 45.0 years (45.0 ttl pk-yrs)    Types: Cigarettes   Smokeless tobacco: Never  Vaping Use   Vaping status: Never Used  Substance and Sexual Activity   Alcohol use: Yes    Alcohol/week: 1.0 standard drink of alcohol    Types: 1 Standard drinks or equivalent per week    Comment: "every once in awhile"-3 times a month per pt   Drug use: Yes    Frequency: 1.0 times per week    Types: Marijuana    Comment: occasionally marijuana-3 times a month per pt   Sexual activity: Not on file    Tobacco Counseling Ready to quit: Not Answered Counseling given: Not Answered   Immunizations and Health Maintenance Immunization History  Administered Date(s) Administered   Pneumococcal Polysaccharide-23 03/12/2016   Tdap 03/12/2016   There are no preventive care reminders to display for this patient.   Activities of Daily Living    10/10/2022   10:01 AM 05/22/2022    3:00 PM  In your present state of health, do you have any difficulty performing the following activities:  Hearing? 0 0  Vision? 1 1  Difficulty concentrating or making decisions? 0 0  Walking or climbing stairs? 0 0  Dressing or bathing? 0 0  Doing errands, shopping? 0 0  Preparing Food and eating ? N   Using the Toilet? N   In the past six months, have you accidently leaked urine? Y   Do you have problems with loss of bowel control? N   Managing your Medications? N   Managing your Finances? N   Housekeeping or managing your Housekeeping? N     Physical Exam   Physical Exam Constitutional:      Appearance: He is well-developed.  Cardiovascular:     Rate and Rhythm: Normal rate.     Heart sounds: Normal heart sounds. No murmur heard. Pulmonary:     Effort: Pulmonary effort is normal.      Breath sounds: Normal breath sounds. No wheezing or rales.  Chest:     Chest wall: No tenderness.  Abdominal:     General: Bowel sounds are normal. There is no distension.     Palpations: Abdomen is soft. There is no mass.     Tenderness: There is no abdominal tenderness.  Musculoskeletal:     Right lower leg: No edema.     Left lower leg: No edema.     Comments: + straight leg raise on the right  Left is normal  Neurological:     Mental Status: He is alert and oriented to  person, place, and time.  Psychiatric:        Mood and Affect: Mood normal.      Advanced Directives: Does Patient Have a Medical Advance Directive?: No Would patient like information on creating a medical advance directive?: No - Patient declined   EKG:  unchanged from previous tracings     Assessment:    This is a routine wellness  examination for this patient .   Vision/Hearing screen Vision Screening   Right eye Left eye Both eyes  Without correction 20/20 0/0   With correction        Goals      To follow up with General Surgery for evaluation of neck     Care Coordination Interventions: Evaluation of current treatment plan related to lipoma and patient's adherence to plan as established by provider Reviewed and discussed recent PCP referral for general surgery to evaluate potential lipoma located on patient's posterior neck Determined patient received some paperwork from the surgeons office for which he needs to complete, he has not received an appointment for initial consultation as of yet that he aware of         To follow up with Pulmonology for evaluation of COPD     Care Coordination Interventions: Evaluation of current treatment plan related to COPD  and patient's adherence to plan as established by provider Determined patient completed his follow up with Pulmonologist, Dr. Francine Graven for evaluation of COPD  Review of patient status, including review of consultant's reports, relevant  laboratory and other test results, and medications completed Provided education about and advised patient to utilize infection prevention strategies to reduce risk of respiratory infection Educated patient regarding options for smoking cessation, he will try the nicotine throat lozenges  Instructed patient to notify his doctor promptly of new symptoms or concerns               Depression Screen    10/10/2022   10:13 AM 06/04/2022    2:48 PM 05/06/2022    1:17 PM 01/30/2022    2:15 PM  PHQ 2/9 Scores  PHQ - 2 Score 0  0 0  PHQ- 9 Score    0  Exception Documentation  Patient refusal       Fall Risk    10/10/2022   10:11 AM  Fall Risk   Falls in the past year? 1  Number falls in past yr: 0  Injury with Fall? 0  Risk for fall due to : History of fall(s)  Follow up Falls evaluation completed    Cognitive Function        10/10/2022   10:14 AM  6CIT Screen  What Year? 0 points  What month? 0 points  What time? 0 points  Count back from 20 2 points  Months in reverse 4 points  Repeat phrase 2 points  Total Score 8 points    Patient Care Team: Hoy Register, MD as PCP - General (Family Medicine) Parke Poisson, MD as PCP - Cardiology (Cardiology)     Plan:  Annual Wellness Exam Counseled on 150 minutes of exercise per week, healthy eating (including decreased daily intake of saturated fats, cholesterol, added sugars, sodium), routine healthcare maintenance.     Hypertension Elevated blood pressure readings. Patient is non-compliant with the recommendation to start antihypertensive medication due to personal beliefs and habits. -Continue to educate on the importance of blood pressure control and the risks of untreated hypertension.  COPD Patient is compliant with COPD  medications. -Continue current COPD medications.  Tobacco Use Patient is currently smoking 10 cigarettes per day, down from a pack per day. Using gum to aid in smoking  cessation. -Encourage continued smoking cessation efforts.  Sciatica Classic symptoms of sciatica with pain radiating from the calf to the upper bone. No back pain reported. -Refer to physical therapy for exercises and heat therapy. -Prescribe muscle relaxer for pain relief.  General Health Maintenance Patient declined pneumonia vaccine due to personal beliefs. -Continue to educate on the importance of vaccinations for overall health maintenance.        I have personally reviewed and noted the following in the patient's chart:   Medical and social history Use of alcohol, tobacco or illicit drugs  Current medications and supplements Functional ability and status Nutritional status Physical activity Advanced directives List of other physicians Hospitalizations, surgeries, and ER visits in previous 12 months Vitals Screenings to include cognitive, depression, and falls Referrals and appointments  In addition, I have reviewed and discussed with patient certain preventive protocols, quality metrics, and best practice recommendations. A written personalized care plan for preventive services as well as general preventive health recommendations were provided to patient.     Hoy Register, MD 10/10/2022

## 2022-10-10 NOTE — Patient Outreach (Signed)
Care Coordination   Follow Up Visit Note   10/10/2022 Name: Kamran Robson MRN: [id number] DOB: 1956-01-04  Cathie Olden Caraveo is a 67 y.o. year old male who sees Hoy Register, MD for primary care. I spoke with  Vilinda Blanks by phone today.  What matters to the patients health and wellness today?  Patient will start using nicotine throat lozenges to help him quit smoking.     Goals Addressed             This Visit's Progress    To follow up with Pulmonology for evaluation of COPD   On track    Care Coordination Interventions: Evaluation of current treatment plan related to COPD  and patient's adherence to plan as established by provider Determined patient completed his follow up with Pulmonologist, Dr. Francine Graven for evaluation of COPD  Review of patient status, including review of consultant's reports, relevant laboratory and other test results, and medications completed Provided education about and advised patient to utilize infection prevention strategies to reduce risk of respiratory infection Educated patient regarding options for smoking cessation, he will try the nicotine throat lozenges  Instructed patient to notify his doctor promptly of new symptoms or concerns     Interventions Today    Flowsheet Row Most Recent Value  Chronic Disease   Chronic disease during today's visit Chronic Obstructive Pulmonary Disease (COPD)  General Interventions   General Interventions Discussed/Reviewed General Interventions Discussed, General Interventions Reviewed, Doctor Visits  Doctor Visits Discussed/Reviewed Doctor Visits Discussed, Doctor Visits Reviewed, Specialist, PCP  Education Interventions   Education Provided Provided Education  Provided Verbal Education On When to see the doctor, Medication  Pharmacy Interventions   Pharmacy Dicussed/Reviewed Pharmacy Topics Discussed, Pharmacy Topics Reviewed, Medications and their functions          SDOH assessments  and interventions completed:  No     Care Coordination Interventions:  Yes, provided   Follow up plan: Follow up call scheduled for 12/10/22 @10 :30 AM    Encounter Outcome:  Patient Visit Completed

## 2022-10-14 ENCOUNTER — Telehealth: Payer: Self-pay | Admitting: Physical Medicine and Rehabilitation

## 2022-10-14 NOTE — Telephone Encounter (Signed)
Patient called and wanting to schedule an appointment for sciatica. CB#[phone]

## 2022-10-15 NOTE — Telephone Encounter (Signed)
Spoke with patient and he would like to be evaluated. New patient appointment scheduled 10/21/22

## 2022-10-16 ENCOUNTER — Encounter: Payer: Self-pay | Admitting: Pulmonary Disease

## 2022-10-21 ENCOUNTER — Ambulatory Visit (INDEPENDENT_AMBULATORY_CARE_PROVIDER_SITE_OTHER): Payer: Medicare HMO | Admitting: Physical Medicine and Rehabilitation

## 2022-10-21 ENCOUNTER — Other Ambulatory Visit (INDEPENDENT_AMBULATORY_CARE_PROVIDER_SITE_OTHER): Payer: Self-pay

## 2022-10-21 ENCOUNTER — Encounter: Payer: Self-pay | Admitting: Physical Medicine and Rehabilitation

## 2022-10-21 DIAGNOSIS — G8929 Other chronic pain: Secondary | ICD-10-CM

## 2022-10-21 DIAGNOSIS — R269 Unspecified abnormalities of gait and mobility: Secondary | ICD-10-CM | POA: Diagnosis not present

## 2022-10-21 DIAGNOSIS — M5416 Radiculopathy, lumbar region: Secondary | ICD-10-CM

## 2022-10-21 DIAGNOSIS — M5441 Lumbago with sciatica, right side: Secondary | ICD-10-CM
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
## 2022-10-21 NOTE — Progress Notes (Unsigned)
Functional Pain Scale - descriptive words and definitions  Unmanageable (7)  Pain interferes with normal ADL's/nothing seems to help/sleep is very difficult/active distractions are very difficult to concentrate on. Severe range order  Average Pain  varies  Lower back pain on right side that started 3 weeks ago. Pain in right calf that radiates to the back of the knee. Right lower back pain

## 2022-10-29 NOTE — Therapy (Signed)
OUTPATIENT PHYSICAL THERAPY THORACOLUMBAR EVALUATION   Patient Name: [name] MRN: [id number] DOB:1955/08/08, 67 y.o., male Today's Date: 10/30/2022  END OF SESSION:  PT End of Session - 10/30/22 0848     Visit Number 1    Number of Visits 7    Date for PT Re-Evaluation 12/11/22    Authorization Type [employer] medicare/medicaid    Authorization Time Period auth tbd    Progress Note Due on Visit 10    PT Start Time 0850   late check in   PT Stop Time 0932    PT Time Calculation (min) 42 min    Activity Tolerance Patient tolerated treatment well;No increased pain    Behavior During Therapy [facility] for tasks assessed/performed             Past Medical History:  Diagnosis Date   Asthma    COPD (chronic obstructive pulmonary disease) (HCC)    Hx of adenomatous polyp of colon 01/13/2020   Hypertension    Past Surgical History:  Procedure Laterality Date   HAND SURGERY Bilateral left 5621,3086 Rt   Patient Active Problem List   Diagnosis Date Noted   Medication therapy management recommendation declined by patient 10/10/2022   Hyperlipidemia 05/22/2022   HCAP (healthcare-associated pneumonia) 05/21/2022   Acute respiratory distress 05/11/2022   CAP (community acquired pneumonia) 05/10/2022   Hx of adenomatous polyp of colon 01/13/2020   COPD with acute exacerbation (HCC) 11/23/2016   Diabetes mellitus screening 03/12/2016   Essential hypertension 10/22/2013   Elevated BP 07/19/2013   Obesity 02/04/2012   Acute respiratory failure with hypoxia (HCC) 02/04/2012   Tobacco abuse 02/03/2012    PCP: Hoy Register, MD  REFERRING PROVIDER: Hoy Register, MD  REFERRING DIAG: M54.31 (ICD-10-CM) - Sciatica of right side  Rationale for Evaluation and Treatment: Rehabilitation  THERAPY DIAG:  Other low back pain  Other abnormalities of gait and mobility  Abnormal posture  ONSET DATE: a bit over a month  SUBJECTIVE:                                                                                                                                                                                            SUBJECTIVE STATEMENT: Pt states he feels that his symptoms were preceded by overstretching in his sleep, felt pain the next morning. Pt states he was told he has sciatic nerve issues, feels like he is walking bent over. Feels symptoms are improving over the past couple days, has been working on some self stretching. Pt describes himself as sedentary at baseline. He does endorse some BIL LE swelling over last few weeks  that has improved some, encouraged him to continue discussing with providers and monitor for red flags. Has begun using cane due to difficulty walking but notes this is improving. Waking 2-3x/night on average due to pain.   PERTINENT HISTORY:  HTN, COPD  PAIN:  Are you having pain: none at present Location/description: RLE - began in calf, spread proximally to low back. Mild tingling Best-worst over past week: 0-10/10 - aggravating factors: sitting straight, also varies by positioning. Standing/walking straight - Easing factors: medication, stretching, leaning   PRECAUTIONS: None  WEIGHT BEARING RESTRICTIONS: No  FALLS:  Has patient fallen in last 6 months? Yes. Number of falls 1 fall - about 3-4 months ago, fell off ladder, pt denies any issues /injuries afterwards  LIVING ENVIRONMENT: Lives alone, 1 story home, no STE Has fiancee who sometimes helps with house work as needed but pt states he typically performs himself  OCCUPATION: not currently  PLOF: Independent  PATIENT GOALS: back to normal   NEXT MD VISIT: 6 months per pt   OBJECTIVE:  Note: Objective measures were completed at Evaluation unless otherwise noted.  DIAGNOSTIC FINDINGS:  10/21/22 lumbar XR in epic: "4 view radiographs of lumbar spine exhibit straightening of normal  lordosis, no spondyloloisthesis, lower lumbar facet arthropathy, foraminal  stenosis  at L4-L5 and L5-S1. No fractures or disclocations. "  PATIENT SURVEYS:  FOTO 61 current, 72 predicted  SCREENING FOR RED FLAGS: Red flag screening reassuring overall - bowel/bladder changes: denies lack of control, endorses increased frequency of urination that does not seem to be worsening per pt description - bucking/overt weakness: none recently - endorses some chronic difficulty with bringing R foot up but no episodes of leg giving out or overt buckling - fevers/night sweats: no new changes - endorses "sweating in my sleep as long as I can remember" - bilateral LE numbness or saddle anesthesia: denies  COGNITION: Overall cognitive status: Within functional limits for tasks assessed    SENSATION/NEURO: Light touch intact all extremities   No clonus either LE Negative hoffmann and tromner sign BIL No ataxia with gait   POSTURE: forward flexed trunk in sitting and standing, in sitting pt demos lateral shift away from R and pain when he corrects  PALPATION/OBSERVATION: Palpation deferred given time constraints BIL foot swelling L>R, nontender throughout feet and calves, no erythema and skin appears normal.  LUMBAR ROM:   AROM eval  Flexion Mid shin painless  Extension Pain with neutral  Right lateral flexion 25% *  Left lateral flexion To knee joint  Right rotation   Left rotation    (Blank rows = not tested) (Key: WFL = within functional limits not formally assessed, * = concordant pain, s = stiffness/stretching sensation, NT = not tested)  Comments: R lateral flexion and extension both provoke back pain and RLE symptoms  LOWER EXTREMITY ROM:     Active  Right eval Left eval  Hip flexion    Hip extension    Hip abduction    Hip adduction    Hip internal rotation    Hip external rotation    Knee flexion    Knee extension    Ankle dorsiflexion    Ankle plantarflexion    Ankle inversion    Ankle eversion     (Blank rows = not tested)  LOWER EXTREMITY MMT:     MMT Right eval Left eval  Hip flexion 5 5  Hip extension    Hip abduction (seated) 5 5  Hip adduction  Hip internal rotation    Hip external rotation    Knee flexion 5 5  Knee extension 5 5  Ankle dorsiflexion 5 5  Ankle plantarflexion    Ankle inversion    Ankle eversion     (Blank rows = not tested) (Key: WFL = within functional limits not formally assessed, * = concordant pain, s = stiffness/stretching sensation, NT = not tested)  Comments: all MMT painless  LUMBAR SPECIAL TESTS:  deferred  FUNCTIONAL TESTS:  5xSTS 28sec with UE support (pt does remain standing for a few seconds prior to descent on last rep due to reported relief, returns to baseline with sitting)  GAIT: Distance walked: within clinic Assistive device utilized: Single point cane Level of assistance: Modified independence Comments: antalgic gait RLE, fwd flexed posture, minimal trunk rotation and reduced hip extension  TODAY'S TREATMENT:                                                                                                                              [facility] Adult PT Treatment:                                                DATE: 10/30/22 Deferred given time constraints w/ late check in   PATIENT EDUCATION:  Education details: Pt education on PT impairments, prognosis, and POC. Informed consent. Rationale for interventions, monitoring symptoms and appropriate action Person educated: Patient Education method: Explanation, Demonstration, Tactile cues, Verbal cues Education comprehension: verbalized understanding, returned demonstration, verbal cues required, tactile cues required, and needs further education    HOME EXERCISE PROGRAM: Deferred given time constraints w/ late check in  ASSESSMENT:  CLINICAL IMPRESSION: Patient is a 68 y.o. gentleman who was seen today for physical therapy evaluation and treatment for low back pain with RLE referral ongoing a bit over a month. Red flag  screening reassuring overall, did educate pt on monitoring for red flags and continue discussing LE edema with providers. On exam, pt demonstrates notably flexed posture in sitting and standing, has difficulty/pain with achieving neutral extension and same difficulty with sidebending towards R. Excellent LE strength bilaterally and neuro screening reassuring.  Increased time is spent w/ pt subjective/exam and education, he verbalizes agreement/understanding with plan. No adverse events, pt denies any change in pain on departure. Recommend trial of skilled PT to address aforementioned deficits with aim of improving functional tolerance and reducing pain with typical activities. Pt departs today's session in no acute distress, all voiced concerns/questions addressed appropriately from PT perspective.    OBJECTIVE IMPAIRMENTS: Abnormal gait, decreased activity tolerance, decreased balance, decreased endurance, decreased mobility, difficulty walking, decreased ROM, improper body mechanics, postural dysfunction, and pain.   ACTIVITY LIMITATIONS: carrying, lifting, sitting, standing, squatting, stairs, transfers, bed mobility, and locomotion level  PARTICIPATION LIMITATIONS: meal prep, cleaning, laundry, driving, shopping,  and community activity  PERSONAL FACTORS: Age, Time since onset of injury/illness/exacerbation, and 1-2 comorbidities: HTN, COPD  are also affecting patient's functional outcome.   REHAB POTENTIAL: Good  CLINICAL DECISION MAKING: Evolving/moderate complexity  EVALUATION COMPLEXITY: Moderate   GOALS: Goals reviewed with patient? Yes  SHORT TERM GOALS: Target date: 11/20/2022 Pt will demonstrate appropriate understanding and performance of initially prescribed HEP in order to facilitate improved independence with management of symptoms.  Baseline: HEP TBD Goal status: INITIAL   2. Pt will score greater than or equal to 66 on FOTO in order to demonstrate improved perception of  function due to symptoms.  Baseline: 61  Goal status: INITIAL    LONG TERM GOALS: Target date: 12/11/2022 Pt will score 72 on FOTO in order to demonstrate improved perception of functional status due to symptoms.  Baseline: 61 Goal status: INITIAL  2.  Pt will demonstrate at least 50% normal lumbar extension AROM in order to demonstrate improved tolerance to functional movement patterns.  Baseline: unable to tolerate past neutral due to pain Goal status: INITIAL  3.  Pt will report ability to sleep through the night without waking due to pain in order to return to PLOF and maximize overall health/QOL.  Baseline: waking 2-3x/night on average Goal status: INITIAL  4. Pt will perform 5xSTS in <16 sec in order to demonstrate reduced fall risk and improved functional independence. (MCID of 2.3sec)  Baseline: 28sec with UE support  Goal status: INITIAL   5. Pt will report at least 50% decrease in overall pain levels in past week in order to facilitate improved tolerance to basic ADLs/mobility.   Baseline: 0-10/10  Goal status: INITIAL    PLAN:  PT FREQUENCY: 1x/week  PT DURATION: 6 weeks  PLANNED INTERVENTIONS: 97164- PT Re-evaluation, 97110-Therapeutic exercises, 97530- Therapeutic activity, 97112- Neuromuscular re-education, 97535- Self Care, 16109- Manual therapy, [phone]- Gait training, [phone]- Aquatic Therapy, Patient/Family education, Balance training, Stair training, Taping, Dry Needling, Joint mobilization, Cryotherapy, and Moist heat.  PLAN FOR NEXT SESSION: Review/update HEP PRN. Work on [employer] exercises as appropriate with emphasis on gradually restoring extension tolerance and reducing lateral shift. Symptom modification strategies as indicated/appropriate.    Ashley Murrain PT, DPT 10/30/2022 10:50 AM     Referring diagnosis? M54.31 (ICD-10-CM) - Sciatica of right side Treatment diagnosis? (if different than referring diagnosis) Other low back pain   Other  abnormalities of gait and mobility   Abnormal posture  What was this (referring dx) caused by? []  Surgery []  Fall []  Ongoing issue []  Arthritis [x]  Other: ______acute issue, no recalled injury______  Laterality: [x]  Rt []  Lt []  Both  Check all possible CPT codes:  *CHOOSE 10 OR LESS*    []  97110 (Therapeutic Exercise)  []  92507 (SLP Treatment)  []  97112 (Neuro Re-ed)   []  92526 (Swallowing Treatment)   []  97116 (Gait Training)   []  [id number] (Cognitive Training, 1st 15 minutes) []  97140 (Manual Therapy)   []  97130 (Cognitive Training, each add'l 15 minutes)  []  97164 (Re-evaluation)                              []  Other, List CPT Code ____________  []  97530 (Therapeutic Activities)     []  97535 (Self Care)   [x]  All codes above (97110 - 97535)  []  97012 (Mechanical Traction)  []  97014 (E-stim Unattended)  []  97032 (E-stim manual)  []  97033 (Ionto)  []  97035 (Ultrasound) []  97750 (  Physical Performance Training) [x]  [id number] (Aquatic Therapy) []  97016 (Vasopneumatic Device) []  [id number] (Paraffin) []  97034 (Contrast Bath) []  16109 (Wound Care 1st 20 sq cm) []  97598 (Wound Care each add'l 20 sq cm) []  97760 (Orthotic Fabrication, Fitting, Training Initial) []  [id number] (Prosthetic Management and Training Initial) []  [id number] (Orthotic or Prosthetic Training/ Modification Subsequent)

## 2022-10-30 ENCOUNTER — Ambulatory Visit: Payer: Medicare HMO | Attending: Family Medicine | Admitting: Physical Therapy

## 2022-10-30 ENCOUNTER — Encounter: Payer: Self-pay | Admitting: Physical Therapy

## 2022-10-30 ENCOUNTER — Other Ambulatory Visit: Payer: Self-pay

## 2022-10-30 DIAGNOSIS — R2689 Other abnormalities of gait and mobility: Secondary | ICD-10-CM | POA: Insufficient documentation

## 2022-10-30 DIAGNOSIS — R293 Abnormal posture: Secondary | ICD-10-CM | POA: Diagnosis not present

## 2022-10-30 DIAGNOSIS — M5431 Sciatica, right side: Secondary | ICD-10-CM | POA: Insufficient documentation

## 2022-10-30 DIAGNOSIS — M5459 Other low back pain: Secondary | ICD-10-CM | POA: Insufficient documentation

## 2022-11-04 ENCOUNTER — Other Ambulatory Visit: Payer: Self-pay

## 2022-11-07 ENCOUNTER — Encounter: Payer: Self-pay | Admitting: Physical Therapy

## 2022-11-07 ENCOUNTER — Ambulatory Visit: Payer: Medicare HMO | Admitting: Physical Therapy

## 2022-11-07 DIAGNOSIS — R293 Abnormal posture: Secondary | ICD-10-CM | POA: Diagnosis not present

## 2022-11-07 DIAGNOSIS — M5459 Other low back pain: Secondary | ICD-10-CM

## 2022-11-07 DIAGNOSIS — R2689 Other abnormalities of gait and mobility: Secondary | ICD-10-CM

## 2022-11-07 DIAGNOSIS — M5431 Sciatica, right side: Secondary | ICD-10-CM | POA: Diagnosis not present

## 2022-11-07 NOTE — Therapy (Signed)
OUTPATIENT PHYSICAL THERAPY TREATMENT   Patient Name: [name] MRN: [id number] DOB:1955/08/16, 67 y.o., male Today's Date: 11/07/2022  END OF SESSION:  PT End of Session - 11/07/22 1225     Visit Number 2    Number of Visits 7    Date for PT Re-Evaluation 12/11/22    Authorization Type [employer] medicare/medicaid    Authorization Time Period 10/21 - 11/21/22    Authorization - Visit Number 1    Authorization - Number of Visits 6    Progress Note Due on Visit 10    PT Start Time 1105    PT Stop Time 1149    PT Time Calculation (min) 44 min    Activity Tolerance Patient tolerated treatment well;No increased pain    Behavior During Therapy [facility] for tasks assessed/performed              Past Medical History:  Diagnosis Date   Asthma    COPD (chronic obstructive pulmonary disease) (HCC)    Hx of adenomatous polyp of colon 01/13/2020   Hypertension    Past Surgical History:  Procedure Laterality Date   HAND SURGERY Bilateral left [id number] Rt   Patient Active Problem List   Diagnosis Date Noted   Medication therapy management recommendation declined by patient 10/10/2022   Hyperlipidemia 05/22/2022   HCAP (healthcare-associated pneumonia) 05/21/2022   Acute respiratory distress 05/11/2022   CAP (community acquired pneumonia) 05/10/2022   Hx of adenomatous polyp of colon 01/13/2020   COPD with acute exacerbation (HCC) 11/23/2016   Diabetes mellitus screening 03/12/2016   Essential hypertension 10/22/2013   Elevated BP 07/19/2013   Obesity 02/04/2012   Acute respiratory failure with hypoxia (HCC) 02/04/2012   Tobacco abuse 02/03/2012    PCP: Hoy Register, MD  REFERRING PROVIDER: Hoy Register, MD  REFERRING DIAG: M54.31 (ICD-10-CM) - Sciatica of right side  Rationale for Evaluation and Treatment: Rehabilitation  THERAPY DIAG:  Other low back pain  Other abnormalities of gait and mobility  Abnormal posture  ONSET DATE: a bit over a  month  SUBJECTIVE:                                                                                                                                                                                           SUBJECTIVE STATEMENT: "I am walking a better, I've been doing stretching. I didn't need to come in with the cane."  PERTINENT HISTORY:  HTN, COPD  PAIN:  Are you having pain: 8/10 (back and R calf) Location/description: RLE - began in calf, spread proximally to low back. Mild tingling Best-worst over past week: 0-10/10 - aggravating  factors: sitting straight, also varies by positioning. Standing/walking straight - Easing factors: medication, stretching, leaning   PRECAUTIONS: None  WEIGHT BEARING RESTRICTIONS: No  FALLS:  Has patient fallen in last 6 months? Yes. Number of falls 1 fall - about 3-4 months ago, fell off ladder, pt denies any issues /injuries afterwards  LIVING ENVIRONMENT: Lives alone, 1 story home, no STE Has fiancee who sometimes helps with house work as needed but pt states he typically performs himself  OCCUPATION: not currently  PLOF: Independent  PATIENT GOALS: back to normal   NEXT MD VISIT: 6 months per pt   OBJECTIVE:  Note: Objective measures were completed at Evaluation unless otherwise noted.  DIAGNOSTIC FINDINGS:  10/21/22 lumbar XR in epic: "4 view radiographs of lumbar spine exhibit straightening of normal  lordosis, no spondyloloisthesis, lower lumbar facet arthropathy, foraminal  stenosis at L4-L5 and L5-S1. No fractures or disclocations. "  PATIENT SURVEYS:  FOTO 61 current, 72 predicted  SCREENING FOR RED FLAGS: Red flag screening reassuring overall - bowel/bladder changes: denies lack of control, endorses increased frequency of urination that does not seem to be worsening per pt description - bucking/overt weakness: none recently - endorses some chronic difficulty with bringing R foot up but no episodes of leg giving out or overt  buckling - fevers/night sweats: no new changes - endorses "sweating in my sleep as long as I can remember" - bilateral LE numbness or saddle anesthesia: denies  COGNITION: Overall cognitive status: Within functional limits for tasks assessed    SENSATION/NEURO: Light touch intact all extremities   No clonus either LE Negative hoffmann and tromner sign BIL No ataxia with gait   POSTURE: forward flexed trunk in sitting and standing, in sitting pt demos lateral shift away from R and pain when he corrects  PALPATION/OBSERVATION: Palpation deferred given time constraints BIL foot swelling L>R, nontender throughout feet and calves, no erythema and skin appears normal.  LUMBAR ROM:   AROM eval  Flexion Mid shin painless  Extension Pain with neutral  Right lateral flexion 25% *  Left lateral flexion To knee joint  Right rotation   Left rotation    (Blank rows = not tested) (Key: WFL = within functional limits not formally assessed, * = concordant pain, s = stiffness/stretching sensation, NT = not tested)  Comments: R lateral flexion and extension both provoke back pain and RLE symptoms  LOWER EXTREMITY ROM:     Active  Right eval Left eval  Hip flexion    Hip extension    Hip abduction    Hip adduction    Hip internal rotation    Hip external rotation    Knee flexion    Knee extension    Ankle dorsiflexion    Ankle plantarflexion    Ankle inversion    Ankle eversion     (Blank rows = not tested)  LOWER EXTREMITY MMT:    MMT Right eval Left eval  Hip flexion 5 5  Hip extension    Hip abduction (seated) 5 5  Hip adduction    Hip internal rotation    Hip external rotation    Knee flexion 5 5  Knee extension 5 5  Ankle dorsiflexion 5 5  Ankle plantarflexion    Ankle inversion    Ankle eversion     (Blank rows = not tested) (Key: WFL = within functional limits not formally assessed, * = concordant pain, s = stiffness/stretching sensation, NT = not tested)   Comments:  all MMT painless  LUMBAR SPECIAL TESTS:  deferred  FUNCTIONAL TESTS:  5xSTS 28sec with UE support (pt does remain standing for a few seconds prior to descent on last rep due to reported relief, returns to baseline with sitting)  GAIT: Distance walked: within clinic Assistive device utilized: Single point cane Level of assistance: Modified independence Comments: antalgic gait RLE, fwd flexed posture, minimal trunk rotation and reduced hip extension  TODAY'S TREATMENT:                                                                                                                              [facility] Adult PT Treatment:                                                DATE: 11/07/2022 Therapeutic Exercise: Prone over 3 pillows under hips - trialed prone press up unable to tolerate so focused on just laying prone with MHP on back. No changes in RLE referred symptoms Modified to 2 pillows with combined with manual PA lumbar mobs pt noted centralization to the R glute med Progressed to 1 pillow conitnued with manual PA lumbar mobs Progressed to 1 pillow with prone on elbows and pt noted continued centralization  Provided initial HEP  Manual Therapy: L4-L5 PA grade III performed concordant to prone posiition/ extension treatment Therapeutic Activity: Reviewed anatomy of the lumbar spine and effect of his current lumbar positioning and lifting mechanics have in relation to the LE referred symptoms.  Modalities: MHP with pt in prone concordant to extension activities to ease low back pain x 15 min Self Care: Reviewed posture and lifting mechanics and importance of lifting with hips and knees to reduce issues on the back and disc.    [facility] Adult PT Treatment:                                                DATE: 10/30/22 Deferred given time constraints w/ late check in   PATIENT EDUCATION:  Education details: Pt education on PT impairments, prognosis, and POC. Informed consent.  Rationale for interventions, monitoring symptoms and appropriate action Person educated: Patient Education method: Explanation, Demonstration, Tactile cues, Verbal cues Education comprehension: verbalized understanding, returned demonstration, verbal cues required, tactile cues required, and needs further education    HOME EXERCISE PROGRAM: Access Code: [email or web address] URL: [email or web address] Date: 11/07/2022 Prepared by: Lulu Riding  Exercises - Lying Prone  - 1 x daily - 7 x weekly - 5 sets - 5 reps - 5-10 minutes hold - Prone Press Up On Elbows  - 3-5 x daily - 7 x weekly - 5 sets - 10 reps - 5 seconds hold - Lower Trunk Rotation  -  1 x daily - 7 x weekly - 2 sets - 10 reps - Squat   ASSESSMENT:  CLINICAL IMPRESSION: 11/07/2022 Patient arrives to session noting increased pain in the back R calf and hip rated at 8/10 pain. Focused on potential referral of back with repeated extension especially with patient presentation of sustained trunk flexion with walking / standing. Starting with pt in prone over 3 pillows under waist and progressed to gradually to 1 pillow. Trialed prone on elbows which pt didn't tolerate well initially so modified to manual lumbar mobs at L3-L4 with pt noted significant relief of pain and centralization in the low back and after was able to tolerated prone on elbows with improvement of RLE pain and continued centralization. Provided initial HEP today for prone / prone on elbows which he noted pain dropped during session from 8 to a 1/10.   ASSESSMENT AT EVALUATION: Patient is a 67 y.o. gentleman who was seen today for physical therapy evaluation and treatment for low back pain with RLE referral ongoing a bit over a month. Red flag screening reassuring overall, did educate pt on monitoring for red flags and continue discussing LE edema with providers. On exam, pt demonstrates notably flexed posture in sitting and standing, has difficulty/pain with  achieving neutral extension and same difficulty with sidebending towards R. Excellent LE strength bilaterally and neuro screening reassuring.  Increased time is spent w/ pt subjective/exam and education, he verbalizes agreement/understanding with plan. No adverse events, pt denies any change in pain on departure. Recommend trial of skilled PT to address aforementioned deficits with aim of improving functional tolerance and reducing pain with typical activities. Pt departs today's session in no acute distress, all voiced concerns/questions addressed appropriately from PT perspective.    OBJECTIVE IMPAIRMENTS: Abnormal gait, decreased activity tolerance, decreased balance, decreased endurance, decreased mobility, difficulty walking, decreased ROM, improper body mechanics, postural dysfunction, and pain.   ACTIVITY LIMITATIONS: carrying, lifting, sitting, standing, squatting, stairs, transfers, bed mobility, and locomotion level  PARTICIPATION LIMITATIONS: meal prep, cleaning, laundry, driving, shopping, and community activity  PERSONAL FACTORS: Age, Time since onset of injury/illness/exacerbation, and 1-2 comorbidities: HTN, COPD  are also affecting patient's functional outcome.   REHAB POTENTIAL: Good  CLINICAL DECISION MAKING: Evolving/moderate complexity  EVALUATION COMPLEXITY: Moderate   GOALS: Goals reviewed with patient? Yes  SHORT TERM GOALS: Target date: 11/20/2022 Pt will demonstrate appropriate understanding and performance of initially prescribed HEP in order to facilitate improved independence with management of symptoms.  Baseline: HEP TBD Goal status: INITIAL   2. Pt will score greater than or equal to 66 on FOTO in order to demonstrate improved perception of function due to symptoms.  Baseline: 61  Goal status: INITIAL    LONG TERM GOALS: Target date: 12/11/2022 Pt will score 72 on FOTO in order to demonstrate improved perception of functional status due to symptoms.   Baseline: 61 Goal status: INITIAL  2.  Pt will demonstrate at least 50% normal lumbar extension AROM in order to demonstrate improved tolerance to functional movement patterns.  Baseline: unable to tolerate past neutral due to pain Goal status: INITIAL  3.  Pt will report ability to sleep through the night without waking due to pain in order to return to PLOF and maximize overall health/QOL.  Baseline: waking 2-3x/night on average Goal status: INITIAL  4. Pt will perform 5xSTS in <16 sec in order to demonstrate reduced fall risk and improved functional independence. (MCID of 2.3sec)  Baseline: 28sec with UE support  Goal status: INITIAL   5. Pt will report at least 50% decrease in overall pain levels in past week in order to facilitate improved tolerance to basic ADLs/mobility.   Baseline: 0-10/10  Goal status: INITIAL    PLAN:  PT FREQUENCY: 1x/week  PT DURATION: 6 weeks  PLANNED INTERVENTIONS: 97164- PT Re-evaluation, 97110-Therapeutic exercises, 97530- Therapeutic activity, 97112- Neuromuscular re-education, 97535- Self Care, 60454- Manual therapy, [phone]- Gait training, [phone]- Aquatic Therapy, Patient/Family education, Balance training, Stair training, Taping, Dry Needling, Joint mobilization, Cryotherapy, and Moist heat.  PLAN FOR NEXT SESSION: Review/update HEP PRN. Work on [employer] exercises as appropriate with emphasis on gradually restoring extension tolerance and reducing lateral shift. Repeated extension in prone prone progression from elbows if possible. MHP PRN for pain,   Jamill Wetmore PT, DPT, LAT, ATC  11/07/22  12:38 PM

## 2022-11-15 ENCOUNTER — Ambulatory Visit: Payer: Medicare HMO | Admitting: Physical Therapy

## 2022-11-19 NOTE — Therapy (Signed)
OUTPATIENT PHYSICAL THERAPY TREATMENT   Patient Name: [name] MRN: [id number] DOB:Jan 25, 1955, 67 y.o., male Today's Date: 11/20/2022  END OF SESSION:  PT End of Session - 11/20/22 0846     Visit Number 3    Number of Visits 7    Date for PT Re-Evaluation 12/11/22    Authorization Type [employer] medicare/medicaid    Authorization Time Period 10/21 - 11/21/22    Authorization - Visit Number 2    Authorization - Number of Visits 6    Progress Note Due on Visit 10    PT Start Time 0847    PT Stop Time 0926    PT Time Calculation (min) 39 min    Activity Tolerance Patient tolerated treatment well;No increased pain    Behavior During Therapy [facility] for tasks assessed/performed               Past Medical History:  Diagnosis Date   Asthma    COPD (chronic obstructive pulmonary disease) (HCC)    Hx of adenomatous polyp of colon 01/13/2020   Hypertension    Past Surgical History:  Procedure Laterality Date   HAND SURGERY Bilateral left 4098,1191 Rt   Patient Active Problem List   Diagnosis Date Noted   Medication therapy management recommendation declined by patient 10/10/2022   Hyperlipidemia 05/22/2022   HCAP (healthcare-associated pneumonia) 05/21/2022   Acute respiratory distress 05/11/2022   CAP (community acquired pneumonia) 05/10/2022   Hx of adenomatous polyp of colon 01/13/2020   COPD with acute exacerbation (HCC) 11/23/2016   Diabetes mellitus screening 03/12/2016   Essential hypertension 10/22/2013   Elevated BP 07/19/2013   Obesity 02/04/2012   Acute respiratory failure with hypoxia (HCC) 02/04/2012   Tobacco abuse 02/03/2012    PCP: Hoy Register, MD  REFERRING PROVIDER: Hoy Register, MD  REFERRING DIAG: M54.31 (ICD-10-CM) - Sciatica of right side  Rationale for Evaluation and Treatment: Rehabilitation  THERAPY DIAG:  Other low back pain  Other abnormalities of gait and mobility  Abnormal posture  ONSET DATE: a bit over a  month  SUBJECTIVE:                                                                                                                                                                                           SUBJECTIVE STATEMENT: Pt states he was pretty sore after last session, pain continues to fluctuate. Arrives w/o cane. Has been using a back brace some which has helped him stay more upright. Has been unable to do prone exercises due to pain per pt report. He also notes his prior LE swelling has fully resolved  PERTINENT HISTORY:  HTN, COPD  PAIN:  Are you having pain: no back pain, "not that much" in calf while sitting, more so while lying down and standing, unrated on NPS  Location/description: RLE - began in calf, spread proximally to low back. Mild tingling Best-worst over past week: 0-10/10 - aggravating factors: sitting straight, also varies by positioning. Standing/walking straight - Easing factors: medication, stretching, leaning   PRECAUTIONS: None  WEIGHT BEARING RESTRICTIONS: No  FALLS:  Has patient fallen in last 6 months? Yes. Number of falls 1 fall - about 3-4 months ago, fell off ladder, pt denies any issues /injuries afterwards  LIVING ENVIRONMENT: Lives alone, 1 story home, no STE Has fiancee who sometimes helps with house work as needed but pt states he typically performs himself  OCCUPATION: not currently  PLOF: Independent  PATIENT GOALS: back to normal   NEXT MD VISIT: 6 months per pt   OBJECTIVE:  Note: Objective measures were completed at Evaluation unless otherwise noted.  DIAGNOSTIC FINDINGS:  10/21/22 lumbar XR in epic: "4 view radiographs of lumbar spine exhibit straightening of normal  lordosis, no spondyloloisthesis, lower lumbar facet arthropathy, foraminal  stenosis at L4-L5 and L5-S1. No fractures or disclocations. "  PATIENT SURVEYS:  FOTO 61 current, 72 predicted  SCREENING FOR RED FLAGS: Red flag screening reassuring overall -  bowel/bladder changes: denies lack of control, endorses increased frequency of urination that does not seem to be worsening per pt description - bucking/overt weakness: none recently - endorses some chronic difficulty with bringing R foot up but no episodes of leg giving out or overt buckling - fevers/night sweats: no new changes - endorses "sweating in my sleep as long as I can remember" - bilateral LE numbness or saddle anesthesia: denies  COGNITION: Overall cognitive status: Within functional limits for tasks assessed    SENSATION/NEURO: Light touch intact all extremities   No clonus either LE Negative hoffmann and tromner sign BIL No ataxia with gait   POSTURE: forward flexed trunk in sitting and standing, in sitting pt demos lateral shift away from R and pain when he corrects  PALPATION/OBSERVATION: Palpation deferred given time constraints BIL foot swelling L>R, nontender throughout feet and calves, no erythema and skin appears normal.  LUMBAR ROM:   AROM eval  Flexion Mid shin painless  Extension Pain with neutral  Right lateral flexion 25% *  Left lateral flexion To knee joint  Right rotation   Left rotation    (Blank rows = not tested) (Key: WFL = within functional limits not formally assessed, * = concordant pain, s = stiffness/stretching sensation, NT = not tested)  Comments: R lateral flexion and extension both provoke back pain and RLE symptoms  LOWER EXTREMITY ROM:     Active  Right eval Left eval  Hip flexion    Hip extension    Hip abduction    Hip adduction    Hip internal rotation    Hip external rotation    Knee flexion    Knee extension    Ankle dorsiflexion    Ankle plantarflexion    Ankle inversion    Ankle eversion     (Blank rows = not tested)  LOWER EXTREMITY MMT:    MMT Right eval Left eval  Hip flexion 5 5  Hip extension    Hip abduction (seated) 5 5  Hip adduction    Hip internal rotation    Hip external rotation    Knee  flexion 5 5  Knee  extension 5 5  Ankle dorsiflexion 5 5  Ankle plantarflexion    Ankle inversion    Ankle eversion     (Blank rows = not tested) (Key: WFL = within functional limits not formally assessed, * = concordant pain, s = stiffness/stretching sensation, NT = not tested)  Comments: all MMT painless  LUMBAR SPECIAL TESTS:  deferred  FUNCTIONAL TESTS:  5xSTS 28sec with UE support (pt does remain standing for a few seconds prior to descent on last rep due to reported relief, returns to baseline with sitting)  GAIT: Distance walked: within clinic Assistive device utilized: Single point cane Level of assistance: Modified independence Comments: antalgic gait RLE, fwd flexed posture, minimal trunk rotation and reduced hip extension  TODAY'S TREATMENT:                                                                                                                              [facility] Adult PT Treatment:                                                DATE: 11/20/22 Therapeutic Exercise: Passive lumbar extension at wall 2x5 cues for comfortable ROM Gastroc stretch at wall 2x8 gentle rocking  LAQ + DF 2x8  RLE hip extension at counter 2x10 cues for trunk posture  Seated scapular retraction + CKC scapular depression 2x12 cues for posture and extension  Seated thoracolumbar extension 2x5 HEP handout + education    [facility] Adult PT Treatment:                                                DATE: 11/07/2022 Therapeutic Exercise: Prone over 3 pillows under hips - trialed prone press up unable to tolerate so focused on just laying prone with MHP on back. No changes in RLE referred symptoms Modified to 2 pillows with combined with manual PA lumbar mobs pt noted centralization to the R glute med Progressed to 1 pillow conitnued with manual PA lumbar mobs Progressed to 1 pillow with prone on elbows and pt noted continued centralization  Provided initial HEP  Manual Therapy: L4-L5 PA grade III  performed concordant to prone posiition/ extension treatment Therapeutic Activity: Reviewed anatomy of the lumbar spine and effect of his current lumbar positioning and lifting mechanics have in relation to the LE referred symptoms.  Modalities: MHP with pt in prone concordant to extension activities to ease low back pain x 15 min Self Care: Reviewed posture and lifting mechanics and importance of lifting with hips and knees to reduce issues on the back and disc.     PATIENT EDUCATION:  Education details: rationale for interventions, HEP  Person educated: Patient Education method:  Explanation, Demonstration, Tactile cues, Verbal cues Education comprehension: verbalized understanding, returned demonstration, verbal cues required, tactile cues required, and needs further education    HOME EXERCISE PROGRAM: Access Code: [email or web address] URL: [email or web address] Date: 11/20/2022 Prepared by: Fransisco Hertz  Exercises - Standing Lumbar Extension at Wall - Forearms  - 1 x daily - 7 x weekly - 3 sets - 5 reps - Sit to Stand with Armchair  - 1 x daily - 7 x weekly - 3 sets - 5 reps - Standing Hip Extension with Counter Support  - 1 x daily - 7 x weekly - 2 sets - 10 reps  ASSESSMENT:  CLINICAL IMPRESSION: 11/20/2022 Pt arrives w/ reports of continued pain, about the same overall. He does mention that he was recollecting his fall over the summer and wonders if he may have injured his lower leg at that time and is curious about getting imaging - encouraged him to reach out to his provider to discuss. Continuing to work into more extension as pt demonstrates improved posture compared to initial eval, reports gradually improving low back pain/discomfort. He does endorse increased symptom irritability with prone positioning and reports improved tolerance to today's modifications. No adverse events, pt reports some improvement in RLE pain throughout session but on departure notes feeling about  the same as on arrival.   ASSESSMENT AT EVALUATION: Patient is a 67 y.o. gentleman who was seen today for physical therapy evaluation and treatment for low back pain with RLE referral ongoing a bit over a month. Red flag screening reassuring overall, did educate pt on monitoring for red flags and continue discussing LE edema with providers. On exam, pt demonstrates notably flexed posture in sitting and standing, has difficulty/pain with achieving neutral extension and same difficulty with sidebending towards R. Excellent LE strength bilaterally and neuro screening reassuring.  Increased time is spent w/ pt subjective/exam and education, he verbalizes agreement/understanding with plan. No adverse events, pt denies any change in pain on departure. Recommend trial of skilled PT to address aforementioned deficits with aim of improving functional tolerance and reducing pain with typical activities. Pt departs today's session in no acute distress, all voiced concerns/questions addressed appropriately from PT perspective.    OBJECTIVE IMPAIRMENTS: Abnormal gait, decreased activity tolerance, decreased balance, decreased endurance, decreased mobility, difficulty walking, decreased ROM, improper body mechanics, postural dysfunction, and pain.   ACTIVITY LIMITATIONS: carrying, lifting, sitting, standing, squatting, stairs, transfers, bed mobility, and locomotion level  PARTICIPATION LIMITATIONS: meal prep, cleaning, laundry, driving, shopping, and community activity  PERSONAL FACTORS: Age, Time since onset of injury/illness/exacerbation, and 1-2 comorbidities: HTN, COPD  are also affecting patient's functional outcome.   REHAB POTENTIAL: Good  CLINICAL DECISION MAKING: Evolving/moderate complexity  EVALUATION COMPLEXITY: Moderate   GOALS: Goals reviewed with patient? Yes  SHORT TERM GOALS: Target date: 11/20/2022 Pt will demonstrate appropriate understanding and performance of initially prescribed HEP  in order to facilitate improved independence with management of symptoms.  Baseline: HEP TBD 11/20/22: limited HEP adherence reported Goal status: NOT MET  2. Pt will score greater than or equal to 66 on FOTO in order to demonstrate improved perception of function due to symptoms.  Baseline: 61  11/20/22: deferred given visit 3  Goal status: ONGOING  LONG TERM GOALS: Target date: 12/11/2022 Pt will score 72 on FOTO in order to demonstrate improved perception of functional status due to symptoms.  Baseline: 61 Goal status: INITIAL  2.  Pt will demonstrate at least 50% normal lumbar extension AROM in order  to demonstrate improved tolerance to functional movement patterns.  Baseline: unable to tolerate past neutral due to pain Goal status: INITIAL  3.  Pt will report ability to sleep through the night without waking due to pain in order to return to PLOF and maximize overall health/QOL.  Baseline: waking 2-3x/night on average Goal status: INITIAL  4. Pt will perform 5xSTS in <16 sec in order to demonstrate reduced fall risk and improved functional independence. (MCID of 2.3sec)  Baseline: 28sec with UE support  Goal status: INITIAL   5. Pt will report at least 50% decrease in overall pain levels in past week in order to facilitate improved tolerance to basic ADLs/mobility.   Baseline: 0-10/10  Goal status: INITIAL    PLAN:  PT FREQUENCY: 1x/week  PT DURATION: 6 weeks  PLANNED INTERVENTIONS: 97164- PT Re-evaluation, 97110-Therapeutic exercises, 97530- Therapeutic activity, 97112- Neuromuscular re-education, 97535- Self Care, 16109- Manual therapy, [phone]- Gait training, [phone]- Aquatic Therapy, Patient/Family education, Balance training, Stair training, Taping, Dry Needling, Joint mobilization, Cryotherapy, and Moist heat.  PLAN FOR NEXT SESSION: Review/update HEP PRN. Work on [employer] exercises as appropriate with emphasis on gradually restoring extension tolerance and reducing  lateral shift. Repeated extension in prone prone progression from elbows if possible. MHP PRN for pain,   Ashley Murrain PT, DPT 11/20/2022 10:24 AM

## 2022-11-20 ENCOUNTER — Ambulatory Visit: Payer: Medicare HMO | Attending: Family Medicine | Admitting: Physical Therapy

## 2022-11-20 ENCOUNTER — Encounter: Payer: Self-pay | Admitting: Physical Therapy

## 2022-11-20 DIAGNOSIS — R293 Abnormal posture: Secondary | ICD-10-CM | POA: Diagnosis not present

## 2022-11-20 DIAGNOSIS — M5459 Other low back pain: Secondary | ICD-10-CM | POA: Diagnosis not present

## 2022-11-20 DIAGNOSIS — R2689 Other abnormalities of gait and mobility: Secondary | ICD-10-CM | POA: Insufficient documentation

## 2022-11-26 NOTE — Therapy (Addendum)
OUTPATIENT PHYSICAL THERAPY TREATMENT + NO VISIT DISCHARGE SUMMARY (see below)    Patient Name: [name] MRN: [id number] DOB:Jul 07, 1955, 67 y.o., male Today's Date: 11/27/2022  END OF SESSION:  PT End of Session - 11/27/22 0847     Visit Number 4    Number of Visits 7    Date for PT Re-Evaluation 12/11/22    Authorization Type [employer] medicare/medicaid    Authorization Time Period 10/21 - 11/21/22, requesting more visits    Authorization - Visit Number 3    Authorization - Number of Visits 6    Progress Note Due on Visit 10    PT Start Time 0847    PT Stop Time 0925    PT Time Calculation (min) 38 min    Activity Tolerance Patient tolerated treatment well;No increased pain    Behavior During Therapy [facility] for tasks assessed/performed                Past Medical History:  Diagnosis Date   Asthma    COPD (chronic obstructive pulmonary disease) (HCC)    Hx of adenomatous polyp of colon 01/13/2020   Hypertension    Past Surgical History:  Procedure Laterality Date   HAND SURGERY Bilateral left 4098,1191 Rt   Patient Active Problem List   Diagnosis Date Noted   Medication therapy management recommendation declined by patient 10/10/2022   Hyperlipidemia 05/22/2022   HCAP (healthcare-associated pneumonia) 05/21/2022   Acute respiratory distress 05/11/2022   CAP (community acquired pneumonia) 05/10/2022   Hx of adenomatous polyp of colon 01/13/2020   COPD with acute exacerbation (HCC) 11/23/2016   Diabetes mellitus screening 03/12/2016   Essential hypertension 10/22/2013   Elevated BP 07/19/2013   Obesity 02/04/2012   Acute respiratory failure with hypoxia (HCC) 02/04/2012   Tobacco abuse 02/03/2012    PCP: Hoy Register, MD  REFERRING PROVIDER: Hoy Register, MD  REFERRING DIAG: M54.31 (ICD-10-CM) - Sciatica of right side  Rationale for Evaluation and Treatment: Rehabilitation  THERAPY DIAG:  Other low back pain  Other abnormalities of  gait and mobility  Abnormal posture  ONSET DATE: a bit over a month  SUBJECTIVE:                                                                                                                                                                                           SUBJECTIVE STATEMENT: Pt states he had to move a lot of things around the house all weekend and having increased pain during and after, about 7/10 in pain at present. States he felt about the same as usual after last session. Hasn't been using cane.  Still having difficulty standing straight up.     PERTINENT HISTORY:  HTN, COPD  PAIN:  Are you having pain: 7/10 in LE, mild discomfort in low back Location/description: RLE - began in calf, spread proximally to low back. Mild tingling Best-worst over past week: 0-10/10 - aggravating factors: sitting straight, also varies by positioning. Standing/walking straight - Easing factors: medication, stretching, leaning   PRECAUTIONS: None  WEIGHT BEARING RESTRICTIONS: No  FALLS:  Has patient fallen in last 6 months? Yes. Number of falls 1 fall - about 3-4 months ago, fell off ladder, pt denies any issues /injuries afterwards  LIVING ENVIRONMENT: Lives alone, 1 story home, no STE Has fiancee who sometimes helps with house work as needed but pt states he typically performs himself  OCCUPATION: not currently  PLOF: Independent  PATIENT GOALS: back to normal   NEXT MD VISIT: 6 months per pt   OBJECTIVE:  Note: Objective measures were completed at Evaluation unless otherwise noted.  DIAGNOSTIC FINDINGS:  10/21/22 lumbar XR in epic: "4 view radiographs of lumbar spine exhibit straightening of normal  lordosis, no spondyloloisthesis, lower lumbar facet arthropathy, foraminal  stenosis at L4-L5 and L5-S1. No fractures or disclocations. "  PATIENT SURVEYS:  FOTO 61 current, 72 predicted  SCREENING FOR RED FLAGS: Red flag screening reassuring overall - bowel/bladder  changes: denies lack of control, endorses increased frequency of urination that does not seem to be worsening per pt description - bucking/overt weakness: none recently - endorses some chronic difficulty with bringing R foot up but no episodes of leg giving out or overt buckling - fevers/night sweats: no new changes - endorses "sweating in my sleep as long as I can remember" - bilateral LE numbness or saddle anesthesia: denies  COGNITION: Overall cognitive status: Within functional limits for tasks assessed    SENSATION/NEURO: Light touch intact all extremities   No clonus either LE Negative hoffmann and tromner sign BIL No ataxia with gait   POSTURE: forward flexed trunk in sitting and standing, in sitting pt demos lateral shift away from R and pain when he corrects  PALPATION/OBSERVATION: Palpation deferred given time constraints BIL foot swelling L>R, nontender throughout feet and calves, no erythema and skin appears normal.  LUMBAR ROM:   AROM eval 11/27/22  Flexion Mid shin painless   Extension Pain with neutral Just past neutral with pain  Right lateral flexion 25% *    Left lateral flexion To knee joint   Right rotation    Left rotation     (Blank rows = not tested) (Key: WFL = within functional limits not formally assessed, * = concordant pain, s = stiffness/stretching sensation, NT = not tested)  Comments: R lateral flexion and extension both provoke back pain and RLE symptoms  LOWER EXTREMITY ROM:     Active  Right eval Left eval  Hip flexion    Hip extension    Hip abduction    Hip adduction    Hip internal rotation    Hip external rotation    Knee flexion    Knee extension    Ankle dorsiflexion    Ankle plantarflexion    Ankle inversion    Ankle eversion     (Blank rows = not tested)  LOWER EXTREMITY MMT:    MMT Right eval Left eval  Hip flexion 5 5  Hip extension    Hip abduction (seated) 5 5  Hip adduction    Hip internal rotation    Hip  external rotation  Knee flexion 5 5  Knee extension 5 5  Ankle dorsiflexion 5 5  Ankle plantarflexion    Ankle inversion    Ankle eversion     (Blank rows = not tested) (Key: WFL = within functional limits not formally assessed, * = concordant pain, s = stiffness/stretching sensation, NT = not tested)  Comments: all MMT painless  LUMBAR SPECIAL TESTS:  deferred  FUNCTIONAL TESTS:  5xSTS 28sec with UE support (pt does remain standing for a few seconds prior to descent on last rep due to reported relief, returns to baseline with sitting)   GAIT: Distance walked: within clinic Assistive device utilized: Single point cane Level of assistance: Modified independence Comments: antalgic gait RLE, fwd flexed posture, minimal trunk rotation and reduced hip extension  TODAY'S TREATMENT:                                                                                                                              [facility] Adult PT Treatment:                                                DATE: 11/27/22 Therapeutic Exercise: Scapular retraction 2x12 cues for posture  STS 2x8 cues for posture and pacing Hip extension w/ UE support RLE only x12 cues for posture LAQ + DF x5 RLE Standing passive lumbar extension at wall x8 Seated thoracolumbar extension x8 HEP review/education    [facility] Adult PT Treatment:                                                DATE: 11/20/22 Therapeutic Exercise: Passive lumbar extension at wall 2x5 cues for comfortable ROM Gastroc stretch at wall 2x8 gentle rocking  LAQ + DF 2x8  RLE hip extension at counter 2x10 cues for trunk posture  Seated scapular retraction + CKC scapular depression 2x12 cues for posture and extension  Seated thoracolumbar extension 2x5 HEP handout + education    [facility] Adult PT Treatment:                                                DATE: 11/07/2022 Therapeutic Exercise: Prone over 3 pillows under hips - trialed prone press up unable to  tolerate so focused on just laying prone with MHP on back. No changes in RLE referred symptoms Modified to 2 pillows with combined with manual PA lumbar mobs pt noted centralization to the R glute med Progressed to 1 pillow conitnued with manual PA lumbar mobs Progressed to 1 pillow with prone on elbows and pt  noted continued centralization  Provided initial HEP  Manual Therapy: L4-L5 PA grade III performed concordant to prone posiition/ extension treatment Therapeutic Activity: Reviewed anatomy of the lumbar spine and effect of his current lumbar positioning and lifting mechanics have in relation to the LE referred symptoms.  Modalities: MHP with pt in prone concordant to extension activities to ease low back pain x 15 min Self Care: Reviewed posture and lifting mechanics and importance of lifting with hips and knees to reduce issues on the back and disc.     PATIENT EDUCATION:  Education details: rationale for interventions, HEP  Person educated: Patient Education method: Explanation, Demonstration, Tactile cues, Verbal cues Education comprehension: verbalized understanding, returned demonstration, verbal cues required, tactile cues required, and needs further education    HOME EXERCISE PROGRAM: Access Code: [email or web address] URL: [email or web address] Date: 11/20/2022 Prepared by: Fransisco Hertz  Exercises - Standing Lumbar Extension at Wall - Forearms  - 1 x daily - 7 x weekly - 3 sets - 5 reps - Sit to Stand with Armchair  - 1 x daily - 7 x weekly - 3 sets - 5 reps - Standing Hip Extension with Counter Support  - 1 x daily - 7 x weekly - 2 sets - 10 reps  ASSESSMENT:  CLINICAL IMPRESSION: 11/27/2022 Pt arrives w/ increased pain after moving things around the house this weekend, felt good after last session. Today continuing to work into postural extension which pt tolerates well. No adverse events. Back pain and posture seem to be responding reasonably to treatment thus  far although pt states leg pain remains about the same overall. Departs without any overt change in symptoms. Recommend continuing along current POC in order to address relevant deficits and improve functional tolerance. Pt departs today's session in no acute distress, all voiced questions/concerns addressed appropriately from PT perspective.      ASSESSMENT AT EVALUATION: Patient is a 67 y.o. gentleman who was seen today for physical therapy evaluation and treatment for low back pain with RLE referral ongoing a bit over a month. Red flag screening reassuring overall, did educate pt on monitoring for red flags and continue discussing LE edema with providers. On exam, pt demonstrates notably flexed posture in sitting and standing, has difficulty/pain with achieving neutral extension and same difficulty with sidebending towards R. Excellent LE strength bilaterally and neuro screening reassuring.  Increased time is spent w/ pt subjective/exam and education, he verbalizes agreement/understanding with plan. No adverse events, pt denies any change in pain on departure. Recommend trial of skilled PT to address aforementioned deficits with aim of improving functional tolerance and reducing pain with typical activities. Pt departs today's session in no acute distress, all voiced concerns/questions addressed appropriately from PT perspective.    OBJECTIVE IMPAIRMENTS: Abnormal gait, decreased activity tolerance, decreased balance, decreased endurance, decreased mobility, difficulty walking, decreased ROM, improper body mechanics, postural dysfunction, and pain.   ACTIVITY LIMITATIONS: carrying, lifting, sitting, standing, squatting, stairs, transfers, bed mobility, and locomotion level  PARTICIPATION LIMITATIONS: meal prep, cleaning, laundry, driving, shopping, and community activity  PERSONAL FACTORS: Age, Time since onset of injury/illness/exacerbation, and 1-2 comorbidities: HTN, COPD  are also affecting  patient's functional outcome.   REHAB POTENTIAL: Good  CLINICAL DECISION MAKING: Evolving/moderate complexity  EVALUATION COMPLEXITY: Moderate   GOALS: Goals reviewed with patient? Yes  SHORT TERM GOALS: Target date: 11/20/2022 Pt will demonstrate appropriate understanding and performance of initially prescribed HEP in order to facilitate improved independence with management of symptoms.  Baseline: HEP TBD 11/20/22:  limited HEP adherence reported Goal status: NOT MET  2. Pt will score greater than or equal to 66 on FOTO in order to demonstrate improved perception of function due to symptoms.  Baseline: 61  11/20/22: deferred given visit 3  Goal status: ongoing  LONG TERM GOALS: Target date: 12/11/2022 Pt will score 72 on FOTO in order to demonstrate improved perception of functional status due to symptoms.  Baseline: 61 11/27/22: deferred given visit 4  Goal status: ONGOING  2.  Pt will demonstrate at least 50% normal lumbar extension AROM in order to demonstrate improved tolerance to functional movement patterns.  Baseline: unable to tolerate past neutral due to pain 11/27/22: able to get past neutral with increased pain  Goal status: PROGRESSING  3.  Pt will report ability to sleep through the night without waking due to pain in order to return to PLOF and maximize overall health/QOL.  Baseline: waking 2-3x/night on average 11/27/22: continues to endorse sleep patterns about the same  Goal status: ONGOING  4. Pt will perform 5xSTS in <16 sec in order to demonstrate reduced fall risk and improved functional independence. (MCID of 2.3sec)  Baseline: 28sec with UE support  11/27/22: deferred formal 5xSTS, is able to increase volume with STS training   Goal status: ONGOING  5. Pt will report at least 50% decrease in overall pain levels in past week in order to facilitate improved tolerance to basic ADLs/mobility.   Baseline: 0-10/10  11/27/22: 7/10 today   Goal status:  ONGOING   PLAN:  PT FREQUENCY: 1x/week  PT DURATION: 6 weeks  PLANNED INTERVENTIONS: 97164- PT Re-evaluation, 97110-Therapeutic exercises, 97530- Therapeutic activity, 97112- Neuromuscular re-education, 97535- Self Care, 44034- Manual therapy, [phone]- Gait training, [phone]- Aquatic Therapy, Patient/Family education, Balance training, Stair training, Taping, Dry Needling, Joint mobilization, Cryotherapy, and Moist heat.  PLAN FOR NEXT SESSION: Review/update HEP PRN. Work on [employer] exercises as appropriate with emphasis on gradually restoring extension tolerance and reducing lateral shift. Functional mobility and STS training as tolerated  Ashley Murrain PT, DPT 11/27/2022 10:37 AM     Francine Graven Berkley Harvey: Referring diagnosis? M54.31 (ICD-10-CM) - Sciatica of right side Treatment diagnosis? (if different than referring diagnosis) Other low back pain   Other abnormalities of gait and mobility   Abnormal posture   What was this (referring dx) caused by? []  Surgery []  Fall []  Ongoing issue []  Arthritis [x]  Other: ______acute issue, no recalled injury______   Laterality: [x]  Rt []  Lt []  Both   Check all possible CPT codes:                   *CHOOSE 10 OR LESS*                                []  97110 (Therapeutic Exercise)                   []  92507 (SLP Treatment)      []  97112 (Neuro Re-ed)                                []  92526 (Swallowing Treatment)                  []  56433 (Gait Training)                                []   16109 (Cognitive Training, 1st 15 minutes) []  97140 (Manual Therapy)                           []  97130 (Cognitive Training, each add'l 15 minutes)         []  97164 (Re-evaluation)                              []  Other, List CPT Code ____________  []  97530 (Therapeutic Activities)                                            []  60454 (Self Care)               [x]  All codes above (97110 - 97535)             []  97012 (Mechanical Traction)             []  97014  (E-stim Unattended)             []  97032 (E-stim manual)             []  97033 (Ionto)             []  97035 (Ultrasound) []  97750 (Physical Performance Training) [x]  [id number] (Aquatic Therapy) []  97016 (Vasopneumatic Device) []  [id number] (Paraffin) []  97034 (Contrast Bath) []  97597 (Wound Care 1st 20 sq cm) []  97598 (Wound Care each add'l 20 sq cm) []  97760 (Orthotic Fabrication, Fitting, Training Initial) []  [id number] (Prosthetic Management and Training Initial) []  [id number] (Orthotic or Prosthetic Training/ Modification Subsequent)   PHYSICAL THERAPY DISCHARGE SUMMARY  Visits from Start of Care: 4  Current functional level related to goals / functional outcomes: Unable to assess   Remaining deficits: Unable to assess   Education / Equipment: Unable to fully assess - pt denies any questions/concerns at time of discharge conversation (see phone call 12/17/22)  Patient agrees to discharge. Patient goals were  unable to be assessed . Patient is being discharged due to the patient's request (advised on need for new referral on 12/17/22 given hospitalization, pt states he would like to proceed with discharge and cancel 12/18/22 visit, has been doing his own exercises which he states have been helpful).     Ashley Murrain PT, DPT 12/17/2022 2:22 PM

## 2022-11-27 ENCOUNTER — Ambulatory Visit: Payer: Self-pay

## 2022-11-27 ENCOUNTER — Ambulatory Visit: Payer: Medicare HMO | Admitting: Physical Therapy

## 2022-11-27 ENCOUNTER — Encounter: Payer: Self-pay | Admitting: Physical Therapy

## 2022-11-27 DIAGNOSIS — M5459 Other low back pain: Secondary | ICD-10-CM

## 2022-11-27 DIAGNOSIS — R293 Abnormal posture: Secondary | ICD-10-CM | POA: Diagnosis not present

## 2022-11-27 DIAGNOSIS — R2689 Other abnormalities of gait and mobility: Secondary | ICD-10-CM | POA: Diagnosis not present

## 2022-11-27 NOTE — Telephone Encounter (Signed)
Chief Complaint: Leg Pain  Symptoms: Right leg pain, radiating to the hip  Frequency: Constant  Pertinent Negatives: Patient denies calf swelling, chest pain, difficulty breathing Disposition: [] ED /[] Urgent Care (no appt availability in office) / [] Appointment(In office/virtual)/ []  [facility]/ [] Home Care/ [] Refused Recommended Disposition /[] [facility]/ [x]  Follow-up with PCP Additional Notes: Patient states he continues to have right leg pain that is not improving at all. Patient states he can barely stand up straight without experiencing severe pain. Patient reports going to physical therapy 3 times a week and PT recommended imaging of the leg. Patient states the pain is getting unbearable. Care advice was given and no appointments available with PCP until February 2025. No appt available in office until 12/19/22. Patient is requesting an order to have an x-ray done. Advised patient I would forward request to PCP for additional recommendations. Dx with sciatica pain on 10/10/22.   Reason for Disposition  [1] MODERATE pain (e.g., interferes with normal activities, limping) AND [2] present > 3 days  Answer Assessment - Initial Assessment Questions 1. ONSET: "When did the pain start?"      July  2. LOCATION: "Where is the pain located?"     Right Leg  3. PAIN: "How bad is the pain?"    (Scale 1-10; or mild, moderate, severe)   -  MILD (1-3): doesn't interfere with normal activities    -  MODERATE (4-7): interferes with normal activities (e.g., work or school) or awakens from sleep, limping    -  SEVERE (8-10): excruciating pain, unable to do any normal activities, unable to walk     7/10 4. WORK OR EXERCISE: "Has there been any recent work or exercise that involved this part of the body?"      No  5. CAUSE: "What do you think is causing the leg pain?"     I fell in July off a ladder  6. OTHER SYMPTOMS: "Do you have any other symptoms?" (e.g., chest pain, back pain,  breathing difficulty, swelling, rash, fever, numbness, weakness)     Hip pain  Protocols used: Leg Pain-A-AH

## 2022-11-27 NOTE — Telephone Encounter (Signed)
Spoke with patient . Patient has agreed to go to [facility] on Tuesday 01/02/2023.

## 2022-12-02 DIAGNOSIS — F17210 Nicotine dependence, cigarettes, uncomplicated: Secondary | ICD-10-CM | POA: Diagnosis not present

## 2022-12-02 DIAGNOSIS — J441 Chronic obstructive pulmonary disease with (acute) exacerbation: Secondary | ICD-10-CM | POA: Diagnosis not present

## 2022-12-02 DIAGNOSIS — E669 Obesity, unspecified: Secondary | ICD-10-CM | POA: Diagnosis not present

## 2022-12-02 DIAGNOSIS — Z6833 Body mass index (BMI) 33.0-33.9, adult: Secondary | ICD-10-CM | POA: Diagnosis not present

## 2022-12-02 DIAGNOSIS — Z0000 Encounter for general adult medical examination without abnormal findings: Secondary | ICD-10-CM | POA: Diagnosis not present

## 2022-12-02 DIAGNOSIS — I509 Heart failure, unspecified: Secondary | ICD-10-CM | POA: Diagnosis not present

## 2022-12-02 DIAGNOSIS — I110 Hypertensive heart disease with heart failure: Secondary | ICD-10-CM | POA: Diagnosis not present

## 2022-12-03 ENCOUNTER — Ambulatory Visit (HOSPITAL_COMMUNITY)
Admission: RE | Admit: 2022-12-03 | Discharge: 2022-12-03 | Disposition: A | Discharge status: Home / Self Care | Payer: Medicare HMO | Source: Ambulatory Visit | Attending: Physician Assistant | Admitting: Physician Assistant

## 2022-12-03 ENCOUNTER — Ambulatory Visit: Payer: Medicare HMO | Admitting: Physician Assistant

## 2022-12-03 ENCOUNTER — Encounter: Payer: Self-pay | Admitting: Physician Assistant

## 2022-12-03 VITALS — BP 149/88 | HR 82 | Ht 68.0 in | Wt 224.0 lb

## 2022-12-03 DIAGNOSIS — Z125 Encounter for screening for malignant neoplasm of prostate: Secondary | ICD-10-CM | POA: Diagnosis not present

## 2022-12-03 DIAGNOSIS — M25551 Pain in right hip: Secondary | ICD-10-CM | POA: Diagnosis not present

## 2022-12-03 DIAGNOSIS — F17210 Nicotine dependence, cigarettes, uncomplicated: Secondary | ICD-10-CM

## 2022-12-03 DIAGNOSIS — I878 Other specified disorders of veins: Secondary | ICD-10-CM | POA: Diagnosis not present

## 2022-12-03 DIAGNOSIS — I10 Essential (primary) hypertension: Secondary | ICD-10-CM | POA: Diagnosis not present

## 2022-12-03 DIAGNOSIS — R739 Hyperglycemia, unspecified: Secondary | ICD-10-CM

## 2022-12-03 DIAGNOSIS — M79661 Pain in right lower leg: Secondary | ICD-10-CM | POA: Insufficient documentation

## 2022-12-03 DIAGNOSIS — M76891 Other specified enthesopathies of right lower limb, excluding foot: Secondary | ICD-10-CM | POA: Diagnosis not present

## 2022-12-03 DIAGNOSIS — M160 Bilateral primary osteoarthritis of hip: Secondary | ICD-10-CM | POA: Diagnosis not present

## 2022-12-03 NOTE — Patient Instructions (Addendum)
Your blood pressure is elevated today, I do encourage you to check your blood pressure at home, keep a written log and have available for all office visits.  I strongly encourage you to increase your water intake, you should be drinking at least 64 ounces of water a day.  We will call you with today's lab results and x-ray results as soon as they are available.  Roney Jaffe, PA-C Physician Assistant [facility] [email or web address]   How to Take Your Blood Pressure Blood pressure is a measurement of how strongly your blood is pressing against the walls of your arteries. Arteries are blood vessels that carry blood from your heart throughout your body. Your health care provider takes your blood pressure at each office visit. You can also take your own blood pressure at home with a blood pressure monitor. You may need to take your own blood pressure to: Confirm a diagnosis of high blood pressure (hypertension). Monitor your blood pressure over time. Make sure your blood pressure medicine is working. Supplies needed: Blood pressure monitor. A chair to sit in. This should be a chair where you can sit upright with your back supported. Do not sit on a soft couch or an armchair. Table or desk. Small notebook and pencil or pen. How to prepare To get the most accurate reading, avoid the following for 30 minutes before you check your blood pressure: Drinking caffeine. Drinking alcohol. Eating. Smoking. Exercising. Five minutes before you check your blood pressure: Use the bathroom and urinate so that you have an empty bladder. Sit quietly in a chair. Do not talk. How to take your blood pressure To check your blood pressure, follow the instructions in the manual that came with your blood pressure monitor. If you have a digital blood pressure monitor, the instructions may be as follows: Sit up straight in a chair. Place your feet on  the floor. Do not cross your ankles or legs. Rest your left arm at the level of your heart on a table or desk or on the arm of a chair. Pull up your shirt sleeve. Wrap the blood pressure cuff around the upper part of your left arm, 1 inch (2.5 cm) above your elbow. It is best to wrap the cuff around bare skin. Fit the cuff snugly, but not too tightly, around your arm. You should be able to place only one finger between the cuff and your arm. Position the cord so that it rests in the bend of your elbow. Press the power button. Sit quietly while the cuff inflates and deflates. Read the digital reading on the monitor screen and write the numbers down (record them) in a notebook. Wait 2-3 minutes, then repeat the steps, starting at step 1. What does my blood pressure reading mean? A blood pressure reading consists of a higher number over a lower number. Ideally, your blood pressure should be below 120/80. The first ("top") number is called the systolic pressure. It is a measure of the pressure in your arteries as your heart beats. The second ("bottom") number is called the diastolic pressure. It is a measure of the pressure in your arteries as the heart relaxes. Blood pressure is classified into four stages. The following are the stages for adults who do not have a short-term serious illness or a chronic condition. Systolic pressure and diastolic pressure are measured in a unit called mm Hg (millimeters of mercury).  Normal Systolic pressure: below 120. Diastolic pressure: below 80. Elevated Systolic  pressure: 120-129. Diastolic pressure: below 80. Hypertension stage 1 Systolic pressure: 130-139. Diastolic pressure: 80-89. Hypertension stage 2 Systolic pressure: 140 or above. Diastolic pressure: 90 or above. You can have elevated blood pressure or hypertension even if only the systolic or only the diastolic number in your reading is higher than normal. Follow these instructions at  home: Medicines Take over-the-counter and prescription medicines only as told by your health care provider. Tell your health care provider if you are having any side effects from blood pressure medicine. General instructions Check your blood pressure as often as recommended by your health care provider. Check your blood pressure at the same time every day. Take your monitor to the next appointment with your health care provider to make sure that: You are using it correctly. It provides accurate readings. Understand what your goal blood pressure numbers are. Keep all follow-up visits. This is important. General tips Your health care provider can suggest a reliable monitor that will meet your needs. There are several types of home blood pressure monitors. Choose a monitor that has an arm cuff. Do not choose a monitor that measures your blood pressure from your wrist or finger. Choose a cuff that wraps snugly, not too tight or too loose, around your upper arm. You should be able to fit only one finger between your arm and the cuff. You can buy a blood pressure monitor at most drugstores or online. Where to find more information American Heart Association: www.heart.org Contact a health care provider if: Your blood pressure is consistently high. Your blood pressure is suddenly low. Get help right away if: Your systolic blood pressure is higher than 180. Your diastolic blood pressure is higher than 120. These symptoms may be an emergency. Get help right away. Call 911. Do not wait to see if the symptoms will go away. Do not drive yourself to the hospital. Summary Blood pressure is a measurement of how strongly your blood is pressing against the walls of your arteries. A blood pressure reading consists of a higher number over a lower number. Ideally, your blood pressure should be below 120/80. Check your blood pressure at the same time every day. Avoid caffeine, alcohol, smoking, and  exercise for 30 minutes prior to checking your blood pressure. These agents can affect the accuracy of the blood pressure reading. This information is not intended to replace advice given to you by your health care provider. Make sure you discuss any questions you have with your health care provider. Document Revised: 09/14/2020 Document Reviewed: 09/14/2020 Elsevier Patient Education  2024 [employer].

## 2022-12-03 NOTE — Progress Notes (Unsigned)
Established Patient Office Visit  Subjective   Patient ID: [name], male    DOB: [birth year]  Age: 67 y.o. MRN: [id number]  Chief Complaint  Patient presents with   Leg Pain    Pain in right calf, hip and back when he tries to stand straight up. He is currently in Physical therapy last visit 11.13.2024 Main concerns is his calf pain, he feels it could be a hair line fracture or torn muscle.     HPI   States that he continues to have pain in his right lower leg and his right hip.    States that the pain becomes "unbearable" if he stands too long, sits too long, or lays down too long.  Describes the pain as aching.  States that he has been seen by orthopedics, states that he has been attending physical therapy.  States that he has also been doing stretching without relief.  States that he will use arthritis Tylenol and ibuprofen "just to get through the night"  States that he tried the muscle relaxers that were prescribed but they did not offer relief.  States that he does not drink any water on a daily basis.  States that he does not check his blood pressure at home, states that he has previously been told that his blood pressure is elevated but he does "not want to put chemicals in his body" and wants to work on controlling his blood pressure on his own.  States that he has been working on reducing smoking, states that he was previously smoking 2 packs a day and is down to 10 cigarettes a day.  States that he smokes because of elevated stress.  Notes from PT and Ortho visits:  SUBJECTIVE STATEMENT: Pt states he had to move a lot of things around the house all weekend and having increased pain during and after, about 7/10 in pain at present. States he felt about the same as usual after last session. Hasn't been using cane. Still having difficulty standing straight up.  DIAGNOSTIC FINDINGS:  10/21/22 lumbar XR in epic: "4 view radiographs of lumbar spine exhibit  straightening of normal  lordosis, no spondyloloisthesis, lower lumbar facet arthropathy, foraminal  stenosis at L4-L5 and L5-S1. No fractures or disclocations. " ASSESSMENT:   CLINICAL IMPRESSION: 11/27/2022 Pt arrives w/ increased pain after moving things around the house this weekend, felt good after last session. Today continuing to work into postural extension which pt tolerates well. No adverse events. Back pain and posture seem to be responding reasonably to treatment thus far although pt states leg pain remains about the same overall. Departs without any overt change in symptoms. Recommend continuing along current POC in order to address relevant deficits and improve functional tolerance. Pt departs today's session in no acute distress, all voiced questions/concerns addressed appropriately from PT perspective Ortho 10/21/22 Plan: Findings:  Acute pain to right lateral calf region radiating up to knee and right lower back. Patient continues to have severe pain, he has tried medications and rest without significant relief of pain. Patients clinical presentation and exam are consistent with L5 radiculopathy. I obtained lumbar x-rays in the office today that show straightening of lumbar spine, lower lumbar arthritis and facet narrowing. We discussed treatment plan in detail today. Given more acute duration of his symptoms we recommend more conservative treatments such as medications and regimen of formal physical therapy. He is not interested in taking medications and does not plan to proceed with PT treatments.  We are also willing to try lumbar epidural steroid injection diagnostically to help alleviate his pain, however he does not wish to proceed with injection at this time. We could try to get lumbar MRI imaging approved, however this is not typically recommended with acute onset of pain without injury. At this point, we recommend patient follow up with his primary care provider to address acute pain  and manage chronic medical issues. We are happy to see patient back as needed. No red flag symptoms noted upon exam today.           Past Medical History:  Diagnosis Date   Asthma    COPD (chronic obstructive pulmonary disease) (HCC)    Hx of adenomatous polyp of colon 01/13/2020   Hypertension    Social History   Socioeconomic History   Marital status: Significant Other    Spouse name: Not on file   Number of children: Not on file   Years of education: Not on file   Highest education level: Not on file  Occupational History   Not on file  Tobacco Use   Smoking status: Every Day    Current packs/day: 1.00    Average packs/day: 1 pack/day for 45.0 years (45.0 ttl pk-yrs)    Types: Cigarettes   Smokeless tobacco: Never  Vaping Use   Vaping status: Never Used  Substance and Sexual Activity   Alcohol use: Yes    Alcohol/week: 1.0 standard drink of alcohol    Types: 1 Standard drinks or equivalent per week    Comment: "every once in awhile"-3 times a month per pt   Drug use: Yes    Frequency: 1.0 times per week    Types: Marijuana    Comment: occasionally marijuana-3 times a month per pt   Sexual activity: Not on file  Other Topics Concern   Not on file  Social History Narrative   Not on file   Social Determinants of Health   Financial Resource Strain: Medium Risk (05/06/2022)   Overall Financial Resource Strain (CARDIA)    Difficulty of Paying Living Expenses: Somewhat hard  Food Insecurity: No Food Insecurity (05/22/2022)   Hunger Vital Sign    Worried About Running Out of Food in the Last Year: Never true    Ran Out of Food in the Last Year: Never true  Transportation Needs: No Transportation Needs (05/22/2022)   PRAPARE - Administrator, Civil Service (Medical): No    Lack of Transportation (Non-Medical): No  Physical Activity: Inactive (05/06/2022)   Exercise Vital Sign    Days of Exercise per Week: 0 days    Minutes of Exercise per Session: 0 min   Stress: No Stress Concern Present (05/06/2022)   [employer] of Occupational Health - Occupational Stress Questionnaire    Feeling of Stress : Only a little  Social Connections: Moderately Integrated (05/06/2022)   Social Connection and Isolation Panel [NHANES]    Frequency of Communication with Friends and Family: More than three times a week    Frequency of Social Gatherings with Friends and Family: More than three times a week    Attends Religious Services: 1 to 4 times per year    Active Member of [employer] or Organizations: No    Attends Banker Meetings: Never    Marital Status: Living with partner  Intimate Partner Violence: Not At Risk (05/22/2022)   Humiliation, Afraid, Rape, and Kick questionnaire    Fear of Current or Ex-Partner: No  Emotionally Abused: No    Physically Abused: No    Sexually Abused: No   Family History  Problem Relation Age of Onset   Hypertension Paternal Uncle    Cancer Maternal Grandmother    Hypertension Maternal Grandfather    Colon cancer Cousin    Colon polyps Neg Hx    Esophageal cancer Neg Hx    Stomach cancer Neg Hx    Rectal cancer Neg Hx    Allergies  Allergen Reactions   Codeine Itching   Penicillins Itching and Other (See Comments)    Review of Systems  Constitutional: Negative.   HENT: Negative.    Eyes: Negative.   Respiratory:  Negative for shortness of breath.   Cardiovascular:  Negative for chest pain.  Gastrointestinal: Negative.   Genitourinary:  Negative for dysuria, frequency and urgency.  Musculoskeletal:  Positive for back pain, joint pain and myalgias.  Skin: Negative.   Neurological: Negative.   Endo/Heme/Allergies: Negative.   Psychiatric/Behavioral: Negative.        Objective:     BP (!) 149/88 (BP Location: Left Arm, Patient Position: Sitting, Cuff Size: Large)   Pulse 82   Ht 5\' 8"  (1.727 m)   Wt 224 lb (101.6 kg)   SpO2 94%   BMI 34.06 kg/m  BP Readings from Last 3 Encounters:   12/03/22 (!) 149/88  10/10/22 (!) 175/75  10/08/22 (!) 144/70   Wt Readings from Last 3 Encounters:  12/03/22 224 lb (101.6 kg)  10/10/22 232 lb (105.2 kg)  10/08/22 231 lb (104.8 kg)    Physical Exam Vitals and nursing note reviewed.  Constitutional:      Appearance: Normal appearance.  HENT:     Head: Normocephalic and atraumatic.     Right Ear: External ear normal.     Left Ear: External ear normal.     Nose: Nose normal.     Mouth/Throat:     Mouth: Mucous membranes are moist.     Pharynx: Oropharynx is clear.  Eyes:     Extraocular Movements: Extraocular movements intact.     Conjunctiva/sclera: Conjunctivae normal.     Pupils: Pupils are equal, round, and reactive to light.  Cardiovascular:     Rate and Rhythm: Normal rate and regular rhythm.     Pulses: Normal pulses.     Heart sounds: Normal heart sounds.  Pulmonary:     Effort: Pulmonary effort is normal.     Breath sounds: Normal breath sounds.  Musculoskeletal:     Cervical back: Normal, normal range of motion and neck supple.     Thoracic back: Normal.     Lumbar back: Normal. Normal range of motion.     Right hip: No bony tenderness. Decreased range of motion.     Left hip: Normal.     Right knee: Normal.     Left knee: Normal.     Right lower leg: No swelling, tenderness or bony tenderness. No edema.     Left lower leg: Normal.  Skin:    General: Skin is warm and dry.  Neurological:     General: No focal deficit present.     Mental Status: He is alert and oriented to person, place, and time.  Psychiatric:        Mood and Affect: Mood normal.        Behavior: Behavior normal.        Thought Content: Thought content normal.        Judgment: Judgment normal.  Assessment & Plan:   Problem List Items Addressed This Visit       Cardiovascular and Mediastinum   Essential hypertension   Relevant Orders   Comp. Metabolic Panel (12) (Completed)   Other Visit Diagnoses     Pain in right  lower leg    -  Primary   Relevant Orders   DG Tibia/Fibula Right   Right hip pain       Relevant Orders   DG Hip Unilat W OR W/O Pelvis 2-3 Views Right   Screening PSA (prostate specific antigen)       Relevant Orders   PSA (Completed)   Elevated random blood glucose level       Relevant Orders   Hemoglobin A1c (Completed)   Elevated blood pressure reading in office with diagnosis of hypertension          1. Pain in right lower leg Patient declines pharmaceutical treatments, including injections today in the clinic.  Patient encouraged to continue with lifestyle modifications, including increasing water intake, stretching.  Continue with physical therapy.  Refer back to orthopedics as appropriate - DG Tibia/Fibula Right; Future  2. Right hip pain  - DG Hip Unilat W OR W/O Pelvis 2-3 Views Right; Future  3. Screening PSA (prostate specific antigen)  - PSA  4. Essential hypertension Patient declines pharmaceutical treatment.  Patient strongly encouraged to check blood pressure at home, keep a written log and have available for all office visits. - Comp. Metabolic Panel (12)  5. Elevated random blood glucose level Lab sent off, point-of-care machine unavailable at this time - Hemoglobin A1c  6. Elevated blood pressure reading in office with diagnosis of hypertension    I have reviewed the patient's medical history (PMH, PSH, Social History, Family History, Medications, and allergies) , and have been updated if relevant. I spent 30 minutes reviewing chart and  face to face time with patient.    Return if symptoms worsen or fail to improve.    Kasandra Knudsen Mayers, PA-C

## 2022-12-04 LAB — COMP. METABOLIC PANEL (12)
AST: 18 [IU]/L (ref 0–40)
Albumin: 4.3 g/dL (ref 3.9–4.9)
Alkaline Phosphatase: 100 [IU]/L (ref 44–121)
BUN/Creatinine Ratio: 12 (ref 10–24)
BUN: 11 mg/dL (ref 8–27)
Bilirubin Total: 0.6 mg/dL (ref 0.0–1.2)
Calcium: 8.8 mg/dL (ref 8.6–10.2)
Chloride: 102 mmol/L (ref 96–106)
Creatinine, Ser: 0.92 mg/dL (ref 0.76–1.27)
Globulin, Total: 1.5 g/dL (ref 1.5–4.5)
Glucose: 73 mg/dL (ref 70–99)
Potassium: 3.6 mmol/L (ref 3.5–5.2)
Sodium: 141 mmol/L (ref 134–144)
Total Protein: 5.8 g/dL — ABNORMAL LOW (ref 6.0–8.5)
eGFR: 92 mL/min/{1.73_m2} (ref >59)

## 2022-12-04 LAB — HEMOGLOBIN A1C

## 2022-12-04 LAB — PSA: Prostate Specific Ag, Serum: 0.5 ng/mL (ref 0.0–4.0)

## 2022-12-10 ENCOUNTER — Ambulatory Visit: Payer: Self-pay

## 2022-12-10 NOTE — Patient Instructions (Signed)
Visit Information  Thank you for taking time to visit with me today. Please don't hesitate to contact me if I can be of assistance to you.   Following are the goals we discussed today:   Goals Addressed             This Visit's Progress    To follow up with Pulmonology for evaluation of COPD   Not on track    Care Coordination Interventions: Evaluation of current treatment plan related to COPD  and patient's adherence to plan as established by provider Determined patient is experiencing increased shortness of breath and chest congestion x 2 weeks, patient states symptoms started after getting a cold Provided instruction about proper use of medications used for management of COPD including inhalers Discussed the importance of adequate rest and management of fatigue with COPD Discussed patient feels he is not getting adequate symptom relief from his prescribed inhalers Offered to contact patient's Pulmonologist to report symptoms and request an office visit, patient declines at this time, he wishes to drive himself to [facility] ED, he refuses to go by ambulance Sent in basket message to Dr. 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   If you are experiencing a Mental Health or Behavioral Health Crisis or need someone to talk to, please call 1-800-273-TALK (toll free, 24 hour hotline)  The patient verbalized understanding of instructions, educational materials, and care plan provided  today and DECLINED offer to receive copy of patient instructions, educational materials, and care plan.   Delsa Sale RN BSN CCM [place]  [facility], [facility] Nurse Care Coordinator  Direct Dial: [phone] Website: [email or web address]

## 2022-12-10 NOTE — Patient Outreach (Signed)
  Care Coordination   Follow Up Visit Note   12/10/2022 Name: [name] MRN: [id number] DOB: [birth year]  [name] is a 67 y.o. year old male who sees Hoy Register, MD for primary care. I spoke with  Vilinda Blanks by phone today.  What matters to the patients health and wellness today?  Patient feels he needs to go to [facility] ED for evaluation of his COPD.     Goals Addressed             This Visit's Progress    To follow up with Pulmonology for evaluation of COPD   Not on track    Care Coordination Interventions: Evaluation of current treatment plan related to COPD  and patient's adherence to plan as established by provider Determined patient is experiencing increased shortness of breath and chest congestion x 2 weeks, patient states symptoms started after getting a cold Provided instruction about proper use of medications used for management of COPD including inhalers Discussed the importance of adequate rest and management of fatigue with COPD Discussed patient feels he is not getting adequate symptom relief from his prescribed inhalers Offered to contact patient's Pulmonologist to report symptoms and request an office visit, patient declines at this time, he wishes to drive himself to [facility] ED, he refuses to go by ambulance Sent in basket message to Dr. 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  Patient will continue to work with nurse care coordinator for chronic disease management and care coordination needs     Interventions Today    Flowsheet Row Most Recent Value  Chronic Disease   Chronic disease during today's visit Chronic Obstructive Pulmonary Disease (COPD), Other  [lumbar spinal pain]  General Interventions    General Interventions Discussed/Reviewed General Interventions Discussed, General Interventions Reviewed, Doctor Visits, Communication with  Doctor Visits Discussed/Reviewed Doctor Visits Reviewed, Doctor Visits Discussed, PCP  Communication with PCP/Specialists  [Dr. Newlin]  Exercise Interventions   Exercise Discussed/Reviewed Exercise Reviewed, Exercise Discussed, Physical Activity  Physical Activity Discussed/Reviewed Physical Activity Reviewed, Physical Activity Discussed, Home Exercise Program (HEP)  Education Interventions   Education Provided Provided Education  Provided Verbal Education On Medication, When to see the doctor  Pharmacy Interventions   Pharmacy Dicussed/Reviewed Pharmacy Topics Discussed, Pharmacy Topics Reviewed, Medications and their functions, Medication Adherence          SDOH assessments and interventions completed:  No     Care Coordination Interventions:  Yes, provided   Follow up plan: Follow up call scheduled for 12/19/22 @1 :15 PM    Encounter Outcome:  Patient Visit Completed

## 2022-12-12 ENCOUNTER — Emergency Department (HOSPITAL_COMMUNITY): Payer: Medicare HMO

## 2022-12-12 ENCOUNTER — Encounter (HOSPITAL_COMMUNITY): Payer: Self-pay

## 2022-12-12 ENCOUNTER — Inpatient Hospital Stay (HOSPITAL_COMMUNITY)
Admission: EM | Admit: 2022-12-12 | Discharge: 2022-12-16 | DRG: 190 | Disposition: A | Discharge status: Home / Self Care | Payer: Medicare HMO | Attending: Internal Medicine | Admitting: Internal Medicine

## 2022-12-12 ENCOUNTER — Other Ambulatory Visit: Payer: Self-pay

## 2022-12-12 DIAGNOSIS — Z79899 Other long term (current) drug therapy: Secondary | ICD-10-CM

## 2022-12-12 DIAGNOSIS — Z9189 Other specified personal risk factors, not elsewhere classified: Secondary | ICD-10-CM

## 2022-12-12 DIAGNOSIS — E785 Hyperlipidemia, unspecified: Secondary | ICD-10-CM | POA: Diagnosis not present

## 2022-12-12 DIAGNOSIS — J9602 Acute respiratory failure with hypercapnia: Secondary | ICD-10-CM | POA: Diagnosis present

## 2022-12-12 DIAGNOSIS — Z885 Allergy status to narcotic agent status: Secondary | ICD-10-CM

## 2022-12-12 DIAGNOSIS — E669 Obesity, unspecified: Secondary | ICD-10-CM | POA: Diagnosis present

## 2022-12-12 DIAGNOSIS — F17210 Nicotine dependence, cigarettes, uncomplicated: Secondary | ICD-10-CM | POA: Diagnosis not present

## 2022-12-12 DIAGNOSIS — I517 Cardiomegaly: Secondary | ICD-10-CM | POA: Diagnosis not present

## 2022-12-12 DIAGNOSIS — Z880 Allergy status to penicillin: Secondary | ICD-10-CM

## 2022-12-12 DIAGNOSIS — Z8249 Family history of ischemic heart disease and other diseases of the circulatory system: Secondary | ICD-10-CM | POA: Diagnosis not present

## 2022-12-12 DIAGNOSIS — I700 Atherosclerosis of aorta: Secondary | ICD-10-CM | POA: Diagnosis not present

## 2022-12-12 DIAGNOSIS — I10 Essential (primary) hypertension: Secondary | ICD-10-CM | POA: Diagnosis not present

## 2022-12-12 DIAGNOSIS — Z1152 Encounter for screening for COVID-19: Secondary | ICD-10-CM

## 2022-12-12 DIAGNOSIS — Z6833 Body mass index (BMI) 33.0-33.9, adult: Secondary | ICD-10-CM

## 2022-12-12 DIAGNOSIS — R0602 Shortness of breath: Secondary | ICD-10-CM | POA: Diagnosis not present

## 2022-12-12 DIAGNOSIS — J441 Chronic obstructive pulmonary disease with (acute) exacerbation: Principal | ICD-10-CM | POA: Diagnosis present

## 2022-12-12 DIAGNOSIS — B965 Pseudomonas (aeruginosa) (mallei) (pseudomallei) as the cause of diseases classified elsewhere: Secondary | ICD-10-CM | POA: Diagnosis present

## 2022-12-12 DIAGNOSIS — J9601 Acute respiratory failure with hypoxia: Secondary | ICD-10-CM | POA: Diagnosis present

## 2022-12-12 DIAGNOSIS — J439 Emphysema, unspecified: Secondary | ICD-10-CM | POA: Diagnosis not present

## 2022-12-12 LAB — COMPREHENSIVE METABOLIC PANEL
ALT: 16 U/L (ref 0–44)
AST: 19 U/L (ref 15–41)
Albumin: 3.8 g/dL (ref 3.5–5.0)
Alkaline Phosphatase: 97 U/L (ref 38–126)
Anion gap: 10 (ref 5–15)
BUN: 13 mg/dL (ref 8–23)
CO2: 27 mmol/L (ref 22–32)
Calcium: 8.8 mg/dL — ABNORMAL LOW (ref 8.9–10.3)
Chloride: 101 mmol/L (ref 98–111)
Creatinine, Ser: 0.91 mg/dL (ref 0.61–1.24)
GFR, Estimated: >60 mL/min (ref >60)
Glucose, Bld: 111 mg/dL — ABNORMAL HIGH (ref 70–99)
Potassium: 3.4 mmol/L — ABNORMAL LOW (ref 3.5–5.1)
Sodium: 138 mmol/L (ref 135–145)
Total Bilirubin: 0.8 mg/dL (ref <1.2)
Total Protein: 6.8 g/dL (ref 6.5–8.1)

## 2022-12-12 LAB — BLOOD GAS, VENOUS
Acid-Base Excess: 5.7 mmol/L — ABNORMAL HIGH (ref 0.0–2.0)
Bicarbonate: 32.1 mmol/L — ABNORMAL HIGH (ref 20.0–28.0)
O2 Saturation: 87.1 %
Patient temperature: 37
pCO2, Ven: 53 mm[Hg] (ref 44–60)
pH, Ven: 7.39 (ref 7.25–7.43)
pO2, Ven: 53 mm[Hg] — ABNORMAL HIGH (ref 32–45)

## 2022-12-12 LAB — CBC WITH DIFFERENTIAL/PLATELET
Abs Immature Granulocytes: 0.02 10*3/uL (ref 0.00–0.07)
Basophils Absolute: 0 10*3/uL (ref 0.0–0.1)
Basophils Relative: 0 %
Eosinophils Absolute: 0.1 10*3/uL (ref 0.0–0.5)
Eosinophils Relative: 2 %
HCT: 44.2 % (ref 39.0–52.0)
Hemoglobin: 15.2 g/dL (ref 13.0–17.0)
Immature Granulocytes: 0 %
Lymphocytes Relative: 29 %
Lymphs Abs: 1.7 10*3/uL (ref 0.7–4.0)
MCH: 33.2 pg (ref 26.0–34.0)
MCHC: 34.4 g/dL (ref 30.0–36.0)
MCV: 96.5 fL (ref 80.0–100.0)
Monocytes Absolute: 0.8 10*3/uL (ref 0.1–1.0)
Monocytes Relative: 14 %
Neutro Abs: 3.3 10*3/uL (ref 1.7–7.7)
Neutrophils Relative %: 55 %
Platelets: 151 10*3/uL (ref 150–400)
RBC: 4.58 MIL/uL (ref 4.22–5.81)
RDW: 13.7 % (ref 11.5–15.5)
WBC: 6 10*3/uL (ref 4.0–10.5)
nRBC: 0 % (ref 0.0–0.2)

## 2022-12-12 LAB — BRAIN NATRIURETIC PEPTIDE: B Natriuretic Peptide: 34 pg/mL (ref 0.0–100.0)

## 2022-12-12 MED ORDER — IPRATROPIUM-ALBUTEROL 0.5-2.5 (3) MG/3ML IN SOLN
3.0000 mL | Freq: Once | RESPIRATORY_TRACT | Status: AC
  Administered 2022-12-12: 3 mL via RESPIRATORY_TRACT
  Filled 2022-12-12: qty 3

## 2022-12-12 MED ORDER — METHYLPREDNISOLONE SODIUM SUCC 125 MG IJ SOLR
125.0000 mg | Freq: Once | INTRAMUSCULAR | Status: AC
  Administered 2022-12-12: 125 mg via INTRAVENOUS
  Filled 2022-12-12: qty 2

## 2022-12-12 MED ORDER — ALBUTEROL SULFATE HFA 108 (90 BASE) MCG/ACT IN AERS: 2.0000 | INHALATION_SPRAY | RESPIRATORY_TRACT | Status: DC | PRN

## 2022-12-12 NOTE — ED Provider Notes (Addendum)
[place] EMERGENCY DEPARTMENT AT [facility] Provider Note   CSN: [id number] Arrival date & time: 12/12/22  2140     History  Chief Complaint  Patient presents with   Shortness of Breath    [name] is a 67 y.o. male with history of COPD who presents with 1 week of chest tightness with shortness of breath significantly worsened today.  No oxygen at baseline.  Has been using his albuterol nebulizers at home with minimal improvement.  His son is at the bedside who states that there is been a significant decline in his father's respiratory status over the last 24 hours.  States that yesterday he was behaving normally for himself though complaining of shortness of breath but today is significantly more reserved than baseline, increased work of breathing and ill-appearing.  Patient states that his girlfriend has been ill with upper respiratory symptoms.  In addition to cough and shortness of breath he is also endorsing subjective fevers, no documented temperature at home.  History of hypertension as well not currently treated.  HPI     Home Medications Prior to Admission medications   Medication Sig Start Date End Date Taking? Authorizing Provider  albuterol (VENTOLIN HFA) 108 (90 Base) MCG/ACT inhaler Inhale 2 puffs into the lungs every 6 (six) hours as needed for wheezing or shortness of breath. 10/08/22  Yes Martina Sinner, MD  Budeson-Glycopyrrol-Formoterol (BREZTRI AEROSPHERE) 160-9-4.8 MCG/ACT AERO Inhale 2 puffs into the lungs 2 (two) times daily. 06/27/22  Yes Martina Sinner, MD  ipratropium-albuterol (DUONEB) 0.5-2.5 (3) MG/3ML SOLN Take 3 mLs by nebulization every 6 (six) hours as needed (severe wheezing). 05/24/22  Yes [place], Eric J, DO  atorvastatin (LIPITOR) 20 MG tablet Take 1 tablet (20 mg total) by mouth daily. Patient not taking: Reported on 10/10/2022 05/24/22   [place], Alvira Philips, DO  furosemide (LASIX) 40 MG tablet Take 1 tablet (40 mg total)  by mouth daily. Patient not taking: Reported on 10/08/2022 05/24/22   [place], Eric J, DO  Misc. Devices MISC Nebulizer device.  Diagnosis COPD 05/15/22   Hoy Register, MD  tiZANidine (ZANAFLEX) 4 MG tablet Take 1 tablet (4 mg total) by mouth every 8 (eight) hours as needed. Patient not taking: Reported on 12/12/2022 10/10/22   Hoy Register, MD      Allergies    Codeine and Penicillins    Review of Systems   Review of Systems  Constitutional:  Positive for activity change, appetite change, chills, fatigue and fever.  HENT:  Positive for congestion and rhinorrhea.   Respiratory:  Positive for cough, chest tightness and shortness of breath.   Cardiovascular: Negative.   Gastrointestinal: Negative.   Genitourinary: Negative.     Physical Exam Updated Vital Signs BP 137/69   Pulse 84   Temp 97.9 F (36.6 C) (Oral)   Resp 20   Ht 5\' 9"  (1.753 m)   Wt 104.3 kg   SpO2 99%   BMI 33.97 kg/m  Physical Exam Vitals and nursing note reviewed.  Constitutional:      Appearance: He is obese. He is ill-appearing. He is not toxic-appearing.     Comments: Patient appears fatigued, keeps his eyes closed unless prompted to open them verbally.  HENT:     Head: Normocephalic and atraumatic.     Mouth/Throat:     Mouth: Mucous membranes are moist.     Pharynx: No oropharyngeal exudate or posterior oropharyngeal erythema.  Eyes:     General:  Right eye: No discharge.        Left eye: No discharge.     Conjunctiva/sclera: Conjunctivae normal.  Cardiovascular:     Rate and Rhythm: Normal rate and regular rhythm.     Pulses: Normal pulses.     Heart sounds: Normal heart sounds. No murmur heard. Pulmonary:     Effort: Tachypnea and accessory muscle usage present. No respiratory distress or retractions.     Breath sounds: Decreased air movement present. Examination of the left-upper field reveals wheezing. Examination of the right-middle field reveals wheezing. Examination of the  left-middle field reveals wheezing. Examination of the right-lower field reveals wheezing. Examination of the left-lower field reveals wheezing. Wheezing present. No rales.     Comments: On 2 L of 1 oxygen by nasal cannula. Chest:     Chest wall: No mass, tenderness or edema.  Abdominal:     General: Bowel sounds are normal. There is no distension.     Tenderness: There is no abdominal tenderness.  Musculoskeletal:        General: No deformity.     Cervical back: Neck supple.     Right lower leg: No tenderness. 1+ Edema present.     Left lower leg: No tenderness. 1+ Edema present.  Skin:    General: Skin is warm and dry.     Capillary Refill: Capillary refill takes less than 2 seconds.  Neurological:     General: No focal deficit present.     Mental Status: He is alert and oriented to person, place, and time. Mental status is at baseline.  Psychiatric:        Mood and Affect: Mood normal.     ED Results / Procedures / Treatments   Labs (all labs ordered are listed, but only abnormal results are displayed) Labs Reviewed  COMPREHENSIVE METABOLIC PANEL - Abnormal; Notable for the following components:      Result Value   Potassium 3.4 (*)    Glucose, Bld 111 (*)    Calcium 8.8 (*)    All other components within normal limits  BLOOD GAS, VENOUS - Abnormal; Notable for the following components:   pO2, Ven 53 (*)    Bicarbonate 32.1 (*)    Acid-Base Excess 5.7 (*)    All other components within normal limits  RESP PANEL BY RT-PCR (RSV, FLU A&B, COVID)  RVPGX2  CBC WITH DIFFERENTIAL/PLATELET  BRAIN NATRIURETIC PEPTIDE    EKG None  Radiology DG Chest 2 View  Result Date: 12/12/2022 CLINICAL DATA:  Shortness of breath. History of COPD and bronchitis. EXAM: CHEST - 2 VIEW COMPARISON:  CTA chest 05/21/2022. FINDINGS: The heart is slightly enlarged. No vascular congestion is seen. The mediastinum is normally outlined. There is mild aortic atherosclerosis. The lungs are mildly  emphysematous but clear. There is no substantial pleural effusion. 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 IMPRESSION: No evidence of acute chest disease. Mild cardiomegaly. Emphysema. Aortic atherosclerosis. Electronically Signed   By: Almira Bar M.D.   On: 12/12/2022 22:34    Procedures .Critical Care  Performed by: Paris Lore, PA-C Authorized by: Paris Lore, PA-C   Critical care provider statement:    Critical care time (minutes):  45   Critical care was time spent personally by me on the following activities:  Development of treatment plan with patient or surrogate, discussions with consultants, evaluation of patient's response to treatment, examination of patient, obtaining history from patient  or surrogate, ordering and performing treatments and interventions, ordering and review of laboratory studies, ordering and review of radiographic studies, pulse oximetry and re-evaluation of patient's condition     Medications Ordered in ED Medications  albuterol (VENTOLIN HFA) 108 (90 Base) MCG/ACT inhaler 2 puff (has no administration in time range)  albuterol (PROVENTIL,VENTOLIN) solution continuous neb (has no administration in time range)  ipratropium-albuterol (DUONEB) 0.5-2.5 (3) MG/3ML nebulizer solution 3 mL (3 mLs Nebulization Given 12/12/22 2309)  methylPREDNISolone sodium succinate (SOLU-MEDROL) 125 mg/2 mL injection 125 mg (125 mg Intravenous Given 12/12/22 2310)  ipratropium-albuterol (DUONEB) 0.5-2.5 (3) MG/3ML nebulizer solution 3 mL (3 mLs Nebulization Given 12/12/22 2347)    ED Course/ Medical Decision Making/ A&P Clinical Course as of 12/13/22 0323  Fri Dec 13, 2022  0131 Consult Dr. Antionette Char, hospitalist, who is agreeable to admitting this patient to his service. I appreciate his collaboration in the care of this patient.  [RS]    Clinical Course User Index [RS] Sherrilee Gilles                                  Medical Decision Making 67 year old male with history of COPD who presents with chest tightness, shortness of breath. Tachypneic, hypoxic to the 80s with increased work of breathing on arrival.  Mildly tachycardic as well.  Started on 2 L of oxygen nasal cannula.  Cardiac exam with regular rate and rhythm at time my arrival to the bedside.  Pulmonary exam with decreased air movement throughout, wheezing throughout, patient coughing.  1+ lower extremity edema without pitting, symmetric.  Differential diagnosis includes but is limited to COPD, CHF, pneumonia, pneumothorax, PE.  Clinical concern for PE at this time is exceedingly low.  Amount and/or Complexity of Data Reviewed Labs: ordered.    Details:    CBC unremarkable, CMP mild hypokalemia 3.4.  VBG without acidosis, mildly elevated bicarb to 32.  BNP normal, 34, RVP negative.  Radiology: ordered.    Details:  Chest x-ray without acute cardiopulmonary disease, emphysema.  ECG/medicine tests:     Details:  EKG with sinus tachycardia with heart rate of 104, no STEMI.  Risk Prescription drug management. Decision regarding hospitalization.   Clinical picture most consistent with acute COPD exacerbation.  Patient received 2 DuoNebs and Solu-Medrol in the emergency department with some improvement in his wheezing but patient still endorsing chest tightness and with mildly increased work of breathing.  Will initiate continuous albuterol nebulizer.  Given patient's visible fatigue and ill appearance, as well as insight provided by the patient's family that he has significantly declined in the last 24 hours do feel patient will benefit from admission to the hospital at this time for further stabilization of his COPD exacerbation.  Tyrone  voiced understanding of his medical evaluation and treatment plan. Each of their questions answered to their expressed satisfaction.He is amenable to plan for admission at this time.    Consult to hospitalist as above.  This chart was dictated using voice recognition software, Dragon. Despite the best efforts of this provider to proofread and correct errors, errors may still occur which can change documentation meaning.         Final Clinical Impression(s) / ED Diagnoses Final diagnoses:  COPD exacerbation [facility])    Rx / DC Orders ED Discharge Orders     None         Paris Lore, PA-C 12/13/22 1308  Coral Spikes, DO 12/13/22 1555

## 2022-12-12 NOTE — ED Triage Notes (Signed)
Pt reports with shob x 4-5 days. Pt has a hx of COPD. Pt placed on 2L [place] in triage, initial O2 84% room air. Pt also reports sciatica pain and numbness in his right leg.

## 2022-12-12 NOTE — ED Provider Notes (Incomplete)
[place] EMERGENCY DEPARTMENT AT [facility] Provider Note   CSN: [id number] Arrival date & time: 12/12/22  2140     History {Add pertinent medical, surgical, social history, OB history to HPI:1} Chief Complaint  Patient presents with  . Shortness of Breath    [name] is a 67 y.o. male with history of COPD who presents with 1 week of breath with worsening shortness of breath significantly worsened today.  No oxygen at baseline.  Has been using his albuterol nebulizers at home with minimal improvement.  His son is at the bedside who states that there is been a significant decline in his father's respiratory status over the last 24 hours.  States that yesterday he was behaving normally for himself though complaining of shortness of breath but today is significantly more reserved than baseline, increased work of breathing and ill-appearing.  Patient states that his girlfriend has been ill with upper respiratory symptoms.  In addition to cough and shortness of breath he is also endorsing subjective fevers, no documented temperature at home.  History of hypertension as well not currently treated.  HPI     Home Medications Prior to Admission medications   Medication Sig Start Date End Date Taking? Authorizing Provider  albuterol (VENTOLIN HFA) 108 (90 Base) MCG/ACT inhaler Inhale 2 puffs into the lungs every 6 (six) hours as needed for wheezing or shortness of breath. 10/08/22   Martina Sinner, MD  atorvastatin (LIPITOR) 20 MG tablet Take 1 tablet (20 mg total) by mouth daily. Patient not taking: Reported on 10/10/2022 05/24/22   [place], Alvira Philips, DO  Budeson-Glycopyrrol-Formoterol (BREZTRI AEROSPHERE) 160-9-4.8 MCG/ACT AERO Inhale 2 puffs into the lungs 2 (two) times daily. 06/27/22   Martina Sinner, MD  furosemide (LASIX) 40 MG tablet Take 1 tablet (40 mg total) by mouth daily. Patient not taking: Reported on 10/08/2022 05/24/22   [place], Alvira Philips, DO   ipratropium-albuterol (DUONEB) 0.5-2.5 (3) MG/3ML SOLN Take 3 mLs by nebulization every 6 (six) hours as needed (severe wheezing). 05/24/22   [place], Eric J, DO  Misc. Devices MISC Nebulizer device.  Diagnosis COPD 05/15/22   Hoy Register, MD  tiZANidine (ZANAFLEX) 4 MG tablet Take 1 tablet (4 mg total) by mouth every 8 (eight) hours as needed. 10/10/22   Hoy Register, MD      Allergies    Codeine and Penicillins    Review of Systems   Review of Systems  Constitutional:  Positive for activity change, appetite change, chills, fatigue and fever.  HENT:  Positive for congestion and rhinorrhea.   Respiratory:  Positive for cough, chest tightness and shortness of breath.   Cardiovascular: Negative.   Gastrointestinal: Negative.   Genitourinary: Negative.     Physical Exam Updated Vital Signs BP (!) 157/89   Pulse 91   Temp 97.9 F (36.6 C) (Oral)   Resp (!) 23   Ht 5\' 9"  (1.753 m)   Wt 104.3 kg   SpO2 93%   BMI 33.97 kg/m  Physical Exam Vitals and nursing note reviewed.  Constitutional:      Appearance: He is obese. He is ill-appearing. He is not toxic-appearing.     Comments: Patient appears fatigued, keeps his eyes closed unless prompted to open them verbally.  HENT:     Head: Normocephalic and atraumatic.     Mouth/Throat:     Mouth: Mucous membranes are moist.     Pharynx: No oropharyngeal exudate or posterior oropharyngeal erythema.  Eyes:  General:        Right eye: No discharge.        Left eye: No discharge.     Conjunctiva/sclera: Conjunctivae normal.  Cardiovascular:     Rate and Rhythm: Normal rate and regular rhythm.     Pulses: Normal pulses.     Heart sounds: Normal heart sounds. No murmur heard. Pulmonary:     Effort: Tachypnea and accessory muscle usage present. No respiratory distress or retractions.     Breath sounds: Decreased air movement present. Examination of the left-upper field reveals wheezing. Examination of the right-middle field  reveals wheezing. Examination of the left-middle field reveals wheezing. Examination of the right-lower field reveals wheezing. Examination of the left-lower field reveals wheezing. Wheezing present. No rales.     Comments: On 2 L of 1 oxygen by nasal cannula. Chest:     Chest wall: No mass, tenderness or edema.  Abdominal:     General: Bowel sounds are normal. There is no distension.     Tenderness: There is no abdominal tenderness.  Musculoskeletal:        General: No deformity.     Cervical back: Neck supple.     Right lower leg: No tenderness. 1+ Edema present.     Left lower leg: No tenderness. 1+ Edema present.  Skin:    General: Skin is warm and dry.     Capillary Refill: Capillary refill takes less than 2 seconds.  Neurological:     General: No focal deficit present.     Mental Status: He is alert and oriented to person, place, and time. Mental status is at baseline.  Psychiatric:        Mood and Affect: Mood normal.     ED Results / Procedures / Treatments   Labs (all labs ordered are listed, but only abnormal results are displayed) Labs Reviewed  BLOOD GAS, VENOUS - Abnormal; Notable for the following components:      Result Value   pO2, Ven 53 (*)    Bicarbonate 32.1 (*)    Acid-Base Excess 5.7 (*)    All other components within normal limits  CBC WITH DIFFERENTIAL/PLATELET  COMPREHENSIVE METABOLIC PANEL    EKG None  Radiology No results found.  Procedures Procedures  {Document cardiac monitor, telemetry assessment procedure when appropriate:1}  Medications Ordered in ED Medications  albuterol (VENTOLIN HFA) 108 (90 Base) MCG/ACT inhaler 2 puff (has no administration in time range)    ED Course/ Medical Decision Making/ A&P   {   Click here for ABCD2, HEART and other calculatorsREFRESH Note before signing :1}                              Medical Decision Making Amount and/or Complexity of Data Reviewed Labs: ordered. Radiology:  ordered.  Risk Prescription drug management.   ***  {Document critical care time when appropriate:1} {Document review of labs and clinical decision tools ie heart score, Chads2Vasc2 etc:1}  {Document your independent review of radiology images, and any outside records:1} {Document your discussion with family members, caretakers, and with consultants:1} {Document social determinants of health affecting pt's care:1} {Document your decision making why or why not admission, treatments were needed:1} Final Clinical Impression(s) / ED Diagnoses Final diagnoses:  None    Rx / DC Orders ED Discharge Orders     None

## 2022-12-12 NOTE — ED Notes (Signed)
Save Tubes. 2 drk greens, 2 golds, 1 red, 1 blue. If needed.

## 2022-12-13 ENCOUNTER — Encounter (HOSPITAL_COMMUNITY): Payer: Self-pay | Admitting: Family Medicine

## 2022-12-13 DIAGNOSIS — F17210 Nicotine dependence, cigarettes, uncomplicated: Secondary | ICD-10-CM | POA: Diagnosis present

## 2022-12-13 DIAGNOSIS — Z885 Allergy status to narcotic agent status: Secondary | ICD-10-CM | POA: Diagnosis not present

## 2022-12-13 DIAGNOSIS — I10 Essential (primary) hypertension: Secondary | ICD-10-CM | POA: Diagnosis present

## 2022-12-13 DIAGNOSIS — J9601 Acute respiratory failure with hypoxia: Secondary | ICD-10-CM | POA: Diagnosis present

## 2022-12-13 DIAGNOSIS — E785 Hyperlipidemia, unspecified: Secondary | ICD-10-CM | POA: Diagnosis present

## 2022-12-13 DIAGNOSIS — Z8249 Family history of ischemic heart disease and other diseases of the circulatory system: Secondary | ICD-10-CM | POA: Diagnosis not present

## 2022-12-13 DIAGNOSIS — B965 Pseudomonas (aeruginosa) (mallei) (pseudomallei) as the cause of diseases classified elsewhere: Secondary | ICD-10-CM | POA: Diagnosis present

## 2022-12-13 DIAGNOSIS — J441 Chronic obstructive pulmonary disease with (acute) exacerbation: Principal | ICD-10-CM | POA: Diagnosis present

## 2022-12-13 DIAGNOSIS — Z79899 Other long term (current) drug therapy: Secondary | ICD-10-CM | POA: Diagnosis not present

## 2022-12-13 DIAGNOSIS — E669 Obesity, unspecified: Secondary | ICD-10-CM | POA: Diagnosis present

## 2022-12-13 DIAGNOSIS — Z1152 Encounter for screening for COVID-19: Secondary | ICD-10-CM | POA: Diagnosis not present

## 2022-12-13 DIAGNOSIS — Z880 Allergy status to penicillin: Secondary | ICD-10-CM | POA: Diagnosis not present

## 2022-12-13 DIAGNOSIS — Z6833 Body mass index (BMI) 33.0-33.9, adult: Secondary | ICD-10-CM | POA: Diagnosis not present

## 2022-12-13 LAB — CBC
HCT: 47.6 % (ref 39.0–52.0)
Hemoglobin: 15.7 g/dL (ref 13.0–17.0)
MCH: 32.5 pg (ref 26.0–34.0)
MCHC: 33 g/dL (ref 30.0–36.0)
MCV: 98.6 fL (ref 80.0–100.0)
Platelets: 150 10*3/uL (ref 150–400)
RBC: 4.83 MIL/uL (ref 4.22–5.81)
RDW: 13.6 % (ref 11.5–15.5)
WBC: 4 10*3/uL (ref 4.0–10.5)
nRBC: 0 % (ref 0.0–0.2)

## 2022-12-13 LAB — BASIC METABOLIC PANEL
Anion gap: 10 (ref 5–15)
BUN: 12 mg/dL (ref 8–23)
CO2: 28 mmol/L (ref 22–32)
Calcium: 8.6 mg/dL — ABNORMAL LOW (ref 8.9–10.3)
Chloride: 99 mmol/L (ref 98–111)
Creatinine, Ser: 0.81 mg/dL (ref 0.61–1.24)
GFR, Estimated: >60 mL/min (ref >60)
Glucose, Bld: 204 mg/dL — ABNORMAL HIGH (ref 70–99)
Potassium: 4 mmol/L (ref 3.5–5.1)
Sodium: 137 mmol/L (ref 135–145)

## 2022-12-13 LAB — EXPECTORATED SPUTUM ASSESSMENT W GRAM STAIN, RFLX TO RESP C

## 2022-12-13 LAB — RESP PANEL BY RT-PCR (RSV, FLU A&B, COVID)  RVPGX2
Influenza A by PCR: NEGATIVE
Influenza B by PCR: NEGATIVE
Resp Syncytial Virus by PCR: NEGATIVE
SARS Coronavirus 2 by RT PCR: NEGATIVE

## 2022-12-13 LAB — MAGNESIUM: Magnesium: 2.2 mg/dL (ref 1.7–2.4)

## 2022-12-13 MED ORDER — ACETAMINOPHEN 325 MG PO TABS: 650.0000 mg | ORAL_TABLET | Freq: Four times a day (QID) | ORAL | Status: DC | PRN

## 2022-12-13 MED ORDER — SODIUM CHLORIDE 0.9 % IV SOLN
1.0000 g | INTRAVENOUS | Status: DC
  Administered 2022-12-13 – 2022-12-15 (×3): 1 g via INTRAVENOUS
  Filled 2022-12-13 (×3): qty 10

## 2022-12-13 MED ORDER — METHYLPREDNISOLONE SODIUM SUCC 125 MG IJ SOLR
125.0000 mg | Freq: Two times a day (BID) | INTRAMUSCULAR | Status: AC
Start: 2022-12-13 — End: 2022-12-13
  Administered 2022-12-13 (×2): 125 mg via INTRAVENOUS
  Filled 2022-12-13 (×2): qty 2

## 2022-12-13 MED ORDER — ARFORMOTEROL TARTRATE 15 MCG/2ML IN NEBU
15.0000 ug | INHALATION_SOLUTION | Freq: Two times a day (BID) | RESPIRATORY_TRACT | Status: DC
  Administered 2022-12-13 – 2022-12-16 (×7): 15 ug via RESPIRATORY_TRACT
  Filled 2022-12-13 (×7): qty 2

## 2022-12-13 MED ORDER — BUDESONIDE 0.25 MG/2ML IN SUSP
0.2500 mg | Freq: Two times a day (BID) | RESPIRATORY_TRACT | Status: DC
  Administered 2022-12-13 – 2022-12-16 (×7): 0.25 mg via RESPIRATORY_TRACT
  Filled 2022-12-13 (×7): qty 2

## 2022-12-13 MED ORDER — POTASSIUM CHLORIDE CRYS ER 20 MEQ PO TBCR
20.0000 meq | EXTENDED_RELEASE_TABLET | Freq: Once | ORAL | Status: AC
  Administered 2022-12-13: 20 meq via ORAL
  Filled 2022-12-13: qty 1

## 2022-12-13 MED ORDER — ACETAMINOPHEN 650 MG RE SUPP: 650.0000 mg | Freq: Four times a day (QID) | RECTAL | Status: DC | PRN

## 2022-12-13 MED ORDER — HYDRALAZINE HCL 25 MG PO TABS: 25.0000 mg | ORAL_TABLET | Freq: Four times a day (QID) | ORAL | Status: DC | PRN

## 2022-12-13 MED ORDER — GUAIFENESIN 100 MG/5ML PO LIQD
5.0000 mL | ORAL | Status: DC | PRN
  Filled 2022-12-13: qty 10

## 2022-12-13 MED ORDER — ONDANSETRON HCL 4 MG/2ML IJ SOLN: 4.0000 mg | Freq: Four times a day (QID) | INTRAMUSCULAR | Status: DC | PRN

## 2022-12-13 MED ORDER — ONDANSETRON HCL 4 MG PO TABS: 4.0000 mg | ORAL_TABLET | Freq: Four times a day (QID) | ORAL | Status: DC | PRN

## 2022-12-13 MED ORDER — SENNOSIDES-DOCUSATE SODIUM 8.6-50 MG PO TABS: 1.0000 | ORAL_TABLET | Freq: Every evening | ORAL | Status: DC | PRN

## 2022-12-13 MED ORDER — ALBUTEROL SULFATE (2.5 MG/3ML) 0.083% IN NEBU
10.0000 mg/h | INHALATION_SOLUTION | Freq: Once | RESPIRATORY_TRACT | Status: AC
  Administered 2022-12-13: 10 mg/h via RESPIRATORY_TRACT
  Filled 2022-12-13: qty 3

## 2022-12-13 MED ORDER — IPRATROPIUM-ALBUTEROL 0.5-2.5 (3) MG/3ML IN SOLN: 3.0000 mL | Freq: Three times a day (TID) | RESPIRATORY_TRACT | Status: DC

## 2022-12-13 MED ORDER — PREDNISONE 20 MG PO TABS: 40.0000 mg | ORAL_TABLET | Freq: Every day | ORAL | Status: DC

## 2022-12-13 MED ORDER — REVEFENACIN 175 MCG/3ML IN SOLN
175.0000 ug | Freq: Every day | RESPIRATORY_TRACT | Status: DC
  Administered 2022-12-13 – 2022-12-16 (×4): 175 ug via RESPIRATORY_TRACT
  Filled 2022-12-13 (×4): qty 3

## 2022-12-13 MED ORDER — ALBUTEROL SULFATE (2.5 MG/3ML) 0.083% IN NEBU
2.5000 mg | INHALATION_SOLUTION | RESPIRATORY_TRACT | Status: DC | PRN
  Administered 2022-12-14 – 2022-12-15 (×2): 2.5 mg via RESPIRATORY_TRACT
  Filled 2022-12-13 (×2): qty 3

## 2022-12-13 MED ORDER — ENOXAPARIN SODIUM 40 MG/0.4ML IJ SOSY
40.0000 mg | PREFILLED_SYRINGE | INTRAMUSCULAR | Status: DC
  Administered 2022-12-13 – 2022-12-16 (×3): 40 mg via SUBCUTANEOUS
  Filled 2022-12-13 (×3): qty 0.4

## 2022-12-13 MED ORDER — SODIUM CHLORIDE 0.9% FLUSH
3.0000 mL | Freq: Two times a day (BID) | INTRAVENOUS | Status: DC
  Administered 2022-12-13 – 2022-12-16 (×8): 3 mL via INTRAVENOUS

## 2022-12-13 MED ORDER — OXYCODONE HCL 5 MG PO TABS
5.0000 mg | ORAL_TABLET | ORAL | Status: DC | PRN
  Administered 2022-12-15: 5 mg via ORAL
  Filled 2022-12-13: qty 1

## 2022-12-13 NOTE — Progress Notes (Signed)
Patient seen and examined personally, I reviewed the chart, history and physical and admission note, done by admitting physician this morning and agree with the same with following addendum.  Please refer to the morning admission note for more detailed plan of care.  Briefly,  67 yom w/ HTN HLD and COPD presented with shortness of breath w/ cough increasing over the past week which has gotten worse despite nebulizer at home the past 24 hours In [id number] and saturating mid 80s on room air with tachypnea, mild tachycardia, and stable blood pressure. CXR>>negative for acute findings.Labs are notable for negative respiratory virus panel, normal WBC, normal renal function, and normal BNP.  Patient was placed on supplemental oxygen and started on steroid bronchodilators and admitted for COPD exacerbation   On exam Alert awake Lung auscultation: Bilateral diminished with diffuse wheezing on expiration Not in distress no use of accessory muscles  A/p Acute COPD exacerbation Acute hypoxic respiratory failure: Patient with baseline COPD, on Breztri DuoNeb Ventolin HFA and Lasix at home, symptoms slowly progressing, chest x-ray without pneumonia, blood gas shows hypoxia. Cont systemic steroids, start budesonide, Brovana and Yupelri nebulizer, flutter valve 2-4 times per day for mucus clearance, incentive spirometry.  Continue supplemental oxygen.  Hypertension: BP is controlled.  Not on meds, monitor  Active smoking: We discussed extensively about system he is willing to quit.  Declined nicotine patch  Obesity with BMI 33.9: will benefit with weight loss

## 2022-12-13 NOTE — ED Notes (Signed)
Unable to get patients temperature at this time due to him drinking coffee

## 2022-12-13 NOTE — ED Notes (Signed)
ED TO INPATIENT HANDOFF REPORT  Name/Age/Gender [name] 67 y.o. male  Code Status    Code Status Orders  (From admission, onward)           Start     Ordered   12/13/22 0135  Full code  Continuous       Question:  By:  Answer:  Consent: discussion documented in EHR   12/13/22 0135           Code Status History     Date Active Date Inactive Code Status Order ID Comments User Context   05/21/2022 2306 05/24/2022 1553 Full Code [id number]  Angie Fava, DO ED   05/10/2022 0811 05/13/2022 1535 Full Code [id number]  Maryln Gottron, MD ED   06/11/2017 2037 06/13/2017 1622 Full Code [id number]  Nyra Market, MD ED   11/23/2016 2324 11/26/2016 1758 Full Code [id number]  Briscoe Deutscher, MD ED   02/03/2012 1429 02/06/2012 1732 Full Code [id number]  Melvenia Beam, RN ED       Home/SNF/Other Home  Chief Complaint COPD exacerbation (HCC) [J44.1]  Level of Care/Admitting Diagnosis ED Disposition     ED Disposition  Admit   Condition  --   Comment  Hospital Area: [facility] [[id number]]  Level of Care: Telemetry [5]  Admit to tele based on following criteria: Monitor QTC interval  May admit patient to [facility] or [facility] if equivalent level of care is available:: Yes  Covid Evaluation: Confirmed COVID Negative  Diagnosis: COPD exacerbation [facility]) [[id number]]  Admitting Physician: Briscoe Deutscher [[id number]]  Attending Physician: Briscoe Deutscher [[id number]]  Certification:: I certify this patient will need inpatient services for at least 2 midnights  Expected Medical Readiness: 12/15/2022          Medical History Past Medical History:  Diagnosis Date   Asthma    COPD (chronic obstructive pulmonary disease) (HCC)    Hx of adenomatous polyp of colon 01/13/2020   Hypertension     Allergies Allergies  Allergen Reactions   Codeine Itching   Penicillins Itching and Other (See Comments)    IV Location/Drains/Wounds Patient  Lines/Drains/Airways Status     Active Line/Drains/Airways     Name Placement date Placement time Site Days   Peripheral IV 12/12/22 20 G 1" Anterior;Distal;Right;Upper Arm 12/12/22  2235  Arm  1            Labs/Imaging Results for orders placed or performed during the hospital encounter of 12/12/22 (from the past 48 hour(s))  CBC with Differential     Status: None   Collection Time: 12/12/22 10:03 PM  Result Value Ref Range   WBC 6.0 4.0 - 10.5 K/uL   RBC 4.58 4.22 - 5.81 MIL/uL   Hemoglobin 15.2 13.0 - 17.0 g/dL   HCT 63.0 16.0 - 10.9 %   MCV 96.5 80.0 - 100.0 fL   MCH 33.2 26.0 - 34.0 pg   MCHC 34.4 30.0 - 36.0 g/dL   RDW 32.3 55.7 - 32.2 %   Platelets 151 150 - 400 K/uL   nRBC 0.0 0.0 - 0.2 %   Neutrophils Relative % 55 %   Neutro Abs 3.3 1.7 - 7.7 K/uL   Lymphocytes Relative 29 %   Lymphs Abs 1.7 0.7 - 4.0 K/uL   Monocytes Relative 14 %   Monocytes Absolute 0.8 0.1 - 1.0 K/uL   Eosinophils Relative 2 %   Eosinophils Absolute 0.1 0.0 - 0.5 K/uL  Basophils Relative 0 %   Basophils Absolute 0.0 0.0 - 0.1 K/uL   Immature Granulocytes 0 %   Abs Immature Granulocytes 0.02 0.00 - 0.07 K/uL    Comment: Performed at [facility], [address] [place], [place] [address]  Comprehensive metabolic panel     Status: Abnormal   Collection Time: 12/12/22 10:03 PM  Result Value Ref Range   Sodium 138 135 - 145 mmol/L   Potassium 3.4 (L) 3.5 - 5.1 mmol/L   Chloride 101 98 - 111 mmol/L   CO2 27 22 - 32 mmol/L   Glucose, Bld 111 (H) 70 - 99 mg/dL    Comment: Glucose reference range applies only to samples taken after fasting for at least 8 hours.   BUN 13 8 - 23 mg/dL   Creatinine, Ser 6.04 0.61 - 1.24 mg/dL   Calcium 8.8 (L) 8.9 - 10.3 mg/dL   Total Protein 6.8 6.5 - 8.1 g/dL   Albumin 3.8 3.5 - 5.0 g/dL   AST 19 15 - 41 U/L   ALT 16 0 - 44 U/L   Alkaline Phosphatase 97 38 - 126 U/L   Total Bilirubin 0.8 <1.2 mg/dL   GFR, Estimated >54 >09 mL/min     Comment: (NOTE) Calculated using the CKD-EPI Creatinine Equation (2021)    Anion gap 10 5 - 15    Comment: Performed at [facility], [address] [place], [place] [address]  Blood gas, venous (at [facility] and [facility])     Status: Abnormal   Collection Time: 12/12/22 10:03 PM  Result Value Ref Range   pH, Ven 7.39 7.25 - 7.43   pCO2, Ven 53 44 - 60 mmHg   pO2, Ven 53 (H) 32 - 45 mmHg   Bicarbonate 32.1 (H) 20.0 - 28.0 mmol/L   Acid-Base Excess 5.7 (H) 0.0 - 2.0 mmol/L   O2 Saturation 87.1 %   Patient temperature 37.0     Comment: Performed at [facility], [address] [place], [place] [address]  Brain natriuretic peptide     Status: None   Collection Time: 12/12/22 10:03 PM  Result Value Ref Range   B Natriuretic Peptide 34.0 0.0 - 100.0 pg/mL    Comment: Performed at [facility], [address] [place], [place] [address]  Resp panel by RT-PCR (RSV, Flu A&B, Covid) Anterior Nasal Swab     Status: None   Collection Time: 12/12/22 11:25 PM   Specimen: Anterior Nasal Swab  Result Value Ref Range   SARS Coronavirus 2 by RT PCR NEGATIVE NEGATIVE    Comment: (NOTE) SARS-CoV-2 target nucleic acids are NOT DETECTED.  The SARS-CoV-2 RNA is generally detectable in upper respiratory specimens during the acute phase of infection. The lowest concentration of SARS-CoV-2 viral copies this assay can detect is 138 copies/mL. A negative result does not preclude SARS-Cov-2 infection and should not be used as the sole basis for treatment or other patient management decisions. A negative result may occur with  improper specimen collection/handling, submission of specimen other than nasopharyngeal swab, presence of viral mutation(s) within the areas targeted by this assay, and inadequate number of viral copies(<138 copies/mL). A negative result must be combined with clinical observations, patient history, and epidemiological information. The  expected result is Negative.  Fact Sheet for Patients:  BloggerCourse.com  Fact Sheet for Healthcare Providers:  SeriousBroker.it  This test is no t yet approved or cleared by the [place] and  has been authorized for detection and/or diagnosis of SARS-CoV-2 by FDA under an Emergency Use Authorization (EUA). This EUA will remain  in effect (meaning this test can be used) for the duration of the COVID-19 declaration under Section 564(b)(1) of the Act, 21 U.S.C.section 360bbb-3(b)(1), unless the authorization is terminated  or revoked sooner.       Influenza A by PCR NEGATIVE NEGATIVE   Influenza B by PCR NEGATIVE NEGATIVE    Comment: (NOTE) The Xpert Xpress SARS-CoV-2/FLU/RSV plus assay is intended as an aid in the diagnosis of influenza from Nasopharyngeal swab specimens and should not be used as a sole basis for treatment. Nasal washings and aspirates are unacceptable for Xpert Xpress SARS-CoV-2/FLU/RSV testing.  Fact Sheet for Patients: BloggerCourse.com  Fact Sheet for Healthcare Providers: SeriousBroker.it  This test is not yet approved or cleared by the [place] FDA and has been authorized for detection and/or diagnosis of SARS-CoV-2 by FDA under an Emergency Use Authorization (EUA). This EUA will remain in effect (meaning this test can be used) for the duration of the COVID-19 declaration under Section 564(b)(1) of the Act, 21 U.S.C. section 360bbb-3(b)(1), unless the authorization is terminated or revoked.     Resp Syncytial Virus by PCR NEGATIVE NEGATIVE    Comment: (NOTE) Fact Sheet for Patients: BloggerCourse.com  Fact Sheet for Healthcare Providers: SeriousBroker.it  This test is not yet approved or cleared by the [place] FDA and has been authorized for detection and/or diagnosis of  SARS-CoV-2 by FDA under an Emergency Use Authorization (EUA). This EUA will remain in effect (meaning this test can be used) for the duration of the COVID-19 declaration under Section 564(b)(1) of the Act, 21 U.S.C. section 360bbb-3(b)(1), unless the authorization is terminated or revoked.  Performed at [facility], [address], [place], [place] [address]   Expectorated Sputum Assessment w Gram Stain, Rflx to Resp Cult     Status: None   Collection Time: 12/13/22  5:22 AM   Specimen: Expectorated Sputum  Result Value Ref Range   Specimen Description EXPECTORATED SPUTUM    Special Requests NONE    Sputum evaluation      THIS SPECIMEN IS ACCEPTABLE FOR SPUTUM CULTURE Performed at [facility], [address] [place], [place] [address]    Report Status 12/13/2022 FINAL   Basic metabolic panel     Status: Abnormal   Collection Time: 12/13/22  5:45 AM  Result Value Ref Range   Sodium 137 135 - 145 mmol/L   Potassium 4.0 3.5 - 5.1 mmol/L   Chloride 99 98 - 111 mmol/L   CO2 28 22 - 32 mmol/L   Glucose, Bld 204 (H) 70 - 99 mg/dL    Comment: Glucose reference range applies only to samples taken after fasting for at least 8 hours.   BUN 12 8 - 23 mg/dL   Creatinine, Ser 3.47 0.61 - 1.24 mg/dL   Calcium 8.6 (L) 8.9 - 10.3 mg/dL   GFR, Estimated >42 >59 mL/min    Comment: (NOTE) Calculated using the CKD-EPI Creatinine Equation (2021)    Anion gap 10 5 - 15    Comment: Performed at [facility], [address], [place], [place] [address]  Magnesium     Status: None   Collection Time: 12/13/22  5:45 AM  Result Value Ref Range   Magnesium 2.2 1.7 - 2.4 mg/dL    Comment: Performed at [facility], [address] [place], [place]  [address]  CBC     Status: None   Collection Time: 12/13/22  5:45 AM  Result Value Ref Range   WBC 4.0 4.0 - 10.5 K/uL   RBC 4.83 4.22 - 5.81 MIL/uL   Hemoglobin 15.7 13.0 -  17.0 g/dL   HCT 36.6 44.0 - 34.7 %   MCV 98.6 80.0 - 100.0 fL   MCH 32.5 26.0 - 34.0 pg   MCHC 33.0 30.0 - 36.0 g/dL   RDW 42.5 95.6 - 38.7 %   Platelets 150 150 - 400 K/uL   nRBC 0.0 0.0 - 0.2 %    Comment: Performed at [facility], [address], [place], [place] [address]   DG Chest 2 View  Result Date: 12/12/2022 CLINICAL DATA:  Shortness of breath. History of COPD and bronchitis. EXAM: CHEST - 2 VIEW COMPARISON:  CTA chest 05/21/2022. FINDINGS: The heart is slightly enlarged. No vascular congestion is seen. The mediastinum is normally outlined. There is mild aortic atherosclerosis. The lungs are mildly emphysematous but clear. There is no substantial pleural effusion. 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 IMPRESSION: No evidence of acute chest disease. Mild cardiomegaly. Emphysema. Aortic atherosclerosis. Electronically Signed   By: Almira Bar M.D.   On: 12/12/2022 22:34    Pending Labs Unresulted Labs (From admission, onward)     Start     Ordered   12/20/22 0500  Creatinine, serum  (enoxaparin (LOVENOX)    CrCl >/= 30 ml/min)  Weekly,   R     Comments: while on enoxaparin therapy    12/13/22 0135   12/13/22 0522  Culture, Respiratory w Gram Stain  Once,   R        12/13/22 0522   12/13/22 0500  Basic metabolic panel  Daily,   R      12/13/22 0135   12/13/22 0500  CBC  Daily,   R      12/13/22 0135            Vitals/Pain Today's Vitals   12/13/22 0800 12/13/22 0829 12/13/22 1000 12/13/22 1008  BP: 121/78  (!) 141/84   Pulse: 75  88   Resp: 17  18   Temp:    (!) 97.1 F (36.2 C)  TempSrc:    Axillary  SpO2: 99%  96%   Weight:      Height:      PainSc:  0-No pain      Isolation Precautions No active isolations  Medications Medications  enoxaparin (LOVENOX) injection 40 mg (40 mg Subcutaneous Given 12/13/22 1002)  sodium chloride flush (NS) 0.9 % injection 3 mL (3 mLs  Intravenous Given 12/13/22 1003)  acetaminophen (TYLENOL) tablet 650 mg (has no administration in time range)    Or  acetaminophen (TYLENOL) suppository 650 mg (has no administration in time range)  oxyCODONE (Oxy IR/ROXICODONE) immediate release tablet 5 mg (has no administration in time range)  senna-docusate (Senokot-S) tablet 1 tablet (has no administration in time range)  ondansetron (ZOFRAN) tablet 4 mg (has no administration in time range)    Or  ondansetron (ZOFRAN) injection 4 mg (has no administration in time range)  cefTRIAXone (ROCEPHIN) 1 g in sodium chloride 0.9 % 100 mL IVPB (0 g Intravenous Stopped 12/13/22 0230)  methylPREDNISolone sodium succinate (SOLU-MEDROL) 125 mg/2 mL injection 125 mg (125 mg Intravenous Given 12/13/22 1113)    Followed by  predniSONE (DELTASONE) tablet 40 mg (has no administration  in time range)  albuterol (PROVENTIL) (2.5 MG/3ML) 0.083% nebulizer solution 2.5 mg (has no administration in time range)  guaiFENesin (ROBITUSSIN) 100 MG/5ML liquid 5 mL (has no administration in time range)  hydrALAZINE (APRESOLINE) tablet 25 mg (has no administration in time range)  budesonide (PULMICORT) nebulizer solution 0.25 mg (0.25 mg Nebulization Given 12/13/22 0828)  revefenacin (YUPELRI) nebulizer solution 175 mcg (175 mcg Nebulization Given 12/13/22 0827)  arformoterol (BROVANA) nebulizer solution 15 mcg (15 mcg Nebulization Given 12/13/22 0827)  ipratropium-albuterol (DUONEB) 0.5-2.5 (3) MG/3ML nebulizer solution 3 mL (3 mLs Nebulization Given 12/12/22 2309)  methylPREDNISolone sodium succinate (SOLU-MEDROL) 125 mg/2 mL injection 125 mg (125 mg Intravenous Given 12/12/22 2310)  ipratropium-albuterol (DUONEB) 0.5-2.5 (3) MG/3ML nebulizer solution 3 mL (3 mLs Nebulization Given 12/12/22 2347)  albuterol (PROVENTIL) (2.5 MG/3ML) 0.083% nebulizer solution (10 mg/hr Nebulization Given 12/13/22 0117)  potassium chloride SA (KLOR-CON M) CR tablet 20 mEq (20 mEq Oral  Given 12/13/22 0208)    Mobility walks

## 2022-12-13 NOTE — Hospital Course (Addendum)
67 yom w/ HTN HLD and COPD presented with shortness of breath w/ cough increasing over the past week which has gotten worse despite nebulizer at home the past 24 hours In OZ:[id number] and saturating mid 80s on room air with tachypnea, mild tachycardia, and stable blood pressure. CXR>>negative for acute findings.Labs are notable for negative respiratory virus panel, normal WBC, normal renal function, and normal BNP.  Patient was placed on supplemental oxygen and started on steroid bronchodilators and admitted for COPD exacerbation Patient is slow to improve.  Sputum culture showed Pseudomonas pansensitive.  Gram-positive cocci and gram negative rods and gram-positive rods-antibiotic adjusted to cefepime while inpatient. He has weaned off to room air.  At this time respiratory status stable doing much better.  He will be discharged home

## 2022-12-13 NOTE — H&P (Signed)
History and Physical    [name] [id number]:[id number] DOB: 1955/02/28 DOA: 12/12/2022  PCP: Hoy Register, MD   Patient coming from: Home   Chief Complaint: SOB  HPI: [name] is a 67 y.o. male with medical history significant for hypertension, hyperlipidemia, and COPD who presents with shortness of breath.   Patient reports increased shortness of breath and cough over the past week.  He was able to manage his symptoms with breathing treatments at home but worsened significantly over the past 24 hours and is now short of breath at rest.  He denies chest pain.  ED Course: Upon arrival to the ED, patient is found to be afebrile and saturating mid 80s on room air with tachypnea, mild tachycardia, and stable blood pressure.  Chest x-ray is negative for acute findings.  Labs are notable for negative respiratory virus panel, normal WBC, normal renal function, and normal BNP.   He was placed on supplemental oxygen and treated with IV Solu-Medrol, DuoNebs x 2, and continuous albuterol treatment in the ED.  Review of Systems:  All other systems reviewed and apart from HPI, are negative.  Past Medical History:  Diagnosis Date   Asthma    COPD (chronic obstructive pulmonary disease) (HCC)    Hx of adenomatous polyp of colon 01/13/2020   Hypertension     Past Surgical History:  Procedure Laterality Date   HAND SURGERY Bilateral left 0272,5366 Rt    Social History:   reports that he has been smoking cigarettes. He has a 45 pack-year smoking history. He has never used smokeless tobacco. He reports current alcohol use of about 1.0 standard drink of alcohol per week. He reports current drug use. Frequency: 1.00 time per week. Drug: Marijuana.  Allergies  Allergen Reactions   Codeine Itching   Penicillins Itching and Other (See Comments)    Family History  Problem Relation Age of Onset   Hypertension Paternal Uncle    Cancer Maternal Grandmother    Hypertension  Maternal Grandfather    Colon cancer Cousin    Colon polyps Neg Hx    Esophageal cancer Neg Hx    Stomach cancer Neg Hx    Rectal cancer Neg Hx      Prior to Admission medications   Medication Sig Start Date End Date Taking? Authorizing Provider  albuterol (VENTOLIN HFA) 108 (90 Base) MCG/ACT inhaler Inhale 2 puffs into the lungs every 6 (six) hours as needed for wheezing or shortness of breath. 10/08/22  Yes Martina Sinner, MD  Budeson-Glycopyrrol-Formoterol (BREZTRI AEROSPHERE) 160-9-4.8 MCG/ACT AERO Inhale 2 puffs into the lungs 2 (two) times daily. 06/27/22  Yes Martina Sinner, MD  ipratropium-albuterol (DUONEB) 0.5-2.5 (3) MG/3ML SOLN Take 3 mLs by nebulization every 6 (six) hours as needed (severe wheezing). 05/24/22  Yes [place], Eric J, DO  atorvastatin (LIPITOR) 20 MG tablet Take 1 tablet (20 mg total) by mouth daily. Patient not taking: Reported on 10/10/2022 05/24/22   [place], Alvira Philips, DO  furosemide (LASIX) 40 MG tablet Take 1 tablet (40 mg total) by mouth daily. Patient not taking: Reported on 10/08/2022 05/24/22   [place], Eric J, DO  Misc. Devices MISC Nebulizer device.  Diagnosis COPD 05/15/22   Hoy Register, MD  tiZANidine (ZANAFLEX) 4 MG tablet Take 1 tablet (4 mg total) by mouth every 8 (eight) hours as needed. Patient not taking: Reported on 12/12/2022 10/10/22   Hoy Register, MD    Physical Exam: Vitals:   12/12/22 2233 12/13/22 0045 12/13/22  0130 12/13/22 0131  BP:  137/69    Pulse: 95 84 81   Resp: (!) 21 20 17    Temp:    97.9 F (36.6 C)  TempSrc:    Oral  SpO2: 95% 99% 100%   Weight:      Height:        Constitutional: NAD, no pallor or diahoresis   Eyes: PERTLA, lids and conjunctivae normal ENMT: Mucous membranes are moist. Posterior pharynx clear of any exudate or lesions.   Neck: supple, no masses  Respiratory: Diminished bilaterally with prolonged expiratory phase. Accessory muscle recruitment.  Cardiovascular: S1 & S2 heard, regular rate and  rhythm. No JVD.  Abdomen: No distension, no tenderness, soft. Bowel sounds active.  Musculoskeletal: no clubbing / cyanosis. No joint deformity upper and lower extremities.   Skin: no significant rashes, lesions, ulcers. Warm, dry, well-perfused. Neurologic: CN 2-12 grossly intact. Moving all extremities. Sleeping, wakes to voice and is oriented.  Psychiatric: Calm. Cooperative.    Labs and Imaging on Admission: I have personally reviewed following labs and imaging studies  CBC: Recent Labs  Lab 12/12/22 2203  WBC 6.0  NEUTROABS 3.3  HGB 15.2  HCT 44.2  MCV 96.5  PLT 151   Basic Metabolic Panel: Recent Labs  Lab 12/12/22 2203  NA 138  K 3.4*  CL 101  CO2 27  GLUCOSE 111*  BUN 13  CREATININE 0.91  CALCIUM 8.8*   GFR: Estimated Creatinine Clearance: 95 mL/min (by C-G formula based on SCr of 0.91 mg/dL). Liver Function Tests: Recent Labs  Lab 12/12/22 2203  AST 19  ALT 16  ALKPHOS 97  BILITOT 0.8  PROT 6.8  ALBUMIN 3.8   No results for input(s): "LIPASE", "AMYLASE" in the last 168 hours. No results for input(s): "AMMONIA" in the last 168 hours. Coagulation Profile: No results for input(s): "INR", "PROTIME" in the last 168 hours. Cardiac Enzymes: No results for input(s): "CKTOTAL", "CKMB", "CKMBINDEX", "TROPONINI" in the last 168 hours. BNP (last 3 results) No results for input(s): "PROBNP" in the last 8760 hours. HbA1C: No results for input(s): "HGBA1C" in the last 72 hours. CBG: No results for input(s): "GLUCAP" in the last 168 hours. Lipid Profile: No results for input(s): "CHOL", "HDL", "LDLCALC", "TRIG", "CHOLHDL", "LDLDIRECT" in the last 72 hours. Thyroid Function Tests: No results for input(s): "TSH", "T4TOTAL", "FREET4", "T3FREE", "THYROIDAB" in the last 72 hours. Anemia Panel: No results for input(s): "VITAMINB12", "FOLATE", "FERRITIN", "TIBC", "IRON", "RETICCTPCT" in the last 72 hours. Urine analysis:    Component Value Date/Time   COLORURINE  YELLOW 06/11/2017 1927   APPEARANCEUR CLEAR 06/11/2017 1927   LABSPEC 1.014 06/11/2017 1927   PHURINE 5.0 06/11/2017 1927   GLUCOSEU NEGATIVE 06/11/2017 1927   HGBUR MODERATE (A) 06/11/2017 1927   BILIRUBINUR NEGATIVE 06/11/2017 1927   KETONESUR NEGATIVE 06/11/2017 1927   PROTEINUR NEGATIVE 06/11/2017 1927   UROBILINOGEN 1.0 05/28/2014 1848   NITRITE NEGATIVE 06/11/2017 1927   LEUKOCYTESUR NEGATIVE 06/11/2017 1927   Sepsis Labs: @LABRCNTIP (procalcitonin:4,lacticidven:4) ) Recent Results (from the past 240 hour(s))  Resp panel by RT-PCR (RSV, Flu A&B, Covid) Anterior Nasal Swab     Status: None   Collection Time: 12/12/22 11:25 PM   Specimen: Anterior Nasal Swab  Result Value Ref Range Status   SARS Coronavirus 2 by RT PCR NEGATIVE NEGATIVE Final    Comment: (NOTE) SARS-CoV-2 target nucleic acids are NOT DETECTED.  The SARS-CoV-2 RNA is generally detectable in upper respiratory specimens during the acute phase of infection.  The lowest concentration of SARS-CoV-2 viral copies this assay can detect is 138 copies/mL. A negative result does not preclude SARS-Cov-2 infection and should not be used as the sole basis for treatment or other patient management decisions. A negative result may occur with  improper specimen collection/handling, submission of specimen other than nasopharyngeal swab, presence of viral mutation(s) within the areas targeted by this assay, and inadequate number of viral copies(<138 copies/mL). A negative result must be combined with clinical observations, patient history, and epidemiological information. The expected result is Negative.  Fact Sheet for Patients:  BloggerCourse.com  Fact Sheet for Healthcare Providers:  SeriousBroker.it  This test is no t yet approved or cleared by the [place] FDA and  has been authorized for detection and/or diagnosis of SARS-CoV-2 by FDA under an Emergency Use  Authorization (EUA). This EUA will remain  in effect (meaning this test can be used) for the duration of the COVID-19 declaration under Section 564(b)(1) of the Act, 21 U.S.C.section 360bbb-3(b)(1), unless the authorization is terminated  or revoked sooner.       Influenza A by PCR NEGATIVE NEGATIVE Final   Influenza B by PCR NEGATIVE NEGATIVE Final    Comment: (NOTE) The Xpert Xpress SARS-CoV-2/FLU/RSV plus assay is intended as an aid in the diagnosis of influenza from Nasopharyngeal swab specimens and should not be used as a sole basis for treatment. Nasal washings and aspirates are unacceptable for Xpert Xpress SARS-CoV-2/FLU/RSV testing.  Fact Sheet for Patients: BloggerCourse.com  Fact Sheet for Healthcare Providers: SeriousBroker.it  This test is not yet approved or cleared by the [place] FDA and has been authorized for detection and/or diagnosis of SARS-CoV-2 by FDA under an Emergency Use Authorization (EUA). This EUA will remain in effect (meaning this test can be used) for the duration of the COVID-19 declaration under Section 564(b)(1) of the Act, 21 U.S.C. section 360bbb-3(b)(1), unless the authorization is terminated or revoked.     Resp Syncytial Virus by PCR NEGATIVE NEGATIVE Final    Comment: (NOTE) Fact Sheet for Patients: BloggerCourse.com  Fact Sheet for Healthcare Providers: SeriousBroker.it  This test is not yet approved or cleared by the [place] FDA and has been authorized for detection and/or diagnosis of SARS-CoV-2 by FDA under an Emergency Use Authorization (EUA). This EUA will remain in effect (meaning this test can be used) for the duration of the COVID-19 declaration under Section 564(b)(1) of the Act, 21 U.S.C. section 360bbb-3(b)(1), unless the authorization is terminated or revoked.  Performed at [facility],  [address], [place], [place] [address]      Radiological Exams on Admission: DG Chest 2 View  Result Date: 12/12/2022 CLINICAL DATA:  Shortness of breath. History of COPD and bronchitis. EXAM: CHEST - 2 VIEW COMPARISON:  CTA chest 05/21/2022. FINDINGS: The heart is slightly enlarged. No vascular congestion is seen. The mediastinum is normally outlined. There is mild aortic atherosclerosis. The lungs are mildly emphysematous but clear. There is no substantial pleural effusion. 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 IMPRESSION: No evidence of acute chest disease. Mild cardiomegaly. Emphysema. Aortic atherosclerosis. Electronically Signed   By: Almira Bar M.D.   On: 12/12/2022 22:34    EKG: Independently reviewed. Sinus tachycardia, rate 104.   Assessment/Plan   1. COPD exacerbation; acute hypoxic respiratory failure  - Culture sputum, continue systemic steroids, start antibiotic, schedule DuoNebs, use additional albuterol as needed, continue supplemental O2 as needed  2. Hypertension  - No longer taking any antihypertensives at home  - Treat as-needed only for now    DVT prophylaxis: Lovenox  Code Status: Full  Level of Care: Level of care: Telemetry Family Communication: Son updated from ED  Disposition Plan:  Patient is from: home  Anticipated d/c is to: Home  Anticipated d/c date is: 12/16/22  Patient currently: Pending improved respiratory status  Consults called: None  Admission status: Inpatient     Briscoe Deutscher, MD [facility]  12/13/2022, 1:36 AM

## 2022-12-13 NOTE — Plan of Care (Signed)
  Problem: Education: Goal: Knowledge of General Education information will improve Description: Including pain rating scale, medication(s)/side effects and non-pharmacologic comfort measures Outcome: Progressing   Problem: Health Behavior/Discharge Planning: Goal: Ability to manage health-related needs will improve Outcome: Progressing   Problem: Activity: Goal: Risk for activity intolerance will decrease Outcome: Progressing   Problem: Nutrition: Goal: Adequate nutrition will be maintained Outcome: Progressing   Problem: Coping: Goal: Level of anxiety will decrease Outcome: Progressing   Problem: Elimination: Goal: Will not experience complications related to bowel motility Outcome: Progressing   Problem: Safety: Goal: Ability to remain free from injury will improve Outcome: Progressing   Problem: Education: Goal: Knowledge of disease or condition will improve Outcome: Progressing   Problem: Respiratory: Goal: Ability to maintain a clear airway will improve Outcome: Progressing Goal: Levels of oxygenation will improve Outcome: Progressing

## 2022-12-14 DIAGNOSIS — J441 Chronic obstructive pulmonary disease with (acute) exacerbation: Secondary | ICD-10-CM | POA: Diagnosis not present

## 2022-12-14 LAB — BASIC METABOLIC PANEL
Anion gap: 6 (ref 5–15)
BUN: 16 mg/dL (ref 8–23)
CO2: 33 mmol/L — ABNORMAL HIGH (ref 22–32)
Calcium: 8.8 mg/dL — ABNORMAL LOW (ref 8.9–10.3)
Chloride: 101 mmol/L (ref 98–111)
Creatinine, Ser: 0.86 mg/dL (ref 0.61–1.24)
GFR, Estimated: >60 mL/min (ref >60)
Glucose, Bld: 145 mg/dL — ABNORMAL HIGH (ref 70–99)
Potassium: 4.8 mmol/L (ref 3.5–5.1)
Sodium: 140 mmol/L (ref 135–145)

## 2022-12-14 LAB — CBC
HCT: 44.8 % (ref 39.0–52.0)
Hemoglobin: 14.4 g/dL (ref 13.0–17.0)
MCH: 32.4 pg (ref 26.0–34.0)
MCHC: 32.1 g/dL (ref 30.0–36.0)
MCV: 100.9 fL — ABNORMAL HIGH (ref 80.0–100.0)
Platelets: 159 10*3/uL (ref 150–400)
RBC: 4.44 MIL/uL (ref 4.22–5.81)
RDW: 13.4 % (ref 11.5–15.5)
WBC: 10.2 10*3/uL (ref 4.0–10.5)
nRBC: 0 % (ref 0.0–0.2)

## 2022-12-14 MED ORDER — GUAIFENESIN ER 600 MG PO TB12
600.0000 mg | ORAL_TABLET | Freq: Two times a day (BID) | ORAL | Status: DC
  Administered 2022-12-14 – 2022-12-16 (×5): 600 mg via ORAL
  Filled 2022-12-14 (×5): qty 1

## 2022-12-14 MED ORDER — METHYLPREDNISOLONE SODIUM SUCC 125 MG IJ SOLR
80.0000 mg | Freq: Two times a day (BID) | INTRAMUSCULAR | Status: DC
  Administered 2022-12-14 – 2022-12-16 (×5): 80 mg via INTRAVENOUS
  Filled 2022-12-14 (×5): qty 2

## 2022-12-14 NOTE — Plan of Care (Signed)
  Problem: Education: Goal: Knowledge of General Education information will improve Description: Including pain rating scale, medication(s)/side effects and non-pharmacologic comfort measures Outcome: Progressing   Problem: Clinical Measurements: Goal: Ability to maintain clinical measurements within normal limits will improve Outcome: Progressing Goal: Respiratory complications will improve Outcome: Progressing   Problem: Activity: Goal: Risk for activity intolerance will decrease Outcome: Progressing   Problem: Nutrition: Goal: Adequate nutrition will be maintained Outcome: Progressing   Problem: Pain Management: Goal: General experience of comfort will improve Outcome: Progressing   Problem: Safety: Goal: Ability to remain free from injury will improve Outcome: Progressing

## 2022-12-14 NOTE — Progress Notes (Signed)
PROGRESS NOTE [name]  ZOX:[id number] DOB: 10-27-1955 DOA: 12/12/2022 PCP: Hoy Register, MD  Brief Narrative/Hospital Course: 49 yom w/ HTN HLD and COPD presented with shortness of breath w/ cough increasing over the past week which has gotten worse despite nebulizer at home the past 24 hours In [place]:XBJYNWGN and saturating mid 80s on room air with tachypnea, mild tachycardia, and stable blood pressure. CXR>>negative for acute findings.Labs are notable for negative respiratory virus panel, normal WBC, normal renal function, and normal BNP.  Patient was placed on supplemental oxygen and started on steroid bronchodilators and admitted for COPD exacerbation    Subjective: Seen and examined Still complains of ongoing pain shortness of breath and chest tightness, needing oxygen cannula at 2 L   Assessment and Plan: Active Problems:   Acute respiratory failure with hypoxia (HCC)   Essential hypertension   Hyperlipidemia   COPD exacerbation (HCC) Acute COPD exacerbation Acute hypoxic respiratory failure: Patient with baseline COPD, on Breztri DuoNeb Ventolin HFA and Lasix at home, symptoms slowly progressing, chest x-ray without pneumonia, blood gas shows hypoxia.  Having ongoing wheezing cough will switch back to IV steroid, add antitussives, continue budesonide, Brovana and Yupelri nebulizer, supplemental oxygen   Hypertension: BP is controlled. Not on meds, monitor  Active smoking: We discussed extensively and he is motivated to quit.Declined nicotine patch  Obesity:Patient's Body mass index is 33.97 kg/m. : Will benefit with PCP follow-up, weight loss  healthy lifestyle and outpatient sleep evaluation.  DVT prophylaxis: enoxaparin (LOVENOX) injection 40 mg Start: 12/13/22 1000 Code Status:   Code Status: Full Code Family Communication: plan of care discussed with patient at bedside. Patient status is: Inpatient because of COPD exacerbation Level of care: Telemetry    Dispo: The patient is from: Home            Anticipated disposition: Home next 1 to 2 days Objective: Vitals last 24 hrs: Vitals:   12/14/22 0136 12/14/22 0454 12/14/22 0757 12/14/22 0803  BP: 129/78 (!) 146/93    Pulse:  71    Resp: 20 18    Temp: 98 F (36.7 C) 98.6 F (37 C)    TempSrc:      SpO2: 92% 100% 97% 97%  Weight:      Height:       Weight change:   Physical Examination: General exam: alert awake, older than stated age HEENT:Oral mucosa moist, Ear/Nose WNL grossly Respiratory system: bilaterally diminished and bilateral wheezing, no use of accessory muscle Cardiovascular system: S1 & S2 +, No JVD. Gastrointestinal system: Abdomen soft,NT,ND, BS+ Nervous System:Alert, awake, moving extremities. Extremities: LE edema neg,distal peripheral pulses palpable.  Skin: No rashes,no icterus. MSK: Normal muscle bulk,tone, power  Medications reviewed:  Scheduled Meds:  arformoterol  15 mcg Nebulization BID   budesonide (PULMICORT) nebulizer solution  0.25 mg Nebulization BID   enoxaparin (LOVENOX) injection  40 mg Subcutaneous Q24H   guaiFENesin  600 mg Oral BID   methylPREDNISolone (SOLU-MEDROL) injection  80 mg Intravenous Q12H   revefenacin  175 mcg Nebulization Daily   sodium chloride flush  3 mL Intravenous Q12H   Continuous Infusions:  cefTRIAXone (ROCEPHIN)  IV 1 g (12/14/22 0153)    Diet Order             Diet regular Room service appropriate? Yes; Fluid consistency: Thin  Diet effective now                   No intake or output data in the  24 hours ending 12/14/22 1151 Net IO Since Admission: 103 mL [12/14/22 1151]  Wt Readings from Last 3 Encounters:  12/12/22 104.3 kg  12/03/22 101.6 kg  10/10/22 105.2 kg     Unresulted Labs (From admission, onward)     Start     Ordered   12/20/22 0500  Creatinine, serum  (enoxaparin (LOVENOX)    CrCl >/= 30 ml/min)  Weekly,   R     Comments: while on enoxaparin therapy    12/13/22 0135           Data Reviewed: I have personally reviewed following labs and imaging studies CBC: Recent Labs  Lab 12/12/22 2203 12/13/22 0545 12/14/22 0549  WBC 6.0 4.0 10.2  NEUTROABS 3.3  --   --   HGB 15.2 15.7 14.4  HCT 44.2 47.6 44.8  MCV 96.5 98.6 100.9*  PLT 151 150 159   Basic Metabolic Panel:  Recent Labs  Lab 12/12/22 2203 12/13/22 0545 12/14/22 0549  NA 138 137 140  K 3.4* 4.0 4.8  CL 101 99 101  CO2 27 28 33*  GLUCOSE 111* 204* 145*  BUN 13 12 16   CREATININE 0.91 0.81 0.86  CALCIUM 8.8* 8.6* 8.8*  MG  --  2.2  --    GFR: Estimated Creatinine Clearance: 100.5 mL/min (by C-G formula based on SCr of 0.86 mg/dL). Liver Function Tests:  Recent Labs  Lab 12/12/22 2203  AST 19  ALT 16  ALKPHOS 97  BILITOT 0.8  PROT 6.8  ALBUMIN 3.8   Recent Results (from the past 240 hour(s))  Resp panel by RT-PCR (RSV, Flu A&B, Covid) Anterior Nasal Swab     Status: None   Collection Time: 12/12/22 11:25 PM   Specimen: Anterior Nasal Swab  Result Value Ref Range Status   SARS Coronavirus 2 by RT PCR NEGATIVE NEGATIVE Final    Comment: (NOTE) SARS-CoV-2 target nucleic acids are NOT DETECTED.  The SARS-CoV-2 RNA is generally detectable in upper respiratory specimens during the acute phase of infection. The lowest concentration of SARS-CoV-2 viral copies this assay can detect is 138 copies/mL. A negative result does not preclude SARS-Cov-2 infection and should not be used as the sole basis for treatment or other patient management decisions. A negative result may occur with  improper specimen collection/handling, submission of specimen other than nasopharyngeal swab, presence of viral mutation(s) within the areas targeted by this assay, and inadequate number of viral copies(<138 copies/mL). A negative result must be combined with clinical observations, patient history, and epidemiological information. The expected result is Negative.  Fact Sheet for Patients:   BloggerCourse.com  Fact Sheet for Healthcare Providers:  SeriousBroker.it  This test is no t yet approved or cleared by the [place] FDA and  has been authorized for detection and/or diagnosis of SARS-CoV-2 by FDA under an Emergency Use Authorization (EUA). This EUA will remain  in effect (meaning this test can be used) for the duration of the COVID-19 declaration under Section 564(b)(1) of the Act, 21 U.S.C.section 360bbb-3(b)(1), unless the authorization is terminated  or revoked sooner.       Influenza A by PCR NEGATIVE NEGATIVE Final   Influenza B by PCR NEGATIVE NEGATIVE Final    Comment: (NOTE) The Xpert Xpress SARS-CoV-2/FLU/RSV plus assay is intended as an aid in the diagnosis of influenza from Nasopharyngeal swab specimens and should not be used as a sole basis for treatment. Nasal washings and aspirates are unacceptable for Xpert Xpress SARS-CoV-2/FLU/RSV testing.  Fact  Sheet for Patients: BloggerCourse.com  Fact Sheet for Healthcare Providers: SeriousBroker.it  This test is not yet approved or cleared by the [place] FDA and has been authorized for detection and/or diagnosis of SARS-CoV-2 by FDA under an Emergency Use Authorization (EUA). This EUA will remain in effect (meaning this test can be used) for the duration of the COVID-19 declaration under Section 564(b)(1) of the Act, 21 U.S.C. section 360bbb-3(b)(1), unless the authorization is terminated or revoked.     Resp Syncytial Virus by PCR NEGATIVE NEGATIVE Final    Comment: (NOTE) Fact Sheet for Patients: BloggerCourse.com  Fact Sheet for Healthcare Providers: SeriousBroker.it  This test is not yet approved or cleared by the [place] FDA and has been authorized for detection and/or diagnosis of SARS-CoV-2 by FDA under an Emergency Use  Authorization (EUA). This EUA will remain in effect (meaning this test can be used) for the duration of the COVID-19 declaration under Section 564(b)(1) of the Act, 21 U.S.C. section 360bbb-3(b)(1), unless the authorization is terminated or revoked.  Performed at [facility], [address] [place], [place] [address]   Expectorated Sputum Assessment w Gram Stain, Rflx to Resp Cult     Status: None   Collection Time: 12/13/22  5:22 AM   Specimen: Expectorated Sputum  Result Value Ref Range Status   Specimen Description EXPECTORATED SPUTUM  Final   Special Requests NONE  Final   Sputum evaluation   Final    THIS SPECIMEN IS ACCEPTABLE FOR SPUTUM CULTURE Performed at [facility], [address], [place], [place] [address]    Report Status 12/13/2022 FINAL  Final  Culture, Respiratory w Gram Stain     Status: None (Preliminary result)   Collection Time: 12/13/22  5:22 AM  Result Value Ref Range Status   Specimen Description   Final    EXPECTORATED SPUTUM Performed at [facility], [address] [place], [place] [address]    Special Requests   Final    NONE Reflexed from [id number] Performed at [facility], [address], [place], [place] [address]    Gram Stain   Final    RARE WBC PRESENT, PREDOMINANTLY PMN FEW GRAM POSITIVE COCCI IN PAIRS IN CLUSTERS FEW GRAM POSITIVE RODS FEW GRAM NEGATIVE RODS Performed at [facility], [address], [place], [place] [address]    Culture PENDING  Incomplete   Report Status PENDING  Incomplete    Antimicrobials: Anti-infectives (From admission, onward)    Start     Dose/Rate Route Frequency Ordered Stop   12/13/22 0145  cefTRIAXone (ROCEPHIN) 1 g in sodium chloride 0.9 % 100 mL IVPB        1 g 200 mL/hr over 30 Minutes Intravenous Every 24 hours 12/13/22 0135 12/18/22 0144      Culture/Microbiology    Component Value Date/Time   SDES EXPECTORATED  SPUTUM 12/13/2022 0522   SDES  12/13/2022 0522    EXPECTORATED SPUTUM Performed at [facility], [address] [place], [place] [address]    SPECREQUEST NONE 12/13/2022 0522   SPECREQUEST  12/13/2022 0522    NONE Reflexed from [id number] Performed at [facility], [address] [place], [place] [address]    CULT PENDING 12/13/2022 0522   REPTSTATUS 12/13/2022 FINAL 12/13/2022 0522   REPTSTATUS PENDING 12/13/2022 0522    Radiology Studies: DG Chest 2 View  Result Date: 12/12/2022 CLINICAL DATA:  Shortness of breath. History of  COPD and bronchitis. EXAM: CHEST - 2 VIEW COMPARISON:  CTA chest 05/21/2022. FINDINGS: The heart is slightly enlarged. No vascular congestion is seen. The mediastinum is normally outlined. There is mild aortic atherosclerosis. The lungs are mildly emphysematous but clear. There is no substantial pleural effusion. 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 IMPRESSION: No evidence of acute chest disease. Mild cardiomegaly. Emphysema. Aortic atherosclerosis. Electronically Signed   By: Almira Bar M.D.   On: 12/12/2022 22:34     LOS: 1 day   Total time spent in review of labs and imaging, patient evaluation, formulation of plan, documentation and communication with family: 35 minutes  Lanae Boast, MD [facility]  12/14/2022, 11:51 AM

## 2022-12-14 NOTE — Plan of Care (Signed)

## 2022-12-15 DIAGNOSIS — J441 Chronic obstructive pulmonary disease with (acute) exacerbation: Secondary | ICD-10-CM | POA: Diagnosis not present

## 2022-12-15 MED ORDER — SODIUM CHLORIDE 0.9 % IV SOLN
2.0000 g | Freq: Three times a day (TID) | INTRAVENOUS | Status: DC
  Administered 2022-12-15 – 2022-12-16 (×3): 2 g via INTRAVENOUS
  Filled 2022-12-15 (×3): qty 12.5

## 2022-12-15 NOTE — Plan of Care (Signed)
  Problem: Education: Goal: Knowledge of General Education information will improve Description: Including pain rating scale, medication(s)/side effects and non-pharmacologic comfort measures Outcome: Progressing   Problem: Clinical Measurements: Goal: Ability to maintain clinical measurements within normal limits will improve Outcome: Progressing Goal: Respiratory complications will improve Outcome: Progressing   Problem: Activity: Goal: Risk for activity intolerance will decrease Outcome: Progressing   Problem: Pain Management: Goal: General experience of comfort will improve Outcome: Progressing   Problem: Safety: Goal: Ability to remain free from injury will improve Outcome: Progressing   Problem: Skin Integrity: Goal: Risk for impaired skin integrity will decrease Outcome: Progressing   Problem: Respiratory: Goal: Levels of oxygenation will improve Outcome: Progressing

## 2022-12-15 NOTE — Plan of Care (Signed)

## 2022-12-15 NOTE — Progress Notes (Signed)
PROGRESS NOTE [name]  GEX:[id number] DOB: 1955/08/05 DOA: 12/12/2022 PCP: Hoy Register, MD  Brief Narrative/Hospital Course: 74 yom w/ HTN HLD and COPD presented with shortness of breath w/ cough increasing over the past week which has gotten worse despite nebulizer at home the past 24 hours In WN:[id number] and saturating mid 80s on room air with tachypnea, mild tachycardia, and stable blood pressure. CXR>>negative for acute findings.Labs are notable for negative respiratory virus panel, normal WBC, normal renal function, and normal BNP.  Patient was placed on supplemental oxygen and started on steroid bronchodilators and admitted for COPD exacerbation    Subjective: Patient seen and examined this morning he is on room air  Having cough still appears short of breath with chest tightness but overall feeling better  ~ 50%  Assessment and Plan: Active Problems:   Acute respiratory failure with hypoxia (HCC)   Essential hypertension   Hyperlipidemia   COPD exacerbation (HCC)  Acute COPD exacerbation Acute hypoxic respiratory failure: Patient with baseline COPD, on Breztri DuoNeb Ventolin HFA and Lasix at home, symptoms slowly progressing, chest x-ray without pneumonia, blood gas shows hypoxia.  Still having chest tightness but off oxygen.   Continue on current IV steroid,antitussives,budesonide, Brovana and Yupelri nebulizer empiric antibiotics Mobilize  Hypertension: BP is controlled. Not on meds, monitor  Active smoking: We discussed extensively and he is motivated to quit.Declined nicotine patch  Obesity:Patient's Body mass index is 33.97 kg/m. : Will benefit with PCP follow-up, weight loss  healthy lifestyle and outpatient sleep evaluation.  DVT prophylaxis: enoxaparin (LOVENOX) injection 40 mg Start: 12/13/22 1000 Code Status:   Code Status: Full Code Family Communication: plan of care discussed with patient at bedside. Patient status is: Inpatient because of  COPD exacerbation Level of care: Telemetry   Dispo: The patient is from: Home            Anticipated disposition: Home tomorrow if improves  Objective: Vitals last 24 hrs: Vitals:   12/14/22 2021 12/15/22 0516 12/15/22 0743 12/15/22 0746  BP: (!) 147/78 130/65    Pulse: 92 69    Resp: 18 18    Temp: 98 F (36.7 C) 97.8 F (36.6 C)    TempSrc: Oral Oral    SpO2: 93% 98% 90% 90%  Weight:      Height:       Weight change:   Physical Examination: General exam: alert awake, oriented at baseline, older than stated age HEENT:Oral mucosa moist, Ear/Nose WNL grossly Respiratory system: Bilaterally expiratory wheezing,no use of accessory muscle Cardiovascular system: S1 & S2 +, No JVD. Gastrointestinal system: Abdomen soft,NT,ND, BS+ Nervous System: Alert, awake, moving all extremities,and following commands. Extremities: LE edema neg,distal peripheral pulses palpable and warm.  Skin: No rashes,no icterus. MSK: Normal muscle bulk,tone, power   Medications reviewed:  Scheduled Meds:  arformoterol  15 mcg Nebulization BID   budesonide (PULMICORT) nebulizer solution  0.25 mg Nebulization BID   enoxaparin (LOVENOX) injection  40 mg Subcutaneous Q24H   guaiFENesin  600 mg Oral BID   methylPREDNISolone (SOLU-MEDROL) injection  80 mg Intravenous Q12H   revefenacin  175 mcg Nebulization Daily   sodium chloride flush  3 mL Intravenous Q12H   Continuous Infusions:  cefTRIAXone (ROCEPHIN)  IV 1 g (12/15/22 0140)    Diet Order             Diet regular Room service appropriate? Yes; Fluid consistency: Thin  Diet effective now  Intake/Output Summary (Last 24 hours) at 12/15/2022 1158 Last data filed at 12/15/2022 0900 Gross per 24 hour  Intake 480 ml  Output --  Net 480 ml   Net IO Since Admission: 583 mL [12/15/22 1158]  Wt Readings from Last 3 Encounters:  12/12/22 104.3 kg  12/03/22 101.6 kg  10/10/22 105.2 kg     Unresulted Labs (From admission,  onward)     Start     Ordered   12/20/22 0500  Creatinine, serum  (enoxaparin (LOVENOX)    CrCl >/= 30 ml/min)  Weekly,   R     Comments: while on enoxaparin therapy    12/13/22 0135          Data Reviewed: I have personally reviewed following labs and imaging studies CBC: Recent Labs  Lab 12/12/22 2203 12/13/22 0545 12/14/22 0549  WBC 6.0 4.0 10.2  NEUTROABS 3.3  --   --   HGB 15.2 15.7 14.4  HCT 44.2 47.6 44.8  MCV 96.5 98.6 100.9*  PLT 151 150 159   Basic Metabolic Panel:  Recent Labs  Lab 12/12/22 2203 12/13/22 0545 12/14/22 0549  NA 138 137 140  K 3.4* 4.0 4.8  CL 101 99 101  CO2 27 28 33*  GLUCOSE 111* 204* 145*  BUN 13 12 16   CREATININE 0.91 0.81 0.86  CALCIUM 8.8* 8.6* 8.8*  MG  --  2.2  --    GFR: Estimated Creatinine Clearance: 100.5 mL/min (by C-G formula based on SCr of 0.86 mg/dL). Liver Function Tests:  Recent Labs  Lab 12/12/22 2203  AST 19  ALT 16  ALKPHOS 97  BILITOT 0.8  PROT 6.8  ALBUMIN 3.8   Recent Results (from the past 240 hour(s))  Resp panel by RT-PCR (RSV, Flu A&B, Covid) Anterior Nasal Swab     Status: None   Collection Time: 12/12/22 11:25 PM   Specimen: Anterior Nasal Swab  Result Value Ref Range Status   SARS Coronavirus 2 by RT PCR NEGATIVE NEGATIVE Final    Comment: (NOTE) SARS-CoV-2 target nucleic acids are NOT DETECTED.  The SARS-CoV-2 RNA is generally detectable in upper respiratory specimens during the acute phase of infection. The lowest concentration of SARS-CoV-2 viral copies this assay can detect is 138 copies/mL. A negative result does not preclude SARS-Cov-2 infection and should not be used as the sole basis for treatment or other patient management decisions. A negative result may occur with  improper specimen collection/handling, submission of specimen other than nasopharyngeal swab, presence of viral mutation(s) within the areas targeted by this assay, and inadequate number of viral copies(<138  copies/mL). A negative result must be combined with clinical observations, patient history, and epidemiological information. The expected result is Negative.  Fact Sheet for Patients:  BloggerCourse.com  Fact Sheet for Healthcare Providers:  SeriousBroker.it  This test is no t yet approved or cleared by the [place] FDA and  has been authorized for detection and/or diagnosis of SARS-CoV-2 by FDA under an Emergency Use Authorization (EUA). This EUA will remain  in effect (meaning this test can be used) for the duration of the COVID-19 declaration under Section 564(b)(1) of the Act, 21 U.S.C.section 360bbb-3(b)(1), unless the authorization is terminated  or revoked sooner.       Influenza A by PCR NEGATIVE NEGATIVE Final   Influenza B by PCR NEGATIVE NEGATIVE Final    Comment: (NOTE) The Xpert Xpress SARS-CoV-2/FLU/RSV plus assay is intended as an aid in the diagnosis of influenza from Nasopharyngeal  swab specimens and should not be used as a sole basis for treatment. Nasal washings and aspirates are unacceptable for Xpert Xpress SARS-CoV-2/FLU/RSV testing.  Fact Sheet for Patients: BloggerCourse.com  Fact Sheet for Healthcare Providers: SeriousBroker.it  This test is not yet approved or cleared by the [place] FDA and has been authorized for detection and/or diagnosis of SARS-CoV-2 by FDA under an Emergency Use Authorization (EUA). This EUA will remain in effect (meaning this test can be used) for the duration of the COVID-19 declaration under Section 564(b)(1) of the Act, 21 U.S.C. section 360bbb-3(b)(1), unless the authorization is terminated or revoked.     Resp Syncytial Virus by PCR NEGATIVE NEGATIVE Final    Comment: (NOTE) Fact Sheet for Patients: BloggerCourse.com  Fact Sheet for Healthcare  Providers: SeriousBroker.it  This test is not yet approved or cleared by the [place] FDA and has been authorized for detection and/or diagnosis of SARS-CoV-2 by FDA under an Emergency Use Authorization (EUA). This EUA will remain in effect (meaning this test can be used) for the duration of the COVID-19 declaration under Section 564(b)(1) of the Act, 21 U.S.C. section 360bbb-3(b)(1), unless the authorization is terminated or revoked.  Performed at [facility], [address] [place], [place] [address]   Expectorated Sputum Assessment w Gram Stain, Rflx to Resp Cult     Status: None   Collection Time: 12/13/22  5:22 AM   Specimen: Expectorated Sputum  Result Value Ref Range Status   Specimen Description EXPECTORATED SPUTUM  Final   Special Requests NONE  Final   Sputum evaluation   Final    THIS SPECIMEN IS ACCEPTABLE FOR SPUTUM CULTURE Performed at [facility], [address] [place], [place] [address]    Report Status 12/13/2022 FINAL  Final  Culture, Respiratory w Gram Stain     Status: None (Preliminary result)   Collection Time: 12/13/22  5:22 AM  Result Value Ref Range Status   Specimen Description   Final    EXPECTORATED SPUTUM Performed at [facility], [address], [place], [place] [address]    Special Requests   Final    NONE Reflexed from [id number] Performed at [facility], [address] [place], [place] [address]    Gram Stain   Final    RARE WBC PRESENT, PREDOMINANTLY PMN FEW GRAM POSITIVE COCCI IN PAIRS IN CLUSTERS FEW GRAM POSITIVE RODS FEW GRAM NEGATIVE RODS    Culture   Final    FEW PSEUDOMONAS AERUGINOSA SUSCEPTIBILITIES TO FOLLOW Performed at [facility], [address], [place], [place] [address]    Report Status PENDING  Incomplete    Antimicrobials: Anti-infectives (From admission, onward)    Start     Dose/Rate Route  Frequency Ordered Stop   12/13/22 0145  cefTRIAXone (ROCEPHIN) 1 g in sodium chloride 0.9 % 100 mL IVPB        1 g 200 mL/hr over 30 Minutes Intravenous Every 24 hours 12/13/22 0135 12/18/22 0144      Culture/Microbiology    Component Value Date/Time   SDES EXPECTORATED SPUTUM 12/13/2022 0522   SDES  12/13/2022 0522    EXPECTORATED SPUTUM Performed at [facility], [address] [place], [place] [address]    SPECREQUEST NONE 12/13/2022 0522   SPECREQUEST  12/13/2022 0522    NONE Reflexed from [id number] Performed at [facility], [address] [place], [place] [address]    CULT  12/13/2022 0522    FEW PSEUDOMONAS AERUGINOSA SUSCEPTIBILITIES TO FOLLOW Performed at [facility], [address], [place], [place] [address]    REPTSTATUS 12/13/2022 FINAL 12/13/2022 0522   REPTSTATUS PENDING 12/13/2022 0522    Radiology Studies: No results found.   LOS: 2 days   Total time spent in review of labs and imaging, patient evaluation, formulation of plan, documentation and communication with family: 35 minutes  Lanae Boast, MD [facility]  12/15/2022, 11:58 AM

## 2022-12-16 ENCOUNTER — Other Ambulatory Visit (HOSPITAL_COMMUNITY): Payer: Self-pay

## 2022-12-16 DIAGNOSIS — J441 Chronic obstructive pulmonary disease with (acute) exacerbation: Secondary | ICD-10-CM | POA: Diagnosis not present

## 2022-12-16 LAB — CULTURE, RESPIRATORY W GRAM STAIN

## 2022-12-16 MED ORDER — FUROSEMIDE 40 MG PO TABS
40.0000 mg | ORAL_TABLET | Freq: Every day | ORAL | 0 refills | Status: DC
  Filled 2022-12-16: qty 7, 7d supply, fill #0

## 2022-12-16 MED ORDER — SACCHAROMYCES BOULARDII 250 MG PO CAPS
250.0000 mg | ORAL_CAPSULE | Freq: Two times a day (BID) | ORAL | 0 refills | Status: AC
  Filled 2022-12-16: qty 28, 14d supply, fill #0

## 2022-12-16 MED ORDER — LEVOFLOXACIN 750 MG PO TABS
750.0000 mg | ORAL_TABLET | Freq: Every day | ORAL | 0 refills | Status: AC
Start: 2022-12-16 — End: 2022-12-20
  Filled 2022-12-16: qty 4, 4d supply, fill #0

## 2022-12-16 MED ORDER — ATORVASTATIN CALCIUM 20 MG PO TABS
20.0000 mg | ORAL_TABLET | Freq: Every day | ORAL | 0 refills | Status: DC
  Filled 2022-12-16: qty 30, 30d supply, fill #0

## 2022-12-16 MED ORDER — FUROSEMIDE 10 MG/ML IJ SOLN
40.0000 mg | Freq: Once | INTRAMUSCULAR | Status: AC
  Administered 2022-12-16: 40 mg via INTRAVENOUS
  Filled 2022-12-16: qty 4

## 2022-12-16 MED ORDER — FUROSEMIDE 40 MG PO TABS: 40.0000 mg | ORAL_TABLET | Freq: Every day | ORAL | Status: DC

## 2022-12-16 MED ORDER — PREDNISONE 10 MG PO TABS
ORAL_TABLET | ORAL | 0 refills | Status: DC
  Filled 2022-12-16: qty 30, 11d supply, fill #0

## 2022-12-16 NOTE — Discharge Summary (Signed)
Physician Discharge Summary  [name] YQM:[id number] DOB: [birth year] DOA: 12/12/2022  PCP: Hoy Register, MD  Admit date: 12/12/2022 Discharge date: 12/16/2022 Recommendations for Outpatient Follow-up:  Follow up with PCP in 1 weeks-call for appointment Please obtain BMP/CBC in one week  Discharge Dispo: Home Discharge Condition: Stable Code Status:   Code Status: Full Code Diet recommendation:  Diet Order             Diet regular Room service appropriate? Yes; Fluid consistency: Thin  Diet effective now                    Brief/Interim Summary: 67 yom w/ HTN HLD and COPD presented with shortness of breath w/ cough increasing over the past week which has gotten worse despite nebulizer at home the past 24 hours In BM:[id number] and saturating mid 80s on room air with tachypnea, mild tachycardia, and stable blood pressure. CXR>>negative for acute findings.Labs are notable for negative respiratory virus panel, normal WBC, normal renal function, and normal BNP.  Patient was placed on supplemental oxygen and started on steroid bronchodilators and admitted for COPD exacerbation Patient is slow to improve.  Sputum culture showed Pseudomonas pansensitive.  Gram-positive cocci and gram negative rods and gram-positive rods-antibiotic adjusted to cefepime while inpatient. He has weaned off to room air.  At this time respiratory status stable doing much better.  He will be discharged home   Discharge Diagnoses:  Active Problems:   Acute respiratory failure with hypoxia (HCC)   Essential hypertension   Hyperlipidemia   COPD exacerbation (HCC)  Acute COPD exacerbation Acute hypoxic respiratory failure: Patient with baseline COPD, on Breztri DuoNeb Ventolin HFA and Lasix at home, symptoms slowly progressing, chest x-ray without pneumonia, blood gas shows hypoxia.  Respiratory culture with Pseudomonas likely contributing to bacterial bronchitis will discharge on oral quinolones  discussed with pharmacy, was treated with IV cefepime here.  Breathing much improved although has mild expiratory wheezing, is less symptomatic, off oxygen bleeding well.  Will discharge him on prednisone taper along with home inhalers.   Hypertension: BP is controlled. Not on meds, monitor  Active smoking: We discussed extensively and he is motivated to quit.Declined nicotine patch  Obesity:Patient's Body mass index is 33.97 kg/m. : Will benefit with PCP follow-up, weight loss  healthy lifestyle and outpatient sleep evaluation.  Consults: None Subjective: Alert awake oriented resting comfortably he feels comfortable going home today  Discharge Exam: Vitals:   12/16/22 0614 12/16/22 0841  BP: (!) 171/107   Pulse: 82   Resp: 14   Temp: (!) 97.4 F (36.3 C)   SpO2: 93% 92%   General: Pt is alert, awake, not in acute distress Cardiovascular: RRR, S1/S2 +, no rubs, no gallops Respiratory: CTA bilaterally, no wheezing, no rhonchi Abdominal: Soft, NT, ND, bowel sounds + Extremities: no edema, no cyanosis  Discharge Instructions  Discharge Instructions     Discharge instructions   Complete by: As directed    Please call call MD or return to ER for similar or worsening recurring problem that brought you to hospital or if any fever,nausea/vomiting,abdominal pain, uncontrolled pain, chest pain,  shortness of breath or any other alarming symptoms.  Please follow-up your doctor as instructed in a week time and call the office for appointment.  Please avoid alcohol, smoking, or any other illicit substance and maintain healthy habits including taking your regular medications as prescribed.  You were cared for by a hospitalist during your hospital stay. If you have  any questions about your discharge medications or the care you received while you were in the hospital after you are discharged, you can call the unit and ask to speak with the hospitalist on call if the hospitalist that took  care of you is not available.  Once you are discharged, your primary care physician will handle any further medical issues. Please note that NO REFILLS for any discharge medications will be authorized once you are discharged, as it is imperative that you return to your primary care physician (or establish a relationship with a primary care physician if you do not have one) for your aftercare needs so that they can reassess your need for medications and monitor your lab values   Increase activity slowly   Complete by: As directed       Allergies as of 12/16/2022       Reactions   Codeine Itching   Penicillins Itching, Other (See Comments)        Medication List     STOP taking these medications    tiZANidine 4 MG tablet Commonly known as: Zanaflex       TAKE these medications    albuterol 108 (90 Base) MCG/ACT inhaler Commonly known as: Ventolin HFA Inhale 2 puffs into the lungs every 6 (six) hours as needed for wheezing or shortness of breath.   atorvastatin 20 MG tablet Commonly known as: LIPITOR Take 1 tablet (20 mg total) by mouth daily.   Breztri Aerosphere 160-9-4.8 MCG/ACT Aero Generic drug: Budeson-Glycopyrrol-Formoterol Inhale 2 puffs into the lungs 2 (two) times daily.   furosemide 40 MG tablet Commonly known as: Lasix Take 1 tablet (40 mg total) by mouth daily for 7 days.   ipratropium-albuterol 0.5-2.5 (3) MG/3ML Soln Commonly known as: DUONEB Take 3 mLs by nebulization every 6 (six) hours as needed (severe wheezing).   levofloxacin 750 MG tablet Commonly known as: Levaquin Take 1 tablet (750 mg total) by mouth daily for 4 days.   Misc. Devices Misc Nebulizer device.  Diagnosis COPD   predniSONE 10 MG tablet Commonly known as: DELTASONE Take PO 4 tabs daily x 3 days,3 tabs daily x 3 days,2 tabs daily x 3 days,1 tab daily x 2 days then stop.   saccharomyces boulardii 250 MG capsule Commonly known as: Florastor Take 1 capsule (250 mg total) by  mouth 2 (two) times daily for 14 days.        Follow-up Information     Hoy Register, MD Follow up in 1 week(s).   Specialty: Family Medicine Contact information: [address] [place] [address] [phone]                Allergies  Allergen Reactions   Codeine Itching   Penicillins Itching and Other (See Comments)    The results of significant diagnostics from this hospitalization (including imaging, microbiology, ancillary and laboratory) are listed below for reference.    Microbiology: Recent Results (from the past 240 hour(s))  Resp panel by RT-PCR (RSV, Flu A&B, Covid) Anterior Nasal Swab     Status: None   Collection Time: 12/12/22 11:25 PM   Specimen: Anterior Nasal Swab  Result Value Ref Range Status   SARS Coronavirus 2 by RT PCR NEGATIVE NEGATIVE Final    Comment: (NOTE) SARS-CoV-2 target nucleic acids are NOT DETECTED.  The SARS-CoV-2 RNA is generally detectable in upper respiratory specimens during the acute phase of infection. The lowest concentration of SARS-CoV-2 viral copies this assay can detect  is 138 copies/mL. A negative result does not preclude SARS-Cov-2 infection and should not be used as the sole basis for treatment or other patient management decisions. A negative result may occur with  improper specimen collection/handling, submission of specimen other than nasopharyngeal swab, presence of viral mutation(s) within the areas targeted by this assay, and inadequate number of viral copies(<138 copies/mL). A negative result must be combined with clinical observations, patient history, and epidemiological information. The expected result is Negative.  Fact Sheet for Patients:  BloggerCourse.com  Fact Sheet for Healthcare Providers:  SeriousBroker.it  This test is no t yet approved or cleared by the [place] FDA and  has been authorized for detection and/or  diagnosis of SARS-CoV-2 by FDA under an Emergency Use Authorization (EUA). This EUA will remain  in effect (meaning this test can be used) for the duration of the COVID-19 declaration under Section 564(b)(1) of the Act, 21 U.S.C.section 360bbb-3(b)(1), unless the authorization is terminated  or revoked sooner.       Influenza A by PCR NEGATIVE NEGATIVE Final   Influenza B by PCR NEGATIVE NEGATIVE Final    Comment: (NOTE) The Xpert Xpress SARS-CoV-2/FLU/RSV plus assay is intended as an aid in the diagnosis of influenza from Nasopharyngeal swab specimens and should not be used as a sole basis for treatment. Nasal washings and aspirates are unacceptable for Xpert Xpress SARS-CoV-2/FLU/RSV testing.  Fact Sheet for Patients: BloggerCourse.com  Fact Sheet for Healthcare Providers: SeriousBroker.it  This test is not yet approved or cleared by the [place] FDA and has been authorized for detection and/or diagnosis of SARS-CoV-2 by FDA under an Emergency Use Authorization (EUA). This EUA will remain in effect (meaning this test can be used) for the duration of the COVID-19 declaration under Section 564(b)(1) of the Act, 21 U.S.C. section 360bbb-3(b)(1), unless the authorization is terminated or revoked.     Resp Syncytial Virus by PCR NEGATIVE NEGATIVE Final    Comment: (NOTE) Fact Sheet for Patients: BloggerCourse.com  Fact Sheet for Healthcare Providers: SeriousBroker.it  This test is not yet approved or cleared by the [place] FDA and has been authorized for detection and/or diagnosis of SARS-CoV-2 by FDA under an Emergency Use Authorization (EUA). This EUA will remain in effect (meaning this test can be used) for the duration of the COVID-19 declaration under Section 564(b)(1) of the Act, 21 U.S.C. section 360bbb-3(b)(1), unless the authorization is terminated  or revoked.  Performed at [facility], [address] [place], [place] [address]   Expectorated Sputum Assessment w Gram Stain, Rflx to Resp Cult     Status: None   Collection Time: 12/13/22  5:22 AM   Specimen: Expectorated Sputum  Result Value Ref Range Status   Specimen Description EXPECTORATED SPUTUM  Final   Special Requests NONE  Final   Sputum evaluation   Final    THIS SPECIMEN IS ACCEPTABLE FOR SPUTUM CULTURE Performed at [facility], [address], [place], [place] [address]    Report Status 12/13/2022 FINAL  Final  Culture, Respiratory w Gram Stain     Status: None   Collection Time: 12/13/22  5:22 AM  Result Value Ref Range Status   Specimen Description   Final    EXPECTORATED SPUTUM Performed at [facility], [address] [place], [place] [address]    Special Requests   Final    NONE Reflexed from [id number] Performed at [facility], [address] [place], [place]  [address]    Gram Stain   Final    RARE WBC PRESENT, PREDOMINANTLY PMN FEW GRAM POSITIVE COCCI IN PAIRS IN CLUSTERS FEW GRAM POSITIVE RODS FEW GRAM NEGATIVE RODS Performed at [facility], [address] [place], [place] [address]    Culture FEW PSEUDOMONAS AERUGINOSA  Final   Report Status 12/16/2022 FINAL  Final   Organism ID, Bacteria PSEUDOMONAS AERUGINOSA  Final      Susceptibility   Pseudomonas aeruginosa - MIC*    CEFTAZIDIME 2 SENSITIVE Sensitive     CIPROFLOXACIN <=0.25 SENSITIVE Sensitive     GENTAMICIN <=1 SENSITIVE Sensitive     IMIPENEM <=0.25 SENSITIVE Sensitive     PIP/TAZO <=4 SENSITIVE Sensitive ug/mL    CEFEPIME 0.5 SENSITIVE Sensitive     * FEW PSEUDOMONAS AERUGINOSA    Procedures/Studies: DG Tibia/Fibula Right  Result Date: 12/12/2022 CLINICAL DATA:  Right hip pain and right tibia fibula pain onset September 2024 but no recent injury. EXAM: RIGHT TIBIA AND FIBULA - 2 VIEW; DG HIP (WITH  OR WITHOUT PELVIS) 2-3V RIGHT COMPARISON:  None Available. FINDINGS: AP pelvis and AP and frog-leg right hip views: There is normal bone mineralization. There is no evidence of right hip fracture or dislocation. There is no AP evidence of pelvic fracture or diastasis. There is mild symmetric joint narrowing at the hips and small acetabular osteophytes. There are mild changes of pelvic enthesopathy, lumbar spondylosis and degenerative disc disease. Soft tissues are unremarkable apart from pelvic phleboliths. The pubic symphysis and SI joints are unremarkable as visualized. AP and lateral right tibia fibula: Normal bone mineralization. There is no evidence of fracture, dislocation or other focal bone abnormality. There are small osteochondral loose bodies in the anterior knee joint space, patellar enthesopathy and a small noninflammatory plantar calcaneal spur. Trace spurring at the undersurface of the lateral and medial malleoli is also seen. Regional soft tissues are unremarkable. IMPRESSION: 1. No evidence of fracture or dislocation of the right hip or right tibia/fibula. 2. Mild degenerative changes of the hips, with pelvic enthesopathy, lumbar spondylosis and degenerative disc disease. 3. Small osteochondral loose bodies in the anterior knee joint space. 4. Small noninflammatory plantar calcaneal spur. Electronically Signed   By: Almira Bar M.D.   On: 12/12/2022 22:39   DG Hip Unilat W OR W/O Pelvis 2-3 Views Right  Result Date: 12/12/2022 CLINICAL DATA:  Right hip pain and right tibia fibula pain onset September 2024 but no recent injury. EXAM: RIGHT TIBIA AND FIBULA - 2 VIEW; DG HIP (WITH OR WITHOUT PELVIS) 2-3V RIGHT COMPARISON:  None Available. FINDINGS: AP pelvis and AP and frog-leg right hip views: There is normal bone mineralization. There is no evidence of right hip fracture or dislocation. There is no AP evidence of pelvic fracture or diastasis. There is mild symmetric joint narrowing at the  hips and small acetabular osteophytes. There are mild changes of pelvic enthesopathy, lumbar spondylosis and degenerative disc disease. Soft tissues are unremarkable apart from pelvic phleboliths. The pubic symphysis and SI joints are unremarkable as visualized. AP and lateral right tibia fibula: Normal bone mineralization. There is no evidence of fracture, dislocation or other focal bone abnormality. There are small osteochondral loose bodies in the anterior knee joint space, patellar enthesopathy and a small noninflammatory plantar calcaneal spur. Trace spurring at the undersurface of the lateral and medial malleoli is also seen. Regional soft tissues are unremarkable. IMPRESSION: 1. No evidence of fracture or dislocation of the right hip or right  tibia/fibula. 2. Mild degenerative changes of the hips, with pelvic enthesopathy, lumbar spondylosis and degenerative disc disease. 3. Small osteochondral loose bodies in the anterior knee joint space. 4. Small noninflammatory plantar calcaneal spur. Electronically Signed   By: Almira Bar M.D.   On: 12/12/2022 22:39   DG Chest 2 View  Result Date: 12/12/2022 CLINICAL DATA:  Shortness of breath. History of COPD and bronchitis. EXAM: CHEST - 2 VIEW COMPARISON:  CTA chest 05/21/2022. FINDINGS: The heart is slightly enlarged. No vascular congestion is seen. The mediastinum is normally outlined. There is mild aortic atherosclerosis. The lungs are mildly emphysematous but clear. There is no substantial pleural effusion. 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 IMPRESSION: No evidence of acute chest disease. Mild cardiomegaly. Emphysema. Aortic atherosclerosis. Electronically Signed   By: Almira Bar M.D.   On: 12/12/2022 22:34    Labs: BNP (last 3 results) Recent Labs    05/11/22 1133 05/21/22 1940 12/12/22 2203  BNP 65.6 23.0 34.0   Basic Metabolic Panel: Recent Labs  Lab 12/12/22 2203  12/13/22 0545 12/14/22 0549  NA 138 137 140  K 3.4* 4.0 4.8  CL 101 99 101  CO2 27 28 33*  GLUCOSE 111* 204* 145*  BUN 13 12 16   CREATININE 0.91 0.81 0.86  CALCIUM 8.8* 8.6* 8.8*  MG  --  2.2  --    Liver Function Tests: Recent Labs  Lab 12/12/22 2203  AST 19  ALT 16  ALKPHOS 97  BILITOT 0.8  PROT 6.8  ALBUMIN 3.8   No results for input(s): "LIPASE", "AMYLASE" in the last 168 hours. No results for input(s): "AMMONIA" in the last 168 hours. CBC: Recent Labs  Lab 12/12/22 2203 12/13/22 0545 12/14/22 0549  WBC 6.0 4.0 10.2  NEUTROABS 3.3  --   --   HGB 15.2 15.7 14.4  HCT 44.2 47.6 44.8  MCV 96.5 98.6 100.9*  PLT 151 150 159   Cardiac Enzymes: No results for input(s): "CKTOTAL", "CKMB", "CKMBINDEX", "TROPONINI" in the last 168 hours. BNP: Invalid input(s): "POCBNP" CBG: No results for input(s): "GLUCAP" in the last 168 hours. D-Dimer No results for input(s): "DDIMER" in the last 72 hours. Hgb A1c No results for input(s): "HGBA1C" in the last 72 hours. Lipid Profile No results for input(s): "CHOL", "HDL", "LDLCALC", "TRIG", "CHOLHDL", "LDLDIRECT" in the last 72 hours. Thyroid function studies No results for input(s): "TSH", "T4TOTAL", "T3FREE", "THYROIDAB" in the last 72 hours.  Invalid input(s): "FREET3" Anemia work up No results for input(s): "VITAMINB12", "FOLATE", "FERRITIN", "TIBC", "IRON", "RETICCTPCT" in the last 72 hours. Urinalysis    Component Value Date/Time   COLORURINE YELLOW 06/11/2017 1927   APPEARANCEUR CLEAR 06/11/2017 1927   LABSPEC 1.014 06/11/2017 1927   PHURINE 5.0 06/11/2017 1927   GLUCOSEU NEGATIVE 06/11/2017 1927   HGBUR MODERATE (A) 06/11/2017 1927   BILIRUBINUR NEGATIVE 06/11/2017 1927   KETONESUR NEGATIVE 06/11/2017 1927   PROTEINUR NEGATIVE 06/11/2017 1927   UROBILINOGEN 1.0 05/28/2014 1848   NITRITE NEGATIVE 06/11/2017 1927   LEUKOCYTESUR NEGATIVE 06/11/2017 1927   Sepsis Labs Recent Labs  Lab 12/12/22 2203  12/13/22 0545 12/14/22 0549  WBC 6.0 4.0 10.2   Microbiology Recent Results (from the past 240 hour(s))  Resp panel by RT-PCR (RSV, Flu A&B, Covid) Anterior Nasal Swab     Status: None   Collection Time: 12/12/22 11:25 PM   Specimen: Anterior Nasal Swab  Result Value Ref Range Status   SARS Coronavirus  2 by RT PCR NEGATIVE NEGATIVE Final    Comment: (NOTE) SARS-CoV-2 target nucleic acids are NOT DETECTED.  The SARS-CoV-2 RNA is generally detectable in upper respiratory specimens during the acute phase of infection. The lowest concentration of SARS-CoV-2 viral copies this assay can detect is 138 copies/mL. A negative result does not preclude SARS-Cov-2 infection and should not be used as the sole basis for treatment or other patient management decisions. A negative result may occur with  improper specimen collection/handling, submission of specimen other than nasopharyngeal swab, presence of viral mutation(s) within the areas targeted by this assay, and inadequate number of viral copies(<138 copies/mL). A negative result must be combined with clinical observations, patient history, and epidemiological information. The expected result is Negative.  Fact Sheet for Patients:  BloggerCourse.com  Fact Sheet for Healthcare Providers:  SeriousBroker.it  This test is no t yet approved or cleared by the [place] FDA and  has been authorized for detection and/or diagnosis of SARS-CoV-2 by FDA under an Emergency Use Authorization (EUA). This EUA will remain  in effect (meaning this test can be used) for the duration of the COVID-19 declaration under Section 564(b)(1) of the Act, 21 U.S.C.section 360bbb-3(b)(1), unless the authorization is terminated  or revoked sooner.       Influenza A by PCR NEGATIVE NEGATIVE Final   Influenza B by PCR NEGATIVE NEGATIVE Final    Comment: (NOTE) The Xpert Xpress SARS-CoV-2/FLU/RSV plus assay is  intended as an aid in the diagnosis of influenza from Nasopharyngeal swab specimens and should not be used as a sole basis for treatment. Nasal washings and aspirates are unacceptable for Xpert Xpress SARS-CoV-2/FLU/RSV testing.  Fact Sheet for Patients: BloggerCourse.com  Fact Sheet for Healthcare Providers: SeriousBroker.it  This test is not yet approved or cleared by the [place] FDA and has been authorized for detection and/or diagnosis of SARS-CoV-2 by FDA under an Emergency Use Authorization (EUA). This EUA will remain in effect (meaning this test can be used) for the duration of the COVID-19 declaration under Section 564(b)(1) of the Act, 21 U.S.C. section 360bbb-3(b)(1), unless the authorization is terminated or revoked.     Resp Syncytial Virus by PCR NEGATIVE NEGATIVE Final    Comment: (NOTE) Fact Sheet for Patients: BloggerCourse.com  Fact Sheet for Healthcare Providers: SeriousBroker.it  This test is not yet approved or cleared by the [place] FDA and has been authorized for detection and/or diagnosis of SARS-CoV-2 by FDA under an Emergency Use Authorization (EUA). This EUA will remain in effect (meaning this test can be used) for the duration of the COVID-19 declaration under Section 564(b)(1) of the Act, 21 U.S.C. section 360bbb-3(b)(1), unless the authorization is terminated or revoked.  Performed at [facility], [address], [place], [place] [address]   Expectorated Sputum Assessment w Gram Stain, Rflx to Resp Cult     Status: None   Collection Time: 12/13/22  5:22 AM   Specimen: Expectorated Sputum  Result Value Ref Range Status   Specimen Description EXPECTORATED SPUTUM  Final   Special Requests NONE  Final   Sputum evaluation   Final    THIS SPECIMEN IS ACCEPTABLE FOR SPUTUM CULTURE Performed at [facility], [address], [place], [place] [address]    Report Status 12/13/2022 FINAL  Final  Culture, Respiratory w Gram Stain     Status: None   Collection Time: 12/13/22  5:22 AM  Result Value Ref Range Status   Specimen Description  Final    EXPECTORATED SPUTUM Performed at [facility], [address], [place], [place] [address]    Special Requests   Final    NONE Reflexed from [id number] Performed at [facility], [address] [place], [place] [address]    Gram Stain   Final    RARE WBC PRESENT, PREDOMINANTLY PMN FEW GRAM POSITIVE COCCI IN PAIRS IN CLUSTERS FEW GRAM POSITIVE RODS FEW GRAM NEGATIVE RODS Performed at [facility], [address], [place], [place] [address]    Culture FEW PSEUDOMONAS AERUGINOSA  Final   Report Status 12/16/2022 FINAL  Final   Organism ID, Bacteria PSEUDOMONAS AERUGINOSA  Final      Susceptibility   Pseudomonas aeruginosa - MIC*    CEFTAZIDIME 2 SENSITIVE Sensitive     CIPROFLOXACIN <=0.25 SENSITIVE Sensitive     GENTAMICIN <=1 SENSITIVE Sensitive     IMIPENEM <=0.25 SENSITIVE Sensitive     PIP/TAZO <=4 SENSITIVE Sensitive ug/mL    CEFEPIME 0.5 SENSITIVE Sensitive     * FEW PSEUDOMONAS AERUGINOSA     Time coordinating discharge: 35 minutes  SIGNED: Lanae Boast, MD  [facility] 12/16/2022, 11:27 AM  If 7PM-7AM, please contact night-coverage [email or web address]

## 2022-12-16 NOTE — Addendum Note (Signed)
Addended by: Roney Jaffe on: 12/16/2022 12:00 PM   Modules accepted: Orders

## 2022-12-16 NOTE — Progress Notes (Signed)
   12/16/22 1130  TOC Brief Assessment  Insurance and Status Reviewed  Patient has primary care physician Yes  Home environment has been reviewed Single family home  Prior level of function: Independent/modified independent  Prior/Current Home Services No current home services  Social Determinants of Health Reivew SDOH reviewed no interventions necessary  Readmission risk has been reviewed Yes  Transition of care needs no transition of care needs at this time

## 2022-12-16 NOTE — Plan of Care (Signed)

## 2022-12-17 ENCOUNTER — Telehealth: Payer: Self-pay | Admitting: Physical Therapy

## 2022-12-17 ENCOUNTER — Telehealth: Payer: Self-pay

## 2022-12-17 NOTE — Transitions of Care (Post Inpatient/ED Visit) (Signed)
12/17/2022  Name: [name] MRN: [id number] DOB: [birth year]  Today's TOC FU Call Status: Today's TOC FU Call Status:: Successful TOC FU Call Completed TOC FU Call Complete Date: 12/17/22 Patient's Name and Date of Birth confirmed.  Transition Care Management Follow-up Telephone Call Date of Discharge: 12/16/22 Discharge Facility: [facility]) Type of Discharge: Inpatient Admission Primary Inpatient Discharge Diagnosis:: Chronic Obstructive Pulmonary Disease (COPD) exacerbation How have you been since you were released from the hospital?: Better Any questions or concerns?: Yes Patient Questions/Concerns:: Patient is out of his [name] - wanted to know if he can refill - checked medications with patient - found he has 3 refills available of Breztri at [facility]  Items Reviewed: Did you receive and understand the discharge instructions provided?: Yes Medications obtained,verified, and reconciled?: Yes (Medications Reviewed) Any new allergies since your discharge?: No Dietary orders reviewed?: No Do you have support at home?: Yes People in Home: significant other  Medications Reviewed Today: Medications Reviewed Today     Reviewed by [name] (Registered Nurse) on 12/17/22 at 1211  Med List Status: <None>   Medication Order Taking? Sig Documenting Provider Last Dose Status Informant  albuterol (VENTOLIN HFA) 108 (90 Base) MCG/ACT inhaler [id number] Yes Inhale 2 puffs into the lungs every 6 (six) hours as needed for wheezing or shortness of breath. Daniel Sinner, MD Taking Active Self, Pharmacy Records  atorvastatin (LIPITOR) 20 MG tablet [id number] Yes Take 1 tablet (20 mg total) by mouth daily. Daniel Boast, MD Taking Active   Budeson-Glycopyrrol-Formoterol (BREZTRI AEROSPHERE) 160-9-4.8 MCG/ACT Sandrea Matte [id number] Yes Inhale 2 puffs into the lungs 2 (two) times daily. Daniel Sinner, MD Taking Active Self, Pharmacy Records   furosemide (LASIX) 40 MG tablet [id number] Yes Take 1 tablet (40 mg total) by mouth daily for 7 days. Daniel Boast, MD Taking Active   ipratropium-albuterol (DUONEB) 0.5-2.5 (3) MG/3ML SOLN [id number] No Take 3 mLs by nebulization every 6 (six) hours as needed (severe wheezing).  Patient not taking: Reported on 12/17/2022   [place], Eric J, DO Not Taking Active Self, Pharmacy Records  levofloxacin [facility]) 750 MG tablet [id number] Yes Take 1 tablet (750 mg total) by mouth daily for 4 days. Daniel Boast, MD Taking Active   Misc. Devices MISC [id number]  Nebulizer device.  Diagnosis COPD Daniel Register, MD  Active Self, Pharmacy Records  predniSONE (DELTASONE) 10 MG tablet [id number] Yes Take 4 tablets by mouth daily x 3 days THEN take 3 tablets daily x 3 days THEN take 2 tablets daily x 3 days THEN take 1 tablet daily x 2 days Kc, Dayna Barker, MD Taking Active   saccharomyces boulardii (FLORASTOR) 250 MG capsule [id number] Yes Take 1 capsule (250 mg total) by mouth 2 (two) times daily for 14 days. Daniel Boast, MD Taking Active             Home Care and Equipment/Supplies: Were Home Health Services Ordered?: No Any new equipment or medical supplies ordered?: No  Functional Questionnaire: Do you need assistance with bathing/showering or dressing?: No Do you need assistance with meal preparation?: No Do you need assistance with eating?: No Do you have difficulty maintaining continence: No Do you need assistance with getting out of bed/getting out of a chair/moving?: No Do you have difficulty managing or taking your medications?: No  Follow up appointments reviewed: PCP Follow-up appointment confirmed?: No (Encouraged patient to make a hospital follow up appointment with his PCP as soon as able ([name]  is listed); patient stated he had a follow up appointment with [facility] today) MD Provider Line Number:[phone] Given: No Specialist Hospital Follow-up appointment confirmed?: Yes Date  of Specialist follow-up appointment?: 12/18/22 Follow-Up Specialty Provider:: Ortho treatment with [name] at 845am Do you need transportation to your follow-up appointment?: No Do you understand care options if your condition(s) worsen?: Yes-patient verbalized understanding  Looking forward to speaking with you again, via telephone, a week from today, on December 10 @ 1pm  Daniel Low, Green, BA, [facility], CRRN [facility] Coordinator, Transition of Care Ph # [phone]

## 2022-12-17 NOTE — Telephone Encounter (Signed)
Called pt to discuss tomorrow's appt given change in medical status with recent hospitalization - he says he is doing well but has not yet followed up with PCP. Advised that with change in status we will require physician clearance or new referral to proceed with PT - he states he is okay to cancel tomorrow's visit and discharge at this point. States he has been doing exercises on his own that have been helpful, denies any questions or concerns.

## 2022-12-18 ENCOUNTER — Other Ambulatory Visit: Payer: Self-pay

## 2022-12-18 ENCOUNTER — Ambulatory Visit: Payer: Medicare HMO | Admitting: Physical Therapy

## 2022-12-19 ENCOUNTER — Ambulatory Visit: Payer: Self-pay

## 2022-12-19 NOTE — Patient Outreach (Signed)
  Care Coordination   Follow Up Visit Note   12/19/2022 Name: [name] MRN: [id number] DOB: 09-09-1955  [name] is a 67 y.o. year old male who sees Hoy Register, MD for primary care. I placed an unsuccessful call to patient today.   What matters to the patients health and wellness today?  NA    Goals Addressed             This Visit's Progress    To follow up with Pulmonology for evaluation of COPD       Care Coordination Interventions: Reviewed chart in preparation to contact patient  Noted patent was admitted to [facility], 12/12/22-12/16/22; dx: COPD exacerbation with acute repiratory failure with hypoxia Placed unsuccessful call to patient, sent message to care guide to reschedule missed nurse call        SDOH assessments and interventions completed:  No     Care Coordination Interventions:  Yes, provided   Follow up plan: Follow up call to be scheduled by care guide   Encounter Outcome:  Patient Visit Completed

## 2022-12-20 ENCOUNTER — Other Ambulatory Visit: Payer: Self-pay

## 2022-12-20 MED ORDER — BREZTRI AEROSPHERE 160-9-4.8 MCG/ACT IN AERO
INHALATION_SPRAY | RESPIRATORY_TRACT | 3 refills | Status: DC
  Filled 2023-02-19: qty 32.1, 90d supply, fill #0
  Filled 2023-04-14: qty 32.1, 90d supply, fill #1
  Filled 2023-05-30: qty 10.7, 30d supply, fill #1
  Filled 2023-05-30: qty 32.1, 90d supply, fill #1
  Filled 2023-05-30: qty 21.4, 60d supply, fill #1
  Filled 2023-08-25: qty 32.1, 90d supply, fill #2

## 2022-12-20 MED ORDER — ASPIRIN 81 MG PO TBEC
DELAYED_RELEASE_TABLET | ORAL | 3 refills | Status: DC
  Filled 2022-12-20: qty 90, 90d supply, fill #0

## 2022-12-20 MED ORDER — ATORVASTATIN CALCIUM 40 MG PO TABS
ORAL_TABLET | ORAL | 3 refills | Status: DC
  Filled 2022-12-20: qty 90, 90d supply, fill #0
  Filled 2023-04-21 (×2): qty 90, 90d supply, fill #1

## 2022-12-20 MED ORDER — ERGOCALCIFEROL 1.25 MG (50000 UT) PO CAPS
ORAL_CAPSULE | ORAL | 0 refills | Status: DC
  Filled 2022-12-20: qty 12, 84d supply, fill #0

## 2022-12-20 MED ORDER — FUROSEMIDE 40 MG PO TABS
ORAL_TABLET | ORAL | 3 refills | Status: DC
  Filled 2022-12-20: qty 90, 90d supply, fill #0

## 2022-12-20 MED ORDER — LOSARTAN POTASSIUM 25 MG PO TABS
ORAL_TABLET | ORAL | 3 refills | Status: DC
  Filled 2022-12-20: qty 90, 90d supply, fill #0
  Filled 2023-04-21: qty 90, 90d supply, fill #1

## 2022-12-24 ENCOUNTER — Other Ambulatory Visit: Payer: Self-pay

## 2022-12-24 ENCOUNTER — Telehealth: Payer: Self-pay

## 2022-12-24 NOTE — Patient Instructions (Signed)
Visit Information  Thank you for taking time to visit with me today. Please don't hesitate to contact me if I can be of assistance to you before our next scheduled telephone appointment.  Our next appointment is by telephone on 12/30/22 at 1pm  Following is a copy of your care plan:   Goals Addressed             This Visit's Progress    Transition of Care       Current Barriers:  Knowledge Deficits related to plan of care for management of COPD and Tobacco Use   RNCM Clinical Goal(s):  Patient will work with the Care Management team over the next 30 days to address Transition of Care Barriers: Medication access Medication Management Support at home Cutting down Tobacco usage  through collaboration with RN Care manager, provider, and care team. 12/10 tele-visit Update - patient states he has cut down to 6 cigarettes per day and doesn't smoke the first thing getting up in the morning or late at night Interventions: Evaluation of current treatment plan related to  self management and patient's adherence to plan as established by provider   COPD Interventions:  (Status:  Ongoing) Short Term Goal Advised patient to track and manage COPD triggers Advised patient to self assesses COPD action plan zone and make appointment with provider if in the yellow zone for 48 hours without improvement Advised patient to engage in light exercise as tolerated 3-5 days a week to aid in the the management of COPD Provided education about and advised patient to utilize infection prevention strategies to reduce risk of respiratory infection Discussed the importance of adequate rest and management of fatigue with COPD Discussed Pulmonary Rehab and offered to assist with referral placement Assessed social determinant of health barriers - no interventions needed   Smoking Cessation Interventions:  (Status:  On Hold pending patient's choice to move forward) Long Term Goal 12/10 Update - has cut down his  smoking to 6 cigarettes per day Reviewed smoking history:  tobacco abuse of 45 years; currently smoking 1 ppd Previous quit attempts, unknown  Reports triggers to smoke include: to be discussed when patient is ready  On a scale of 1-10, reports MOTIVATION to quit is unknown as of 12/3 On a scale of 1-10, reports CONFIDENCE in quitting is unknown as of 12/3   Patient Goals/Self-Care Activities: Participate in Transition of Care Program/Attend TOC scheduled calls Take all medications as prescribed Attend all scheduled provider appointments Call pharmacy for medication refills 3-7 days in advance of running out of medications Perform all self care activities independently  Call provider office for new concerns or questions   Follow Up Plan:  The patient has been provided with contact information for the care management team and has been advised to call with any health related questions or concerns.          Patient verbalizes understanding of instructions and care plan provided today and agrees to view in MyChart. Active MyChart status and patient understanding of how to access instructions and care plan via MyChart confirmed with patient.     The patient has been provided with contact information for the care management team and has been advised to call with any health related questions or concerns.   Please call the care guide team at [phone] if you need to cancel or reschedule your appointment.   Please call 1-800-273-TALK (toll free, 24 hour hotline) if you are experiencing a Mental Health or Behavioral  Health Crisis or need someone to talk to.  Alyse Low, RN, BA, [facility], CRRN [facility] Coordinator, Transition of Care Ph # [phone]

## 2022-12-24 NOTE — Patient Outreach (Signed)
Care Management  Transitions of Care Program Transitions of Care Post-discharge week 2   12/24/2022 Name: [name] MRN: [id number] DOB: October 27, 1955  Subjective: [name] is a 67 y.o. year old male who is a primary care patient of Hoy Register, MD. The Care Management team Engaged with patient Engaged with patient by telephone to assess and address transitions of care needs.   Consent to Services:  Patient was given information about care management services, agreed to services, and gave verbal consent to participate.   Assessment:           SDOH Interventions    Flowsheet Row ED to Hosp-Admission (Discharged) from 12/12/2022 in [facility] Telephone from 05/14/2022 in [facility] Coordination ED to Hosp-Admission (Discharged) from 05/10/2022 in [facility] Office Visit from 05/06/2022 in [facility] [place] [facility] Telephone from 07/19/2021 in [facility]  SDOH Interventions       Food Insecurity Interventions -- Intervention Not Indicated -- -- Other (Comment)  Barrington Ellison SNAP]  Housing Interventions Intervention Not Indicated, Inpatient TOC -- Intervention Not Indicated, Inpatient TOC  [Patient currently has housing.] Intervention Not Indicated Intervention Not Indicated  Transportation Interventions -- Intervention Not Indicated -- Intervention Not Indicated Intervention Not Indicated  Utilities Interventions -- -- -- Intervention Not Indicated --  Alcohol Usage Interventions -- -- -- Intervention Not Indicated (Score <7) --  Financial Strain Interventions -- -- -- -- Other (Comment)  [CAFA/OC]  Physical Activity Interventions -- -- -- Intervention Not Indicated --  Stress Interventions -- -- -- Intervention Not Indicated --        Goals Addressed             This Visit's Progress     Transition of Care       Current Barriers:  Knowledge Deficits related to plan of care for management of COPD and Tobacco Use   RNCM Clinical Goal(s):  Patient will work with the Care Management team over the next 30 days to address Transition of Care Barriers: Medication access Medication Management Support at home Cutting down Tobacco usage  through collaboration with RN Care manager, provider, and care team. 12/10 tele-visit Update - patient states he has cut down to 6 cigarettes per day and doesn't smoke the first thing getting up in the morning or late at night Interventions: Evaluation of current treatment plan related to  self management and patient's adherence to plan as established by provider   COPD Interventions:  (Status:  Ongoing) Short Term Goal Advised patient to track and manage COPD triggers Advised patient to self assesses COPD action plan zone and make appointment with provider if in the yellow zone for 48 hours without improvement Advised patient to engage in light exercise as tolerated 3-5 days a week to aid in the the management of COPD Provided education about and advised patient to utilize infection prevention strategies to reduce risk of respiratory infection Discussed the importance of adequate rest and management of fatigue with COPD Discussed Pulmonary Rehab and offered to assist with referral placement Assessed social determinant of health barriers - no interventions needed   Smoking Cessation Interventions:  (Status:  On Hold pending patient's choice to move forward) Long Term Goal 12/10 Update - has cut down his smoking to 6 cigarettes per day Reviewed smoking history:  tobacco abuse of 45 years; currently smoking  1 ppd Previous quit attempts, unknown  Reports triggers to smoke include: to be discussed when patient is ready  On a scale of 1-10, reports MOTIVATION to quit is unknown as of 12/3 On a scale of 1-10, reports CONFIDENCE in quitting is unknown  as of 12/3   Patient Goals/Self-Care Activities: Participate in Transition of Care Program/Attend TOC scheduled calls Take all medications as prescribed Attend all scheduled provider appointments Call pharmacy for medication refills 3-7 days in advance of running out of medications Perform all self care activities independently  Call provider office for new concerns or questions   Follow Up Plan:  The patient has been provided with contact information for the care management team and has been advised to call with any health related questions or concerns.          Plan: The patient has been provided with contact information for the care management team and has been advised to call with any health related questions or concerns.   Alyse Low, RN, BA, [facility], CRRN [facility] Coordinator, Transition of Care Ph # [phone]

## 2022-12-30 ENCOUNTER — Ambulatory Visit: Payer: Self-pay

## 2022-12-30 ENCOUNTER — Other Ambulatory Visit: Payer: Self-pay

## 2022-12-30 ENCOUNTER — Telehealth: Payer: Self-pay

## 2022-12-30 NOTE — Telephone Encounter (Signed)
  Chief Complaint: Medication clarification Symptoms: swollen feet Frequency: now Pertinent Negatives: Patient denies  Disposition: [] ED /[] Urgent Care (no appt availability in office) / [] Appointment(In office/virtual)/ []  [place] [facility]/ [] Home Care/ [] Refused Recommended Disposition /[] [place] [facility]/ [x]  Follow-up with PCP Additional Notes: Received call from [facility] Nurse case manager. She was calling to clarify instructions for lasix. Pt was d/c'd from hospital. He was instructed to take lasix 40 mg for 7 days by Dr. Dayna Barker.  12/16/2022. On 12/6 Laxis 40mg  was ordered by Cristino Martes with pt to take 1 daily.   Pt was unaware of this additional order/prescription. Please verify if pt is to continue lasix.  Pt has swollen feet, but no other s/s per Transport planner.  Additionally Elnita Maxwell wants pt to have an appt to review meds.  Please advise.     Reason for Disposition  [1] Caller has URGENT medicine question about med that PCP or specialist prescribed AND [2] triager unable to answer question  Answer Assessment - Initial Assessment Questions 1. NAME of MEDICINE: "What medicine(s) are you calling about?"     Lasix 40 mg 2. QUESTION: "What is your question?" (e.g., double dose of medicine, side effect)     Should pt be taking lasix? 3. PRESCRIBER: "Who prescribed the medicine?" Reason: if prescribed by specialist, call should be referred to that group.     Brandi Harris 4. SYMPTOMS: "Do you have any symptoms?" If Yes, ask: "What symptoms are you having?"  "How bad are the symptoms (e.g., mild, moderate, severe)     Swollen feet  Protocols used: Medication Question Call-A-AH

## 2022-12-30 NOTE — Telephone Encounter (Signed)
Call placed to patient unable to reach message left on VM.  Noted that patient was given OV appointment for 01/09/2023

## 2022-12-31 NOTE — Patient Outreach (Signed)
  Care Management  Transitions of Care Program Transitions of Care Post-discharge week 3, call 1 of 2    12/30/2022 Name: [name] MRN: [id number] DOB: 1956-01-09  Subjective: [name] is a 67 y.o. year old male who is a primary care patient of Hoy Register, MD. The Care Management team Engaged with patient Engaged with patient by telephone to assess and address transitions of care needs.   Consent to Services:  Patient was given information about care management services, agreed to services, and gave verbal consent to participate.   Assessment:     See 2nd call to patient on 12/30/22 for complete assessment      SDOH Interventions    Flowsheet Row ED to Hosp-Admission (Discharged) from 12/12/2022 in [facility] Telephone from 05/14/2022 in [facility] Coordination ED to Hosp-Admission (Discharged) from 05/10/2022 in [facility] Office Visit from 05/06/2022 in [facility] [place] [facility] [place] Telephone from 07/19/2021 in [facility]  SDOH Interventions       Food Insecurity Interventions -- Intervention Not Indicated -- -- Other (Comment)  Barrington Ellison SNAP]  Housing Interventions Intervention Not Indicated, Inpatient TOC -- Intervention Not Indicated, Inpatient TOC  [Patient currently has housing.] Intervention Not Indicated Intervention Not Indicated  Transportation Interventions -- Intervention Not Indicated -- Intervention Not Indicated Intervention Not Indicated  Utilities Interventions -- -- -- Intervention Not Indicated --  Alcohol Usage Interventions -- -- -- Intervention Not Indicated (Score <7) --  Financial Strain Interventions -- -- -- -- Other (Comment)  [CAFA/OC]  Physical Activity Interventions -- -- -- Intervention Not Indicated --  Stress Interventions -- -- -- Intervention Not Indicated --           Plan: The patient has been provided with contact information for the care management team and has been advised to call with any health related questions or concerns.   Alyse Low, RN, BA, [facility], CRRN [facility] Coordinator, Transition of Care Ph # [phone]

## 2022-12-31 NOTE — Patient Outreach (Signed)
Care Management  Transitions of Care Program Transitions of Care Post-discharge week 3   12/30/2022 Name: [name] MRN: [id number] DOB: [birth year]  Subjective: [name] is a 67 y.o. year old male who is a primary care patient of Hoy Register, MD. The Care Management team Engaged with patient Engaged with patient by telephone to assess and address transitions of care needs.   Consent to Services:  Patient was given information about care management services, agreed to services, and gave verbal consent to participate.   Assessment:           SDOH Interventions    Flowsheet Row Telephone from 12/30/2022 in [place] [facility] ED to Hosp-Admission (Discharged) from 12/12/2022 in [facility] [facility] Telephone from 05/14/2022 in [facility] ED to Hosp-Admission (Discharged) from 05/10/2022 in [place] [facility] Office Visit from 05/06/2022 in [facility] [place] [facility] [place] Telephone from 07/19/2021 in [facility]  SDOH Interventions        Food Insecurity Interventions -- -- Intervention Not Indicated -- -- Other (Comment)  [employer] SNAP]  Housing Interventions Intervention Not Indicated Intervention Not Indicated, Inpatient TOC -- Intervention Not Indicated, Inpatient TOC  [Patient currently has housing.] Intervention Not Indicated Intervention Not Indicated  Transportation Interventions -- -- Intervention Not Indicated -- Intervention Not Indicated Intervention Not Indicated  Utilities Interventions -- -- -- -- Intervention Not Indicated --  Alcohol Usage Interventions -- -- -- -- Intervention Not Indicated (Score <7) --  Financial Strain Interventions -- -- -- -- -- Other (Comment)  [CAFA/OC]  Physical Activity Interventions -- -- -- -- Intervention Not Indicated --  Stress Interventions  -- -- -- -- Intervention Not Indicated --        Goals Addressed             This Visit's Progress    Transition of Care       Current Barriers:  Knowledge Deficits related to plan of care for management of COPD and Tobacco Use   RNCM Clinical Goal(s):  Patient will work with the Care Management team over the next 30 days to address Transition of Care Barriers: Medication access Medication Management Support at home Cutting down Tobacco usage  through collaboration with RN Care manager, provider, and care team. 12/10 tele-visit Update - patient states he has cut down to 6 cigarettes per day and doesn't smoke the first thing getting up in the morning or late at night. 12/16 tele-visit Update - patient states he is smoking less than 0.3 ppd and his goal is to be smoking only 1 cigarette per day by new years 2025 Interventions: Evaluation of current treatment plan related to  self management and patient's adherence to plan as established by provider   COPD Interventions:  (Status:  Ongoing) Short Term Goal Advised patient to track and manage COPD triggers Advised patient to self assesses COPD action plan zone and make appointment with provider if in the yellow zone for 48 hours without improvement Advised patient to engage in light exercise as tolerated 3-5 days a week to aid in the the management of COPD Provided education about and advised patient to utilize infection prevention strategies to reduce risk of respiratory infection Discussed the importance of adequate rest and management of fatigue with COPD Discussed Pulmonary Rehab and offered to assist with referral placement Assessed social determinant of  health barriers - no interventions needed   Smoking Cessation Interventions:  (Status:  On Hold pending patient's choice to move forward) Long Term Goal 12/10 Update - has cut down his smoking to 6 cigarettes per day.12/16 tele-visit Update - patient states he is smoking less than  0.3 ppd and his goal is to be smoking only 1 cigarette per day by new years 2025 Reviewed smoking history:  tobacco abuse of 45 years; currently smoking 1 ppd Previous quit attempts, unknown  Reports triggers to smoke include: to be discussed when patient is ready  On a scale of 1-10, reports MOTIVATION to quit is unknown as of 12/3 On a scale of 1-10, reports CONFIDENCE in quitting is unknown as of 12/3   Patient Goals/Self-Care Activities: Participate in Transition of Care Program/Attend TOC scheduled calls Take all medications as prescribed Attend all scheduled provider appointments Call pharmacy for medication refills 3-7 days in advance of running out of medications Perform all self care activities independently  Call provider office for new concerns or questions  12/16 tele-visit Update - Found discrepancy with patient's understanding of taking Lasix (per DC Summary, Lasix 40mg  daily only continued for 7 days post discharge) - patient stated his feet are swollen again and when he takes his socks off, that there are deep indentations from the stockings, denies any increase in shortness of breath/states breathing same as upon discharge. Patient stated he is "no longer on Lasix"- patient was unaware has RX for Lasix 40mg  daily refilled by Lonell Face NP on 12/6 and prescriber's office was called to verify patient to continue taking Lasix 40mg  daily.  Follow Up Plan:  The patient has been provided with contact information for the care management team and has been advised to call with any health related questions or concerns.    Next/Final Transition of Care telephone appointment is 01/09/23 @ 1pm.        Plan: The patient has been provided with contact information for the care management team and has been advised to call with any health related questions or concerns.   Alyse Low, RN, BA, [facility], CRRN [facility] Coordinator, Transition of Care Ph #  [phone]

## 2022-12-31 NOTE — Patient Instructions (Signed)
Visit Information  Thank you for taking time to visit with me today. Please don't hesitate to contact me if I can be of assistance to you before our next scheduled telephone appointment.  Our next appointment is by telephone on 01/09/23 at 1pm  Following is a copy of your care plan:   Goals Addressed             This Visit's Progress    Transition of Care       Current Barriers:  Knowledge Deficits related to plan of care for management of COPD and Tobacco Use   RNCM Clinical Goal(s):  Patient will work with the Care Management team over the next 30 days to address Transition of Care Barriers: Medication access Medication Management Support at home Cutting down Tobacco usage  through collaboration with RN Care manager, provider, and care team. 12/10 tele-visit Update - patient states he has cut down to 6 cigarettes per day and doesn't smoke the first thing getting up in the morning or late at night. 12/16 tele-visit Update - patient states he is smoking less than 0.3 ppd and his goal is to be smoking only 1 cigarette per day by new years 2025 Interventions: Evaluation of current treatment plan related to  self management and patient's adherence to plan as established by provider   COPD Interventions:  (Status:  Ongoing) Short Term Goal Advised patient to track and manage COPD triggers Advised patient to self assesses COPD action plan zone and make appointment with provider if in the yellow zone for 48 hours without improvement Advised patient to engage in light exercise as tolerated 3-5 days a week to aid in the the management of COPD Provided education about and advised patient to utilize infection prevention strategies to reduce risk of respiratory infection Discussed the importance of adequate rest and management of fatigue with COPD Discussed Pulmonary Rehab and offered to assist with referral placement Assessed social determinant of health barriers - no interventions  needed   Smoking Cessation Interventions:  (Status:  On Hold pending patient's choice to move forward) Long Term Goal 12/10 Update - has cut down his smoking to 6 cigarettes per day.12/16 tele-visit Update - patient states he is smoking less than 0.3 ppd and his goal is to be smoking only 1 cigarette per day by new years 2025 Reviewed smoking history:  tobacco abuse of 45 years; currently smoking 1 ppd Previous quit attempts, unknown  Reports triggers to smoke include: to be discussed when patient is ready  On a scale of 1-10, reports MOTIVATION to quit is unknown as of 12/3 On a scale of 1-10, reports CONFIDENCE in quitting is unknown as of 12/3   Patient Goals/Self-Care Activities: Participate in Transition of Care Program/Attend TOC scheduled calls Take all medications as prescribed Attend all scheduled provider appointments Call pharmacy for medication refills 3-7 days in advance of running out of medications Perform all self care activities independently  Call provider office for new concerns or questions  12/16 tele-visit Update - Found discrepancy with patient's understanding of taking Lasix (per DC Summary, Lasix 40mg  daily only continued for 7 days post discharge) - patient stated his feet are swollen again and when he takes his socks off, that there are deep indentations from the stockings, denies any increase in shortness of breath/states breathing same as upon discharge. Patient stated he is "no longer on Lasix"- patient was unaware has RX for Lasix 40mg  daily refilled by Lonell Face NP on 12/6 and prescriber's  office was called to verify patient to continue taking Lasix 40mg  daily.  Follow Up Plan:  The patient has been provided with contact information for the care management team and has been advised to call with any health related questions or concerns.    Next/Final Transition of Care telephone appointment is 01/09/23 @ 1pm.        Patient verbalizes understanding of  instructions and care plan provided today and agrees to view in MyChart. Active MyChart status and patient understanding of how to access instructions and care plan via MyChart confirmed with patient.     The patient has been provided with contact information for the care management team and has been advised to call with any health related questions or concerns.   Please call the care guide team at [phone] if you need to cancel or reschedule your appointment.   Please call 1-800-273-TALK (toll free, 24 hour hotline) if you are experiencing a Mental Health or Behavioral Health Crisis or need someone to talk to.  Alyse Low, RN, BA, [facility], CRRN [facility] Coordinator, Transition of Care Ph # [phone]

## 2023-01-03 ENCOUNTER — Other Ambulatory Visit: Payer: Self-pay

## 2023-01-03 MED ORDER — ALBUTEROL SULFATE HFA 108 (90 BASE) MCG/ACT IN AERS
1.0000 | INHALATION_SPRAY | RESPIRATORY_TRACT | 3 refills | Status: DC
  Filled 2023-01-03: qty 18, 17d supply, fill #0
  Filled 2023-02-19: qty 18, 30d supply, fill #0
  Filled 2023-03-20: qty 18, 17d supply, fill #0
  Filled 2023-04-14: qty 18, 17d supply, fill #1
  Filled 2023-05-30: qty 18, 17d supply, fill #2
  Filled 2023-06-20: qty 18, 17d supply, fill #3

## 2023-01-03 MED ORDER — FUROSEMIDE 20 MG PO TABS
20.0000 mg | ORAL_TABLET | Freq: Every morning | ORAL | 3 refills | Status: DC
  Filled 2023-01-03: qty 90, 90d supply, fill #0

## 2023-01-09 ENCOUNTER — Other Ambulatory Visit: Payer: Self-pay

## 2023-01-09 NOTE — Patient Instructions (Signed)
Visit Information  Thank you for taking time to visit with me today. This was our final Transition of Care post-hospitalization program visit and it has been a true pleasure working with you during the past 4 weeks! You are doing an amazing job keeping yourself motivated and staying confident that you will limit your smoking to 1 cigarette per day in the [place] Year 2025. And, hopefully sometime next year, you will stop all together!   Please don't hesitate to contact me if I can be of assistance to you before our next scheduled telephone appointment.  Warm Regards,  Elnita Maxwell  Following is a copy of your care plan:   Goals Addressed             This Visit's Progress    COMPLETED: Transition of Care       Current Barriers:  Knowledge Deficits related to plan of care for management of COPD and Tobacco Use   RNCM Clinical Goal(s):  Patient will work with the Care Management team over the next 30 days to address Transition of Care Barriers: Medication access Medication Management Support at home Cutting down Tobacco usage  through collaboration with RN Care manager, provider, and care team. 12/10 tele-visit Update - patient states he has cut down to 6 cigarettes per day and doesn't smoke the first thing getting up in the morning or late at night. 12/16 & 12/26 tele-visit Update - patient states he is smoking less than 0.3 ppd and his goal is to be smoking only 1 cigarette per day by new years 2025 Interventions: Evaluation of current treatment plan related to  self management and patient's adherence to plan as established by provider   COPD Interventions:  (Status:  Ongoing) Short Term Goal Advised patient to track and manage COPD triggers Advised patient to self assesses COPD action plan zone and make appointment with provider if in the yellow zone for 48 hours without improvement Advised patient to engage in light exercise as tolerated 3-5 days a week to aid in the the management of  COPD Provided education about and advised patient to utilize infection prevention strategies to reduce risk of respiratory infection Discussed the importance of adequate rest and management of fatigue with COPD Discussed Pulmonary Rehab and offered to assist with referral placement Assessed social determinant of health barriers - no interventions needed   Smoking Cessation Interventions:  (Status:  Ongoing) Long Term Goal 12/10 Update - has cut down his smoking to 6 cigarettes per day.12/16 & 12/26 tele-visit Update - patient states he is smoking less than 0.3 ppd and his goal is to be smoking only 1 cigarette per day by new years 2025 Reviewed smoking history:  tobacco abuse of 45 years; currently smoking 1/2 to 1 ppd 12/26 down to 0.3 ppd Previous quit attempts, unknown  Reports triggers to smoke include: to be discussed when patient is ready  On a scale of 1-10, reports MOTIVATION to quit is unknown as of 12/26 = 10 On a scale of 1-10, reports CONFIDENCE in quitting is unknown as of 12/26 = 8   Patient Goals/Self-Care Activities: Participate in Transition of Care Program/Attend TOC scheduled calls Take all medications as prescribed Attend all scheduled provider appointments Call pharmacy for medication refills 3-7 days in advance of running out of medications Perform all self care activities independently  Call provider office for new concerns or questions  12/16 tele-visit Update - Found discrepancy with patient's understanding of taking Lasix (per DC Summary, Lasix 40mg  daily  only continued for 7 days post discharge) - patient stated his feet are swollen again and when he takes his socks off, that there are deep indentations from the stockings, denies any increase in shortness of breath/states breathing same as upon discharge. Patient stated he is "no longer on Lasix"- patient was unaware has RX for Lasix 40mg  daily refilled by Lonell Face NP on 12/6 and prescriber's office was  called to verify patient to continue taking Lasix 40mg  daily.  Follow Up Plan:  The patient has been provided with contact information for the care management team and has been advised to call with any health related questions or concerns.    Patient engaged in final Transition of Care telephone appointment today, 01/09/23 @ 1pm.        Patient verbalizes understanding of instructions and care plan provided today and agrees to view in MyChart. Active MyChart status and patient understanding of how to access instructions and care plan via MyChart confirmed with patient.     The patient has been provided with contact information for the care management team and has been advised to call with any health related questions or concerns.   Please call the care guide team at [phone] if you need to cancel or reschedule your appointment.   Please call 1-800-273-TALK (toll free, 24 hour hotline) if you are experiencing a Mental Health or Behavioral Health Crisis or need someone to talk to.  Alyse Low, RN, BA, [facility], CRRN [facility] Coordinator, Transition of Care Ph # [phone]

## 2023-01-09 NOTE — Patient Outreach (Signed)
Care Management  Transitions of Care Program Transitions of Care Post-discharge week 4   01/09/2023 Name: [name] MRN: [id number] DOB: 09-18-1955  Subjective: [name] is a 67 y.o. year old male who is a primary care patient of Hoy Register, MD. The Care Management team Engaged with patient Engaged with patient by telephone to assess and address transitions of care needs.   Consent to Services:  Patient was given information about care management services, agreed to services, and gave verbal consent to participate.   Assessment:           SDOH Interventions    Flowsheet Row Telephone from 12/30/2022 in [place] [facility] ED to Hosp-Admission (Discharged) from 12/12/2022 in [place] [facility] Telephone from 05/14/2022 in [facility] ED to Hosp-Admission (Discharged) from 05/10/2022 in [place] [facility] Office Visit from 05/06/2022 in [facility] [place] [facility] Of [place]. [facility] Telephone from 07/19/2021 in [facility]  SDOH Interventions        Food Insecurity Interventions -- -- Intervention Not Indicated -- -- Other (Comment)  [employer] SNAP]  Housing Interventions Intervention Not Indicated Intervention Not Indicated, Inpatient TOC -- Intervention Not Indicated, Inpatient TOC  [Patient currently has housing.] Intervention Not Indicated Intervention Not Indicated  Transportation Interventions -- -- Intervention Not Indicated -- Intervention Not Indicated Intervention Not Indicated  Utilities Interventions -- -- -- -- Intervention Not Indicated --  Alcohol Usage Interventions -- -- -- -- Intervention Not Indicated (Score <7) --  Financial Strain Interventions -- -- -- -- -- Other (Comment)  [CAFA/OC]  Physical Activity Interventions -- -- -- -- Intervention Not Indicated --  Stress Interventions  -- -- -- -- Intervention Not Indicated --        Goals Addressed             This Visit's Progress    COMPLETED: Transition of Care       Current Barriers:  Knowledge Deficits related to plan of care for management of COPD and Tobacco Use   RNCM Clinical Goal(s):  Patient will work with the Care Management team over the next 30 days to address Transition of Care Barriers: Medication access Medication Management Support at home Cutting down Tobacco usage  through collaboration with RN Care manager, provider, and care team. 12/10 tele-visit Update - patient states he has cut down to 6 cigarettes per day and doesn't smoke the first thing getting up in the morning or late at night. 12/16 & 12/26 tele-visit Update - patient states he is smoking less than 0.3 ppd and his goal is to be smoking only 1 cigarette per day by new years 2025 Interventions: Evaluation of current treatment plan related to  self management and patient's adherence to plan as established by provider   COPD Interventions:  (Status:  Ongoing) Short Term Goal Advised patient to track and manage COPD triggers Advised patient to self assesses COPD action plan zone and make appointment with provider if in the yellow zone for 48 hours without improvement Advised patient to engage in light exercise as tolerated 3-5 days a week to aid in the the management of COPD Provided education about and advised patient to utilize infection prevention strategies to reduce risk of respiratory infection Discussed the importance of adequate rest and management of fatigue with COPD Discussed Pulmonary Rehab and offered to assist with referral placement Assessed  social determinant of health barriers - no interventions needed   Smoking Cessation Interventions:  (Status:  Ongoing) Long Term Goal 12/10 Update - has cut down his smoking to 6 cigarettes per day.12/16 & 12/26 tele-visit Update - patient states he is smoking less than 0.3 ppd and  his goal is to be smoking only 1 cigarette per day by new years 2025 Reviewed smoking history:  tobacco abuse of 45 years; currently smoking 1/2 to 1 ppd 12/26 down to 0.3 ppd Previous quit attempts, unknown  Reports triggers to smoke include: to be discussed when patient is ready  On a scale of 1-10, reports MOTIVATION to quit is unknown as of 12/26 = 10 On a scale of 1-10, reports CONFIDENCE in quitting is unknown as of 12/26 = 8   Patient Goals/Self-Care Activities: Participate in Transition of Care Program/Attend TOC scheduled calls Take all medications as prescribed Attend all scheduled provider appointments Call pharmacy for medication refills 3-7 days in advance of running out of medications Perform all self care activities independently  Call provider office for new concerns or questions  12/16 tele-visit Update - Found discrepancy with patient's understanding of taking Lasix (per DC Summary, Lasix 40mg  daily only continued for 7 days post discharge) - patient stated his feet are swollen again and when he takes his socks off, that there are deep indentations from the stockings, denies any increase in shortness of breath/states breathing same as upon discharge. Patient stated he is "no longer on Lasix"- patient was unaware has RX for Lasix 40mg  daily refilled by Lonell Face NP on 12/6 and prescriber's office was called to verify patient to continue taking Lasix 40mg  daily.  Follow Up Plan:  The patient has been provided with contact information for the care management team and has been advised to call with any health related questions or concerns.    Patient engaged in final Transition of Care telephone appointment today, 01/09/23 @ 1pm.        Plan: The patient has been provided with contact information for the care management team and has been advised to call with any health related questions or concerns.   Alyse Low, RN, BA, [facility], CRRN [facility] Coordinator, Transition of Care Ph # [phone]

## 2023-01-14 ENCOUNTER — Other Ambulatory Visit: Payer: Self-pay

## 2023-01-16 ENCOUNTER — Other Ambulatory Visit: Payer: Self-pay

## 2023-02-11 ENCOUNTER — Ambulatory Visit: Payer: Self-pay

## 2023-02-11 NOTE — Patient Outreach (Signed)
  Care Coordination   Follow Up Visit Note   02/11/2023 Name: [name] MRN: [id number] DOB: 24-Mar-1955  Cathie Olden Dengel is a 68 y.o. year old male who sees Hoy Register, MD for primary care. I spoke with  Vilinda Blanks by phone today.  What matters to the patients health and wellness today?  Patient would like to f/u with his lung specialist.     Goals Addressed             This Visit's Progress    COMPLETED: To follow up with General Surgery for evaluation of neck       Care Coordination Interventions: Evaluation of current treatment plan related to lipoma and patient's adherence to plan as established by provider Goal not addressed with this encounter Counseled patient on the importance of keeping all scheduled follow up appointments with his health care providers Informed patient of nurse care coordination closure due to patient transitioning over to [facility] for primary care needs Sent Dr. Alvis Lemmings an in basket message to make her aware      COMPLETED: To follow up with Pulmonology for evaluation of COPD       Care Coordination Interventions: Evaluation of current treatment plan related to COPD and patient's adherence to plan as established by provider  Noted patent was admitted to [facility], 12/12/22-12/16/22; dx: COPD exacerbation with acute repiratory failure with hypoxia Provided patient with basic verbal COPD education on self care/management/and exacerbation prevention Advised patient to track and manage COPD triggers Advised patient to self assesses COPD action plan zone and make appointment with provider if in the yellow zone for 48 hours without improvement Provided education about and advised patient to utilize infection prevention strategies to reduce risk of respiratory infection  Determined patient has transitioned over to [facility] for his primary care, he has completed a post hospital follow up with this  provider Instructed patient to contact his Pulmonologist to schedule a follow up appointment Counseled patient on the importance of keeping all scheduled follow up appointments with his health care providers Informed patient of nurse care coordination closure due to patient transitioning over to [facility] for primary care needs Sent Dr. Alvis Lemmings an in basket message to make her aware     Interventions Today    Flowsheet Row Most Recent Value  Chronic Disease   Chronic disease during today's visit Chronic Obstructive Pulmonary Disease (COPD)  General Interventions   General Interventions Discussed/Reviewed General Interventions Discussed, General Interventions Reviewed, Doctor Visits, Labs  Doctor Visits Discussed/Reviewed Doctor Visits Discussed, Doctor Visits Reviewed, Specialist, PCP  Education Interventions   Education Provided Provided Education  Provided Verbal Education On Labs, When to see the doctor, Medication, Sick Day Rules  Pharmacy Interventions   Pharmacy Dicussed/Reviewed Pharmacy Topics Discussed, Pharmacy Topics Reviewed          SDOH assessments and interventions completed:  No     Care Coordination Interventions:  Yes, provided   Follow up plan: No further intervention required.   Encounter Outcome:  Patient Visit Completed

## 2023-02-11 NOTE — Patient Instructions (Signed)
Visit Information  Thank you for taking time to visit with me today. Please don't hesitate to contact me if I can be of assistance to you.   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 Alvis Lemmings an in basket message to make her aware      COMPLETED: To follow up with Pulmonology for evaluation of COPD       Care Coordination Interventions: Evaluation of current treatment plan related to COPD and patient's adherence to plan as established by provider  Noted patent was admitted to [facility], 12/12/22-12/16/22; dx: COPD exacerbation with acute repiratory failure with hypoxia Provided patient with basic verbal COPD education on self care/management/and exacerbation prevention Advised patient to track and manage COPD triggers Advised patient to self assesses COPD action plan zone and make appointment with provider if in the yellow zone for 48 hours without improvement Provided education about and advised patient to utilize infection prevention strategies to reduce risk of respiratory infection  Determined patient has transitioned over to [facility] for his primary care, he has completed a post hospital follow up with this provider Instructed patient to contact his Pulmonologist to schedule a follow up appointment Counseled patient on the importance of keeping all scheduled follow up appointments with his health care  providers Informed patient of nurse care coordination closure due to patient transitioning over to [facility] for primary care needs Sent Dr. Alvis Lemmings an in basket message to make her aware        If you are experiencing a Mental Health or Behavioral Health Crisis or need someone to talk to, please call 1-800-273-TALK (toll free, 24 hour hotline)  The patient verbalized understanding of instructions, educational materials, and care plan provided today and DECLINED offer to receive copy of patient instructions, educational materials, and care plan.   Delsa Sale RN BSN CCM [place]  [facility], [facility] Nurse Care Coordinator  Direct Dial: [phone] Website: [email or web address]

## 2023-02-19 ENCOUNTER — Other Ambulatory Visit: Payer: Self-pay

## 2023-02-21 ENCOUNTER — Other Ambulatory Visit: Payer: Self-pay

## 2023-03-13 ENCOUNTER — Other Ambulatory Visit: Payer: Self-pay

## 2023-03-14 ENCOUNTER — Other Ambulatory Visit: Payer: Self-pay

## 2023-03-20 ENCOUNTER — Other Ambulatory Visit: Payer: Self-pay

## 2023-04-14 ENCOUNTER — Other Ambulatory Visit: Payer: Self-pay

## 2023-04-16 ENCOUNTER — Telehealth: Payer: Self-pay | Admitting: Acute Care

## 2023-04-16 DIAGNOSIS — Z87891 Personal history of nicotine dependence: Secondary | ICD-10-CM

## 2023-04-16 DIAGNOSIS — F17210 Nicotine dependence, cigarettes, uncomplicated: Secondary | ICD-10-CM

## 2023-04-16 DIAGNOSIS — Z122 Encounter for screening for malignant neoplasm of respiratory organs: Secondary | ICD-10-CM

## 2023-04-16 NOTE — Telephone Encounter (Signed)
 Lung Cancer Screening Narrative/Criteria Questionnaire (Cigarette Smokers Only- No Cigars/Pipes/vapes)   [name]   SDMV:05/19/2023 at 10:45 with Orpha Bur       1955-07-04   LDCT: 06/02/2023 at 9:00am at [facility]    68 y.o.   Phone: [phone]  Lung Screening Narrative (confirm age 73-77 yrs Medicare / 50-80 yrs Private pay insurance)   Insurance information:[employer] mcr   Referring Provider:Dr. [place]    This screening involves an initial phone call with a team member from our program. It is called a shared decision making visit. The initial meeting is required by  insurance and Medicare to make sure you understand the program. This appointment takes about 15-20 minutes to complete. You will complete the screening scan at your scheduled date/time.  This scan takes about 5-10 minutes to complete. You can eat and drink normally before and after the scan.  Criteria questions for Lung Cancer Screening:   Are you a current or former smoker? Current Age began smoking: 68yo   If you are a former smoker, what year did you quit smoking? N/A(within 15 yrs)   To calculate your smoking history, I need an accurate estimate of how many packs of cigarettes you smoked per day and for how many years. (Not just the number of PPD you are now smoking)   Years smoking 51 x Packs per day 1 = Pack years 51   (at least 20 pack yrs)   (Make sure they understand that we need to know how much they have smoked in the past, not just the number of PPD they are smoking now)  Do you have a personal history of cancer?  No    Do you have a family history of cancer? Grandmother - pt unsure of type  Are you coughing up blood?  No  Have you had unexplained weight loss of 15 lbs or more in the last 6 months? No  It looks like you meet all criteria.  When would be a good time for [place] to schedule you for this screening?   Additional information: N/A

## 2023-04-21 ENCOUNTER — Other Ambulatory Visit: Payer: Self-pay

## 2023-04-21 ENCOUNTER — Encounter: Payer: Self-pay | Admitting: Pharmacist

## 2023-04-21 ENCOUNTER — Other Ambulatory Visit (HOSPITAL_BASED_OUTPATIENT_CLINIC_OR_DEPARTMENT_OTHER): Payer: Self-pay | Admitting: Pharmacist

## 2023-04-21 DIAGNOSIS — Z91199 Patient's noncompliance with other medical treatment and regimen due to unspecified reason: Secondary | ICD-10-CM

## 2023-04-21 NOTE — Progress Notes (Signed)
 Patient appeared on insurance report for not passing the quality metrics in 2024: Medication Adherence for Cholesterol (MAC) Medication Adherence for Hypertension [facility])   Outreach to the patient was Successful. Communicated with our pharmacy to process refills for him.   Butch Penny, PharmD, P[place]y [place], CPP Clinical Pharmacist [facility] [phone]

## 2023-04-28 ENCOUNTER — Other Ambulatory Visit: Payer: Self-pay

## 2023-04-30 ENCOUNTER — Other Ambulatory Visit: Payer: Self-pay | Admitting: Pharmacist

## 2023-04-30 ENCOUNTER — Other Ambulatory Visit: Payer: Self-pay

## 2023-04-30 MED ORDER — LOSARTAN POTASSIUM 25 MG PO TABS
25.0000 mg | ORAL_TABLET | Freq: Every day | ORAL | 0 refills | Status: DC
  Filled 2023-04-30 – 2023-05-30 (×2): qty 30, 30d supply, fill #0

## 2023-04-30 MED ORDER — ATORVASTATIN CALCIUM 40 MG PO TABS
40.0000 mg | ORAL_TABLET | Freq: Every day | ORAL | 0 refills | Status: DC
  Filled 2023-04-30 – 2023-05-30 (×2): qty 30, 30d supply, fill #0

## 2023-05-19 ENCOUNTER — Ambulatory Visit: Admitting: Adult Health

## 2023-05-19 ENCOUNTER — Encounter: Payer: Self-pay | Admitting: Adult Health

## 2023-05-19 DIAGNOSIS — F17210 Nicotine dependence, cigarettes, uncomplicated: Secondary | ICD-10-CM | POA: Diagnosis not present

## 2023-05-19 NOTE — Patient Instructions (Signed)

## 2023-05-19 NOTE — Progress Notes (Signed)
  Virtual Visit via Telephone Note  I connected with [name] , 05/19/23 10:50 AM by a telemedicine application and verified that I am speaking with the correct person using two identifiers.  Location: Patient: home Provider: home   I discussed the limitations of evaluation and management by telemedicine and the availability of in person appointments. The patient expressed understanding and agreed to proceed.   Shared Decision Making Visit Lung Cancer Screening Program [phone])   Eligibility: 68 y.o. Pack Years Smoking History Calculation = 51 pack years  (# packs/per year x # years smoked) Recent History of coughing up blood  no Unexplained weight loss? no ( >Than 15 pounds within the last 6 months ) Prior History Lung / other cancer no (Diagnosis within the last 5 years already requiring surveillance chest CT Scans). Smoking Status Current Smoker   Visit Components: Discussion included one or more decision making aids. YES Discussion included risk/benefits of screening. YES Discussion included potential follow up diagnostic testing for abnormal scans. YES Discussion included meaning and risk of over diagnosis. YES Discussion included meaning and risk of False Positives. YES Discussion included meaning of total radiation exposure. YES  Counseling Included: Importance of adherence to annual lung cancer LDCT screening. YES Impact of comorbidities on ability to participate in the program. YES Ability and willingness to under diagnostic treatment. YES  Smoking Cessation Counseling: Current Smokers:  Discussed importance of smoking cessation. yes Information about tobacco cessation classes and interventions provided to patient. yes Patient provided with "ticket" for LDCT Scan. yes Symptomatic Patient. NO Diagnosis Code: Tobacco Use Z72.0 Asymptomatic Patient yes  Counseling - 4 minutes of smoking cessation counseling (CT Chest Lung Cancer Screening Low Dose W/O  CM) [id number]  Z12.2-Screening of respiratory organs Z87.891-Personal history of nicotine  dependence   [place]ullen Dose 05/19/23

## 2023-05-29 ENCOUNTER — Encounter: Payer: Self-pay | Admitting: Acute Care

## 2023-05-30 ENCOUNTER — Other Ambulatory Visit: Payer: Self-pay

## 2023-06-02 ENCOUNTER — Other Ambulatory Visit: Payer: Self-pay

## 2023-06-02 ENCOUNTER — Ambulatory Visit
Admission: RE | Admit: 2023-06-02 | Discharge: 2023-06-02 | Disposition: A | Discharge status: Home / Self Care | Source: Ambulatory Visit | Attending: Acute Care | Admitting: Acute Care

## 2023-06-02 DIAGNOSIS — F17210 Nicotine dependence, cigarettes, uncomplicated: Secondary | ICD-10-CM

## 2023-06-02 DIAGNOSIS — Z122 Encounter for screening for malignant neoplasm of respiratory organs: Secondary | ICD-10-CM

## 2023-06-02 DIAGNOSIS — Z87891 Personal history of nicotine dependence: Secondary | ICD-10-CM

## 2023-06-11 ENCOUNTER — Other Ambulatory Visit: Payer: Self-pay

## 2023-06-20 ENCOUNTER — Other Ambulatory Visit: Payer: Self-pay

## 2023-06-23 ENCOUNTER — Telehealth: Payer: Self-pay | Admitting: Acute Care

## 2023-06-23 ENCOUNTER — Telehealth: Payer: Self-pay

## 2023-06-23 ENCOUNTER — Other Ambulatory Visit: Payer: Self-pay

## 2023-06-23 NOTE — Telephone Encounter (Signed)
 Call Report from Tiffany  IMPRESSION: 1. Lung-RADS 4B, suspicious. Additional imaging evaluation or consultation with Pulmonology or Thoracic Surgery recommended. Interval development of multiple new pulmonary nodules, nodular consolidative opacities, and sub solid pulmonary lesions in both lungs. Dominant solid opacities in the left posterior costophrenic sulcus measuring 17.5 mm. These may well be infectious/inflammatory but warrants close follow-up. 2. Diffuse bronchial wall thickening with scattered areas of mild cylindrical bronchiectasis, lower lung predominant. Imaging features suggest chronic bronchitis. 3. Aortic Atherosclerosis (ICD10-I70.0) and Emphysema (ICD10-J43.9).

## 2023-06-23 NOTE — Telephone Encounter (Signed)
 See provider notes from 06/23/2023

## 2023-06-23 NOTE — Telephone Encounter (Signed)
 Appt scheduled for 07/15/2023 at 9:30 am. Consult paperwork mailed to patient with appt reminder. Results and plan faxed to PCP.

## 2023-06-23 NOTE — Telephone Encounter (Signed)
 I have called the patient with the results of his low dose Ct /chest. His scam was read as a LR 4 B. He has had Interval development of multiple new pulmonary nodules, nodular consolidative opacities, and sub solid pulmonary lesions in both lungs. Dominant solid opacities in the left posterior costophrenic sulcus measuring 17.5 mm. These may well be infectious/inflammatory but warrants close follow-up.  He states he was not sick at the time of the scan which was done 06/02/2023 and read 06/21/2023. He endorses mostly creamy white secretions , no green, and no weight loss or hemoptysis. He denies any fever.   Plan will be for OV for consult for pulmonary nodule. He states he cannot be seen until after 07/09/2023.   Ladies, can we get patient scheduled for after 07/09/2023 with me or Dr. Baldwin Levee for lung nodule consult. Please send him a consult worksheet to complete before the visit. Thanks so much.  Please fax results to PCP and let them know plan moving forward.  Thanks so much

## 2023-06-25 ENCOUNTER — Telehealth: Payer: Self-pay | Admitting: Acute Care

## 2023-06-25 ENCOUNTER — Other Ambulatory Visit: Payer: Self-pay

## 2023-06-25 NOTE — Telephone Encounter (Signed)
 Patient LVM and request provider call back. Provider notified.

## 2023-06-25 NOTE — Telephone Encounter (Signed)
 I have attempted to call the patient with the results of their  Low Dose CT Chest Lung cancer screening scan. There was no answer. I have left a HIPPA compliant VM requesting the patient call the office for the scan results. I included the office contact information in the message. We will await his return call. If no return call we will continue to call until patient is contacted.    He needs a 1 month follow up scan and then if still changing at that point a bronch.

## 2023-06-25 NOTE — Telephone Encounter (Signed)
 Pt. Is scheduled 7/1 ar 9:30 with me. We will treat with antibiotics and repeat the scan 1 month after to reassess the nodule of concern for stability, growth or resolution. Pt. Verbalized understanding

## 2023-07-15 ENCOUNTER — Other Ambulatory Visit: Payer: Self-pay | Admitting: Emergency Medicine

## 2023-07-15 ENCOUNTER — Encounter: Payer: Self-pay | Admitting: Acute Care

## 2023-07-15 ENCOUNTER — Ambulatory Visit (INDEPENDENT_AMBULATORY_CARE_PROVIDER_SITE_OTHER): Admitting: Acute Care

## 2023-07-15 ENCOUNTER — Other Ambulatory Visit: Payer: Self-pay

## 2023-07-15 VITALS — BP 164/75 | HR 92 | Ht 69.0 in | Wt 224.4 lb

## 2023-07-15 DIAGNOSIS — R911 Solitary pulmonary nodule: Secondary | ICD-10-CM

## 2023-07-15 DIAGNOSIS — F17210 Nicotine dependence, cigarettes, uncomplicated: Secondary | ICD-10-CM

## 2023-07-15 DIAGNOSIS — J069 Acute upper respiratory infection, unspecified: Secondary | ICD-10-CM

## 2023-07-15 DIAGNOSIS — R918 Other nonspecific abnormal finding of lung field: Secondary | ICD-10-CM | POA: Diagnosis not present
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
## 2023-07-15 MED ORDER — DOXYCYCLINE HYCLATE 100 MG PO TABS
100.0000 mg | ORAL_TABLET | Freq: Two times a day (BID) | ORAL | 0 refills | Status: DC
  Filled 2023-07-15: qty 14, 7d supply, fill #0

## 2023-07-15 NOTE — Progress Notes (Signed)
 History of Present Illness [name] is a 68 y.o. male current every day smoker followed by Dr. Kara and the lung cancer screening program. He has been referred for an abnormal lung cancer screening scan.   07/15/2023 Pt. Presents after an abnormal lung cancer screening scan. He had a scan done 06/02/2023 ( read 06/21/2023) that was read as a 4 B. Per radiology, they feel there is a chance this is infectious or inflammatory. He does not recall being sick when he had the scan done. He has not had weight loss or any change in his breathing. He denies hemoptysis. We will treat with Doxycycline  ( he has a penicillin  allergy) and repeat the scan in 1 month to re-evaluate the nodule. If there is not significant change, we will move forward with PET scan to better evaluate for malignancy. Pt. Is in agreement with this plan.    Test Results: LDCT 06/02/2023 Centrilobular and paraseptal emphysema evident. Diffuse bronchial wall thickening evident with scattered areas of mild cylindrical bronchiectasis, lower lung predominant. Interval development of multiple new pulmonary nodules, nodular consolidative opacities, and sub solid pulmonary lesions in both lungs. Dominant solid opacities in the left posterior costophrenic sulcus on image 268 measuring 17.5 mm there is a new 9.1 mm bilobed pulmonary nodule in the medial right upper lobe on image 71. Additional scattered 4-8 mm pulmonary nodules are noted bilaterally including a 5.8 mm new left upper lobe nodule on image 159. No pleural effusion.      Latest Ref Rng & Units 12/14/2022    5:49 AM 12/13/2022    5:45 AM 12/12/2022   10:03 PM  CBC  WBC 4.0 - 10.5 K/uL 10.2  4.0  6.0   Hemoglobin 13.0 - 17.0 g/dL 85.5  84.2  84.7   Hematocrit 39.0 - 52.0 % 44.8  47.6  44.2   Platelets 150 - 400 K/uL 159  150  151        Latest Ref Rng & Units 12/14/2022    5:49 AM 12/13/2022    5:45 AM 12/12/2022   10:03 PM  BMP  Glucose 70 - 99 mg/dL 854   795  888   BUN 8 - 23 mg/dL 16  12  13    Creatinine 0.61 - 1.24 mg/dL 9.13  9.18  9.08   Sodium 135 - 145 mmol/L 140  137  138   Potassium 3.5 - 5.1 mmol/L 4.8  4.0  3.4   Chloride 98 - 111 mmol/L 101  99  101   CO2 22 - 32 mmol/L 33  28  27   Calcium  8.9 - 10.3 mg/dL 8.8  8.6  8.8     BNP    Component Value Date/Time   BNP 34.0 12/12/2022 2203    ProBNP No results found for: PROBNP  PFT No results found for: FEV1PRE, FEV1POST, FVCPRE, FVCPOST, TLC, DLCOUNC, PREFEV1FVCRT, PSTFEV1FVCRT  No results found.   Past medical hx Past Medical History:  Diagnosis Date   Asthma    COPD (chronic obstructive pulmonary disease) (HCC)    Hx of adenomatous polyp of colon 01/13/2020   Hypertension      Social History   Tobacco Use   Smoking status: Every Day    Current packs/day: 0.30    Average packs/day: 1 pack/day for 45.6 years (45.2 ttl pk-yrs)    Types: Cigarettes    Start date: 12/2022   Smokeless tobacco: Never   Tobacco comments:    5 cigarettes a  day,slightly dependent on stress increases usage on stress  07/15/2023 KRD  Vaping Use   Vaping status: Never Used  Substance Use Topics   Alcohol use: Yes    Alcohol/week: 1.0 standard drink of alcohol    Types: 1 Standard drinks or equivalent per week    Comment: every once in awhile-3 times a month per pt   Drug use: Yes    Frequency: 1.0 times per week    Types: Marijuana    Comment: occasionally marijuana-3 times a month per pt    [name] reports that he has been smoking cigarettes. He started smoking about 6 months ago. He has a 45.2 pack-year smoking history. He has never used smokeless tobacco. He reports current alcohol use of about 1.0 standard drink of alcohol per week. He reports current drug use. Frequency: 1.00 time per week. Drug: Marijuana.  Tobacco Cessation: Ready to quit: Not Answered Counseling given: Not Answered Tobacco comments: 5 cigarettes a day,slightly dependent on stress  increases usage on stress  07/15/2023 KRD 47 pack year smoking history, current every day smoker   Pt. Has been counseled to quit smoking x 3-4 minutes today.   Past surgical hx, Family hx, Social hx all reviewed.  Current Outpatient Medications on File Prior to Visit  Medication Sig   albuterol  (VENTOLIN  HFA) 108 (90 Base) MCG/ACT inhaler Inhale 2 puffs into the lungs every 6 (six) hours as needed for wheezing or shortness of breath.   aspirin  EC 81 MG tablet Take 1 tablet by mouth in the morning daily.   atorvastatin  (LIPITOR) 40 MG tablet Take 1 tablet (40 mg total) by mouth daily. Please schedule PCP appt for more refills.   Budeson-Glycopyrrol-Formoterol  (BREZTRI  AEROSPHERE) 160-9-4.8 MCG/ACT AERO Inhale 2 puffs into the lungs 2 (two) times daily.   budesonide -glycopyrrolate -formoterol  (BREZTRI  AEROSPHERE) 160-9-4.8 MCG/ACT AERO inhaler Take 2 puffs by mouth in the morning AND 2 puffs every evening.   ergocalciferol  (VITAMIN D2) 1.25 MG (50000 UT) capsule Take 1 capsule by mouth once a week for 12 weeks.   furosemide  (LASIX ) 20 MG tablet Take 1 tablet (20 mg total) by mouth in the morning for high blood pressure and swelling.   furosemide  (LASIX ) 40 MG tablet Take 1 tablet (40 mg total) by mouth daily for 7 days.   ipratropium-albuterol  (DUONEB) 0.5-2.5 (3) MG/3ML SOLN Take 3 mLs by nebulization every 6 (six) hours as needed (severe wheezing).   losartan  (COZAAR ) 25 MG tablet Take 1 tablet (25 mg total) by mouth daily. Please schedule PCP appt for more refills.   Misc. Devices MISC Nebulizer device.  Diagnosis COPD   predniSONE  (DELTASONE ) 10 MG tablet Take 4 tablets by mouth daily x 3 days THEN take 3 tablets daily x 3 days THEN take 2 tablets daily x 3 days THEN take 1 tablet daily x 2 days   albuterol  (VENTOLIN  HFA) 108 (90 Base) MCG/ACT inhaler Inhale 1-2 puffs into the lungs every 4 to 6 hours as needed for shortness of breath and wheezing (Patient not taking: Reported on 07/15/2023)    furosemide  (LASIX ) 40 MG tablet Take 1 pill by mouth once daily. (Patient not taking: Reported on 07/15/2023)   No current facility-administered medications on file prior to visit.     Allergies  Allergen Reactions   Codeine Itching   Penicillins Itching and Other (See Comments)    Review Of Systems:  Constitutional:   No  weight loss, night sweats,  Fevers, chills, fatigue, or  lassitude.  HEENT:  No headaches,  Difficulty swallowing,  Tooth/dental problems, or  Sore throat,                No sneezing, itching, ear ache, nasal congestion, post nasal drip,   CV:  No chest pain,  Orthopnea, PND, swelling in lower extremities, anasarca, dizziness, palpitations, syncope.   GI  No heartburn, indigestion, abdominal pain, nausea, vomiting, diarrhea, change in bowel habits, loss of appetite, bloody stools.   Resp: No shortness of breath with exertion or at rest.  No excess mucus, no productive cough,  No non-productive cough,  No coughing up of blood.  No change in color of mucus.  No wheezing.  No chest wall deformity  Skin: no rash or lesions.  GU: no dysuria, change in color of urine, no urgency or frequency.  No flank pain, no hematuria   MS:  No joint pain or swelling.  No decreased range of motion.  No back pain.  Psych:  No change in mood or affect. No depression or anxiety.  No memory loss.   Vital Signs BP (!) 164/75 (BP Location: Right Arm, Patient Position: Sitting, Cuff Size: Large)   Pulse 92   Ht 5' 9 (1.753 m)   Wt 224 lb 6.4 oz (101.8 kg)   SpO2 93%   BMI 33.14 kg/m    Physical Exam:  General- No distress,  A&Ox3, plkeasant ENT: No sinus tenderness, TM clear, pale nasal mucosa, no oral exudate,no post nasal drip, no LAN Cardiac: S1, S2, regular rate and rhythm, no murmur Chest: No wheeze/ rales/ dullness; no accessory muscle use, no nasal flaring, no sternal retractions Abd.: Soft Non-tender, ND, BS +, Body mass index is 33.14 kg/m.  Ext: No clubbing  cyanosis, edema, no obvious deformities Neuro:  normal strength, MAE x 4, A&O x 3, appropriate Skin: No rashes, warm and dry, no obvious skin lesions  Psych: normal mood and behavior   Assessment/Plan Abnormal lung cancer Screening scan >> 4 B New Pulmonary Nodules Current every day smoker  Plan Your lung cancer screening CT scan was abnormal. We will treat you with an antibiotic called Doxycycline  . Take one tablet in the morning and one in the evening x 7 days. Wear sun block if you are outside . We will do a repeat CT scan in the third week of July to reassess the nodules of concern.  You will get a call to get this scheduled closer to the time. You will follow up with me within 1 week of the scan to review the results. If the nodules have not resolved we will need to further evaluate them at that point. Call if you need us  sooner.  Please contact office for sooner follow up if symptoms do not improve or worsen or seek emergency care    I spent 20 minutes dedicated to the care of this patient on the date of this encounter to include pre-visit review of records, face-to-face time with the patient discussing conditions above, post visit ordering of testing, clinical documentation with the electronic health record, making appropriate referrals as documented, and communicating necessary information to the patient's healthcare team.     Lauraine JULIANNA Lites, NP 07/15/2023  9:33 AM

## 2023-07-15 NOTE — Patient Instructions (Signed)
 It is good to see you today. Your lung cancer screening CT scan was abnormal. We will treat you with an antibiotic called Doxycycline  . Take one tablet in the morning and one in the evening x 7 days. Wear sun block if you are outside . We will do a repeat CT scan in the third week of July to reassess the nodules of concern.  You will get a call to get this scheduled closer to the time. You will follow up with me within 1 week of the scan to review the results. If the nodules have not resolved we will need to further evaluate them at that point. Call if you need us  sooner.  Please contact office for sooner follow up if symptoms do not improve or worsen or seek emergency care

## 2023-07-16 ENCOUNTER — Other Ambulatory Visit: Payer: Self-pay

## 2023-08-07 ENCOUNTER — Inpatient Hospital Stay
Admission: RE | Admit: 2023-08-07 | Discharge: 2023-08-07 | Disposition: A | Discharge status: Home / Self Care | Source: Ambulatory Visit | Attending: Acute Care | Admitting: Acute Care

## 2023-08-07 DIAGNOSIS — R911 Solitary pulmonary nodule: Secondary | ICD-10-CM

## 2023-08-14 ENCOUNTER — Telehealth: Payer: Self-pay

## 2023-08-14 NOTE — Telephone Encounter (Signed)
 Called patient to reschedule appointment due to ct not being read and unable to get those escalated will call when it is read,patient verbalized understanding will cancel appointment

## 2023-08-15 ENCOUNTER — Ambulatory Visit: Admitting: Acute Care

## 2023-08-15 ENCOUNTER — Telehealth: Payer: Self-pay | Admitting: Acute Care

## 2023-08-15 ENCOUNTER — Telehealth: Payer: Self-pay

## 2023-08-15 NOTE — Telephone Encounter (Signed)
 Called and spoke to pt. Patient states he has not been sick but has been running his AC more which causes him to have an increase in productive cough with clear mucus. Patient also states the heat recently causes his ShOB and has used 3 rescue inhalers within the last month. Patient states he is aware this is over usage of the medication and should not happen. When the pt was last seen on 07/15/2023 he was scripted Doxy 100mg  BID x 7 days. Patient states this really helped his chest tightness and ShOB.

## 2023-08-15 NOTE — Telephone Encounter (Signed)
 Can we call patent and see if he has been sick. These seen to be waxing and waning nodules, but need to know if he feels sick before deciding. I will reassess once we have his answer. ( Monday) Also, we may need to treat with antibiotics then PET scan him.   Thanks so much

## 2023-08-15 NOTE — Telephone Encounter (Signed)
 Call report from Tiffany:  8 week f/u scan  IMPRESSION: 1. Lung-RADS 4B, suspicious. Chest CT with or without contrast, PET/CT and/or tissue sampling depending on the probability of malignancy and comorbidities. PET/CT may be used when there is a = 8 mm (= 268 mm) solid component. For new large nodules that develop on an annual repeat screening CT, a 1 month LDCT may be recommended to address potentially infectious or inflammatory conditions. Two separate atypical pulmonary cysts within the left upper lobe demonstrating increasing size, loculation, and wall thickness. See radiologist derived volumetric analysis. 2. The prior lesion in question within the left costophrenic sulcus demonstrates interval decrease in size measuring 13.1 mm in mean diameter. 3. Moderate coronary artery calcification. 4. Morphologic changes in keeping with pulmonary arterial hypertension.   These results will be called to the ordering clinician or representative by the Radiologist Assistant, and communication documented in the PACS or [employer].   Aortic Atherosclerosis (ICD10-I70.0) and Emphysema (ICD10-J43.9).

## 2023-08-18 NOTE — Telephone Encounter (Signed)
 See provider note 08/15/2023

## 2023-08-18 NOTE — Telephone Encounter (Signed)
 Per Lauraine Lites, NP. Please schedule pt ov to see her for treatment. Then will assess need for repeat CT.

## 2023-08-19 NOTE — Telephone Encounter (Signed)
 Spoke with patient and he has been scheduled for 8/11 at 1:30 pm.

## 2023-08-25 ENCOUNTER — Encounter: Payer: Self-pay | Admitting: Acute Care

## 2023-08-25 ENCOUNTER — Other Ambulatory Visit: Payer: Self-pay

## 2023-08-25 ENCOUNTER — Ambulatory Visit (INDEPENDENT_AMBULATORY_CARE_PROVIDER_SITE_OTHER): Admitting: Acute Care

## 2023-08-25 VITALS — BP 160/94 | HR 92 | Temp 98.3°F | Ht 69.0 in | Wt 224.0 lb

## 2023-08-25 DIAGNOSIS — J449 Chronic obstructive pulmonary disease, unspecified: Secondary | ICD-10-CM

## 2023-08-25 DIAGNOSIS — F17210 Nicotine dependence, cigarettes, uncomplicated: Secondary | ICD-10-CM

## 2023-08-25 DIAGNOSIS — R918 Other nonspecific abnormal finding of lung field: Secondary | ICD-10-CM | POA: Diagnosis not present

## 2023-08-25 DIAGNOSIS — J441 Chronic obstructive pulmonary disease with (acute) exacerbation: Secondary | ICD-10-CM

## 2023-08-25 DIAGNOSIS — J069 Acute upper respiratory infection, unspecified: Secondary | ICD-10-CM

## 2023-08-25 MED ORDER — BREZTRI AEROSPHERE 160-9-4.8 MCG/ACT IN AERO
2.0000 | INHALATION_SPRAY | Freq: Two times a day (BID) | RESPIRATORY_TRACT | 6 refills | Status: DC
  Filled 2023-08-25: qty 10.7, 30d supply, fill #0
  Filled 2023-08-25: qty 1, fill #0
  Filled 2023-10-10: qty 10.7, 30d supply, fill #1

## 2023-08-25 MED ORDER — IPRATROPIUM-ALBUTEROL 0.5-2.5 (3) MG/3ML IN SOLN
3.0000 mL | Freq: Four times a day (QID) | RESPIRATORY_TRACT | 0 refills | Status: DC | PRN
  Filled 2023-08-25 – 2023-09-05 (×3): qty 1080, 90d supply, fill #0

## 2023-08-25 MED ORDER — DOXYCYCLINE HYCLATE 100 MG PO TABS
100.0000 mg | ORAL_TABLET | Freq: Two times a day (BID) | ORAL | 0 refills | Status: DC
  Filled 2023-08-25: qty 14, 7d supply, fill #0

## 2023-08-25 MED ORDER — PREDNISONE 10 MG PO TABS
ORAL_TABLET | ORAL | 0 refills | Status: AC
  Filled 2023-08-25: qty 20, 8d supply, fill #0

## 2023-08-25 NOTE — Progress Notes (Signed)
 History of Present Illness [name] is a 68 y.o. male current every day smoker followed by the lung cancer screening scan for an abnormal LDCT. He will be followed by Dr. Shelah.   08/25/2023 Discussed the use of AI scribe software for clinical note transcription with the patient, who gave verbal consent to proceed.  History of Present Illness [name] is a 68 year old male current every day smoker with COPD who presents for follow up CT Chest, as well as  worsening shortness of breath .  He has been experiencing significant difficulty breathing, particularly during a recent heat wave. He uses his albuterol  rescue inhaler frequently, approximately three times when walking short distances in the heat. Additionally, he uses his nebulizer more frequently, every four hours, which exceeds the recommended usage of up to four times a day. He wakes up in the middle of the night to use the nebulizer to help him sleep.  He has been out of his Breztri  inhaler and only recently called to refill it, planning to pick it up today. He has been using his albuterol  for the nebulizer more in the last two to three weeks than previously, and is down to one box from an initial five or six boxes. He has been using it every four hours.  He is not coughing up much mucus, and when he does, it is clear. No fever, but he notes experiencing chills, which he attributes to air conditioning. He continues to smoke cigarettes despite acknowledging that quitting would likely improve his breathing.  He is aware of a previous lung cancer screening. He was originally seen in 07/2023, and treated with Doxycycline  to see if there was any improvement with his scan.  We reviewed the repeat scan today, which shows that two nodules have increased in size since the last scan in May, while another has decreased in size. .   As I suspect he is in the middle of a COPD flare, we will treat again and do a PET scan  to further evaluate after treatment. He is in agreement with this plan.   His current medications include albuterol  inhaler, Breztri , and albuterol  for the nebulizer. He was unaware of having Duoneb, which he has used in the past, and is out of refills. He is also not diabetic and cannot take penicillin  or codeine.  I have re-ordered his DuoNebs and I have refilled his Breztri . He had been over using his albuterol  inhaler, and cannot get it filled for awhile.      Test Results: LDCT 08/07/2023 Lung-RADS 4B, suspicious. Chest CT with or without contrast, PET/CT and/or tissue sampling depending on the probability of malignancy and comorbidities. PET/CT may be used when there is a = 8 mm (= 268 mm) solid component. For new large nodules that develop on an annual repeat screening CT, a 1 month LDCT may be recommended to address potentially infectious or inflammatory conditions. Two separate atypical pulmonary cysts within the left upper lobe demonstrating increasing size, loculation, and wall thickness. See radiologist derived volumetric analysis. 2. The prior lesion in question within the left costophrenic sulcus demonstrates interval decrease in size measuring 13.1 mm in mean diameter. 3. Moderate coronary artery calcification. 4. Morphologic changes in keeping with pulmonary arterial hypertension.  LDCT 06/02/2023 Lung-RADS 4B, suspicious. Additional imaging evaluation or consultation with Pulmonology or Thoracic Surgery recommended. Interval development of multiple new pulmonary nodules, nodular consolidative opacities, and sub solid pulmonary lesions in both lungs. Dominant  solid opacities in the left posterior costophrenic sulcus measuring 17.5 mm. These may well be infectious/inflammatory but warrants close follow-up. 2. Diffuse bronchial wall thickening with scattered areas of mild cylindrical bronchiectasis, lower lung predominant. Imaging features suggest chronic  bronchitis. 3. Aortic Atherosclerosis (ICD10-I70.0) and Emphysema (ICD      Latest Ref Rng & Units 12/14/2022    5:49 AM 12/13/2022    5:45 AM 12/12/2022   10:03 PM  CBC  WBC 4.0 - 10.5 K/uL 10.2  4.0  6.0   Hemoglobin 13.0 - 17.0 g/dL 85.5  84.2  84.7   Hematocrit 39.0 - 52.0 % 44.8  47.6  44.2   Platelets 150 - 400 K/uL 159  150  151        Latest Ref Rng & Units 12/14/2022    5:49 AM 12/13/2022    5:45 AM 12/12/2022   10:03 PM  BMP  Glucose 70 - 99 mg/dL 854  795  888   BUN 8 - 23 mg/dL 16  12  13    Creatinine 0.61 - 1.24 mg/dL 9.13  9.18  9.08   Sodium 135 - 145 mmol/L 140  137  138   Potassium 3.5 - 5.1 mmol/L 4.8  4.0  3.4   Chloride 98 - 111 mmol/L 101  99  101   CO2 22 - 32 mmol/L 33  28  27   Calcium  8.9 - 10.3 mg/dL 8.8  8.6  8.8     BNP    Component Value Date/Time   BNP 34.0 12/12/2022 2203    ProBNP No results found for: PROBNP  PFT No results found for: FEV1PRE, FEV1POST, FVCPRE, FVCPOST, TLC, DLCOUNC, PREFEV1FVCRT, PSTFEV1FVCRT  CT CHEST LCS NODULE F/U LOW DOSE WO CONTRAST Result Date: 08/14/2023 CLINICAL DATA:  Lung-RADS Score 4B - Very Suspicious. Current smoker, 47 pack-year smoking history, asymptomatic EXAM: CT CHEST WITHOUT CONTRAST FOR LUNG CANCER SCREENING NODULE FOLLOW-UP TECHNIQUE: Multidetector CT imaging of the chest was performed following the standard protocol without IV contrast. RADIATION DOSE REDUCTION: This exam was performed according to the departmental dose-optimization program which includes automated exposure control, adjustment of the mA and/or kV according to patient size and/or use of iterative reconstruction technique. COMPARISON:  None Available. FINDINGS: Cardiovascular: Moderate coronary artery calcification. Global cardiac size iswithin normal limits. No pericardial effusion. Central pulmonary arteries are enlarged in keeping with changes of pulmonary arterial hypertension. Mild atherosclerotic calcification  within the thoracic aorta. No aortic aneurysm. Mediastinum/Nodes: No enlarged mediastinal, hilar, or axillary lymph nodes. Thyroid gland, trachea, and esophagus demonstrate no significant findings. Lungs/Pleura: Mild emphysema. Bronchial wall thickening noted in keeping with airway inflammation. Numerous pulmonary nodules are again identified. The lesion in question within the left costophrenic sulcus demonstrates interval decrease in size measuring 13.1 mm in mean diameter (264/3). However, there are 2 separate atypical pulmonary cysts which demonstrate increasing size, loculation, and wall thickness. These are seen within the left upper lobe (87/3) measuring 21.6 mm in mean diameter and within the left upper lobe (126/3 measuring 15.6 mm in mean diameter. See radiologist derived volumetric analysis. No pneumothorax or pleural effusion. Upper Abdomen: No acute abnormality. Musculoskeletal: No acute bone abnormality. No lytic or blastic bone lesion. Osseous structures are age appropriate. IMPRESSION: 1. Lung-RADS 4B, suspicious. Chest CT with or without contrast, PET/CT and/or tissue sampling depending on the probability of malignancy and comorbidities. PET/CT may be used when there is a = 8 mm (= 268 mm) solid component. For new large nodules that develop  on an annual repeat screening CT, a 1 month LDCT may be recommended to address potentially infectious or inflammatory conditions. Two separate atypical pulmonary cysts within the left upper lobe demonstrating increasing size, loculation, and wall thickness. See radiologist derived volumetric analysis. 2. The prior lesion in question within the left costophrenic sulcus demonstrates interval decrease in size measuring 13.1 mm in mean diameter. 3. Moderate coronary artery calcification. 4. Morphologic changes in keeping with pulmonary arterial hypertension. These results will be called to the ordering clinician or representative by the Radiologist Assistant, and  communication documented in the PACS or [employer]. Aortic Atherosclerosis (ICD10-I70.0) and Emphysema (ICD10-J43.9). Electronically Signed   By: Dorethia Molt M.D.   On: 08/14/2023 23:01     Past medical hx Past Medical History:  Diagnosis Date   Asthma    COPD (chronic obstructive pulmonary disease) (HCC)    Hx of adenomatous polyp of colon 01/13/2020   Hypertension      Social History   Tobacco Use   Smoking status: Every Day    Current packs/day: 0.30    Average packs/day: 1 pack/day for 45.7 years (45.2 ttl pk-yrs)    Types: Cigarettes    Start date: 12/2022   Smokeless tobacco: Never   Tobacco comments:    12 CIGARETTES/DAY AJP 08/25/2023  Vaping Use   Vaping status: Never Used  Substance Use Topics   Alcohol use: Yes    Alcohol/week: 1.0 standard drink of alcohol    Types: 1 Standard drinks or equivalent per week    Comment: every once in awhile-3 times a month per pt   Drug use: Yes    Frequency: 1.0 times per week    Types: Marijuana    Comment: occasionally marijuana-3 times a month per pt    Mr.Bernabe reports that he has been smoking cigarettes. He started smoking about 8 months ago. He has a 45.2 pack-year smoking history. He has never used smokeless tobacco. He reports current alcohol use of about 1.0 standard drink of alcohol per week. He reports current drug use. Frequency: 1.00 time per week. Drug: Marijuana.  Tobacco Cessation: Ready to quit: Not Answered Counseling given: Not Answered Tobacco comments: 12 CIGARETTES/DAY AJP 08/25/2023 Current every day smoker , I spent 3-4 minutes counseling patient on  steps to stop use of tobacco products. I have provided patient with information on receiving free nicotine  replacement therapy, and contact numbers for hypnosis for smoking cessation as well as acupuncture for smoking cessation.   Past surgical hx, Family hx, Social hx all reviewed.  Current Outpatient Medications on File Prior to Visit   Medication Sig   albuterol  (VENTOLIN  HFA) 108 (90 Base) MCG/ACT inhaler Inhale 2 puffs into the lungs every 6 (six) hours as needed for wheezing or shortness of breath.   atorvastatin  (LIPITOR) 40 MG tablet Take 1 tablet (40 mg total) by mouth daily. Please schedule PCP appt for more refills.   Misc. Devices MISC Nebulizer device.  Diagnosis COPD   albuterol  (VENTOLIN  HFA) 108 (90 Base) MCG/ACT inhaler Inhale 1-2 puffs into the lungs every 4 to 6 hours as needed for shortness of breath and wheezing (Patient not taking: Reported on 08/25/2023)   aspirin  EC 81 MG tablet Take 1 tablet by mouth in the morning daily. (Patient not taking: Reported on 08/25/2023)   ergocalciferol  (VITAMIN D2) 1.25 MG (50000 UT) capsule Take 1 capsule by mouth once a week for 12 weeks. (Patient not taking: Reported on 08/25/2023)   furosemide  (LASIX ) 20  MG tablet Take 1 tablet (20 mg total) by mouth in the morning for high blood pressure and swelling. (Patient not taking: Reported on 08/25/2023)   furosemide  (LASIX ) 40 MG tablet Take 1 tablet (40 mg total) by mouth daily for 7 days. (Patient not taking: Reported on 08/25/2023)   furosemide  (LASIX ) 40 MG tablet Take 1 pill by mouth once daily. (Patient not taking: Reported on 08/25/2023)   losartan  (COZAAR ) 25 MG tablet Take 1 tablet (25 mg total) by mouth daily. Please schedule PCP appt for more refills. (Patient not taking: Reported on 08/25/2023)   No current facility-administered medications on file prior to visit.     Allergies  Allergen Reactions   Codeine Itching   Penicillins Itching and Other (See Comments)    Review Of Systems:  Constitutional:   No  weight loss, night sweats,  Fevers, + chills, No fatigue, or  lassitude.  HEENT:   No headaches,  Difficulty swallowing,  Tooth/dental problems, or  Sore throat,                No sneezing, itching, ear ache, nasal congestion, post nasal drip,   CV:  No chest pain,  Orthopnea, PND, swelling in lower extremities,  anasarca, dizziness, palpitations, syncope.   GI  No heartburn, indigestion, abdominal pain, nausea, vomiting, diarrhea, change in bowel habits, loss of appetite, bloody stools.   Resp: + shortness of breath with exertion less at rest.  + baseline  excess mucus, + baseline  productive cough,  No non-productive cough,  No coughing up of blood.  + change in color of mucus.  + baseline  wheezing.  No chest wall deformity  Skin: no rash or lesions.  GU: no dysuria, change in color of urine, no urgency or frequency.  No flank pain, no hematuria   MS:  No joint pain or swelling.  No decreased range of motion.  No back pain.  Psych:  No change in mood or affect. No depression or anxiety.  No memory loss.   Vital Signs BP (!) 160/94   Pulse 92   Temp 98.3 F (36.8 C) (Oral)   Ht 5' 9 (1.753 m)   Wt 224 lb (101.6 kg)   SpO2 93%   BMI 33.08 kg/m    Physical Exam:  General- No distress,  A&Ox3, pleasant ENT: No sinus tenderness, TM clear, pale nasal mucosa, no oral exudate,no post nasal drip, no LAN Cardiac: S1, S2, regular rate and rhythm, no murmur Chest: + wheeze/ rales/ dullness; no accessory muscle use, no nasal flaring, no sternal retractions Abd.: Soft Non-tender, ND, BS +, Body mass index is 33.08 kg/m.  Ext: No clubbing cyanosis, edema, no obvious deformities Neuro:  normal strength, MAE x 4, A&O x 3 Skin: No rashes, warm and dry, no obvious skin lesions  Psych: normal mood and behavior  Assessment & Plan Chronic obstructive pulmonary disease (COPD) with acute exacerbation Significant symptom escalation noted with increased albuterol  and nebulizer use.  + significant cough or sputum, denies fever, occasional chills likely from air conditioning. - Refilled Breztri  with six additional refills. - Refilled DuoNeb for nebulizer, to be used every six hours as needed, no more. - Prescribed doxycycline , one tablet in the morning and one in the evening for one week. - Prescribed  prednisone  taper: four tablets for two days, three tablets for two days, two tablets for two days, and one tablet for two days. - Educated on proper use of inhalers and nebulizers.  Pulmonary  nodules, left upper lobe, increasing in size CT scan shows two nodules in left upper lobe increased in size, measuring 21.6 mm and 15.6 mm. Differential includes infection, inflammation, or malignancy. Concern for malignancy due to size increase and smoking history. Plan to treat with antibiotics and prednisone  before further diagnostics.  PET scan to assess metabolic activity after above treatment >>  Biopsy discussed if needed. - Ordered PET scan at [facility] to assess for metabolic activity in the nodules. - Treat with doxycycline  and prednisone  as outlined above. - Follow up with results of PET scan.  Tobacco use disorder Continues smoking despite understanding negative impact on respiratory health. Acknowledges exacerbation of breathing difficulties but cites stress and anger as barriers to quitting. - Provided information on smoking cessation resources, including free nicotine  replacement options such as gum and patches. - Encouraged smoking cessation to improve respiratory health. - You can receive free nicotine  replacement therapy (patches, gum, or mints) by calling 1-800-QUIT NOW. Please call so we can get you on the path to becoming a non-smoker. I know it is hard, but you can do this!  Hypnosis for smoking cessation  [employer] [phone]  Acupuncture for smoking cessation  [employer] [phone]     I spent 35 minutes dedicated to the care of this patient on the date of this encounter to include pre-visit review of records, face-to-face time with the patient discussing conditions above, post visit ordering of testing, clinical documentation with the electronic health record, making appropriate referrals as documented, and communicating necessary  information to the patient's healthcare team.     Lauraine JULIANNA Lites, NP 08/25/2023  2:05 PM

## 2023-08-25 NOTE — Patient Instructions (Signed)
 It is good to see you today. Your CT Chest shows 2 enlarging lug nodules. We will do a PET scan in 2 weeks to better evaluate these.  You will get a call to get this scheduled. I have ordered an antibiotic , Doxycycline   to treat you. You will take 1 tablet twice daily x 1 week. I have also sent in a prescription for a prednisone  taper. Prednisone  taper; 10 mg tablets: 4 tabs x 2 days, 3 tabs x 2 days, 2 tabs x 2 days 1 tab x 2 days then stop.  I have renewed your Breztri  and your Duonebs to help with your dyspnea. Take as instructed. I will see you about 1 week after the PET scan is completed. Call if you need us  sooner. Please contact office for sooner follow up if symptoms do not improve or worsen or seek emergency care

## 2023-08-26 ENCOUNTER — Other Ambulatory Visit (HOSPITAL_BASED_OUTPATIENT_CLINIC_OR_DEPARTMENT_OTHER): Payer: Self-pay

## 2023-09-01 ENCOUNTER — Other Ambulatory Visit: Payer: Self-pay

## 2023-09-03 ENCOUNTER — Encounter (HOSPITAL_COMMUNITY)
Admission: RE | Admit: 2023-09-03 | Discharge: 2023-09-03 | Disposition: A | Discharge status: Home / Self Care | Source: Ambulatory Visit | Attending: Acute Care | Admitting: Acute Care

## 2023-09-03 ENCOUNTER — Other Ambulatory Visit: Payer: Self-pay

## 2023-09-03 DIAGNOSIS — R918 Other nonspecific abnormal finding of lung field: Secondary | ICD-10-CM | POA: Diagnosis present

## 2023-09-03 LAB — GLUCOSE, CAPILLARY: Glucose-Capillary: 112 mg/dL — ABNORMAL HIGH (ref 70–99)

## 2023-09-03 MED ORDER — FLUDEOXYGLUCOSE F - 18 (FDG) INJECTION
11.1000 | Freq: Once | INTRAVENOUS | Status: AC | PRN
  Administered 2023-09-03: 12.1 via INTRAVENOUS

## 2023-09-05 ENCOUNTER — Other Ambulatory Visit: Payer: Self-pay

## 2023-09-17 ENCOUNTER — Other Ambulatory Visit: Payer: Self-pay

## 2023-09-17 ENCOUNTER — Encounter: Payer: Self-pay | Admitting: Acute Care

## 2023-09-17 ENCOUNTER — Ambulatory Visit (INDEPENDENT_AMBULATORY_CARE_PROVIDER_SITE_OTHER): Admitting: Acute Care

## 2023-09-17 VITALS — BP 134/80 | HR 94 | Temp 97.9°F | Ht 69.0 in | Wt 221.0 lb

## 2023-09-17 DIAGNOSIS — J441 Chronic obstructive pulmonary disease with (acute) exacerbation: Secondary | ICD-10-CM

## 2023-09-17 DIAGNOSIS — R911 Solitary pulmonary nodule: Secondary | ICD-10-CM

## 2023-09-17 DIAGNOSIS — Z720 Tobacco use: Secondary | ICD-10-CM

## 2023-09-17 DIAGNOSIS — F17210 Nicotine dependence, cigarettes, uncomplicated: Secondary | ICD-10-CM | POA: Diagnosis not present

## 2023-09-17 DIAGNOSIS — R9389 Abnormal findings on diagnostic imaging of other specified body structures: Secondary | ICD-10-CM

## 2023-09-17 MED ORDER — BREZTRI AEROSPHERE 160-9-4.8 MCG/ACT IN AERO: 2.0000 | INHALATION_SPRAY | Freq: Two times a day (BID) | RESPIRATORY_TRACT | Status: DC

## 2023-09-17 MED ORDER — ALBUTEROL SULFATE HFA 108 (90 BASE) MCG/ACT IN AERS
2.0000 | INHALATION_SPRAY | Freq: Four times a day (QID) | RESPIRATORY_TRACT | 11 refills | Status: DC | PRN
  Filled 2023-09-17: qty 54, 75d supply, fill #0

## 2023-09-17 NOTE — Patient Instructions (Addendum)
 It is good to see you today.  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 We discussed moving forward a biopsy vs continued surveillance monitoring, and you prefer short interval surveillance.  We will do a 3 month follow up scan , which will be due around the end of November 2025.  You will get a call to get this scheduled closer to the time. You will follow up with me 1-2 weeks after the scan to review results. If this continues to grow, we will need to do a biopsy. I have renewed your albuterol  , use as prescribed.  I will give you a sample of Breztri  to use until you can get your prescription refilled.  Follow up in 3 months , call if you develop any blood in your sputum or weight loss.  Please contact office for sooner follow up if symptoms do not improve or worsen or seek emergency care .

## 2023-09-17 NOTE — Progress Notes (Signed)
 History of Present Illness [name] is a 68 y.o. male current every day smoker followed by Dr. Kara and the lung cancer screening program. He has been referred for an abnormal lung cancer screening scan.   Synopsis Pt. followed follow-up to the lung cancer screening program, was seen 07/15/2023 for an abnormal lung cancer screening scan.  The scan was done 06/02/2023 and read 06/21/2023.  It was read as a lung RADS 4B.  Radiology felt this may be infectious or inflammatory although patient does not recall being sick at the time he had the scan done.  He denied any weight loss or change in his breathing.  He denied hemoptysis.   He was treated with doxycycline  and repeat scan was done 1 month later to reevaluate the nodule.  There was no significant change or improvement after treatment with antibiotic therefore plan was to move forward with a PET scan to better evaluate for malignancy.  Patient presents today to follow-up after PET.   09/17/2023 Discussed the use of AI scribe software for clinical note transcription with the patient, who gave verbal consent to proceed.  History of Present Illness Patient presents after PET scan to better evaluate left upper lobe and right lower lobe pulmonary nodules noted on lung cancer screening.  The patient states he has been doing well from a respiratory status.  No breathing issues he states he is currently at his baseline. Pet imaging showed no significant metabolic activity associated with the subsolid nodules in the left upper lobe and the right lower lobe.  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  Patient has not experienced any hemoptysis or any weight loss.  We discussed options moving forward to include bronchoscopy with biopsies for definitive tissue diagnosis versus continued surveillance imaging.  Patient has opted for the continued surveillance imaging.  Plan will be for 61-month follow-up CT  chest and if nodules have grown I explained that we most likely we will need to consider bronchoscopy with biopsy at that point.  If nodules are stable the patient is comfortable with surveillance imaging.     Test Results:  PET Scan 09/03/2023 No significant metabolic activity associated with the sub solid nodules in the LEFT upper lobe and RIGHT lower lobe. 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 2. No evidence of metastatic adenopathy. 3. No evidence of distant metastatic disease.  08/07/2023 LDCT Lung-RADS 4B, suspicious. Chest CT with or without contrast, PET/CT and/or tissue sampling depending on the probability of malignancy and comorbidities. PET/CT may be used when there is a = 8 mm (= 268 mm) solid component. For new large nodules that develop on an annual repeat screening CT, a 1 month LDCT may be recommended to address potentially infectious or inflammatory conditions. Two separate atypical pulmonary cysts within the left upper lobe demonstrating increasing size, loculation, and wall thickness. See radiologist derived volumetric analysis. 2. The prior lesion in question within the left costophrenic sulcus demonstrates interval decrease in size measuring 13.1 mm in mean diameter. 3. Moderate coronary artery calcification. 4. Morphologic changes in keeping with pulmonary arterial hypertension.   LDCT 06/02/2023 Centrilobular and paraseptal emphysema evident. Diffuse bronchial wall thickening evident with scattered areas of mild cylindrical bronchiectasis, lower lung predominant. Interval development of multiple new pulmonary nodules, nodular consolidative opacities, and sub solid pulmonary lesions in both lungs. Dominant solid opacities in the left posterior costophrenic sulcus on image 268 measuring 17.5 mm there is a new 9.1 mm  bilobed pulmonary nodule in the medial right upper lobe on image 71. Additional scattered 4-8 mm pulmonary nodules are  noted bilaterally including a 5.8 mm new left upper lobe nodule on image 159. No pleural effusion.       Latest Ref Rng & Units 12/14/2022    5:49 AM 12/13/2022    5:45 AM 12/12/2022   10:03 PM  CBC  WBC 4.0 - 10.5 K/uL 10.2  4.0  6.0   Hemoglobin 13.0 - 17.0 g/dL 85.5  84.2  84.7   Hematocrit 39.0 - 52.0 % 44.8  47.6  44.2   Platelets 150 - 400 K/uL 159  150  151        Latest Ref Rng & Units 12/14/2022    5:49 AM 12/13/2022    5:45 AM 12/12/2022   10:03 PM  BMP  Glucose 70 - 99 mg/dL 854  795  888   BUN 8 - 23 mg/dL 16  12  13    Creatinine 0.61 - 1.24 mg/dL 9.13  9.18  9.08   Sodium 135 - 145 mmol/L 140  137  138   Potassium 3.5 - 5.1 mmol/L 4.8  4.0  3.4   Chloride 98 - 111 mmol/L 101  99  101   CO2 22 - 32 mmol/L 33  28  27   Calcium  8.9 - 10.3 mg/dL 8.8  8.6  8.8     BNP    Component Value Date/Time   BNP 34.0 12/12/2022 2203    ProBNP No results found for: PROBNP  PFT No results found for: FEV1PRE, FEV1POST, FVCPRE, FVCPOST, TLC, DLCOUNC, PREFEV1FVCRT, PSTFEV1FVCRT  NM PET Image Initial (PI) Skull Base To Thigh Result Date: 09/05/2023 CLINICAL DATA:  Initial treatment strategy for pulmonary nodules. EXAM: NUCLEAR MEDICINE PET SKULL BASE TO THIGH TECHNIQUE: 12.8 mCi F-18 FDG was injected intravenously. Full-ring PET imaging was performed from the skull base to thigh after the radiotracer. CT data was obtained and used for attenuation correction and anatomic localization. Fasting blood glucose: 1 mg/dl COMPARISON:  Low-dose lung cancer screening exam 08/07/2023 FINDINGS: NECK: No hypermetabolic lymph nodes in the neck. Incidental CT findings: None. CHEST: Again demonstrated is sub solid nodules in the LEFT upper lobe. These nodules do not have metabolic activity. More superior LEFT upper lobe nodule measures 14 mm image 21/7. More inferior nodule measures 20 mm image 29/7. No significant change from comparison exam. Similar nodule superior segment of  the RIGHT lower lobe on image 28. This nodule has faint activity less than blood pool (SUV max equal 0.9) Incidental CT findings: None. ABDOMEN/PELVIS: No abnormal hypermetabolic activity within the liver, pancreas, adrenal glands, or spleen. No hypermetabolic lymph nodes in the abdomen or pelvis. Incidental CT findings: None. SKELETON: No focal hypermetabolic activity to suggest skeletal metastasis. Incidental CT findings: None. IMPRESSION: 1. No significant metabolic activity associated with the sub solid nodules in the LEFT upper lobe and RIGHT lower lobe. 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 2. No evidence of metastatic adenopathy. 3. No evidence of distant metastatic disease. Electronically Signed   By: Jackquline Boxer M.D.   On: 09/05/2023 14:48     Past medical hx Past Medical History:  Diagnosis Date   Asthma    COPD (chronic obstructive pulmonary disease) (HCC)    Hx of adenomatous polyp of colon 01/13/2020   Hypertension      Social History   Tobacco Use   Smoking status: Every Day    Current packs/day: 0.30  Average packs/day: 1 pack/day for 45.8 years (45.2 ttl pk-yrs)    Types: Cigarettes    Start date: 12/2022   Smokeless tobacco: Never   Tobacco comments:    6 CIGARETTES/DAY KRD 09/17/2023  Vaping Use   Vaping status: Never Used  Substance Use Topics   Alcohol use: Yes    Alcohol/week: 1.0 standard drink of alcohol    Types: 1 Standard drinks or equivalent per week    Comment: every once in awhile-3 times a month per pt   Drug use: Yes    Frequency: 1.0 times per week    Types: Marijuana    Comment: occasionally marijuana-3 times a month per pt    [name] reports that he has been smoking cigarettes. He started smoking about 9 months ago. He has a 45.2 pack-year smoking history. He has never used smokeless tobacco. He reports current alcohol use of about 1.0 standard drink of alcohol per week. He reports current drug use. Frequency: 1.00  time per week. Drug: Marijuana.  Tobacco Cessation: Ready to quit: Not Answered Counseling given: Not Answered Tobacco comments: 6 CIGARETTES/DAY KRD 09/17/2023 Current every day smoker , I spent 3-4 minutes counseling patient on  steps to stop use of tobacco products. I have provided patient with information on receiving free nicotine  replacement therapy, and contact numbers for hypnosis for smoking cessation as well as acupuncture for smoking cessation.   Past surgical hx, Family hx, Social hx all reviewed.  Current Outpatient Medications on File Prior to Visit  Medication Sig   albuterol  (VENTOLIN  HFA) 108 (90 Base) MCG/ACT inhaler Inhale 2 puffs into the lungs every 6 (six) hours as needed for wheezing or shortness of breath.   aspirin  EC 81 MG tablet Take 1 tablet by mouth in the morning daily.   atorvastatin  (LIPITOR) 40 MG tablet Take 1 tablet (40 mg total) by mouth daily. Please schedule PCP appt for more refills.   budesonide -glycopyrrolate -formoterol  (BREZTRI  AEROSPHERE) 160-9-4.8 MCG/ACT AERO inhaler Inhale 2 puffs into the lungs in the morning and at bedtime.   ipratropium-albuterol  (DUONEB) 0.5-2.5 (3) MG/3ML SOLN Take 3 mLs by nebulization every 6 (six) hours as needed (severe wheezing).   losartan  (COZAAR ) 25 MG tablet Take 1 tablet (25 mg total) by mouth daily. Please schedule PCP appt for more refills.   Misc. Devices MISC Nebulizer device.  Diagnosis COPD   ergocalciferol  (VITAMIN D2) 1.25 MG (50000 UT) capsule Take 1 capsule by mouth once a week for 12 weeks. (Patient not taking: Reported on 09/17/2023)   furosemide  (LASIX ) 20 MG tablet Take 1 tablet (20 mg total) by mouth in the morning for high blood pressure and swelling. (Patient not taking: Reported on 09/17/2023)   No current facility-administered medications on file prior to visit.     Allergies  Allergen Reactions   Codeine Itching   Penicillins Itching and Other (See Comments)    Review Of  Systems:  Constitutional:   No  weight loss, night sweats,  Fevers, chills, fatigue, or  lassitude.  HEENT:   No headaches,  Difficulty swallowing,  Tooth/dental problems, or  Sore throat,                No sneezing, itching, ear ache, nasal congestion, post nasal drip,   CV:  No chest pain,  Orthopnea, PND, swelling in lower extremities, anasarca, dizziness, palpitations, syncope.   GI  No heartburn, indigestion, abdominal pain, nausea, vomiting, diarrhea, change in bowel habits, loss of appetite, bloody stools.   Resp:  No shortness of breath with exertion or at rest.  No excess mucus, no productive cough,  No non-productive cough,  No coughing up of blood.  No change in color of mucus.  No wheezing.  No chest wall deformity  Skin: no rash or lesions.  GU: no dysuria, change in color of urine, no urgency or frequency.  No flank pain, no hematuria   MS:  No joint pain or swelling.  No decreased range of motion.  No back pain.  Psych:  No change in mood or affect. No depression or anxiety.  No memory loss.   Vital Signs BP 134/80   Pulse 94   Temp 97.9 F (36.6 C) (Oral)   Ht 5' 9 (1.753 m)   Wt 221 lb (100.2 kg)   SpO2 90%   BMI 32.64 kg/m    Physical Exam:  General- No distress,  A&Ox3, pleasant ENT: No sinus tenderness, TM clear, pale nasal mucosa, no oral exudate,no post nasal drip, no LAN Cardiac: S1, S2, regular rate and rhythm, no murmur Chest: No wheeze/ rales/ dullness; no accessory muscle use, no nasal flaring, no sternal retractions Abd.: Soft Non-tender, ND, BS +, Body mass index is 32.64 kg/m.  Ext: No clubbing cyanosis, edema, no obvious deformities Neuro:  normal strength, MAE x 4, A&O x 3 Skin: No rashes, warm and dry, no obvious skin lesions  Psych: normal mood and behavior    Assessment & Plan Abnormal lung cancer screening scan Nodules of concern are not hypermetabolic on PET however remain concerning for an indolent adenocarcinoma Current  everyday smoker Plan Your PET scan did not show significant uptake on PET, but remains concerning for a slow growing cancer. We discussed moving forward a biopsy vs continued surveillance monitoring, and you prefer short interval surveillance.  We will do a 3 month follow up scan , which will be due around the end of November 2025.  You will get a call to get this scheduled closer to the time. You will follow up with me 1-2 weeks after the scan to review results. If this continues to grow, we will need to do a biopsy. I have renewed your albuterol  , use as prescribed.  I will give you a sample of Breztri  to use until you can get your prescription refilled.  Follow up in 3 months , call if you develop any blood in your sputum or weight loss.  Please contact office for sooner follow up if symptoms do not improve or worsen or seek emergency care .   I spent 20 minutes dedicated to the care of this patient on the date of this encounter to include pre-visit review of records, face-to-face time with the patient discussing conditions above, post visit ordering of testing, clinical documentation with the electronic health record, making appropriate referrals as documented, and communicating necessary information to the patient's healthcare team.     Lauraine JULIANNA Lites, NP 09/17/2023  10:51 AM

## 2023-09-19 ENCOUNTER — Other Ambulatory Visit: Payer: Self-pay

## 2023-10-10 ENCOUNTER — Other Ambulatory Visit: Payer: Self-pay

## 2023-10-10 MED ORDER — ASPIRIN 81 MG PO TBEC
81.0000 mg | DELAYED_RELEASE_TABLET | Freq: Every morning | ORAL | 3 refills | Status: DC
  Filled 2023-10-10: qty 90, 90d supply, fill #0

## 2023-10-10 MED ORDER — MONTELUKAST SODIUM 10 MG PO TABS
10.0000 mg | ORAL_TABLET | Freq: Every evening | ORAL | 3 refills | Status: AC
  Filled 2023-10-10: qty 90, 90d supply, fill #0

## 2023-10-10 MED ORDER — ALBUTEROL SULFATE HFA 108 (90 BASE) MCG/ACT IN AERS
1.0000 | INHALATION_SPRAY | RESPIRATORY_TRACT | 3 refills | Status: DC | PRN
  Filled 2023-10-10: qty 54, 90d supply, fill #0
  Filled 2023-11-07: qty 54, 51d supply, fill #0

## 2023-10-10 MED ORDER — FLUTICASONE PROPIONATE 50 MCG/ACT NA SUSP
2.0000 | Freq: Every day | NASAL | 3 refills | Status: DC
  Filled 2023-10-10: qty 48, 90d supply, fill #0

## 2023-10-10 MED ORDER — BREZTRI AEROSPHERE 160-9-4.8 MCG/ACT IN AERO
INHALATION_SPRAY | RESPIRATORY_TRACT | 3 refills | Status: DC
  Filled 2023-10-10: qty 32.1, 90d supply, fill #0
  Filled 2023-11-07: qty 10.7, 30d supply, fill #0
  Filled 2023-12-09: qty 10.7, 30d supply, fill #1

## 2023-10-10 MED ORDER — ATORVASTATIN CALCIUM 40 MG PO TABS
40.0000 mg | ORAL_TABLET | Freq: Every day | ORAL | 3 refills | Status: AC
  Filled 2023-10-10: qty 90, 90d supply, fill #0

## 2023-10-10 MED ORDER — IPRATROPIUM-ALBUTEROL 0.5-2.5 (3) MG/3ML IN SOLN
3.0000 mL | Freq: Four times a day (QID) | RESPIRATORY_TRACT | 3 refills | Status: DC
  Filled 2023-10-10: qty 1080, 90d supply, fill #0

## 2023-10-10 MED ORDER — LOSARTAN POTASSIUM 25 MG PO TABS
25.0000 mg | ORAL_TABLET | Freq: Every morning | ORAL | 3 refills | Status: AC
  Filled 2023-10-10: qty 90, 90d supply, fill #0

## 2023-10-10 MED ORDER — FUROSEMIDE 20 MG PO TABS
20.0000 mg | ORAL_TABLET | Freq: Every morning | ORAL | 3 refills | Status: AC
  Filled 2023-10-10: qty 90, 90d supply, fill #0

## 2023-11-07 ENCOUNTER — Other Ambulatory Visit: Payer: Self-pay

## 2023-12-08 ENCOUNTER — Ambulatory Visit (HOSPITAL_COMMUNITY)
Admission: RE | Admit: 2023-12-08 | Discharge: 2023-12-08 | Disposition: A | Discharge status: Home / Self Care | Source: Ambulatory Visit | Attending: Acute Care | Admitting: Acute Care

## 2023-12-08 DIAGNOSIS — R911 Solitary pulmonary nodule: Secondary | ICD-10-CM | POA: Diagnosis present

## 2023-12-09 ENCOUNTER — Other Ambulatory Visit: Payer: Self-pay

## 2023-12-10 ENCOUNTER — Other Ambulatory Visit: Payer: Self-pay

## 2023-12-14 ENCOUNTER — Emergency Department (HOSPITAL_COMMUNITY)

## 2023-12-14 ENCOUNTER — Other Ambulatory Visit: Payer: Self-pay

## 2023-12-14 ENCOUNTER — Inpatient Hospital Stay (HOSPITAL_COMMUNITY)
Admission: EM | Admit: 2023-12-14 | Discharge: 2023-12-19 | DRG: 193 | Disposition: A | Attending: Family Medicine | Admitting: Family Medicine

## 2023-12-14 ENCOUNTER — Encounter (HOSPITAL_COMMUNITY): Payer: Self-pay | Admitting: *Deleted

## 2023-12-14 DIAGNOSIS — I5189 Other ill-defined heart diseases: Secondary | ICD-10-CM

## 2023-12-14 DIAGNOSIS — E785 Hyperlipidemia, unspecified: Secondary | ICD-10-CM | POA: Diagnosis present

## 2023-12-14 DIAGNOSIS — E66811 Obesity, class 1: Secondary | ICD-10-CM | POA: Insufficient documentation

## 2023-12-14 DIAGNOSIS — I10 Essential (primary) hypertension: Secondary | ICD-10-CM | POA: Diagnosis present

## 2023-12-14 DIAGNOSIS — J181 Lobar pneumonia, unspecified organism: Secondary | ICD-10-CM | POA: Diagnosis present

## 2023-12-14 DIAGNOSIS — J441 Chronic obstructive pulmonary disease with (acute) exacerbation: Secondary | ICD-10-CM | POA: Diagnosis not present

## 2023-12-14 DIAGNOSIS — J9602 Acute respiratory failure with hypercapnia: Secondary | ICD-10-CM | POA: Diagnosis present

## 2023-12-14 DIAGNOSIS — J9601 Acute respiratory failure with hypoxia: Secondary | ICD-10-CM | POA: Diagnosis not present

## 2023-12-14 DIAGNOSIS — E782 Mixed hyperlipidemia: Secondary | ICD-10-CM | POA: Diagnosis not present

## 2023-12-14 DIAGNOSIS — Z720 Tobacco use: Secondary | ICD-10-CM | POA: Diagnosis present

## 2023-12-14 DIAGNOSIS — J189 Pneumonia, unspecified organism: Secondary | ICD-10-CM | POA: Diagnosis present

## 2023-12-14 LAB — COMPREHENSIVE METABOLIC PANEL WITH GFR
ALT: 18 U/L (ref 0–44)
AST: 29 U/L (ref 15–41)
Albumin: 4.1 g/dL (ref 3.5–5.0)
Alkaline Phosphatase: 105 U/L (ref 38–126)
Anion gap: 9 (ref 5–15)
BUN: 9 mg/dL (ref 8–23)
CO2: 31 mmol/L (ref 22–32)
Calcium: 9.3 mg/dL (ref 8.9–10.3)
Chloride: 97 mmol/L — ABNORMAL LOW (ref 98–111)
Creatinine, Ser: 1 mg/dL (ref 0.61–1.24)
GFR, Estimated: >60 mL/min (ref >60)
Glucose, Bld: 114 mg/dL — ABNORMAL HIGH (ref 70–99)
Potassium: 3.9 mmol/L (ref 3.5–5.1)
Sodium: 136 mmol/L (ref 135–145)
Total Bilirubin: 1.5 mg/dL — ABNORMAL HIGH (ref 0.0–1.2)
Total Protein: 7 g/dL (ref 6.5–8.1)

## 2023-12-14 LAB — CBC WITH DIFFERENTIAL/PLATELET
Abs Immature Granulocytes: 0.06 K/uL (ref 0.00–0.07)
Basophils Absolute: 0 K/uL (ref 0.0–0.1)
Basophils Relative: 0 %
Eosinophils Absolute: 0 K/uL (ref 0.0–0.5)
Eosinophils Relative: 0 %
HCT: 50.2 % (ref 39.0–52.0)
Hemoglobin: 16.5 g/dL (ref 13.0–17.0)
Immature Granulocytes: 1 %
Lymphocytes Relative: 9 %
Lymphs Abs: 1.1 K/uL (ref 0.7–4.0)
MCH: 32.2 pg (ref 26.0–34.0)
MCHC: 32.9 g/dL (ref 30.0–36.0)
MCV: 98 fL (ref 80.0–100.0)
Monocytes Absolute: 1.6 K/uL — ABNORMAL HIGH (ref 0.1–1.0)
Monocytes Relative: 14 %
Neutro Abs: 8.5 K/uL — ABNORMAL HIGH (ref 1.7–7.7)
Neutrophils Relative %: 76 %
Platelets: 153 K/uL (ref 150–400)
RBC: 5.12 MIL/uL (ref 4.22–5.81)
RDW: 14.4 % (ref 11.5–15.5)
WBC: 11.3 K/uL — ABNORMAL HIGH (ref 4.0–10.5)
nRBC: 0 % (ref 0.0–0.2)

## 2023-12-14 LAB — RESP PANEL BY RT-PCR (RSV, FLU A&B, COVID)  RVPGX2
Influenza A by PCR: NEGATIVE
Influenza B by PCR: NEGATIVE
Resp Syncytial Virus by PCR: NEGATIVE
SARS Coronavirus 2 by RT PCR: NEGATIVE

## 2023-12-14 LAB — PROCALCITONIN: Procalcitonin: 0.48 ng/mL

## 2023-12-14 LAB — BLOOD GAS, VENOUS
Acid-Base Excess: 6.1 mmol/L — ABNORMAL HIGH (ref 0.0–2.0)
Bicarbonate: 34 mmol/L — ABNORMAL HIGH (ref 20.0–28.0)
O2 Saturation: 54.6 %
Patient temperature: 36.1
pCO2, Ven: 61 mmHg — ABNORMAL HIGH (ref 44–60)
pH, Ven: 7.35 (ref 7.25–7.43)
pO2, Ven: <31 mmHg — CL (ref 32–45)

## 2023-12-14 LAB — PHOSPHORUS: Phosphorus: 3.1 mg/dL (ref 2.5–4.6)

## 2023-12-14 LAB — MAGNESIUM: Magnesium: 2.1 mg/dL (ref 1.7–2.4)

## 2023-12-14 LAB — TROPONIN T, HIGH SENSITIVITY
Troponin T High Sensitivity: 39 ng/L — ABNORMAL HIGH (ref 0–19)
Troponin T High Sensitivity: 38 ng/L — ABNORMAL HIGH (ref 0–19)

## 2023-12-14 LAB — STREP PNEUMONIAE URINARY ANTIGEN: Strep Pneumo Urinary Antigen: NEGATIVE

## 2023-12-14 LAB — PRO BRAIN NATRIURETIC PEPTIDE: Pro Brain Natriuretic Peptide: 138 pg/mL (ref <300.0)

## 2023-12-14 MED ORDER — PREDNISONE 20 MG PO TABS
40.0000 mg | ORAL_TABLET | Freq: Every day | ORAL | Status: AC
  Administered 2023-12-15 – 2023-12-19 (×5): 40 mg via ORAL
  Filled 2023-12-14 (×5): qty 2

## 2023-12-14 MED ORDER — IPRATROPIUM-ALBUTEROL 0.5-2.5 (3) MG/3ML IN SOLN
3.0000 mL | Freq: Once | RESPIRATORY_TRACT | Status: AC
  Administered 2023-12-14: 3 mL via RESPIRATORY_TRACT
  Filled 2023-12-14: qty 3

## 2023-12-14 MED ORDER — SODIUM CHLORIDE 0.9 % IV SOLN
2.0000 g | Freq: Once | INTRAVENOUS | Status: AC
  Administered 2023-12-14: 2 g via INTRAVENOUS
  Filled 2023-12-14: qty 20

## 2023-12-14 MED ORDER — SODIUM CHLORIDE 0.9 % IV SOLN
2.0000 g | INTRAVENOUS | Status: AC
  Administered 2023-12-15 – 2023-12-18 (×4): 2 g via INTRAVENOUS
  Filled 2023-12-14 (×4): qty 20

## 2023-12-14 MED ORDER — MAGNESIUM SULFATE 2 GM/50ML IV SOLN
2.0000 g | Freq: Once | INTRAVENOUS | Status: AC
  Administered 2023-12-14: 2 g via INTRAVENOUS
  Filled 2023-12-14: qty 50

## 2023-12-14 MED ORDER — ONDANSETRON HCL 4 MG PO TABS: 4.0000 mg | ORAL_TABLET | Freq: Four times a day (QID) | ORAL | Status: DC | PRN

## 2023-12-14 MED ORDER — LOSARTAN POTASSIUM 50 MG PO TABS
25.0000 mg | ORAL_TABLET | Freq: Every day | ORAL | Status: DC
  Administered 2023-12-15 – 2023-12-19 (×5): 25 mg via ORAL
  Filled 2023-12-14 (×5): qty 1

## 2023-12-14 MED ORDER — ACETAMINOPHEN 325 MG PO TABS: 650.0000 mg | ORAL_TABLET | Freq: Four times a day (QID) | ORAL | Status: DC | PRN

## 2023-12-14 MED ORDER — CHLORHEXIDINE GLUCONATE CLOTH 2 % EX PADS
6.0000 | MEDICATED_PAD | Freq: Every day | CUTANEOUS | Status: DC
  Administered 2023-12-16: 6 via TOPICAL

## 2023-12-14 MED ORDER — IPRATROPIUM-ALBUTEROL 0.5-2.5 (3) MG/3ML IN SOLN
3.0000 mL | Freq: Four times a day (QID) | RESPIRATORY_TRACT | Status: DC
  Administered 2023-12-14 – 2023-12-15 (×3): 3 mL via RESPIRATORY_TRACT
  Filled 2023-12-14 (×3): qty 3

## 2023-12-14 MED ORDER — MONTELUKAST SODIUM 10 MG PO TABS
10.0000 mg | ORAL_TABLET | Freq: Every day | ORAL | Status: DC
  Administered 2023-12-14 – 2023-12-18 (×5): 10 mg via ORAL
  Filled 2023-12-14 (×5): qty 1

## 2023-12-14 MED ORDER — ACETAMINOPHEN 650 MG RE SUPP: 650.0000 mg | Freq: Four times a day (QID) | RECTAL | Status: DC | PRN

## 2023-12-14 MED ORDER — ATORVASTATIN CALCIUM 40 MG PO TABS
40.0000 mg | ORAL_TABLET | Freq: Every day | ORAL | Status: DC
  Administered 2023-12-15 – 2023-12-19 (×5): 40 mg via ORAL
  Filled 2023-12-14 (×5): qty 1

## 2023-12-14 MED ORDER — ENOXAPARIN SODIUM 40 MG/0.4ML IJ SOSY
40.0000 mg | PREFILLED_SYRINGE | INTRAMUSCULAR | Status: DC
  Administered 2023-12-14 – 2023-12-18 (×5): 40 mg via SUBCUTANEOUS
  Filled 2023-12-14 (×5): qty 0.4

## 2023-12-14 MED ORDER — SODIUM CHLORIDE 0.9 % IV SOLN
500.0000 mg | INTRAVENOUS | Status: AC
  Administered 2023-12-15 – 2023-12-18 (×4): 500 mg via INTRAVENOUS
  Filled 2023-12-14 (×5): qty 5

## 2023-12-14 MED ORDER — ENSURE PLUS HIGH PROTEIN PO LIQD
237.0000 mL | Freq: Two times a day (BID) | ORAL | Status: DC
  Administered 2023-12-15 – 2023-12-16 (×2): 237 mL via ORAL

## 2023-12-14 MED ORDER — SODIUM CHLORIDE 0.9 % IV SOLN
500.0000 mg | Freq: Once | INTRAVENOUS | Status: AC
  Administered 2023-12-14: 500 mg via INTRAVENOUS

## 2023-12-14 MED ORDER — ASPIRIN 81 MG PO TBEC
81.0000 mg | DELAYED_RELEASE_TABLET | Freq: Every day | ORAL | Status: DC
  Administered 2023-12-15 – 2023-12-19 (×5): 81 mg via ORAL
  Filled 2023-12-14 (×5): qty 1

## 2023-12-14 MED ORDER — ONDANSETRON HCL 4 MG/2ML IJ SOLN: 4.0000 mg | Freq: Four times a day (QID) | INTRAMUSCULAR | Status: DC | PRN

## 2023-12-14 MED ORDER — ALBUTEROL SULFATE (2.5 MG/3ML) 0.083% IN NEBU
10.0000 mg/h | INHALATION_SOLUTION | Freq: Once | RESPIRATORY_TRACT | Status: AC
  Administered 2023-12-14: 10 mg/h via RESPIRATORY_TRACT
  Filled 2023-12-14: qty 12

## 2023-12-14 MED ORDER — ALBUTEROL SULFATE (2.5 MG/3ML) 0.083% IN NEBU
2.5000 mg | INHALATION_SOLUTION | RESPIRATORY_TRACT | Status: DC | PRN
  Administered 2023-12-14: 2.5 mg via RESPIRATORY_TRACT
  Filled 2023-12-14: qty 3

## 2023-12-14 MED ORDER — FUROSEMIDE 20 MG PO TABS
20.0000 mg | ORAL_TABLET | Freq: Every day | ORAL | Status: DC
  Administered 2023-12-15 – 2023-12-19 (×5): 20 mg via ORAL
  Filled 2023-12-14 (×5): qty 1

## 2023-12-14 MED ORDER — METHYLPREDNISOLONE SODIUM SUCC 125 MG IJ SOLR
125.0000 mg | Freq: Once | INTRAMUSCULAR | Status: AC
  Administered 2023-12-14: 125 mg via INTRAVENOUS
  Filled 2023-12-14: qty 2

## 2023-12-14 MED ORDER — POTASSIUM CHLORIDE CRYS ER 20 MEQ PO TBCR
40.0000 meq | EXTENDED_RELEASE_TABLET | Freq: Once | ORAL | Status: AC
  Administered 2023-12-14: 40 meq via ORAL
  Filled 2023-12-14: qty 2

## 2023-12-14 NOTE — ED Triage Notes (Signed)
 Pt arrived POV with family member for respiratory distress. On arrival to triage room, pt noted to be tripoding, diffculty speaking. Brought back to exam room, placed on 2L [place], RA sats 67%. Placed on NRB temporarily with improvement of oxygen and able to speak full sentences.   Reports sob x2 days, attempted to use duoneb at home but felt that it wasn't working. Had family over for the holidays that smoked

## 2023-12-14 NOTE — ED Provider Notes (Signed)
 [place] EMERGENCY DEPARTMENT AT [facility] Provider Note   CSN: [id number] Arrival date & time: 12/14/23  9349     History Chief Complaint  Patient presents with   Respiratory Distress    HPI: [name] is a 68 y.o. male with history pertinent for COPD not on home oxygen, elevated BMI, tobacco use disorder, HTN, T2DM, HLD who presents complaining of shortness of breath. Patient arrived via POV accompanied by family member.  History provided by patient and spouse/partner.  No interpreter required during this encounter.  Patient reports that he has had chronic shortness of breath that has been worsening, however particularly bad over the past 2 days.  Not on home oxygen, does have a home nebulizer, however feels that this has not been alleviating his symptoms.  Endorses subjective fevers, chills, diffuse abdominal pain like he ate something bad.  Denies objectively measured fever, chest pain, nausea, vomiting, diarrhea.  Patient's recorded medical, surgical, social, medication list and allergies were reviewed in the Snapshot window as part of the initial history.   Prior to Admission medications   Medication Sig Start Date End Date Taking? Authorizing Provider  albuterol  (VENTOLIN  HFA) 108 (90 Base) MCG/ACT inhaler Inhale 2 puffs into the lungs every 6 (six) hours as needed for wheezing or shortness of breath. 09/17/23   Ruthell Lauraine FALCON, NP  albuterol  (VENTOLIN  HFA) 108 (90 Base) MCG/ACT inhaler Inhale 1- 2 puffs into the lungs every 4-6 hours (up to 6 times daily) as needed for shortness of breath or wheezing. 10/10/23     aspirin  EC 81 MG tablet Take 1 tablet by mouth in the morning daily. 12/20/22     aspirin  EC 81 MG tablet Take 1 tablet (81 mg total) by mouth in the morning for vascular and heart health . 10/10/23     atorvastatin  (LIPITOR) 40 MG tablet Take 1 tablet (40 mg total) by mouth daily. Please schedule PCP appt for more refills. 04/30/23   Newlin, Enobong,  MD  atorvastatin  (LIPITOR) 40 MG tablet Take 1 tablet (40 mg total) by mouth daily for high cholesterol and heart health . 10/10/23   Arloa Jarvis, NP  budesonide -glycopyrrolate -formoterol  (BREZTRI  AEROSPHERE) 160-9-4.8 MCG/ACT AERO inhaler Inhale 2 puffs into the lungs in the morning and at bedtime. 08/25/23   Ruthell Lauraine FALCON, NP  budesonide -glycopyrrolate -formoterol  (BREZTRI  AEROSPHERE) 160-9-4.8 MCG/ACT AERO inhaler Inhale 2 puffs into the lungs in the morning and at bedtime. 09/17/23   Groce, Sarah F, NP  budesonide -glycopyrrolate -formoterol  (BREZTRI  AEROSPHERE) 160-9-4.8 MCG/ACT AERO inhaler Inhale 2 puffs into the lungs in the morning AND 2 puffs every evening for COPD. 10/10/23     ergocalciferol  (VITAMIN D2) 1.25 MG (50000 UT) capsule Take 1 capsule by mouth once a week for 12 weeks. Patient not taking: Reported on 09/17/2023 12/20/22     fluticasone  (FLONASE ) 50 MCG/ACT nasal spray Place 2 sprays into both nostrils daily for seasonal allergies and flare up 10/10/23     furosemide  (LASIX ) 20 MG tablet Take 1 tablet (20 mg total) by mouth in the morning for high blood pressure and swelling. Patient not taking: Reported on 09/17/2023 01/03/23     furosemide  (LASIX ) 20 MG tablet Take 1 tablet (20 mg total) by mouth in the morning high blood pressure and swelling . 10/10/23     ipratropium-albuterol  (DUONEB) 0.5-2.5 (3) MG/3ML SOLN Take 3 mLs by nebulization every 6 (six) hours as needed (severe wheezing). 08/25/23   Ruthell Lauraine FALCON, NP  ipratropium-albuterol  (DUONEB) 0.5-2.5 (3)  MG/3ML SOLN Take 3 mLs by nebulization in the morning, at noon, in the evening, and at bedtime as needed for moderate to severe shortness of breath, COPD flare up or wheezing 10/10/23     losartan  (COZAAR ) 25 MG tablet Take 1 tablet (25 mg total) by mouth daily. Please schedule PCP appt for more refills. 04/30/23   Newlin, Enobong, MD  losartan  (COZAAR ) 25 MG tablet Take 1 tablet (25 mg total) by mouth in the morning for high blood  pressure 10/10/23     Misc. Devices MISC Nebulizer device.  Diagnosis COPD 05/15/22   Delbert Clam, MD  montelukast  (SINGULAIR ) 10 MG tablet Take 1 tablet (10 mg total) by mouth every evening for flare up and seasonal allergies. 10/10/23        Allergies: Codeine and Penicillins   Review of Systems   ROS as per HPI  Physical Exam Updated Vital Signs BP (!) 171/110 (BP Location: Left Arm)   Pulse (!) 125   Resp (!) 26   SpO2 99%  Physical Exam Vitals and nursing note reviewed.  Constitutional:      General: He is not in acute distress.    Appearance: He is well-developed.  HENT:     Head: Normocephalic and atraumatic.  Eyes:     Conjunctiva/sclera: Conjunctivae normal.  Cardiovascular:     Rate and Rhythm: Regular rhythm. Tachycardia present.     Heart sounds: No murmur heard. Pulmonary:     Effort: Pulmonary effort is normal. Tachypnea and prolonged expiration present.     Breath sounds: Decreased breath sounds (Diffusely) and wheezing (Diffusely) present.     Comments: Saturating on the mid 90s on nonrebreather Abdominal:     Palpations: Abdomen is soft.     Tenderness: There is no abdominal tenderness.  Musculoskeletal:        General: No swelling.     Cervical back: Neck supple.  Skin:    General: Skin is warm and dry.     Capillary Refill: Capillary refill takes less than 2 seconds.  Neurological:     Mental Status: He is alert.  Psychiatric:        Mood and Affect: Mood normal.     ED Course/ Medical Decision Making/ A&P    Procedures Procedures   Medications Ordered in ED Medications  methylPREDNISolone  sodium succinate (SOLU-MEDROL ) 125 mg/2 mL injection 125 mg (has no administration in time range)  ipratropium-albuterol  (DUONEB) 0.5-2.5 (3) MG/3ML nebulizer solution 3 mL (has no administration in time range)    And  ipratropium-albuterol  (DUONEB) 0.5-2.5 (3) MG/3ML nebulizer solution 3 mL (has no administration in time range)    And   ipratropium-albuterol  (DUONEB) 0.5-2.5 (3) MG/3ML nebulizer solution 3 mL (has no administration in time range)    Medical Decision Making:   [name] is a 68 y.o. male who presents for shortness of breath as per above.  Physical exam is pertinent for tachypnea, prolonged expiration, diffuse wheezes and decreased breath sounds, tachycardia.   The differential includes but is not limited to COPD exacerbation, pneumonia, pneumothorax, ACS, metabolic derangement, heart failure, viral illness.  Independent historian: Spouse/partner  External data reviewed: {LSEXTERNALDATA:33407}  Initial Plan:  Screening labs including CBC and Metabolic panel to evaluate for infectious or metabolic etiology of disease.  BNP to evaluate for heart failure COVID/flu/RSV to evaluate for viral etiology of symptoms VBG to evaluate for presence/severity of hypercarbic respiratory failure Chest x-ray to evaluate for structural/infectious intra-thoracic pathology.  EKG and serial troponin  to evaluate for cardiac pathology. Objective evaluation as below reviewed   Labs: Ordered, Independent interpretation, and Details: ***  Radiology: Ordered, Independent interpretation, and Details: *** No results found.  EKG/Medicine tests: Ordered and Independent interpretation EKG Interpretation: Sinus tachycardia Anteroseptal infarct, age indeterminate Borderline ST depression, lateral leads Confirmed by Rogelia Satterfield ([id number]) on 12/14/2023 7:10:32 AM  Interventions: DuoNebs X3, Solu-Medrol ***   See the EMR for full details regarding lab and imaging results.  Patient initially presented to triage area, tachycardic, tachypneic, reportedly saturating in the 60s on room air, however I did not personally visualized this waveform, patient was brought back to room and placed on nonrebreather where I evaluated the patient, patient saturating in the mid 90s on nonrebreather, reports that his shortness of breath has  improved since being placed on oxygen.  Patient alert, oriented, protecting his airway, therefore we will proceed with DuoNebs and Solu-Medrol  for empiric treatment for COPD exacerbation given history of COPD and diffuse wheezing.  Initially patient warrants labs and imaging.  ***.  {LSCOPA:33420}  Discussion of management or test interpretations with external provider(s): ***  Risk Drugs:{LSDRUGS:33399} Treatment: {LSTREATMENT:33409} Surgery:{LSSURGERY:33410} Critical Care: ***  Disposition: {LSDISPO:33388}  MDM generated using voice dictation software and may contain dictation errors.  Please contact me for any clarification or with any questions.  Clinical Impression: No diagnosis found.   Data Unavailable   Final Clinical Impression(s) / ED Diagnoses Final diagnoses:  None    Rx / DC Orders ED Discharge Orders     None

## 2023-12-14 NOTE — H&P (Signed)
 History and Physical    Patient: [name] FMW:[id number] DOB: Jun 04, 1955 DOA: 12/14/2023 DOS: the patient was seen and examined on 12/14/2023 PCP: Patient, No Pcp Per  Patient coming from: Home  Chief Complaint:  Chief Complaint  Patient presents with   Respiratory Distress   HPI: [name] is a 68 y.o. male with medical history significant of asthma, COPD, history of tobacco abuse, hypertension, obesity, hyperlipidemia, colon polyps, history of pneumonia who was brought to the emergency department via private vehicle by a relative due to respiratory distress for about a week associated with occasionally productive cough of whitish sputum, wheezing and fatigue.  He denied fever, chills, rhinorrhea, sore throat or hemoptysis.  No chest pain, palpitations, diaphoresis, PND, orthopnea or pitting edema of the lower extremities.  No abdominal pain, nausea, emesis, diarrhea, constipation, melena or hematochezia.  No flank pain, dysuria, frequency or hematuria.  No polyuria, polydipsia, polyphagia or blurred vision.   Lab work: CBC showed a white count of 11.3, hemoglobin 16.5 g/dL and platelets 846.  proBNP was 138.0 pg/mL, first troponin was 39 and second troponin 38 ng/L.  CMP showed chloride of 97, glucose of 114 and total bilirubin of 1.5 mg/dL, the rest of the CMP measurements were normal.  The venous blood gas showed a normal pH, pCO2 of 61 and pO2 of less than 31 mmHg.  Bicarbonate was 34.0 and acid base excess was 6.1 mmol/L.  Coronavirus, influenza and RSV PCR test was negative.  Imaging: Portable 1 view chest radiograph showing left basilar hazy and patchy areas of opacification/consolidation.  He had a similar area of infiltrate on CT chest 6 days ago.   ED course: Initial vital signs were temperature 98.4 F, pulse 9025, respiration 26, BP 171/110 mmHg and O2 sat 67% on room air.  Patient was given supplemental oxygen, was placed on BiPAP ventilation mode, given  albuterol  10 mg nebs continuously, 3 DuoNebs after this, methylprednisolone  125 mg IVP x 1, ceftriaxone  2 g IVPB and azithromycin  500 mg IVPB.  Review of Systems: As mentioned in the history of present illness. All other systems reviewed and are negative. Past Medical History:  Diagnosis Date   Asthma    COPD (chronic obstructive pulmonary disease) (HCC)    Hx of adenomatous polyp of colon 01/13/2020   Hypertension    Past Surgical History:  Procedure Laterality Date   HAND SURGERY Bilateral left 8021,8008 Rt   Social History:  reports that he has been smoking cigarettes. He started smoking about a year ago. He has a 45.3 pack-year smoking history. He has never used smokeless tobacco. He reports current alcohol use of about 1.0 standard drink of alcohol per week. He reports current drug use. Frequency: 1.00 time per week. Drug: Marijuana.  Allergies  Allergen Reactions   Codeine Itching   Penicillins Itching and Other (See Comments)    Family History  Problem Relation Age of Onset   Hypertension Paternal Uncle    Cancer Maternal Grandmother    Hypertension Maternal Grandfather    Colon cancer Cousin    Colon polyps Neg Hx    Esophageal cancer Neg Hx    Stomach cancer Neg Hx    Rectal cancer Neg Hx     Prior to Admission medications   Medication Sig Start Date End Date Taking? Authorizing Provider  albuterol  (VENTOLIN  HFA) 108 (90 Base) MCG/ACT inhaler Inhale 2 puffs into the lungs every 6 (six) hours as needed for wheezing or shortness of breath. 09/17/23  Ruthell Lauraine FALCON, NP  albuterol  (VENTOLIN  HFA) 108 (90 Base) MCG/ACT inhaler Inhale 1- 2 puffs into the lungs every 4-6 hours (up to 6 times daily) as needed for shortness of breath or wheezing. 10/10/23     aspirin  EC 81 MG tablet Take 1 tablet by mouth in the morning daily. 12/20/22     aspirin  EC 81 MG tablet Take 1 tablet (81 mg total) by mouth in the morning for vascular and heart health . 10/10/23     atorvastatin  (LIPITOR)  40 MG tablet Take 1 tablet (40 mg total) by mouth daily. Please schedule PCP appt for more refills. 04/30/23   Newlin, Enobong, MD  atorvastatin  (LIPITOR) 40 MG tablet Take 1 tablet (40 mg total) by mouth daily for high cholesterol and heart health . 10/10/23   Arloa Jarvis, NP  budesonide -glycopyrrolate -formoterol  (BREZTRI  AEROSPHERE) 160-9-4.8 MCG/ACT AERO inhaler Inhale 2 puffs into the lungs in the morning and at bedtime. 08/25/23   Ruthell Lauraine FALCON, NP  budesonide -glycopyrrolate -formoterol  (BREZTRI  AEROSPHERE) 160-9-4.8 MCG/ACT AERO inhaler Inhale 2 puffs into the lungs in the morning and at bedtime. 09/17/23   Ruthell Lauraine FALCON, NP  budesonide -glycopyrrolate -formoterol  (BREZTRI  AEROSPHERE) 160-9-4.8 MCG/ACT AERO inhaler Inhale 2 puffs into the lungs in the morning AND 2 puffs every evening for COPD. 10/10/23     ergocalciferol  (VITAMIN D2) 1.25 MG (50000 UT) capsule Take 1 capsule by mouth once a week for 12 weeks. Patient not taking: Reported on 09/17/2023 12/20/22     fluticasone  (FLONASE ) 50 MCG/ACT nasal spray Place 2 sprays into both nostrils daily for seasonal allergies and flare up 10/10/23     furosemide  (LASIX ) 20 MG tablet Take 1 tablet (20 mg total) by mouth in the morning for high blood pressure and swelling. Patient not taking: Reported on 09/17/2023 01/03/23     furosemide  (LASIX ) 20 MG tablet Take 1 tablet (20 mg total) by mouth in the morning high blood pressure and swelling . 10/10/23     ipratropium-albuterol  (DUONEB) 0.5-2.5 (3) MG/3ML SOLN Take 3 mLs by nebulization every 6 (six) hours as needed (severe wheezing). 08/25/23   Ruthell Lauraine FALCON, NP  ipratropium-albuterol  (DUONEB) 0.5-2.5 (3) MG/3ML SOLN Take 3 mLs by nebulization in the morning, at noon, in the evening, and at bedtime as needed for moderate to severe shortness of breath, COPD flare up or wheezing 10/10/23     losartan  (COZAAR ) 25 MG tablet Take 1 tablet (25 mg total) by mouth daily. Please schedule PCP appt for more refills. 04/30/23    Newlin, Enobong, MD  losartan  (COZAAR ) 25 MG tablet Take 1 tablet (25 mg total) by mouth in the morning for high blood pressure 10/10/23     Misc. Devices MISC Nebulizer device.  Diagnosis COPD 05/15/22   Delbert Clam, MD  montelukast  (SINGULAIR ) 10 MG tablet Take 1 tablet (10 mg total) by mouth every evening for flare up and seasonal allergies. 10/10/23       Physical Exam: Vitals:   12/14/23 1007 12/14/23 1100 12/14/23 1125 12/14/23 1215  BP:  107/61  (!) 113/51  Pulse:  (!) 109 (!) 111 (!) 102  Resp:  (!) 26 (!) 25 (!) 23  Temp:      TempSrc:      SpO2: 94% 91% 95% 92%   Physical Exam Vitals and nursing note reviewed.  Constitutional:      General: He is awake. He is not in acute distress.    Appearance: He is obese. He is ill-appearing. He is not  diaphoretic.     Interventions: Nasal cannula in place.  HENT:     Head: Normocephalic.     Nose: No rhinorrhea.     Mouth/Throat:     Mouth: Mucous membranes are moist.  Eyes:     General: No scleral icterus.    Pupils: Pupils are equal, round, and reactive to light.  Neck:     Vascular: No JVD.  Cardiovascular:     Rate and Rhythm: Regular rhythm. Tachycardia present.     Heart sounds: S1 normal and S2 normal.  Pulmonary:     Effort: Tachypnea present. No accessory muscle usage.     Breath sounds: Decreased air movement present. Decreased breath sounds, wheezing and rhonchi present. No rales.  Abdominal:     General: Abdomen is protuberant. Bowel sounds are normal. There is no distension.     Palpations: Abdomen is soft.     Tenderness: There is no abdominal tenderness. There is no right CVA tenderness or left CVA tenderness.  Musculoskeletal:     Cervical back: Neck supple.     Right lower leg: No edema.     Left lower leg: No edema.  Skin:    General: Skin is warm and dry.  Neurological:     General: No focal deficit present.     Mental Status: He is alert and oriented to person, place, and time.  Psychiatric:         Mood and Affect: Mood normal.        Behavior: Behavior normal. Behavior is cooperative.     Data Reviewed:  Results are pending, will review when available. 05/11/2022 transthoracic echocardiogram report. IMPRESSIONS:   1. Left ventricular ejection fraction, by estimation, is 60 to 65%. The  left ventricle has normal function. The left ventricle has no regional  wall motion abnormalities. Left ventricular diastolic parameters are  consistent with Grade I diastolic  dysfunction (impaired relaxation).   2. Right ventricular systolic function is normal. The right ventricular  size is normal. There is mildly elevated pulmonary artery systolic  pressure.   3. The mitral valve is normal in structure. No evidence of mitral valve  regurgitation. No evidence of mitral stenosis.   4. The aortic valve has an indeterminant number of cusps. Aortic valve  regurgitation is not visualized. No aortic stenosis is present.   5. The inferior vena cava is dilated in size with <50% respiratory  variability, suggesting right atrial pressure of 15 mmHg.   EKG: Vent. rate 127 BPM PR interval 126 ms QRS duration 76 ms QT/QTcB 282/410 ms P-R-T axes 92 49 79 Sinus tachycardia Anteroseptal infarct, age indeterminate Borderline ST depression, lateral leads  Assessment and Plan: Principal Problem:   Acute respiratory failure with hypoxia (HCC) In the setting of:   CAP (community acquired pneumonia) With superimposed:   COPD exacerbation (HCC) Admit to SDU/inpatient. Continue supplemental oxygen. Scheduled and as needed bronchodilators. Continue on montelukast  10 mg p.o. bedtime. Continue ceftriaxone  2 g IVPB daily. Continue azithromycin  500 mg IVPB daily. Check strep pneumoniae urinary antigen. Check sputum Gram stain, culture and sensitivity. Follow-up blood culture and sensitivity. Follow-up CBC and chemistry in the morning.  Active Problems:   Grade I diastolic dysfunction No signs of  volume overload. Continue furosemide  20 mg p.o. daily. Continue losartan  25 mg p.o. daily. No beta-blocker ordered due to COPD history.    Tobacco abuse Tobacco cessation advised. Nicotine  replacement therapy declined at this time. -May be ordered as needed.  Essential hypertension Continue diuretic and ARB as above.    Hyperlipidemia Continue atorvastatin  40 mg p.o. daily.     Advance Care Planning:   Code Status: Full Code   Consults:   Family Communication:   Severity of Illness: The appropriate patient status for this patient is INPATIENT. Inpatient status is judged to be reasonable and necessary in order to provide the required intensity of service to ensure the patient's safety. The patient's presenting symptoms, physical exam findings, and initial radiographic and laboratory data in the context of their chronic comorbidities is felt to place them at high risk for further clinical deterioration. Furthermore, it is not anticipated that the patient will be medically stable for discharge from the hospital within 2 midnights of admission.   * I certify that at the point of admission it is my clinical judgment that the patient will require inpatient hospital care spanning beyond 2 midnights from the point of admission due to high intensity of service, high risk for further deterioration and high frequency of surveillance required.*  Author: Alm Dorn Castor, MD 12/14/2023 12:31 PM  For on call review [email or web address].  This document was prepared using Dragon voice recognition software and may contain some unintended transcription errors.

## 2023-12-15 DIAGNOSIS — J9601 Acute respiratory failure with hypoxia: Secondary | ICD-10-CM | POA: Diagnosis not present

## 2023-12-15 LAB — BLOOD CULTURE ID PANEL (REFLEXED) - BCID2

## 2023-12-15 LAB — RESPIRATORY PANEL BY PCR

## 2023-12-15 LAB — EXPECTORATED SPUTUM ASSESSMENT W GRAM STAIN, RFLX TO RESP C

## 2023-12-15 LAB — COMPREHENSIVE METABOLIC PANEL WITH GFR
ALT: 16 U/L (ref 0–44)
AST: 18 U/L (ref 15–41)
Albumin: 3.5 g/dL (ref 3.5–5.0)
Alkaline Phosphatase: 87 U/L (ref 38–126)
Anion gap: 4 — ABNORMAL LOW (ref 5–15)
BUN: 12 mg/dL (ref 8–23)
CO2: 33 mmol/L — ABNORMAL HIGH (ref 22–32)
Calcium: 8.8 mg/dL — ABNORMAL LOW (ref 8.9–10.3)
Chloride: 98 mmol/L (ref 98–111)
Creatinine, Ser: 0.99 mg/dL (ref 0.61–1.24)
GFR, Estimated: >60 mL/min (ref >60)
Glucose, Bld: 123 mg/dL — ABNORMAL HIGH (ref 70–99)
Potassium: 5 mmol/L (ref 3.5–5.1)
Sodium: 136 mmol/L (ref 135–145)
Total Bilirubin: 0.4 mg/dL (ref 0.0–1.2)
Total Protein: 6.3 g/dL — ABNORMAL LOW (ref 6.5–8.1)

## 2023-12-15 LAB — CBC
HCT: 47.1 % (ref 39.0–52.0)
Hemoglobin: 15.2 g/dL (ref 13.0–17.0)
MCH: 32.3 pg (ref 26.0–34.0)
MCHC: 32.3 g/dL (ref 30.0–36.0)
MCV: 100.2 fL — ABNORMAL HIGH (ref 80.0–100.0)
Platelets: 130 K/uL — ABNORMAL LOW (ref 150–400)
RBC: 4.7 MIL/uL (ref 4.22–5.81)
RDW: 14.1 % (ref 11.5–15.5)
WBC: 11.8 K/uL — ABNORMAL HIGH (ref 4.0–10.5)
nRBC: 0 % (ref 0.0–0.2)

## 2023-12-15 LAB — HIV ANTIBODY (ROUTINE TESTING W REFLEX): HIV Screen 4th Generation wRfx: NONREACTIVE

## 2023-12-15 MED ORDER — ORAL CARE MOUTH RINSE: 15.0000 mL | OROMUCOSAL | Status: DC | PRN

## 2023-12-15 MED ORDER — ARFORMOTEROL TARTRATE 15 MCG/2ML IN NEBU
15.0000 ug | INHALATION_SOLUTION | Freq: Two times a day (BID) | RESPIRATORY_TRACT | Status: DC
  Administered 2023-12-15 – 2023-12-19 (×9): 15 ug via RESPIRATORY_TRACT
  Filled 2023-12-15 (×9): qty 2

## 2023-12-15 MED ORDER — ACETAZOLAMIDE SODIUM 500 MG IJ SOLR
500.0000 mg | Freq: Once | INTRAMUSCULAR | Status: AC
  Administered 2023-12-15: 500 mg via INTRAVENOUS
  Filled 2023-12-15: qty 500

## 2023-12-15 MED ORDER — REVEFENACIN 175 MCG/3ML IN SOLN
175.0000 ug | Freq: Every day | RESPIRATORY_TRACT | Status: DC
  Administered 2023-12-15 – 2023-12-19 (×5): 175 ug via RESPIRATORY_TRACT
  Filled 2023-12-15 (×5): qty 3

## 2023-12-15 MED ORDER — BUDESONIDE 0.25 MG/2ML IN SUSP
0.2500 mg | Freq: Two times a day (BID) | RESPIRATORY_TRACT | Status: DC
  Administered 2023-12-15 – 2023-12-16 (×3): 0.25 mg via RESPIRATORY_TRACT
  Filled 2023-12-15 (×3): qty 2

## 2023-12-15 MED ORDER — STERILE WATER FOR INJECTION IJ SOLN
INTRAMUSCULAR | Status: AC
  Administered 2023-12-15: 10 mL
  Filled 2023-12-15: qty 10

## 2023-12-15 MED ORDER — GUAIFENESIN ER 600 MG PO TB12
600.0000 mg | ORAL_TABLET | Freq: Two times a day (BID) | ORAL | Status: DC
  Administered 2023-12-15 – 2023-12-19 (×9): 600 mg via ORAL
  Filled 2023-12-15 (×9): qty 1

## 2023-12-15 NOTE — Progress Notes (Signed)
 PHARMACY - PHYSICIAN COMMUNICATION CRITICAL VALUE ALERT - BLOOD CULTURE IDENTIFICATION (BCID)  [name] is an 68 y.o. male who presented to [facility] on 12/14/2023 with a chief complaint of respiratory distress.  Assessment: Pt currently on antibiotics for CAP/COPD exacerbation.   BCID + 1/2 staph species  Name of physician (or Provider) Contacted: Dr. Jillian  Current antibiotics: Ceftriaxone  + azithromycin   Changes to prescribed antibiotics: Possible contaminant, continue with current antibiotics. MD repeating blood cultures.    Results for orders placed or performed during the hospital encounter of 12/14/23  Blood Culture ID Panel (Reflexed) (Collected: 12/14/2023 12:37 PM)  Result Value Ref Range   Enterococcus faecalis NOT DETECTED NOT DETECTED   Enterococcus Faecium NOT DETECTED NOT DETECTED   Listeria monocytogenes NOT DETECTED NOT DETECTED   Staphylococcus species DETECTED (A) NOT DETECTED   Staphylococcus aureus (BCID) NOT DETECTED NOT DETECTED   Staphylococcus epidermidis NOT DETECTED NOT DETECTED   Staphylococcus lugdunensis NOT DETECTED NOT DETECTED   Streptococcus species NOT DETECTED NOT DETECTED   Streptococcus agalactiae NOT DETECTED NOT DETECTED   Streptococcus pneumoniae NOT DETECTED NOT DETECTED   Streptococcus pyogenes NOT DETECTED NOT DETECTED   A.calcoaceticus-baumannii NOT DETECTED NOT DETECTED   Bacteroides fragilis NOT DETECTED NOT DETECTED   Enterobacterales NOT DETECTED NOT DETECTED   Enterobacter cloacae complex NOT DETECTED NOT DETECTED   Escherichia coli NOT DETECTED NOT DETECTED   Klebsiella aerogenes NOT DETECTED NOT DETECTED   Klebsiella oxytoca NOT DETECTED NOT DETECTED   Klebsiella pneumoniae NOT DETECTED NOT DETECTED   Proteus species NOT DETECTED NOT DETECTED   Salmonella species NOT DETECTED NOT DETECTED   Serratia marcescens NOT DETECTED NOT DETECTED   Haemophilus influenzae NOT DETECTED NOT DETECTED   Neisseria  meningitidis NOT DETECTED NOT DETECTED   Pseudomonas aeruginosa NOT DETECTED NOT DETECTED   Stenotrophomonas maltophilia NOT DETECTED NOT DETECTED   Candida albicans NOT DETECTED NOT DETECTED   Candida auris NOT DETECTED NOT DETECTED   Candida glabrata NOT DETECTED NOT DETECTED   Candida krusei NOT DETECTED NOT DETECTED   Candida parapsilosis NOT DETECTED NOT DETECTED   Candida tropicalis NOT DETECTED NOT DETECTED   Cryptococcus neoformans/gattii NOT DETECTED NOT DETECTED    Ronal CHRISTELLA Rav, PharmD 12/15/2023  2:47 PM

## 2023-12-15 NOTE — Progress Notes (Signed)
 PROGRESS NOTE  Simeon Vera  FMW:[id number] DOB: 1955/10/06 DOA: 12/14/2023 PCP: Patient, No Pcp Per   Brief Narrative: Patient is a 68 year old male with history of asthma/COPD, diverticulosis, hypertension, obesity, hyperlipidemia who presented with complaint of shortness of breath, productive cough some fatigue, wheezing.  On presentation, labs showed mild leukocytosis, elevated troponin with flat trend.  Venous gas showed pCO2 of 61.  COVID/flu/influenza negative.  Chest x-ray showed left basilar hazy and patchy areas of opacification/consolidation.  Patient admitted for further management of acute hypoxic respiratory failure secondary to community-acquired pneumonia/COPD exacerbation.  Started on antibiotics, culture sent.  On high flow oxygen this morning.  ABG showed hypercarbia/acidosis.  PCCM consulted  Assessment & Plan:  Principal Problem:   Acute respiratory failure with hypoxia (HCC) Active Problems:   Tobacco abuse   Essential hypertension   CAP (community acquired pneumonia)   Hyperlipidemia   COPD exacerbation (HCC)   Grade I diastolic dysfunction  Acute hypoxic/hypercarbic respiratory failure secondary to community-acquired pneumonia/COPD exacerbation: Presented with fatigue, dyspnea, productive cough.  ABG shows elevated pCO2.  Started on BiPAP.  Patient currently not tolerating.  On high flow nasal cannula now. Continue broad-spectrum antibiotics.  He is not on oxygen at home.  Continue mucolytic's.   Follow-up sputum culture/blood culture.  Procalcitonin of 0.48.  Has mild leukocytosis.  Streptococcal pneumoniae urine antigen negative. Respiratory viral panel negative. PCCM consulted.  Continue BiPAP at least at night.  History of HFpEF: Appears euvolemic on presentation.  On Lasix , losartan  at home.Continue the same  Elevated troponin: Flat trend.  No anginal symptoms.  Likely from demand ischemia.  No chest pain this morning  Continued tobacco use: Counseled  for cessation.  Has recently cut down to 6 cigarettes a day  Hypertension: On losartan   Hyperlipidemia: On Lipitor  Obesity: BMI of  32.6         DVT prophylaxis:enoxaparin  (LOVENOX ) injection 40 mg Start: 12/14/23 2200     Code Status: Full Code  Family Communication: Significant other Antionial on phone  Patient status:Inatient  Patient is from :Home  Anticipated discharge un:ynfz  Estimated DC date:2-3 days   Consultants: PCCM  Procedures:None  Antimicrobials:  Anti-infectives (From admission, onward)    Start     Dose/Rate Route Frequency Ordered Stop   12/15/23 1400  cefTRIAXone  (ROCEPHIN ) 2 g in sodium chloride  0.9 % 100 mL IVPB        2 g 200 mL/hr over 30 Minutes Intravenous Every 24 hours 12/14/23 1258 12/19/23 1359   12/15/23 1400  azithromycin  (ZITHROMAX ) 500 mg in sodium chloride  0.9 % 250 mL IVPB        500 mg 250 mL/hr over 60 Minutes Intravenous Every 24 hours 12/14/23 1258 12/19/23 1359   12/14/23 1230  cefTRIAXone  (ROCEPHIN ) 2 g in sodium chloride  0.9 % 100 mL IVPB        2 g 200 mL/hr over 30 Minutes Intravenous  Once 12/14/23 1223 12/14/23 2243   12/14/23 1230  azithromycin  (ZITHROMAX ) 500 mg in sodium chloride  0.9 % 250 mL IVPB        500 mg 250 mL/hr over 60 Minutes Intravenous  Once 12/14/23 1223 12/14/23 1801       Subjective: Patient seen and examined at bedside today.  Hemodynamically stable.  8 to 10 L of oxygen high flow.  Could not tolerate BiPAP.  During my evaluation, he looks overall comfortable.  Speaking in full sentences.  Denies any worsening shortness of breath or cough.  Not using  accessory muscles of respiration.  ABG this morning showed hypercarbia, acidosis  Objective: Vitals:   12/15/23 0500 12/15/23 0622 12/15/23 0700 12/15/23 0723  BP:  (!) 144/54 (!) 167/35   Pulse: (!) 110 (!) 111 (!) 110   Resp: (!) 33 (!) 23 (!) 26   Temp:    99.9 F (37.7 C)  TempSrc:    Oral  SpO2: 100% 97% 99%   Weight:      Height:         Intake/Output Summary (Last 24 hours) at 12/15/2023 0744 Last data filed at 12/15/2023 0530 Gross per 24 hour  Intake 396.29 ml  Output 750 ml  Net -353.71 ml   Filed Weights   12/14/23 1348  Weight: 100.3 kg    Examination:  General exam: Overall comfortable, not in distress, obese HEENT: PERRL Respiratory system: Diminished air sound bilaterally, no significant wheezing or crackles Cardiovascular system: S1 & S2 heard, RRR.  Gastrointestinal system: Abdomen is nondistended, soft and nontender. Central nervous system: Alert and oriented Extremities: No edema, no clubbing ,no cyanosis Skin: No rashes, no ulcers,no icterus     Data Reviewed: I have personally reviewed following labs and imaging studies  CBC: Recent Labs  Lab 12/14/23 0705 12/15/23 0247  WBC 11.3* 11.8*  NEUTROABS 8.5*  --   HGB 16.5 15.2  HCT 50.2 47.1  MCV 98.0 100.2*  PLT 153 130*   Basic Metabolic Panel: Recent Labs  Lab 12/14/23 0705 12/15/23 0247  NA 136 136  K 3.9 5.0  CL 97* 98  CO2 31 33*  GLUCOSE 114* 123*  BUN 9 12  CREATININE 1.00 0.99  CALCIUM  9.3 8.8*  MG 2.1  --   PHOS 3.1  --      Recent Results (from the past 240 hours)  Resp panel by RT-PCR (RSV, Flu A&B, Covid) Anterior Nasal Swab     Status: None   Collection Time: 12/14/23  7:35 AM   Specimen: Anterior Nasal Swab  Result Value Ref Range Status   SARS Coronavirus 2 by RT PCR NEGATIVE NEGATIVE Final    Comment: (NOTE) SARS-CoV-2 target nucleic acids are NOT DETECTED.  The SARS-CoV-2 RNA is generally detectable in upper respiratory specimens during the acute phase of infection. The lowest concentration of SARS-CoV-2 viral copies this assay can detect is 138 copies/mL. A negative result does not preclude SARS-Cov-2 infection and should not be used as the sole basis for treatment or other patient management decisions. A negative result may occur with  improper specimen collection/handling, submission of  specimen other than nasopharyngeal swab, presence of viral mutation(s) within the areas targeted by this assay, and inadequate number of viral copies(<138 copies/mL). A negative result must be combined with clinical observations, patient history, and epidemiological information. The expected result is Negative.  Fact Sheet for Patients:  bloggercourse.com  Fact Sheet for Healthcare Providers:  seriousbroker.it  This test is no t yet approved or cleared by the [place]  FDA and  has been authorized for detection and/or diagnosis of SARS-CoV-2 by FDA under an Emergency Use Authorization (EUA). This EUA will remain  in effect (meaning this test can be used) for the duration of the COVID-19 declaration under Section 564(b)(1) of the Act, 21 U.S.C.section 360bbb-3(b)(1), unless the authorization is terminated  or revoked sooner.       Influenza A by PCR NEGATIVE NEGATIVE Final   Influenza B by PCR NEGATIVE NEGATIVE Final    Comment: (NOTE) The Xpert Xpress SARS-CoV-2/FLU/RSV plus assay  is intended as an aid in the diagnosis of influenza from Nasopharyngeal swab specimens and should not be used as a sole basis for treatment. Nasal washings and aspirates are unacceptable for Xpert Xpress SARS-CoV-2/FLU/RSV testing.  Fact Sheet for Patients: bloggercourse.com  Fact Sheet for Healthcare Providers: seriousbroker.it  This test is not yet approved or cleared by the [place]  FDA and has been authorized for detection and/or diagnosis of SARS-CoV-2 by FDA under an Emergency Use Authorization (EUA). This EUA will remain in effect (meaning this test can be used) for the duration of the COVID-19 declaration under Section 564(b)(1) of the Act, 21 U.S.C. section 360bbb-3(b)(1), unless the authorization is terminated or revoked.     Resp Syncytial Virus by PCR NEGATIVE NEGATIVE Final     Comment: (NOTE) Fact Sheet for Patients: bloggercourse.com  Fact Sheet for Healthcare Providers: seriousbroker.it  This test is not yet approved or cleared by the [place]  FDA and has been authorized for detection and/or diagnosis of SARS-CoV-2 by FDA under an Emergency Use Authorization (EUA). This EUA will remain in effect (meaning this test can be used) for the duration of the COVID-19 declaration under Section 564(b)(1) of the Act, 21 U.S.C. section 360bbb-3(b)(1), unless the authorization is terminated or revoked.  Performed at [facility], [address], [place], [place] [address]      Radiology Studies: DG Chest Portable 1 View Result Date: 12/14/2023 EXAM: 1 VIEW XRAY OF THE CHEST 12/14/2023 07:25:00 AM COMPARISON: 12/12/2022 CLINICAL HISTORY: SOB FINDINGS: LUNGS AND PLEURA: Left basilar patchy opacities. No pleural effusion. No pneumothorax. HEART AND MEDIASTINUM: No acute abnormality of the cardiac and mediastinal silhouettes. BONES AND SOFT TISSUES: No acute osseous abnormality. IMPRESSION: 1. Left basilar hazy and patchy areas of opacification/consolidation . Electronically signed by: Evalene Coho MD 12/14/2023 07:35 AM EST RP Workstation: [id number]    Scheduled Meds:  aspirin  EC  81 mg Oral Daily   atorvastatin   40 mg Oral Daily   Chlorhexidine  Gluconate Cloth  6 each Topical Daily   enoxaparin  (LOVENOX ) injection  40 mg Subcutaneous Q24H   feeding supplement  237 mL Oral BID BM   furosemide   20 mg Oral Daily   ipratropium-albuterol   3 mL Nebulization QID   losartan   25 mg Oral Daily   montelukast   10 mg Oral QHS   predniSONE   40 mg Oral Q breakfast   Continuous Infusions:  azithromycin      cefTRIAXone  (ROCEPHIN )  IV       LOS: 1 day   Ivonne Mustache, MD [facility] P12/01/2023, 7:44 AM  PROGRESS NOTE  Zavion Sleight  FMW:[id number] DOB: Mar 11, 1955 DOA:  12/14/2023 PCP: Patient, No Pcp Per   Brief Narrative:   Assessment & Plan:  Principal Problem:   Acute respiratory failure with hypoxia (HCC) Active Problems:   Tobacco abuse   Essential hypertension   CAP (community acquired pneumonia)   Hyperlipidemia   COPD exacerbation (HCC)   Grade I diastolic dysfunction           DVT prophylaxis:enoxaparin  (LOVENOX ) injection 40 mg Start: 12/14/23 2200     Code Status: Full Code  Family Communication:   Patient status:  Patient is from :  Anticipated discharge to:  Estimated DC date:   Consultants:   Procedures:  Antimicrobials:  Anti-infectives (From admission, onward)    Start     Dose/Rate Route Frequency Ordered Stop   12/15/23 1400  cefTRIAXone  (ROCEPHIN ) 2 g in sodium chloride  0.9 % 100 mL IVPB  2 g 200 mL/hr over 30 Minutes Intravenous Every 24 hours 12/14/23 1258 12/19/23 1359   12/15/23 1400  azithromycin  (ZITHROMAX ) 500 mg in sodium chloride  0.9 % 250 mL IVPB        500 mg 250 mL/hr over 60 Minutes Intravenous Every 24 hours 12/14/23 1258 12/19/23 1359   12/14/23 1230  cefTRIAXone  (ROCEPHIN ) 2 g in sodium chloride  0.9 % 100 mL IVPB        2 g 200 mL/hr over 30 Minutes Intravenous  Once 12/14/23 1223 12/14/23 2243   12/14/23 1230  azithromycin  (ZITHROMAX ) 500 mg in sodium chloride  0.9 % 250 mL IVPB        500 mg 250 mL/hr over 60 Minutes Intravenous  Once 12/14/23 1223 12/14/23 1801       Subjective:   Objective: Vitals:   12/15/23 0500 12/15/23 0622 12/15/23 0700 12/15/23 0723  BP:  (!) 144/54 (!) 167/35   Pulse: (!) 110 (!) 111 (!) 110   Resp: (!) 33 (!) 23 (!) 26   Temp:    99.9 F (37.7 C)  TempSrc:    Oral  SpO2: 100% 97% 99%   Weight:      Height:        Intake/Output Summary (Last 24 hours) at 12/15/2023 0744 Last data filed at 12/15/2023 0530 Gross per 24 hour  Intake 396.29 ml  Output 750 ml  Net -353.71 ml   Filed Weights   12/14/23 1348  Weight: 100.3 kg     Examination:  General exam: Overall comfortable, not in distress HEENT: PERRL Respiratory system:  no wheezes or crackles  Cardiovascular system: S1 & S2 heard, RRR.  Gastrointestinal system: Abdomen is nondistended, soft and nontender. Central nervous system: Alert and oriented Extremities: No edema, no clubbing ,no cyanosis Skin: No rashes, no ulcers,no icterus     Data Reviewed: I have personally reviewed following labs and imaging studies  CBC: Recent Labs  Lab 12/14/23 0705 12/15/23 0247  WBC 11.3* 11.8*  NEUTROABS 8.5*  --   HGB 16.5 15.2  HCT 50.2 47.1  MCV 98.0 100.2*  PLT 153 130*   Basic Metabolic Panel: Recent Labs  Lab 12/14/23 0705 12/15/23 0247  NA 136 136  K 3.9 5.0  CL 97* 98  CO2 31 33*  GLUCOSE 114* 123*  BUN 9 12  CREATININE 1.00 0.99  CALCIUM  9.3 8.8*  MG 2.1  --   PHOS 3.1  --      Recent Results (from the past 240 hours)  Resp panel by RT-PCR (RSV, Flu A&B, Covid) Anterior Nasal Swab     Status: None   Collection Time: 12/14/23  7:35 AM   Specimen: Anterior Nasal Swab  Result Value Ref Range Status   SARS Coronavirus 2 by RT PCR NEGATIVE NEGATIVE Final    Comment: (NOTE) SARS-CoV-2 target nucleic acids are NOT DETECTED.  The SARS-CoV-2 RNA is generally detectable in upper respiratory specimens during the acute phase of infection. The lowest concentration of SARS-CoV-2 viral copies this assay can detect is 138 copies/mL. A negative result does not preclude SARS-Cov-2 infection and should not be used as the sole basis for treatment or other patient management decisions. A negative result may occur with  improper specimen collection/handling, submission of specimen other than nasopharyngeal swab, presence of viral mutation(s) within the areas targeted by this assay, and inadequate number of viral copies(<138 copies/mL). A negative result must be combined with clinical observations, patient history, and  epidemiological information. The expected  result is Negative.  Fact Sheet for Patients:  bloggercourse.com  Fact Sheet for Healthcare Providers:  seriousbroker.it  This test is no t yet approved or cleared by the [place]  FDA and  has been authorized for detection and/or diagnosis of SARS-CoV-2 by FDA under an Emergency Use Authorization (EUA). This EUA will remain  in effect (meaning this test can be used) for the duration of the COVID-19 declaration under Section 564(b)(1) of the Act, 21 U.S.C.section 360bbb-3(b)(1), unless the authorization is terminated  or revoked sooner.       Influenza A by PCR NEGATIVE NEGATIVE Final   Influenza B by PCR NEGATIVE NEGATIVE Final    Comment: (NOTE) The Xpert Xpress SARS-CoV-2/FLU/RSV plus assay is intended as an aid in the diagnosis of influenza from Nasopharyngeal swab specimens and should not be used as a sole basis for treatment. Nasal washings and aspirates are unacceptable for Xpert Xpress SARS-CoV-2/FLU/RSV testing.  Fact Sheet for Patients: bloggercourse.com  Fact Sheet for Healthcare Providers: seriousbroker.it  This test is not yet approved or cleared by the [place]  FDA and has been authorized for detection and/or diagnosis of SARS-CoV-2 by FDA under an Emergency Use Authorization (EUA). This EUA will remain in effect (meaning this test can be used) for the duration of the COVID-19 declaration under Section 564(b)(1) of the Act, 21 U.S.C. section 360bbb-3(b)(1), unless the authorization is terminated or revoked.     Resp Syncytial Virus by PCR NEGATIVE NEGATIVE Final    Comment: (NOTE) Fact Sheet for Patients: bloggercourse.com  Fact Sheet for Healthcare Providers: seriousbroker.it  This test is not yet approved or cleared by the [place]  FDA and has been  authorized for detection and/or diagnosis of SARS-CoV-2 by FDA under an Emergency Use Authorization (EUA). This EUA will remain in effect (meaning this test can be used) for the duration of the COVID-19 declaration under Section 564(b)(1) of the Act, 21 U.S.C. section 360bbb-3(b)(1), unless the authorization is terminated or revoked.  Performed at [facility], [address] [place], [place] [address]      Radiology Studies: DG Chest Portable 1 View Result Date: 12/14/2023 EXAM: 1 VIEW XRAY OF THE CHEST 12/14/2023 07:25:00 AM COMPARISON: 12/12/2022 CLINICAL HISTORY: SOB FINDINGS: LUNGS AND PLEURA: Left basilar patchy opacities. No pleural effusion. No pneumothorax. HEART AND MEDIASTINUM: No acute abnormality of the cardiac and mediastinal silhouettes. BONES AND SOFT TISSUES: No acute osseous abnormality. IMPRESSION: 1. Left basilar hazy and patchy areas of opacification/consolidation . Electronically signed by: Timothy Berrigan MD 12/14/2023 07:35 AM EST RP Workstation: [id number]    Scheduled Meds:  aspirin  EC  81 mg Oral Daily   atorvastatin   40 mg Oral Daily   Chlorhexidine Gluconate Cloth  6 each Topical Daily   enoxaparin  (LOVENOX ) injection  40 mg Subcutaneous Q24H   feeding supplement  237 mL Oral BID BM   furosemide   20 mg Oral Daily   ipratropium-albuterol   3 mL Nebulization QID   losartan   25 mg Oral Daily   montelukast   10 mg Oral QHS   predniSONE   40 mg Oral Q breakfast   Continuous Infusions:  azithromycin      cefTRIAXone  (ROCEPHIN )  IV       LOS: 1 day   Ivonne Mustache, MD [facility] P12/01/2023, 7:44 AM

## 2023-12-15 NOTE — Consult Note (Signed)
 NAME:  [name], MRN:  [id number], DOB:  11-20-1955, LOS: 1 ADMISSION DATE:  12/14/2023, CONSULTATION DATE:  12/15/23 REFERRING MD:  TRH, CHIEF COMPLAINT:  Hypercapnia   History of Present Illness:  [name] is a 68 year old male with a past medical history significant for COPD, asthma, tobacco use, HTN, HLD, obesity, colon polyps presented to the ED at [facility] 11/30 for acute respiratory distress.  Patient states symptoms began 1 week prior to admission and reports productive cough, wheeze, and fatigue.  Workup on admission concerning for acute COPD exacerbation in the setting of community-acquired pneumonia.  Patient was admitted per hospitalist.  Morning of 12/1 patient remains hypercapnic prompting PCCM consult  Pertinent  Medical History  COPD, asthma, tobacco use, HTN, HLD, obesity, colon polyps  Significant Hospital Events: Including procedures, antibiotic start and stop dates in addition to other pertinent events   11/30 presented with respiratory distress admitted for AECOPD and CAP   12/1 On 11L HFNC, ABG with PH 7.29, PCO2 79, and Bicarb 37.9  Interim History / Subjective:  Seen sitting up in edge of bed with no acute complaints, states breathing is much improved   Objective    Blood pressure (!) 162/80, pulse (!) 115, temperature 99.9 F (37.7 C), temperature source Oral, resp. rate 14, height 5' 9 (1.753 m), weight 100.3 kg, SpO2 94%.    FiO2 (%):  [32 %-40 %] 32 % PEEP:  [6 cmH20] 6 cmH20 Pressure Support:  [12 cmH20] 12 cmH20   Intake/Output Summary (Last 24 hours) at 12/15/2023 0840 Last data filed at 12/15/2023 0530 Gross per 24 hour  Intake 396.29 ml  Output 750 ml  Net -353.71 ml   Filed Weights   12/14/23 1348  Weight: 100.3 kg    Examination: General: Acute on chronically ill appearing middle aged male sitting up on edge of bed, in NAD HEENT: [place]/AT, MM pink/moist, PERRL,  Neuro: Alert and oriented x3, non-focal  CV: s1s2  regular rate and rhythm, no murmur, rubs, or gallops,  PULM:  Slightly diminished bilaterally, no wheezing, no added breath sounds, on 11L HFNC  GI: soft, bowel sounds active in all 4 quadrants, non-tender, non-distended, tolerating oral diet Extremities: warm/dry, no edema  Skin: no rashes or lesions  Resolved problem list   Assessment and Plan  Acute hypoxic and hypercapnic respiratory failure  -ABG am 12/1 with PH 7.29, PCO2 79, and Bicarb 37.9. Patient remains on 11L HFNC with oxygen saturations 92-93% Community acquire pneumonia, POA -CXR with concerns of LLL consolidation  Acute exacerbation of chronic COPD, POA -Patient states he was visiting with fmaily for the holiday and when they all started to smoke it triggered me  -Reports he uses his recuse albuterol  upward of 6 times per days and often uses his scheduled Breztri  whenever I need it  Daily tobacco use  -States he smoke 6 cigarettes per day  P: Continue supplemental oxygen for sat goal greater than 88%, wean as able  Diamox  500mg  x1 Start triple neb therapy  Check RVP panel  Sputum culture  As needed BiPAP currently awake and orriented x3 with POC2 76 Diureses as needed  Aspiration precautions  Mobilize   Labs   CBC: Recent Labs  Lab 12/14/23 0705 12/15/23 0247  WBC 11.3* 11.8*  NEUTROABS 8.5*  --   HGB 16.5 15.2  HCT 50.2 47.1  MCV 98.0 100.2*  PLT 153 130*    Basic Metabolic Panel: Recent Labs  Lab 12/14/23 0705 12/15/23  0247  NA 136 136  K 3.9 5.0  CL 97* 98  CO2 31 33*  GLUCOSE 114* 123*  BUN 9 12  CREATININE 1.00 0.99  CALCIUM  9.3 8.8*  MG 2.1  --   PHOS 3.1  --    GFR: Estimated Creatinine Clearance: 84.5 mL/min (by C-G formula based on SCr of 0.99 mg/dL). Recent Labs  Lab 12/14/23 0705 12/15/23 0247  PROCALCITON 0.48  --   WBC 11.3* 11.8*    Liver Function Tests: Recent Labs  Lab 12/14/23 0705 12/15/23 0247  AST 29 18  ALT 18 16  ALKPHOS 105 87  BILITOT 1.5* 0.4   PROT 7.0 6.3*  ALBUMIN 4.1 3.5   No results for input(s): LIPASE, AMYLASE in the last 168 hours. No results for input(s): AMMONIA in the last 168 hours.  ABG    Component Value Date/Time   PHART 7.29 (L) 12/15/2023 0810   PCO2ART 79 (HH) 12/15/2023 0810   PO2ART 91 12/15/2023 0810   HCO3 37.9 (H) 12/15/2023 0810   TCO2 29 07/03/2009 1356   O2SAT 98.6 12/15/2023 0810     Coagulation Profile: No results for input(s): INR, PROTIME in the last 168 hours.  Cardiac Enzymes: No results for input(s): CKTOTAL, CKMB, CKMBINDEX, TROPONINI in the last 168 hours.  HbA1C: Hgb A1c MFr Bld  Date/Time Value Ref Range Status  12/03/2022 03:56 PM CANCELED %     Comment:    Test not performed. No lavender top tube submitted.          Prediabetes: 5.7 - 6.4          Diabetes: >6.4          Glycemic control for adults with diabetes: <7.0  Result canceled by the ancillary.   04/27/2020 04:59 PM 5.9 (H) 4.8 - 5.6 % Final    Comment:             Prediabetes: 5.7 - 6.4          Diabetes: >6.4          Glycemic control for adults with diabetes: <7.0     CBG: No results for input(s): GLUCAP in the last 168 hours.  Review of Systems:   Please see the history of present illness. All other systems reviewed and are negative   Past Medical History:  He,  has a past medical history of Asthma, COPD (chronic obstructive pulmonary disease) (HCC), adenomatous polyp of colon (01/13/2020), Hyperlipidemia (05/22/2022), Hypertension, and Obesity (02/04/2012).   Surgical History:   Past Surgical History:  Procedure Laterality Date   HAND SURGERY Bilateral left 8021,8008 Rt     Social History:   reports that he has been smoking cigarettes. He started smoking about a year ago. He has a 45.3 pack-year smoking history. He has never used smokeless tobacco. He reports current alcohol use of about 1.0 standard drink of alcohol per week. He reports current drug use. Frequency: 1.00  time per week. Drug: Marijuana.   Family History:  His family history includes Cancer in his maternal grandmother; Colon cancer in his cousin; Hypertension in his maternal grandfather and paternal uncle. There is no history of Colon polyps, Esophageal cancer, Stomach cancer, or Rectal cancer.   Allergies Allergies  Allergen Reactions   Codeine Itching   Penicillins Itching and Other (See Comments)     Home Medications  Prior to Admission medications   Medication Sig Start Date End Date Taking? Authorizing Provider  albuterol  (VENTOLIN  HFA) 108 (90 Base)  MCG/ACT inhaler Inhale 2 puffs into the lungs every 6 (six) hours as needed for wheezing or shortness of breath. 09/17/23   Ruthell Lauraine FALCON, NP  albuterol  (VENTOLIN  HFA) 108 (90 Base) MCG/ACT inhaler Inhale 1- 2 puffs into the lungs every 4-6 hours (up to 6 times daily) as needed for shortness of breath or wheezing. 10/10/23     aspirin  EC 81 MG tablet Take 1 tablet by mouth in the morning daily. 12/20/22     aspirin  EC 81 MG tablet Take 1 tablet (81 mg total) by mouth in the morning for vascular and heart health . 10/10/23     atorvastatin  (LIPITOR) 40 MG tablet Take 1 tablet (40 mg total) by mouth daily for high cholesterol and heart health . 10/10/23   Arloa Jarvis, NP  budesonide -glycopyrrolate -formoterol  (BREZTRI  AEROSPHERE) 160-9-4.8 MCG/ACT AERO inhaler Inhale 2 puffs into the lungs in the morning and at bedtime. 08/25/23   Ruthell Lauraine FALCON, NP  budesonide -glycopyrrolate -formoterol  (BREZTRI  AEROSPHERE) 160-9-4.8 MCG/ACT AERO inhaler Inhale 2 puffs into the lungs in the morning AND 2 puffs every evening for COPD. 10/10/23     fluticasone  (FLONASE ) 50 MCG/ACT nasal spray Place 2 sprays into both nostrils daily for seasonal allergies and flare up 10/10/23     furosemide  (LASIX ) 20 MG tablet Take 1 tablet (20 mg total) by mouth in the morning for high blood pressure and swelling. Patient not taking: Reported on 09/17/2023 01/03/23     furosemide   (LASIX ) 20 MG tablet Take 1 tablet (20 mg total) by mouth in the morning high blood pressure and swelling . 10/10/23     ipratropium-albuterol  (DUONEB) 0.5-2.5 (3) MG/3ML SOLN Take 3 mLs by nebulization in the morning, at noon, in the evening, and at bedtime as needed for moderate to severe shortness of breath, COPD flare up or wheezing 10/10/23     losartan  (COZAAR ) 25 MG tablet Take 1 tablet (25 mg total) by mouth in the morning for high blood pressure 10/10/23     montelukast  (SINGULAIR ) 10 MG tablet Take 1 tablet (10 mg total) by mouth every evening for flare up and seasonal allergies. 10/10/23        Critical care time: NA  Psalms Olarte D. Harris, NP-C [facility] Personal contact information can be found on Amion  If no contact or response made please call [phone] 12/15/2023, 9:34 AM

## 2023-12-15 NOTE — Plan of Care (Signed)
  Problem: Respiratory: Goal: Ability to maintain adequate ventilation will improve Outcome: Progressing   Problem: Respiratory: Goal: Ability to maintain a clear airway will improve Outcome: Progressing   Problem: Clinical Measurements: Goal: Ability to maintain clinical measurements within normal limits will improve Outcome: Progressing   Problem: Activity: Goal: Ability to tolerate increased activity will improve Outcome: Not Progressing   Problem: Respiratory: Goal: Levels of oxygenation will improve Outcome: Not Progressing   Problem: Activity: Goal: Ability to tolerate increased activity will improve Outcome: Not Progressing   Problem: Respiratory: Goal: Ability to maintain adequate ventilation will improve Outcome: Not Progressing

## 2023-12-15 NOTE — Progress Notes (Signed)
 Patient noted to be non-compliant with BP cuff throughout night. Patient educated multiple times. Continues to remove. BPs normal.

## 2023-12-15 NOTE — Progress Notes (Signed)
 Chaplains received a consult to provide [name] with assistance in creating advance directives.  I brought him the paperwork and he stated that for now he just wants to look it over.  If further questions or additional needs arise, please consult us  again or page us  at [phone].

## 2023-12-15 NOTE — Plan of Care (Signed)
 Respiratory effort has improved substantially, patient frequently removes and leaves his nasal cannula off and his SpO2 maintains high 80's to mid 90's.  He hasn't exhibited symptoms of hypercapnia despite his ABG showing CO2 of 78.  Has been up to in chair all day, eating well, in good spirits.

## 2023-12-15 NOTE — Progress Notes (Signed)
   12/15/23 2220  BiPAP/CPAP/SIPAP  BiPAP/CPAP/SIPAP Pt Type Adult  BiPAP/CPAP/SIPAP SERVO  Mask Type Full face mask  Dentures removed? Not applicable  Mask Size Large  Set Rate 12 breaths/min  Respiratory Rate 13 breaths/min  IPAP 18 cmH20  EPAP 6 cmH2O  Pressure Support 12 cmH20 (pc)  PEEP 6 cmH20  FiO2 (%) 35 %  Minute Ventilation 11.8  Leak 59  Peak Inspiratory Pressure (PIP) 18  Tidal Volume (Vt) 501  Patient Home Machine No  Patient Home Mask No  Patient Home Tubing No  Auto Titrate No  Press High Alarm 30 cmH2O  CPAP/SIPAP surface wiped down Yes  Device Plugged into RED Power Outlet Yes  BiPAP/CPAP /SiPAP Vitals  Pulse Rate 94  Resp 13  SpO2 92 %  MEWS Score/Color  MEWS Score 1  MEWS Score Color Green

## 2023-12-15 NOTE — Progress Notes (Signed)
   12/15/23 1633  Spiritual Encounters  Type of Visit Initial  Care provided to: Patient  Referral source Nurse (RN/NT/LPN)  Reason for visit Advance directives   Followed up on Chaplain Katy who left HCPOA document with [name].  Answered questions, but not ready to complete at this time. Offered relational support and wished him improved health. Has sisters living in [place] area.  [name] HERO.Div

## 2023-12-16 ENCOUNTER — Inpatient Hospital Stay (HOSPITAL_COMMUNITY)

## 2023-12-16 DIAGNOSIS — J9601 Acute respiratory failure with hypoxia: Secondary | ICD-10-CM | POA: Diagnosis not present

## 2023-12-16 LAB — BASIC METABOLIC PANEL WITH GFR
Anion gap: 5 (ref 5–15)
BUN: 18 mg/dL (ref 8–23)
CO2: 31 mmol/L (ref 22–32)
Calcium: 8.7 mg/dL — ABNORMAL LOW (ref 8.9–10.3)
Chloride: 99 mmol/L (ref 98–111)
Creatinine, Ser: 0.97 mg/dL (ref 0.61–1.24)
GFR, Estimated: >60 mL/min (ref >60)
Glucose, Bld: 99 mg/dL (ref 70–99)
Potassium: 4.4 mmol/L (ref 3.5–5.1)
Sodium: 135 mmol/L (ref 135–145)

## 2023-12-16 LAB — CBC
HCT: 43.2 % (ref 39.0–52.0)
Hemoglobin: 13.5 g/dL (ref 13.0–17.0)
MCH: 32 pg (ref 26.0–34.0)
MCHC: 31.3 g/dL (ref 30.0–36.0)
MCV: 102.4 fL — ABNORMAL HIGH (ref 80.0–100.0)
Platelets: 147 K/uL — ABNORMAL LOW (ref 150–400)
RBC: 4.22 MIL/uL (ref 4.22–5.81)
RDW: 14.2 % (ref 11.5–15.5)
WBC: 9.7 K/uL (ref 4.0–10.5)
nRBC: 0.2 % (ref 0.0–0.2)

## 2023-12-16 MED ORDER — ORAL CARE MOUTH RINSE: 15.0000 mL | OROMUCOSAL | Status: DC | PRN

## 2023-12-16 MED ORDER — SENNOSIDES-DOCUSATE SODIUM 8.6-50 MG PO TABS
1.0000 | ORAL_TABLET | Freq: Two times a day (BID) | ORAL | Status: DC
  Administered 2023-12-16 – 2023-12-18 (×6): 1 via ORAL
  Filled 2023-12-16 (×6): qty 1

## 2023-12-16 MED ORDER — BUDESONIDE 0.5 MG/2ML IN SUSP
0.5000 mg | Freq: Two times a day (BID) | RESPIRATORY_TRACT | Status: DC
  Administered 2023-12-16 – 2023-12-19 (×6): 0.5 mg via RESPIRATORY_TRACT
  Filled 2023-12-16 (×6): qty 2

## 2023-12-16 MED ORDER — POLYETHYLENE GLYCOL 3350 17 G PO PACK
17.0000 g | PACK | Freq: Every day | ORAL | Status: DC
  Administered 2023-12-16: 17 g via ORAL
  Filled 2023-12-16 (×2): qty 1

## 2023-12-16 NOTE — Progress Notes (Signed)
   12/16/23 0032  BiPAP/CPAP/SIPAP  $ Non-Invasive Ventilator  Non-Invasive Vent Subsequent  BiPAP/CPAP/SIPAP Pt Type Adult  BiPAP/CPAP/SIPAP SERVO  Reason BIPAP/CPAP not in use Other(comment) (pt took bipap off, on standby @ this time. pt back on 5L [place])  BiPAP/CPAP /SiPAP Vitals  Pulse Rate 86  Resp 17  SpO2 98 %  MEWS Score/Color  MEWS Score 0  MEWS Score Color [place]

## 2023-12-16 NOTE — Plan of Care (Signed)
°  Problem: Education: °Goal: Knowledge of disease or condition will improve °Outcome: Progressing °Goal: Knowledge of the prescribed therapeutic regimen will improve °Outcome: Progressing °Goal: Individualized Educational Video(s) °Outcome: Progressing °  °

## 2023-12-16 NOTE — Progress Notes (Signed)
 PT Cancellation Note  Patient Details Name: [name] MRN: [id number] DOB: March 07, 1955   Cancelled Treatment:    Reason Eval/Treat Not Completed: Other (comment) Patient ambulated with RN, reports desaturation on 15 L. PT will follow tomorrow. Darice Potters PT Acute Rehabilitation Services Office [phone]  Potters Darice Norris 12/16/2023, 3:01 PM

## 2023-12-16 NOTE — Plan of Care (Signed)
  Problem: Respiratory: Goal: Levels of oxygenation will improve Outcome: Progressing   Problem: Health Behavior/Discharge Planning: Goal: Ability to manage health-related needs will improve Outcome: Progressing   Problem: Clinical Measurements: Goal: Will remain free from infection Outcome: Progressing Goal: Cardiovascular complication will be avoided Outcome: Progressing   Problem: Clinical Measurements: Goal: Ability to maintain a body temperature in the normal range will improve Outcome: Not Progressing

## 2023-12-16 NOTE — Progress Notes (Signed)
   12/16/23 1938  BiPAP/CPAP/SIPAP  Reason BIPAP/CPAP not in use Other(comment) (on standby- prn)  BiPAP/CPAP /SiPAP Vitals  Resp (!) 22  SpO2 93 %  Bilateral Breath Sounds Rhonchi  MEWS Score/Color  MEWS Score 1  MEWS Score Color Green

## 2023-12-16 NOTE — Progress Notes (Signed)
 The patient ambulated around the unit. He required oxygen immediately dropping to 80%, after trying on room air. He was able to get back to 90% on 10 liters, but as he got close to getting back he dropped to 77% and he required 15 liters.

## 2023-12-16 NOTE — Progress Notes (Signed)
   NAME:  [name], MRN:  [id number], DOB:  06-04-1955, LOS: 2 ADMISSION DATE:  12/14/2023, CONSULTATION DATE:  12/15/23 REFERRING MD:  TRH, CHIEF COMPLAINT:  Hypercapnia   History of Present Illness:  [name] is a 68 year old male with a past medical history significant for COPD, asthma, tobacco use, HTN, HLD, obesity, colon polyps presented to the ED at [facility] 11/30 for acute respiratory distress.  Patient states symptoms began 1 week prior to admission and reports productive cough, wheeze, and fatigue.  Workup on admission concerning for acute COPD exacerbation in the setting of community-acquired pneumonia.  Patient was admitted per hospitalist.  Morning of 12/1 patient remains hypercapnic prompting PCCM consult  Pertinent  Medical History  COPD, asthma, tobacco use, HTN, HLD, obesity, colon polyps  Significant Hospital Events: Including procedures, antibiotic start and stop dates in addition to other pertinent events   11/30 presented with respiratory distress admitted for AECOPD and CAP   12/1 On 11L HFNC, ABG with PH 7.29, PCO2 79, and Bicarb 37.9  Interim History / Subjective:  Off BiPAP this AM, didn't wear it overnight. States that's never going to happen when told he might need to wear it at night. Down to 5L O2 via [place].  Objective    Blood pressure 138/64, pulse 96, temperature (!) 100.5 F (38.1 C), temperature source Oral, resp. rate (!) 28, height 5' 9 (1.753 m), weight 100.3 kg, SpO2 91%.    FiO2 (%):  [35 %] 35 % PEEP:  [6 cmH20] 6 cmH20 Pressure Support:  [12 cmH20] 12 cmH20   Intake/Output Summary (Last 24 hours) at 12/16/2023 0747 Last data filed at 12/15/2023 2300 Gross per 24 hour  Intake 350 ml  Output 775 ml  Net -425 ml   Filed Weights   12/14/23 1348  Weight: 100.3 kg    Examination: General: Adult male, resting in bed, in NAD. Neuro: A&O x 3, no deficits. HEENT: [place]/AT. Sclerae anicteric. EOMI. Cardiovascular: RRR, no  M/R/G.  Lungs: Respirations even and unlabored.  CTA bilaterally, No W/R/R. Abdomen: BS x 4, soft, NT/ND.  Musculoskeletal: No gross deformities, no edema.    Assessment and Plan   Acute hypoxic and hypercapnic respiratory failure - presumed 2/2 PNA. Suspect some component of chronic hypoxia thought he states he has never required home O2 Community acquire pneumonia, POA - RVP neg Acute exacerbation of chronic COPD, POA -Patient states he was visiting with fmaily for the holiday and when they all started to smoke it triggered me  -Reports he uses his rescue albuterol  upward of 6 times per days and often uses his scheduled Breztri  whenever I need it  Daily tobacco use - States he smoke 6 cigarettes per day  P: Continue supplemental oxygen for sat goal greater than 88%, wean as able  Will need ambulatory desaturation study as O2 needs decrease and prior to discharge to ensure no home O2 needs BiPAP PRN though doubt he will comply Continue empiric Azithromycin , Ceftriaxone  Continue triple neb therapy  Diureses as needed  Aspiration precautions  Mobilize   Rest per primary team.   Sammi Gore, PA - C [facility] For pager details, please see AMION or use Epic chat  After 1900, please call ELINK for cross coverage needs 12/16/2023, 7:55 AM

## 2023-12-16 NOTE — Progress Notes (Signed)
 PROGRESS NOTE  [name]  FMW:[id number] DOB: 1955-11-18 DOA: 12/14/2023 PCP: Patient, No Pcp Per   Brief Narrative: Patient is a 68 year old male with history of asthma/COPD, diverticulosis, hypertension, obesity, hyperlipidemia who presented with complaint of shortness of breath, productive cough some fatigue, wheezing.  On presentation, labs showed mild leukocytosis, elevated troponin with flat trend.  Venous gas showed pCO2 of 61.  COVID/flu/influenza negative.  Chest x-ray showed left basilar hazy and patchy areas of opacification/consolidation.  Patient admitted for further management of acute hypoxic respiratory failure secondary to community-acquired pneumonia/COPD exacerbation.  Started on antibiotics, culture sent.   ABG showed hypercarbia/acidosis.  PCCM consulted.  Clinically improving.  Weaned down to 5 L of oxygen today.  Assessment & Plan:  Principal Problem:   Acute respiratory failure with hypoxia (HCC) Active Problems:   Tobacco abuse   Essential hypertension   CAP (community acquired pneumonia)   Hyperlipidemia   COPD exacerbation (HCC)   Grade I diastolic dysfunction  Acute hypoxic/hypercarbic respiratory failure secondary to community-acquired pneumonia/COPD exacerbation: Presented with fatigue, dyspnea, productive cough.  ABG showed elevated pCO2.  Started on BiPAP but he was not tolerating.Put on high flow nasal cannula  Continue broad-spectrum antibiotics.  He is not on oxygen at home.  Continue mucolytics.   Follow-up sputum culture/blood culture.  Procalcitonin of 0.48.  Had mild leukocytosis,now resolved.  Streptococcal pneumoniae urine antigen negative. Respiratory viral panel negative. PCCM following.  Advised to continue BiPAP at least at night.  He is weaned down to 4 L of oxygen per minute.  Still complains of some dyspnea. Follow-up chest x-ray showed mild improvement in the left basilar opacity  Abnormal blood culture: Blood culture sent on 11/30  showed gram-positive cocci in clusters in anaerobic bottle only.  This is most likely contamination.  Continue current antibiotics.  Repeat blood cultures have been sent on 12/1: No growth to date.  He had mild grade fever this morning.  History of HFpEF: Appears euvolemic on presentation.  On Lasix , losartan  at home.Continue the same  Elevated troponin: Flat trend.  No anginal symptoms.  Likely from demand ischemia.  No chest pain this morning  Continued tobacco use: Counseled for cessation.  Has recently cut down to 6 cigarettes a day  Hypertension: On losartan   Hyperlipidemia: On Lipitor  Obesity: BMI of  32.6  Weakness: PT consulted         DVT prophylaxis:enoxaparin  (LOVENOX ) injection 40 mg Start: 12/14/23 2200     Code Status: Full Code  Family Communication: Significant other Antionial at bedside on 12/1  Patient status:Inatient  Patient is from :Home  Anticipated discharge un:ynfz  Estimated DC date:2-3 days   Consultants: PCCM  Procedures:None  Antimicrobials:  Anti-infectives (From admission, onward)    Start     Dose/Rate Route Frequency Ordered Stop   12/15/23 1400  cefTRIAXone  (ROCEPHIN ) 2 g in sodium chloride  0.9 % 100 mL IVPB        2 g 200 mL/hr over 30 Minutes Intravenous Every 24 hours 12/14/23 1258 12/19/23 1359   12/15/23 1400  azithromycin  (ZITHROMAX ) 500 mg in sodium chloride  0.9 % 250 mL IVPB        500 mg 250 mL/hr over 60 Minutes Intravenous Every 24 hours 12/14/23 1258 12/19/23 1359   12/14/23 1230  cefTRIAXone  (ROCEPHIN ) 2 g in sodium chloride  0.9 % 100 mL IVPB        2 g 200 mL/hr over 30 Minutes Intravenous  Once 12/14/23 1223 12/14/23 2243  12/14/23 1230  azithromycin  (ZITHROMAX ) 500 mg in sodium chloride  0.9 % 250 mL IVPB        500 mg 250 mL/hr over 60 Minutes Intravenous  Once 12/14/23 1223 12/14/23 1801       Subjective: Seen and examined the bedside today.  Overall comfortable.  Lying in bed.  Weaned down to 5 L of  oxygen per minute.  Still complains of some dyspnea and feeling weak.  Had mild grade fever earlier this morning but not during my evaluation.  Denied any worsening shortness of breath or cough.  Objective: Vitals:   12/16/23 0752 12/16/23 0754 12/16/23 0800 12/16/23 0900  BP:   (!) 107/44 118/67  Pulse:   92 88  Resp:   (!) 23 (!) 23  Temp:      TempSrc:      SpO2: 91% 96% 98% 90%  Weight:      Height:        Intake/Output Summary (Last 24 hours) at 12/16/2023 1015 Last data filed at 12/15/2023 2300 Gross per 24 hour  Intake 350 ml  Output 775 ml  Net -425 ml   Filed Weights   12/14/23 1348  Weight: 100.3 kg    Examination:  General exam: Overall comfortable, not in distress,obese HEENT: PERRL Respiratory system: Mild diminished sounds bilaterally, no significant wheezing or crackles Cardiovascular system: S1 & S2 heard, RRR.  Gastrointestinal system: Abdomen is nondistended, soft and nontender. Central nervous system: Alert and oriented Extremities: No edema, no clubbing ,no cyanosis Skin: No rashes, no ulcers,no icterus     Data Reviewed: I have personally reviewed following labs and imaging studies  CBC: Recent Labs  Lab 12/14/23 0705 12/15/23 0247 12/16/23 0304  WBC 11.3* 11.8* 9.7  NEUTROABS 8.5*  --   --   HGB 16.5 15.2 13.5  HCT 50.2 47.1 43.2  MCV 98.0 100.2* 102.4*  PLT 153 130* 147*   Basic Metabolic Panel: Recent Labs  Lab 12/14/23 0705 12/15/23 0247 12/16/23 0304  NA 136 136 135  K 3.9 5.0 4.4  CL 97* 98 99  CO2 31 33* 31  GLUCOSE 114* 123* 99  BUN 9 12 18   CREATININE 1.00 0.99 0.97  CALCIUM  9.3 8.8* 8.7*  MG 2.1  --   --   PHOS 3.1  --   --      Recent Results (from the past 240 hours)  Resp panel by RT-PCR (RSV, Flu A&B, Covid) Anterior Nasal Swab     Status: None   Collection Time: 12/14/23  7:35 AM   Specimen: Anterior Nasal Swab  Result Value Ref Range Status   SARS Coronavirus 2 by RT PCR NEGATIVE NEGATIVE Final     Comment: (NOTE) SARS-CoV-2 target nucleic acids are NOT DETECTED.  The SARS-CoV-2 RNA is generally detectable in upper respiratory specimens during the acute phase of infection. The lowest concentration of SARS-CoV-2 viral copies this assay can detect is 138 copies/mL. A negative result does not preclude SARS-Cov-2 infection and should not be used as the sole basis for treatment or other patient management decisions. A negative result may occur with  improper specimen collection/handling, submission of specimen other than nasopharyngeal swab, presence of viral mutation(s) within the areas targeted by this assay, and inadequate number of viral copies(<138 copies/mL). A negative result must be combined with clinical observations, patient history, and epidemiological information. The expected result is Negative.  Fact Sheet for Patients:  bloggercourse.com  Fact Sheet for Healthcare Providers:  seriousbroker.it  This  test is no t yet approved or cleared by the [place]  FDA and  has been authorized for detection and/or diagnosis of SARS-CoV-2 by FDA under an Emergency Use Authorization (EUA). This EUA will remain  in effect (meaning this test can be used) for the duration of the COVID-19 declaration under Section 564(b)(1) of the Act, 21 U.S.C.section 360bbb-3(b)(1), unless the authorization is terminated  or revoked sooner.       Influenza A by PCR NEGATIVE NEGATIVE Final   Influenza B by PCR NEGATIVE NEGATIVE Final    Comment: (NOTE) The Xpert Xpress SARS-CoV-2/FLU/RSV plus assay is intended as an aid in the diagnosis of influenza from Nasopharyngeal swab specimens and should not be used as a sole basis for treatment. Nasal washings and aspirates are unacceptable for Xpert Xpress SARS-CoV-2/FLU/RSV testing.  Fact Sheet for Patients: bloggercourse.com  Fact Sheet for Healthcare  Providers: seriousbroker.it  This test is not yet approved or cleared by the [place]  FDA and has been authorized for detection and/or diagnosis of SARS-CoV-2 by FDA under an Emergency Use Authorization (EUA). This EUA will remain in effect (meaning this test can be used) for the duration of the COVID-19 declaration under Section 564(b)(1) of the Act, 21 U.S.C. section 360bbb-3(b)(1), unless the authorization is terminated or revoked.     Resp Syncytial Virus by PCR NEGATIVE NEGATIVE Final    Comment: (NOTE) Fact Sheet for Patients: bloggercourse.com  Fact Sheet for Healthcare Providers: seriousbroker.it  This test is not yet approved or cleared by the [place]  FDA and has been authorized for detection and/or diagnosis of SARS-CoV-2 by FDA under an Emergency Use Authorization (EUA). This EUA will remain in effect (meaning this test can be used) for the duration of the COVID-19 declaration under Section 564(b)(1) of the Act, 21 U.S.C. section 360bbb-3(b)(1), unless the authorization is terminated or revoked.  Performed at [facility], [address], [place], [place] [address]   Culture, blood (single)     Status: None (Preliminary result)   Collection Time: 12/14/23 12:37 PM   Specimen: BLOOD LEFT ARM  Result Value Ref Range Status   Specimen Description   Final    BLOOD LEFT ARM BOTTLES DRAWN AEROBIC AND ANAEROBIC Performed at [facility], [address], [place], [place] [address]    Special Requests   Final    Blood Culture adequate volume Performed at [facility], [address], [place], [place] [address]    Culture  Setup Time   Final    GRAM POSITIVE COCCI IN CLUSTERS ANAEROBIC BOTTLE ONLY CRITICAL RESULT CALLED TO, READ BACK BY AND VERIFIED WITH: PHARMD MARY SWAYNE ON 12/15/23 @ 1441 BY DRT Performed at [facility], [address], [place], [place] [address]    Culture GRAM POSITIVE COCCI  Final   Report Status PENDING  Incomplete  Blood Culture ID Panel (Reflexed)     Status: Abnormal   Collection Time: 12/14/23 12:37 PM  Result Value Ref Range Status   Enterococcus faecalis NOT DETECTED NOT DETECTED Final   Enterococcus Faecium NOT DETECTED NOT DETECTED Final   Listeria monocytogenes NOT DETECTED NOT DETECTED Final   Staphylococcus species DETECTED (A) NOT DETECTED Final    Comment: CRITICAL RESULT CALLED TO, READ BACK BY AND VERIFIED WITH: PHARMD MARY SWAYNE ON 12/15/23 @ 1441 BY DRT    Staphylococcus aureus (BCID) NOT DETECTED NOT DETECTED Final   Staphylococcus epidermidis NOT DETECTED NOT DETECTED Final   Staphylococcus  lugdunensis NOT DETECTED NOT DETECTED Final   Streptococcus species NOT DETECTED NOT DETECTED Final   Streptococcus agalactiae NOT DETECTED NOT DETECTED Final   Streptococcus pneumoniae NOT DETECTED NOT DETECTED Final   Streptococcus pyogenes NOT DETECTED NOT DETECTED Final   A.calcoaceticus-baumannii NOT DETECTED NOT DETECTED Final   Bacteroides fragilis NOT DETECTED NOT DETECTED Final   Enterobacterales NOT DETECTED NOT DETECTED Final   Enterobacter cloacae complex NOT DETECTED NOT DETECTED Final   Escherichia coli NOT DETECTED NOT DETECTED Final   Klebsiella aerogenes NOT DETECTED NOT DETECTED Final   Klebsiella oxytoca NOT DETECTED NOT DETECTED Final   Klebsiella pneumoniae NOT DETECTED NOT DETECTED Final   Proteus species NOT DETECTED NOT DETECTED Final   Salmonella species NOT DETECTED NOT DETECTED Final   Serratia marcescens NOT DETECTED NOT DETECTED Final   Haemophilus influenzae NOT DETECTED NOT DETECTED Final   Neisseria meningitidis NOT DETECTED NOT DETECTED Final   Pseudomonas aeruginosa NOT DETECTED NOT DETECTED Final   Stenotrophomonas maltophilia NOT DETECTED NOT DETECTED Final   Candida albicans NOT DETECTED NOT DETECTED Final   Candida auris NOT  DETECTED NOT DETECTED Final   Candida glabrata NOT DETECTED NOT DETECTED Final   Candida krusei NOT DETECTED NOT DETECTED Final   Candida parapsilosis NOT DETECTED NOT DETECTED Final   Candida tropicalis NOT DETECTED NOT DETECTED Final   Cryptococcus neoformans/gattii NOT DETECTED NOT DETECTED Final    Comment: Performed at [facility], [address], [place], [place] [address]  Respiratory (~20 pathogens) panel by PCR     Status: None   Collection Time: 12/15/23  9:25 AM   Specimen: Nasopharyngeal Swab; Respiratory  Result Value Ref Range Status   Adenovirus NOT DETECTED NOT DETECTED Final   Coronavirus 229E NOT DETECTED NOT DETECTED Final    Comment: (NOTE) The Coronavirus on the Respiratory Panel, DOES NOT test for the novel  Coronavirus (2019 nCoV)    Coronavirus HKU1 NOT DETECTED NOT DETECTED Final   Coronavirus NL63 NOT DETECTED NOT DETECTED Final   Coronavirus OC43 NOT DETECTED NOT DETECTED Final   Metapneumovirus NOT DETECTED NOT DETECTED Final   Rhinovirus / Enterovirus NOT DETECTED NOT DETECTED Final   Influenza A NOT DETECTED NOT DETECTED Final   Influenza B NOT DETECTED NOT DETECTED Final   Parainfluenza Virus 1 NOT DETECTED NOT DETECTED Final   Parainfluenza Virus 2 NOT DETECTED NOT DETECTED Final   Parainfluenza Virus 3 NOT DETECTED NOT DETECTED Final   Parainfluenza Virus 4 NOT DETECTED NOT DETECTED Final   Respiratory Syncytial Virus NOT DETECTED NOT DETECTED Final   Bordetella pertussis NOT DETECTED NOT DETECTED Final   Bordetella Parapertussis NOT DETECTED NOT DETECTED Final   Chlamydophila pneumoniae NOT DETECTED NOT DETECTED Final   Mycoplasma pneumoniae NOT DETECTED NOT DETECTED Final    Comment: Performed at [facility], [address] [place], [place] [address]  Expectorated Sputum Assessment w Gram Stain, Rflx to Resp Cult     Status: None   Collection Time: 12/15/23 10:32 AM   Specimen: Sputum  Result Value Ref Range Status   Specimen  Description SPUTUM  Final   Special Requests NONE  Final   Sputum evaluation   Final    THIS SPECIMEN IS ACCEPTABLE FOR SPUTUM CULTURE Performed at [facility], [address] [place], [place] [address]    Report Status 12/15/2023 FINAL  Final  Culture, Respiratory w Gram Stain     Status: None (Preliminary result)   Collection  Time: 12/15/23 10:32 AM   Specimen: SPU  Result Value Ref Range Status   Specimen Description   Final    SPUTUM Performed at [facility], [address], [place], [place] [address]    Special Requests   Final    NONE Reflexed from [id number] Performed at [facility], [address] [place], [place] [address]    Gram Stain   Final    ABUNDANT WBC PRESENT, PREDOMINANTLY PMN NO ORGANISMS SEEN Performed at [facility], [address], [place], [place] [address]    Culture PENDING  Incomplete   Report Status PENDING  Incomplete  Culture, blood (Routine X 2) w Reflex to ID Panel     Status: None (Preliminary result)   Collection Time: 12/15/23  3:33 PM   Specimen: BLOOD RIGHT ARM  Result Value Ref Range Status   Specimen Description   Final    BLOOD RIGHT ARM Performed at [facility], [address], [place], [place] [address]    Special Requests   Final    BOTTLES DRAWN AEROBIC AND ANAEROBIC Blood Culture adequate volume Performed at [facility], [address], [place], [place] [address]    Culture   Final    NO GROWTH < 12 HOURS Performed at [facility], [address], [place], [place] [address]    Report Status PENDING  Incomplete  Culture, blood (Routine X 2) w Reflex to ID Panel     Status: None (Preliminary result)   Collection Time: 12/15/23  3:42 PM   Specimen: BLOOD RIGHT ARM  Result Value Ref Range Status   Specimen Description   Final    BLOOD RIGHT ARM Performed at [facility], [address], [place], [place] [address]    Special  Requests   Final    BOTTLES DRAWN AEROBIC AND ANAEROBIC Blood Culture results may not be optimal due to an inadequate volume of blood received in culture bottles Performed at [facility], [address], [place], [place] [address]    Culture   Final    NO GROWTH < 12 HOURS Performed at [facility], [address], [place], [place] [address]    Report Status PENDING  Incomplete     Radiology Studies: DG CHEST PORT 1 VIEW Result Date: 12/16/2023 CLINICAL DATA:  Shortness of breath. EXAM: PORTABLE CHEST 1 VIEW COMPARISON:  12/14/2023 FINDINGS: Lungs are adequately inflated and demonstrate persistent hazy airspace opacification over the left mid to lower lung with slight interval improved aeration. Right lung is clear. No effusion. Mediastinal silhouette and remainder of the exam is unchanged. IMPRESSION: Persistent hazy airspace opacification over the left mid to lower lung with slight interval improved aeration. Electronically Signed   By: Toribio Agreste M.D.   On: 12/16/2023 09:08    Scheduled Meds:  arformoterol   15 mcg Nebulization BID   aspirin  EC  81 mg Oral Daily   atorvastatin   40 mg Oral Daily   budesonide  (PULMICORT ) nebulizer solution  0.5 mg Nebulization BID   Chlorhexidine Gluconate Cloth  6 each Topical Daily   enoxaparin  (LOVENOX ) injection  40 mg Subcutaneous Q24H   feeding supplement  237 mL Oral BID BM   furosemide   20 mg Oral Daily   guaiFENesin   600 mg Oral BID   losartan   25 mg Oral Daily   montelukast   10 mg Oral QHS   polyethylene glycol  17  g Oral Daily   predniSONE   40 mg Oral Q breakfast   revefenacin   175 mcg Nebulization Daily   senna-docusate  1 tablet Oral BID   Continuous Infusions:  azithromycin  Stopped (12/15/23 1659)   cefTRIAXone  (ROCEPHIN )  IV Stopped (12/15/23 1517)     LOS: 2 days   Ivonne Mustache, MD [facility] P12/02/2023, 10:15 AM  PROGRESS NOTE  [name]  FMW:[id number] DOB: 06/19/1955 DOA:  12/14/2023 PCP: Patient, No Pcp Per   Brief Narrative:   Assessment & Plan:  Principal Problem:   Acute respiratory failure with hypoxia (HCC) Active Problems:   Tobacco abuse   Essential hypertension   CAP (community acquired pneumonia)   Hyperlipidemia   COPD exacerbation (HCC)   Grade I diastolic dysfunction           DVT prophylaxis:enoxaparin  (LOVENOX ) injection 40 mg Start: 12/14/23 2200     Code Status: Full Code  Family Communication:   Patient status:  Patient is from :  Anticipated discharge to:  Estimated DC date:   Consultants:   Procedures:  Antimicrobials:  Anti-infectives (From admission, onward)    Start     Dose/Rate Route Frequency Ordered Stop   12/15/23 1400  cefTRIAXone  (ROCEPHIN ) 2 g in sodium chloride  0.9 % 100 mL IVPB        2 g 200 mL/hr over 30 Minutes Intravenous Every 24 hours 12/14/23 1258 12/19/23 1359   12/15/23 1400  azithromycin  (ZITHROMAX ) 500 mg in sodium chloride  0.9 % 250 mL IVPB        500 mg 250 mL/hr over 60 Minutes Intravenous Every 24 hours 12/14/23 1258 12/19/23 1359   12/14/23 1230  cefTRIAXone  (ROCEPHIN ) 2 g in sodium chloride  0.9 % 100 mL IVPB        2 g 200 mL/hr over 30 Minutes Intravenous  Once 12/14/23 1223 12/14/23 2243   12/14/23 1230  azithromycin  (ZITHROMAX ) 500 mg in sodium chloride  0.9 % 250 mL IVPB        500 mg 250 mL/hr over 60 Minutes Intravenous  Once 12/14/23 1223 12/14/23 1801       Subjective:   Objective: Vitals:   12/16/23 0752 12/16/23 0754 12/16/23 0800 12/16/23 0900  BP:   (!) 107/44 118/67  Pulse:   92 88  Resp:   (!) 23 (!) 23  Temp:      TempSrc:      SpO2: 91% 96% 98% 90%  Weight:      Height:        Intake/Output Summary (Last 24 hours) at 12/16/2023 1015 Last data filed at 12/15/2023 2300 Gross per 24 hour  Intake 350 ml  Output 775 ml  Net -425 ml   Filed Weights   12/14/23 1348  Weight: 100.3 kg    Examination:  General exam: Overall comfortable,  not in distress HEENT: PERRL Respiratory system:  no wheezes or crackles  Cardiovascular system: S1 & S2 heard, RRR.  Gastrointestinal system: Abdomen is nondistended, soft and nontender. Central nervous system: Alert and oriented Extremities: No edema, no clubbing ,no cyanosis Skin: No rashes, no ulcers,no icterus     Data Reviewed: I have personally reviewed following labs and imaging studies  CBC: Recent Labs  Lab 12/14/23 0705 12/15/23 0247 12/16/23 0304  WBC 11.3* 11.8* 9.7  NEUTROABS 8.5*  --   --   HGB 16.5 15.2 13.5  HCT 50.2 47.1 43.2  MCV 98.0 100.2* 102.4*  PLT 153 130* 147*   Basic Metabolic Panel:  Recent Labs  Lab 12/14/23 0705 12/15/23 0247 12/16/23 0304  NA 136 136 135  K 3.9 5.0 4.4  CL 97* 98 99  CO2 31 33* 31  GLUCOSE 114* 123* 99  BUN 9 12 18   CREATININE 1.00 0.99 0.97  CALCIUM  9.3 8.8* 8.7*  MG 2.1  --   --   PHOS 3.1  --   --      Recent Results (from the past 240 hours)  Resp panel by RT-PCR (RSV, Flu A&B, Covid) Anterior Nasal Swab     Status: None   Collection Time: 12/14/23  7:35 AM   Specimen: Anterior Nasal Swab  Result Value Ref Range Status   SARS Coronavirus 2 by RT PCR NEGATIVE NEGATIVE Final    Comment: (NOTE) SARS-CoV-2 target nucleic acids are NOT DETECTED.  The SARS-CoV-2 RNA is generally detectable in upper respiratory specimens during the acute phase of infection. The lowest concentration of SARS-CoV-2 viral copies this assay can detect is 138 copies/mL. A negative result does not preclude SARS-Cov-2 infection and should not be used as the sole basis for treatment or other patient management decisions. A negative result may occur with  improper specimen collection/handling, submission of specimen other than nasopharyngeal swab, presence of viral mutation(s) within the areas targeted by this assay, and inadequate number of viral copies(<138 copies/mL). A negative result must be combined with clinical observations,  patient history, and epidemiological information. The expected result is Negative.  Fact Sheet for Patients:  bloggercourse.com  Fact Sheet for Healthcare Providers:  seriousbroker.it  This test is no t yet approved or cleared by the [place]  FDA and  has been authorized for detection and/or diagnosis of SARS-CoV-2 by FDA under an Emergency Use Authorization (EUA). This EUA will remain  in effect (meaning this test can be used) for the duration of the COVID-19 declaration under Section 564(b)(1) of the Act, 21 U.S.C.section 360bbb-3(b)(1), unless the authorization is terminated  or revoked sooner.       Influenza A by PCR NEGATIVE NEGATIVE Final   Influenza B by PCR NEGATIVE NEGATIVE Final    Comment: (NOTE) The Xpert Xpress SARS-CoV-2/FLU/RSV plus assay is intended as an aid in the diagnosis of influenza from Nasopharyngeal swab specimens and should not be used as a sole basis for treatment. Nasal washings and aspirates are unacceptable for Xpert Xpress SARS-CoV-2/FLU/RSV testing.  Fact Sheet for Patients: bloggercourse.com  Fact Sheet for Healthcare Providers: seriousbroker.it  This test is not yet approved or cleared by the [place]  FDA and has been authorized for detection and/or diagnosis of SARS-CoV-2 by FDA under an Emergency Use Authorization (EUA). This EUA will remain in effect (meaning this test can be used) for the duration of the COVID-19 declaration under Section 564(b)(1) of the Act, 21 U.S.C. section 360bbb-3(b)(1), unless the authorization is terminated or revoked.     Resp Syncytial Virus by PCR NEGATIVE NEGATIVE Final    Comment: (NOTE) Fact Sheet for Patients: bloggercourse.com  Fact Sheet for Healthcare Providers: seriousbroker.it  This test is not yet approved or cleared by the [place]  [place] FDA and has been authorized for detection and/or diagnosis of SARS-CoV-2 by FDA under an Emergency Use Authorization (EUA). This EUA will remain in effect (meaning this test can be used) for the duration of the COVID-19 declaration under Section 564(b)(1) of the Act, 21 U.S.C. section 360bbb-3(b)(1), unless the authorization is terminated or revoked.  Performed at [facility], [address] [place], [place] [address]  Culture, blood (single)     Status: None (Preliminary result)   Collection Time: 12/14/23 12:37 PM   Specimen: BLOOD LEFT ARM  Result Value Ref Range Status   Specimen Description   Final    BLOOD LEFT ARM BOTTLES DRAWN AEROBIC AND ANAEROBIC Performed at [facility], [address] [place], [place] [address]    Special Requests   Final    Blood Culture adequate volume Performed at [facility], [address], [place], [place] [address]    Culture  Setup Time   Final    GRAM POSITIVE COCCI IN CLUSTERS ANAEROBIC BOTTLE ONLY CRITICAL RESULT CALLED TO, READ BACK BY AND VERIFIED WITH: PHARMD MARY SWAYNE ON 12/15/23 @ 1441 BY DRT Performed at [facility] [facility], [address], [place], [place] [address]    Culture GRAM POSITIVE COCCI  Final   Report Status PENDING  Incomplete  Blood Culture ID Panel (Reflexed)     Status: Abnormal   Collection Time: 12/14/23 12:37 PM  Result Value Ref Range Status   Enterococcus faecalis NOT DETECTED NOT DETECTED Final   Enterococcus Faecium NOT DETECTED NOT DETECTED Final   Listeria monocytogenes NOT DETECTED NOT DETECTED Final   Staphylococcus species DETECTED (A) NOT DETECTED Final    Comment: CRITICAL RESULT CALLED TO, READ BACK BY AND VERIFIED WITH: PHARMD MARY SWAYNE ON 12/15/23 @ 1441 BY DRT    Staphylococcus aureus (BCID) NOT DETECTED NOT DETECTED Final   Staphylococcus epidermidis NOT DETECTED NOT DETECTED Final   Staphylococcus lugdunensis NOT DETECTED  NOT DETECTED Final   Streptococcus species NOT DETECTED NOT DETECTED Final   Streptococcus agalactiae NOT DETECTED NOT DETECTED Final   Streptococcus pneumoniae NOT DETECTED NOT DETECTED Final   Streptococcus pyogenes NOT DETECTED NOT DETECTED Final   A.calcoaceticus-baumannii NOT DETECTED NOT DETECTED Final   Bacteroides fragilis NOT DETECTED NOT DETECTED Final   Enterobacterales NOT DETECTED NOT DETECTED Final   Enterobacter cloacae complex NOT DETECTED NOT DETECTED Final   Escherichia coli NOT DETECTED NOT DETECTED Final   Klebsiella aerogenes NOT DETECTED NOT DETECTED Final   Klebsiella oxytoca NOT DETECTED NOT DETECTED Final   Klebsiella pneumoniae NOT DETECTED NOT DETECTED Final   Proteus species NOT DETECTED NOT DETECTED Final   Salmonella species NOT DETECTED NOT DETECTED Final   Serratia marcescens NOT DETECTED NOT DETECTED Final   Haemophilus influenzae NOT DETECTED NOT DETECTED Final   Neisseria meningitidis NOT DETECTED NOT DETECTED Final   Pseudomonas aeruginosa NOT DETECTED NOT DETECTED Final   Stenotrophomonas maltophilia NOT DETECTED NOT DETECTED Final   Candida albicans NOT DETECTED NOT DETECTED Final   Candida auris NOT DETECTED NOT DETECTED Final   Candida glabrata NOT DETECTED NOT DETECTED Final   Candida krusei NOT DETECTED NOT DETECTED Final   Candida parapsilosis NOT DETECTED NOT DETECTED Final   Candida tropicalis NOT DETECTED NOT DETECTED Final   Cryptococcus neoformans/gattii NOT DETECTED NOT DETECTED Final    Comment: Performed at [facility], [address] [place], [place] [address]  Respiratory (~20 pathogens) panel by PCR     Status: None   Collection Time: 12/15/23  9:25 AM   Specimen: Nasopharyngeal Swab; Respiratory  Result Value Ref Range Status   Adenovirus NOT DETECTED NOT DETECTED Final   Coronavirus 229E NOT DETECTED NOT DETECTED Final    Comment: (NOTE) The Coronavirus on the Respiratory Panel, DOES NOT test for the novel   Coronavirus (2019 nCoV)    Coronavirus HKU1 NOT  DETECTED NOT DETECTED Final   Coronavirus NL63 NOT DETECTED NOT DETECTED Final   Coronavirus OC43 NOT DETECTED NOT DETECTED Final   Metapneumovirus NOT DETECTED NOT DETECTED Final   Rhinovirus / Enterovirus NOT DETECTED NOT DETECTED Final   Influenza A NOT DETECTED NOT DETECTED Final   Influenza B NOT DETECTED NOT DETECTED Final   Parainfluenza Virus 1 NOT DETECTED NOT DETECTED Final   Parainfluenza Virus 2 NOT DETECTED NOT DETECTED Final   Parainfluenza Virus 3 NOT DETECTED NOT DETECTED Final   Parainfluenza Virus 4 NOT DETECTED NOT DETECTED Final   Respiratory Syncytial Virus NOT DETECTED NOT DETECTED Final   Bordetella pertussis NOT DETECTED NOT DETECTED Final   Bordetella Parapertussis NOT DETECTED NOT DETECTED Final   Chlamydophila pneumoniae NOT DETECTED NOT DETECTED Final   Mycoplasma pneumoniae NOT DETECTED NOT DETECTED Final    Comment: Performed at [facility], [address], [place], [place] [address]  Expectorated Sputum Assessment w Gram Stain, Rflx to Resp Cult     Status: None   Collection Time: 12/15/23 10:32 AM   Specimen: Sputum  Result Value Ref Range Status   Specimen Description SPUTUM  Final   Special Requests NONE  Final   Sputum evaluation   Final    THIS SPECIMEN IS ACCEPTABLE FOR SPUTUM CULTURE Performed at [facility], [address], [place], [place] [address]    Report Status 12/15/2023 FINAL  Final  Culture, Respiratory w Gram Stain     Status: None (Preliminary result)   Collection Time: 12/15/23 10:32 AM   Specimen: SPU  Result Value Ref Range Status   Specimen Description   Final    SPUTUM Performed at [facility], [address] [place], [place] [address]    Special Requests   Final    NONE Reflexed from [id number] Performed at [facility], [address] [place], [place] [address]    Gram Stain   Final    ABUNDANT WBC  PRESENT, PREDOMINANTLY PMN NO ORGANISMS SEEN Performed at [facility], [address], [place], [place] [address]    Culture PENDING  Incomplete   Report Status PENDING  Incomplete  Culture, blood (Routine X 2) w Reflex to ID Panel     Status: None (Preliminary result)   Collection Time: 12/15/23  3:33 PM   Specimen: BLOOD RIGHT ARM  Result Value Ref Range Status   Specimen Description   Final    BLOOD RIGHT ARM Performed at [facility], [address], [place], [place] [address]    Special Requests   Final    BOTTLES DRAWN AEROBIC AND ANAEROBIC Blood Culture adequate volume Performed at [facility], [address] [place], [place] [address]    Culture   Final    NO GROWTH < 12 HOURS Performed at [facility], [address], [place], [place] [address]    Report Status PENDING  Incomplete  Culture, blood (Routine X 2) w Reflex to ID Panel     Status: None (Preliminary result)   Collection Time: 12/15/23  3:42 PM   Specimen: BLOOD RIGHT ARM  Result Value Ref Range Status   Specimen Description   Final    BLOOD RIGHT ARM Performed at [facility], [address], [place], [place] [address]    Special Requests   Final    BOTTLES DRAWN AEROBIC AND ANAEROBIC Blood Culture results may not be optimal  due to an inadequate volume of blood received in culture bottles Performed at [facility], [address], [place], [place] [address]    Culture   Final    NO GROWTH < 12 HOURS Performed at [facility], [address], [place], [place] [address]    Report Status PENDING  Incomplete     Radiology Studies: DG CHEST PORT 1 VIEW Result Date: 12/16/2023 CLINICAL DATA:  Shortness of breath. EXAM: PORTABLE CHEST 1 VIEW COMPARISON:  12/14/2023 FINDINGS: Lungs are adequately inflated and demonstrate persistent hazy airspace opacification over the left mid to lower lung with slight interval improved aeration. Right lung  is clear. No effusion. Mediastinal silhouette and remainder of the exam is unchanged. IMPRESSION: Persistent hazy airspace opacification over the left mid to lower lung with slight interval improved aeration. Electronically Signed   By: Toribio Agreste M.D.   On: 12/16/2023 09:08    Scheduled Meds:  arformoterol   15 mcg Nebulization BID   aspirin  EC  81 mg Oral Daily   atorvastatin   40 mg Oral Daily   budesonide  (PULMICORT ) nebulizer solution  0.5 mg Nebulization BID   Chlorhexidine Gluconate Cloth  6 each Topical Daily   enoxaparin  (LOVENOX ) injection  40 mg Subcutaneous Q24H   feeding supplement  237 mL Oral BID BM   furosemide   20 mg Oral Daily   guaiFENesin   600 mg Oral BID   losartan   25 mg Oral Daily   montelukast   10 mg Oral QHS   polyethylene glycol  17 g Oral Daily   predniSONE   40 mg Oral Q breakfast   revefenacin   175 mcg Nebulization Daily   senna-docusate  1 tablet Oral BID   Continuous Infusions:  azithromycin  Stopped (12/15/23 1659)   cefTRIAXone  (ROCEPHIN )  IV Stopped (12/15/23 1517)     LOS: 2 days   Ivonne Mustache, MD [facility] P12/02/2023, 10:15 AM

## 2023-12-17 ENCOUNTER — Inpatient Hospital Stay (HOSPITAL_COMMUNITY)

## 2023-12-17 ENCOUNTER — Ambulatory Visit: Admitting: Acute Care

## 2023-12-17 DIAGNOSIS — I10 Essential (primary) hypertension: Secondary | ICD-10-CM

## 2023-12-17 DIAGNOSIS — E782 Mixed hyperlipidemia: Secondary | ICD-10-CM

## 2023-12-17 DIAGNOSIS — I5189 Other ill-defined heart diseases: Secondary | ICD-10-CM

## 2023-12-17 LAB — CULTURE, BLOOD (SINGLE): Special Requests: ADEQUATE

## 2023-12-17 LAB — ECHOCARDIOGRAM COMPLETE
AR max vel: 1.99 cm2
AV Area VTI: 2.23 cm2
AV Area mean vel: 1.87 cm2
AV Mean grad: 10 mmHg
AV Peak grad: 16.8 mmHg
Ao pk vel: 2.05 m/s
Area-P 1/2: 3.49 cm2
Calc EF: 65.4 %
Height: 69 in
S' Lateral: 3.4 cm
Single Plane A2C EF: 66.6 %
Single Plane A4C EF: 63.3 %
Weight: 3537.94 [oz_av]

## 2023-12-17 LAB — BASIC METABOLIC PANEL WITH GFR
Anion gap: 6 (ref 5–15)
BUN: 19 mg/dL (ref 8–23)
CO2: 31 mmol/L (ref 22–32)
Calcium: 8.6 mg/dL — ABNORMAL LOW (ref 8.9–10.3)
Chloride: 101 mmol/L (ref 98–111)
Creatinine, Ser: 0.84 mg/dL (ref 0.61–1.24)
GFR, Estimated: >60 mL/min (ref >60)
Glucose, Bld: 124 mg/dL — ABNORMAL HIGH (ref 70–99)
Potassium: 4 mmol/L (ref 3.5–5.1)
Sodium: 138 mmol/L (ref 135–145)

## 2023-12-17 LAB — CULTURE, RESPIRATORY W GRAM STAIN: Culture: NORMAL

## 2023-12-17 LAB — CBC
HCT: 41.6 % (ref 39.0–52.0)
Hemoglobin: 13.5 g/dL (ref 13.0–17.0)
MCH: 32.6 pg (ref 26.0–34.0)
MCHC: 32.5 g/dL (ref 30.0–36.0)
MCV: 100.5 fL — ABNORMAL HIGH (ref 80.0–100.0)
Platelets: 180 K/uL (ref 150–400)
RBC: 4.14 MIL/uL — ABNORMAL LOW (ref 4.22–5.81)
RDW: 14.1 % (ref 11.5–15.5)
WBC: 8.1 K/uL (ref 4.0–10.5)
nRBC: 0.6 % — ABNORMAL HIGH (ref 0.0–0.2)

## 2023-12-17 MED ORDER — IPRATROPIUM-ALBUTEROL 0.5-2.5 (3) MG/3ML IN SOLN
3.0000 mL | RESPIRATORY_TRACT | Status: DC | PRN
  Administered 2023-12-18 – 2023-12-19 (×2): 3 mL via RESPIRATORY_TRACT
  Filled 2023-12-17 (×2): qty 3

## 2023-12-17 NOTE — Evaluation (Addendum)
 Physical Therapy Brief Evaluation and Discharge Note Patient Details Name: [name] MRN: [id number] DOB: 10-03-1955 Today's Date: 12/17/2023   History of Present Illness  Pt is 68y.o. male admitted on 12/14/23 with respiratory distress for a week with productive cough, wheezing and fatigue. His PMH includes: asthma, COPD, h/o tobacco use, HTN, obesity, hyperlipidemia, colon polyps, T2DM and h/o pneumonia. He wasn't previously on supplemental oxygen. Chest x-ray revealed left basilar hazy and patchy areas of consolidation consistent with CT completed 6 days ago.  Clinical Impression  Pt agreeable to PT evaluation on second attempt. Ambulates at MOD I on 2L O2 with PT managing lines and O2 tank. O2 sats 88-92% with all mobility. Pt appears to be functioning at his baseline mobility level and does not require further PT intervention or follow up services. PT will sign off at this time. Please re-consult if there is a change in medical status. Encouraged patient to continue walking with nursing staff as able.  Pt may need supplemental oxygen at discharge as he was not previously on it.        PT Assessment Patient does not need any further PT services  Assistance Needed at Discharge  PRN    Equipment Recommendations Other (comment) (possible oxygen depending on continued medical improvements)  Recommendations for Other Services       Precautions/Restrictions Precautions Precautions: None Restrictions Weight Bearing Restrictions Per Provider Order: No        Mobility  Bed Mobility       General bed mobility comments: seated in recliner upon PT arrival  Transfers Overall transfer level: Independent                      Ambulation/Gait Ambulation/Gait assistance: Modified independent (Device/Increase time) Gait Distance (Feet): 150 Feet Assistive device: None   Gait Speed: Below normal General Gait Details: PT managed lines and O2 tank during ambulation,  required 2L with O2 sats 88-92%. Slow cadence, narrow BOS, mild staggering laterally however no loss of balance  Home Activity Instructions    Stairs            Modified Rankin (Stroke Patients Only)        Balance Overall balance assessment: Modified Independent (intermittent support on wall when distracted while walking)                        Pertinent Vitals/Pain PT - Brief Vital Signs All Vital Signs Stable: Other (comment) (required 2L supplemental O2 throughout session, O2 sats 88-92%) Pain Assessment Pain Assessment: No/denies pain     Home Living Family/patient expects to be discharged to:: Private residence Living Arrangements: Alone Available Help at Discharge: Family Home Environment: Stairs to enter  [place] of Steps: 3 Home Equipment: Cane - single Librarian, Academic (2 wheels)   Additional Comments: reports owning RW and cane however has not used them    Prior Function Level of Independence: Independent      UE/LE Assessment   UE ROM/Strength/Tone/Coordination: WFL    LE ROM/Strength/Tone/Coordination: [facility]      Communication   Communication Communication: No apparent difficulties     Cognition Overall Cognitive Status: Appears within functional limits for tasks assessed/performed       General Comments      Exercises     Assessment/Plan    PT Problem List         PT Visit Diagnosis      No Skilled PT  Co-evaluation                AMPAC 6 Clicks Help needed turning from your back to your side while in a flat bed without using bedrails?: None Help needed moving from lying on your back to sitting on the side of a flat bed without using bedrails?: None Help needed moving to and from a bed to a chair (including a wheelchair)?: None Help needed standing up from a chair using your arms (e.g., wheelchair or bedside chair)?: None Help needed to walk in hospital room?: A Little Help needed climbing 3-5  steps with a railing? : A Little 6 Click Score: 22      End of Session Equipment Utilized During Treatment: Gait belt;Oxygen Activity Tolerance: Patient tolerated treatment well;Patient limited by fatigue Patient left: in chair;with call bell/phone within reach Nurse Communication: Mobility status;Other (comment) (O2 sats with mobility)       Time: 8583-8565 PT Time Calculation (min) (ACUTE ONLY): 18 min  Charges:   PT Evaluation $PT Eval Low Complexity: 1 Low      Maryn Freelove H. Aidenjames Heckmann, PT, DPT   [employer]  12/17/2023, 3:00 PM

## 2023-12-17 NOTE — TOC Initial Note (Signed)
 Transition of Care [facility]) - Initial/Assessment Note    Patient Details  Name: [name] MRN: [id number] Date of Birth: 1955/01/24  [facility] CM/SW Contact:    Jon ONEIDA Anon, RN Phone Number: 12/17/2023, 3:05 PM  Clinical Narrative:                 Pt is from home. RNCM met with pt at bedside to introduce role in DC planning. IP CM consulted for homeless issues. Pt states he lives at an address and states it is his mothers house. States he lives there with other relatives. Pt denies any DME or HH needs. Pt currently requiring O2 via [place], not at baseline. May need home O2 at DC. Will continue to follow for any DC planning needs.     Expected Discharge Plan: Home/Self Care Barriers to Discharge: Continued Medical Work up   Patient Goals and CMS Choice Patient states their goals for this hospitalization and ongoing recovery are:: Return home CMS Medicare.gov Compare Post Acute Care list provided to:: Patient Choice offered to / list presented to : Patient [place] ownership interest in [facility]provided to:: Patient    Expected Discharge Plan and Services In-house Referral: NA Discharge Planning Services: CM Consult Post Acute Care Choice: NA Living arrangements for the past 2 months: Single Family Home                 DME Arranged: N/A DME Agency: NA       HH Arranged: NA HH Agency: NA        Prior Living Arrangements/Services Living arrangements for the past 2 months: Single Family Home Lives with:: Relatives Patient language and need for interpreter reviewed:: Yes Do you feel safe going back to the place where you live?: Yes      Need for Family Participation in Patient Care: No (Comment) Care giver support system in place?: No (comment) Current home services: Other (comment) (None) Criminal Activity/Legal Involvement Pertinent to Current Situation/Hospitalization: No - Comment as needed  Activities of Daily Living   ADL  Screening (condition at time of admission) Independently performs ADLs?: Yes (appropriate for developmental age) Is the patient deaf or have difficulty hearing?: No Does the patient have difficulty seeing, even when wearing glasses/contacts?: Yes Does the patient have difficulty concentrating, remembering, or making decisions?: No  Permission Sought/Granted Permission sought to share information with : Family Supports    Share Information with NAME: Hurshel Novas (Significant other)  [phone]           Emotional Assessment Appearance:: Appears stated age Attitude/Demeanor/Rapport: Engaged Affect (typically observed): Accepting Orientation: : Oriented to Self, Oriented to Place, Oriented to  Time, Oriented to Situation Alcohol / Substance Use: Not Applicable Psych Involvement: No (comment)  Admission diagnosis:  Acute respiratory failure with hypoxia (HCC) [J96.01] Patient Active Problem List   Diagnosis Date Noted   Grade I diastolic dysfunction 12/14/2023   COPD exacerbation (HCC) 12/13/2022   Medication therapy management recommendation declined by patient 10/10/2022   Hyperlipidemia 05/22/2022   HCAP (healthcare-associated pneumonia) 05/21/2022   Acute respiratory distress 05/11/2022   CAP (community acquired pneumonia) 05/10/2022   Hx of adenomatous polyp of colon 01/13/2020   COPD with acute exacerbation (HCC) 11/23/2016   Diabetes mellitus screening 03/12/2016   Essential hypertension 10/22/2013   Elevated BP 07/19/2013   Obesity 02/04/2012   Acute respiratory failure with hypoxia (HCC) 02/04/2012   Tobacco abuse 02/03/2012   PCP:  Patient, No Pcp Per  Pharmacy:   [facility] [address] [place] [address] Phone: [phone] Fax: [phone]  [id number] - [place], [place] - [address] AT [facility] [address] [place]  [address] Phone: [phone] Fax: [phone]  [place] [facility] [address] [place] [address] Phone: [phone] Fax: [phone]     Social Drivers of Health (SDOH) Social History: SDOH Screenings   Food Insecurity: No Food Insecurity (12/14/2023)  Housing: High Risk (12/14/2023)  Transportation Needs: No Transportation Needs (12/14/2023)  Utilities: Not At Risk (12/14/2023)  Alcohol Screen: Low Risk  (05/06/2022)  Depression (PHQ2-9): Low Risk  (01/09/2023)  Financial Resource Strain: Medium Risk (01/09/2023)  Physical Activity: Insufficiently Active (01/09/2023)  Social Connections: Unknown (12/14/2023)  Stress: No Stress Concern Present (05/06/2022)  Tobacco Use: High Risk (12/14/2023)  Health Literacy: Adequate Health Literacy (01/09/2023)   SDOH Interventions:     Readmission Risk Interventions    12/17/2023    3:01 PM 12/16/2022   11:30 AM 05/13/2022   10:11 AM  Readmission Risk Prevention Plan  Post Dischage Appt Complete Complete Complete  Medication Screening Complete Complete Complete  Transportation Screening Complete Complete Complete

## 2023-12-17 NOTE — Plan of Care (Signed)
  Problem: Education: Goal: Knowledge of disease or condition will improve Outcome: Progressing Goal: Knowledge of the prescribed therapeutic regimen will improve Outcome: Progressing Goal: Individualized Educational Video(s) Outcome: Progressing   Problem: Activity: Goal: Ability to tolerate increased activity will improve Outcome: Progressing Goal: Will verbalize the importance of balancing activity with adequate rest periods Outcome: Progressing   Problem: Respiratory: Goal: Ability to maintain a clear airway will improve Outcome: Progressing Goal: Levels of oxygenation will improve Outcome: Progressing Goal: Ability to maintain adequate ventilation will improve Outcome: Progressing   Problem: Activity: Goal: Ability to tolerate increased activity will improve Outcome: Progressing

## 2023-12-17 NOTE — Progress Notes (Signed)
 Patient angry about bed alarm constantly going off. He stated that he would not wait for us  and he will get up whenever he wants without help. Bed alarm policy explained to patient. He said he did not care and he asked for us  to not turn back on the bed alarm. Patient aware of the risk of falling but stated that he is steady on his feet and has no trouble walking, which is true. After discussion with the patient, the decision was made to honor the patient's wishes of not having the bed alarm on.

## 2023-12-17 NOTE — Progress Notes (Signed)
 PROGRESS NOTE  [name] FMW:[id number] DOB: [birth year]   PCP: Patient, No Pcp Per  Patient is from: Home.  DOA: 12/14/2023 LOS: 3  Chief complaints Chief Complaint  Patient presents with   Respiratory Distress     Brief Narrative / Interim history: 68 year old male with a past medical history significant for COPD, asthma, tobacco use, HTN, HLD, obesity, colon polyps presented to the [facility] ED on 11/30 with shortness of breath, productive cough, wheezing and fatigue, and admitted with acute respiratory failure with hypoxia and hypercapnia in the setting of COPD exacerbation and LLL pneumonia.  VBG with normal pH but mild hypercapnia to 61.  Pro-Cal 0.48.  proBNP 138.  Troponin 39 and 38.  CXR concerning for LLL infiltrate.  Cultures obtained.  Patient was started on antibiotics, steroid, nebulizers and BiPAP.    Worsening hypercapnia to 79 with pH of 7.29 on 12/1 prompted PCCM consult.  Subjective: Seen and examined earlier this morning.  No major events overnight or this morning.  Reports feeling about the same.  He is upset about seeing different doctors.  Endorses productive cough.   Assessment and plan: Acute hypoxic/hypercarbic respiratory failure due to COPD exacerbation and CAP: Presented with fatigue, dyspnea, productive cough.  ABG showed elevated pCO2.  Started on BiPAP that he did not tolerate.  Put on HFNC.  Respiratory cultures NGTD.  RVP negative.  Currently saturating at 96% on 4 L at rest.  Still with productive cough and shortness of breath. -Continue ceftriaxone , Zithromax  and prednisone  -Continue triple nebulizer.  Change as needed albuterol  to DuoNebs. -Wean oxygen as able.  Incentive spirometry, flutter valve, OOB, PT/OT -Appreciate help by PCCM.    Chronic HFpEF: TTE in 04/2022 with LVEF of 60 to 65%, G1 DD, RVSP of 39 and mild elevated PASP.  proBNP negative.  CXR with LLL infiltrate.  Appears euvolemic on presentation.  Excellent urine output. -  Continue home Lasix  and losartan . -Follow echocardiogram-ordered by PCCM   Positive blood culture: Staphylococcus capitis in 1 out of 4 bottles.  Likely contaminant.  Repeat blood cultures on 12/1 NGTD.   Elevated troponin: Flat trend.  No anginal symptoms.  Likely from demand ischemia.  No chest pain this morning -Follow echocardiogram.   Continued tobacco use:  Has recently cut down to 6 cigarettes a day -Counseled on the importance of cessation.   Hypertension: Normotensive. - On losartan    Hyperlipidemia:  -On Lipitor   Class I obesity Body mass index is 32.65 kg/m.           DVT prophylaxis:  enoxaparin  (LOVENOX ) injection 40 mg Start: 12/14/23 2200  Code Status: Full code Family Communication: None at bedside Level of care: Stepdown Status is: Inpatient Remains inpatient appropriate because: Respiratory failure, COPD and pneumonia   Final disposition: Home   55 minutes with more than 50% spent in reviewing records, counseling patient/family and coordinating care.  Consultants:  PCCM  Procedures: None  Microbiology summarized: 11/30-COVID-19, influenza and RSV PCR nonreactive 11/30-blood culture with Staph capitis in 1 out of 4 bottles 12/1-RVP negative 12/1-repeat blood cultures NGTD 12/1-respiratory cultures NGTD  Objective: Vitals:   12/17/23 0700 12/17/23 0730 12/17/23 0750 12/17/23 0800  BP: 133/71   (!) 119/97  Pulse: 81     Resp: (!) 23 20  (!) 28  Temp:   97.8 F (36.6 C)   TempSrc:   Oral   SpO2: 98% 96%    Weight:      Height:  Examination:  GENERAL: No apparent distress.  Nontoxic. HEENT: MMM.  Vision and hearing grossly intact.  NECK: Supple.  No apparent JVD.  RESP:  No IWOB.  Diminished aeration probably due to poor respiratory effort.  Frequent cough during exam CVS:  RRR. Heart sounds normal.  ABD/GI/GU: BS+. Abd soft, NTND.  MSK/EXT:  Moves extremities. No apparent deformity. No edema.  SKIN: no apparent skin  lesion or wound NEURO: AA.  Oriented appropriately.  No apparent focal neuro deficit. PSYCH: Somewhat upset about provider change  Sch Meds:  Scheduled Meds:  arformoterol   15 mcg Nebulization BID   aspirin  EC  81 mg Oral Daily   atorvastatin   40 mg Oral Daily   budesonide  (PULMICORT ) nebulizer solution  0.5 mg Nebulization BID   Chlorhexidine Gluconate Cloth  6 each Topical Daily   enoxaparin  (LOVENOX ) injection  40 mg Subcutaneous Q24H   feeding supplement  237 mL Oral BID BM   furosemide   20 mg Oral Daily   guaiFENesin   600 mg Oral BID   losartan   25 mg Oral Daily   montelukast   10 mg Oral QHS   polyethylene glycol  17 g Oral Daily   predniSONE   40 mg Oral Q breakfast   revefenacin   175 mcg Nebulization Daily   senna-docusate  1 tablet Oral BID   Continuous Infusions:  azithromycin  Stopped (12/16/23 1547)   cefTRIAXone  (ROCEPHIN )  IV Stopped (12/16/23 1441)   PRN Meds:.acetaminophen  **OR** acetaminophen , ipratropium-albuterol , ondansetron  **OR** ondansetron  (ZOFRAN ) IV, mouth rinse, mouth rinse  Antimicrobials: Anti-infectives (From admission, onward)    Start     Dose/Rate Route Frequency Ordered Stop   12/15/23 1400  cefTRIAXone  (ROCEPHIN ) 2 g in sodium chloride  0.9 % 100 mL IVPB        2 g 200 mL/hr over 30 Minutes Intravenous Every 24 hours 12/14/23 1258 12/19/23 1359   12/15/23 1400  azithromycin  (ZITHROMAX ) 500 mg in sodium chloride  0.9 % 250 mL IVPB        500 mg 250 mL/hr over 60 Minutes Intravenous Every 24 hours 12/14/23 1258 12/19/23 1359   12/14/23 1230  cefTRIAXone  (ROCEPHIN ) 2 g in sodium chloride  0.9 % 100 mL IVPB        2 g 200 mL/hr over 30 Minutes Intravenous  Once 12/14/23 1223 12/14/23 2243   12/14/23 1230  azithromycin  (ZITHROMAX ) 500 mg in sodium chloride  0.9 % 250 mL IVPB        500 mg 250 mL/hr over 60 Minutes Intravenous  Once 12/14/23 1223 12/14/23 1801        I have personally reviewed the following labs and images: CBC: Recent Labs   Lab 12/14/23 0705 12/15/23 0247 12/16/23 0304 12/17/23 0301  WBC 11.3* 11.8* 9.7 8.1  NEUTROABS 8.5*  --   --   --   HGB 16.5 15.2 13.5 13.5  HCT 50.2 47.1 43.2 41.6  MCV 98.0 100.2* 102.4* 100.5*  PLT 153 130* 147* 180   BMP &GFR Recent Labs  Lab 12/14/23 0705 12/15/23 0247 12/16/23 0304 12/17/23 0301  NA 136 136 135 138  K 3.9 5.0 4.4 4.0  CL 97* 98 99 101  CO2 31 33* 31 31  GLUCOSE 114* 123* 99 124*  BUN 9 12 18 19   CREATININE 1.00 0.99 0.97 0.84  CALCIUM  9.3 8.8* 8.7* 8.6*  MG 2.1  --   --   --   PHOS 3.1  --   --   --    Estimated Creatinine Clearance: 99.6  mL/min (by C-G formula based on SCr of 0.84 mg/dL). Liver & Pancreas: Recent Labs  Lab 12/14/23 0705 12/15/23 0247  AST 29 18  ALT 18 16  ALKPHOS 105 87  BILITOT 1.5* 0.4  PROT 7.0 6.3*  ALBUMIN 4.1 3.5   No results for input(s): LIPASE, AMYLASE in the last 168 hours. No results for input(s): AMMONIA in the last 168 hours. Diabetic: No results for input(s): HGBA1C in the last 72 hours. No results for input(s): GLUCAP in the last 168 hours. Cardiac Enzymes: No results for input(s): CKTOTAL, CKMB, CKMBINDEX, TROPONINI in the last 168 hours. Recent Labs    12/14/23 0705  PROBNP 138.0   Coagulation Profile: No results for input(s): INR, PROTIME in the last 168 hours. Thyroid Function Tests: No results for input(s): TSH, T4TOTAL, FREET4, T3FREE, THYROIDAB in the last 72 hours. Lipid Profile: No results for input(s): CHOL, HDL, LDLCALC, TRIG, CHOLHDL, LDLDIRECT in the last 72 hours. Anemia Panel: No results for input(s): VITAMINB12, FOLATE, FERRITIN, TIBC, IRON, RETICCTPCT in the last 72 hours. Urine analysis:    Component Value Date/Time   COLORURINE YELLOW 06/11/2017 1927   APPEARANCEUR CLEAR 06/11/2017 1927   LABSPEC 1.014 06/11/2017 1927   PHURINE 5.0 06/11/2017 1927   GLUCOSEU NEGATIVE 06/11/2017 1927   HGBUR MODERATE (A) 06/11/2017  1927   BILIRUBINUR NEGATIVE 06/11/2017 1927   KETONESUR NEGATIVE 06/11/2017 1927   PROTEINUR NEGATIVE 06/11/2017 1927   UROBILINOGEN 1.0 05/28/2014 1848   NITRITE NEGATIVE 06/11/2017 1927   LEUKOCYTESUR NEGATIVE 06/11/2017 1927   Sepsis Labs: Invalid input(s): PROCALCITONIN, LACTICIDVEN  Microbiology: Recent Results (from the past 240 hours)  Resp panel by RT-PCR (RSV, Flu A&B, Covid) Anterior Nasal Swab     Status: None   Collection Time: 12/14/23  7:35 AM   Specimen: Anterior Nasal Swab  Result Value Ref Range Status   SARS Coronavirus 2 by RT PCR NEGATIVE NEGATIVE Final    Comment: (NOTE) SARS-CoV-2 target nucleic acids are NOT DETECTED.  The SARS-CoV-2 RNA is generally detectable in upper respiratory specimens during the acute phase of infection. The lowest concentration of SARS-CoV-2 viral copies this assay can detect is 138 copies/mL. A negative result does not preclude SARS-Cov-2 infection and should not be used as the sole basis for treatment or other patient management decisions. A negative result may occur with  improper specimen collection/handling, submission of specimen other than nasopharyngeal swab, presence of viral mutation(s) within the areas targeted by this assay, and inadequate number of viral copies(<138 copies/mL). A negative result must be combined with clinical observations, patient history, and epidemiological information. The expected result is Negative.  Fact Sheet for Patients:  bloggercourse.com  Fact Sheet for Healthcare Providers:  seriousbroker.it  This test is no t yet approved or cleared by the [place]  FDA and  has been authorized for detection and/or diagnosis of SARS-CoV-2 by FDA under an Emergency Use Authorization (EUA). This EUA will remain  in effect (meaning this test can be used) for the duration of the COVID-19 declaration under Section 564(b)(1) of the Act,  21 U.S.C.section 360bbb-3(b)(1), unless the authorization is terminated  or revoked sooner.       Influenza A by PCR NEGATIVE NEGATIVE Final   Influenza B by PCR NEGATIVE NEGATIVE Final    Comment: (NOTE) The Xpert Xpress SARS-CoV-2/FLU/RSV plus assay is intended as an aid in the diagnosis of influenza from Nasopharyngeal swab specimens and should not be used as a sole basis for treatment. Nasal washings and aspirates are  unacceptable for Xpert Xpress SARS-CoV-2/FLU/RSV testing.  Fact Sheet for Patients: bloggercourse.com  Fact Sheet for Healthcare Providers: seriousbroker.it  This test is not yet approved or cleared by the [place]  FDA and has been authorized for detection and/or diagnosis of SARS-CoV-2 by FDA under an Emergency Use Authorization (EUA). This EUA will remain in effect (meaning this test can be used) for the duration of the COVID-19 declaration under Section 564(b)(1) of the Act, 21 U.S.C. section 360bbb-3(b)(1), unless the authorization is terminated or revoked.     Resp Syncytial Virus by PCR NEGATIVE NEGATIVE Final    Comment: (NOTE) Fact Sheet for Patients: bloggercourse.com  Fact Sheet for Healthcare Providers: seriousbroker.it  This test is not yet approved or cleared by the [place]  FDA and has been authorized for detection and/or diagnosis of SARS-CoV-2 by FDA under an Emergency Use Authorization (EUA). This EUA will remain in effect (meaning this test can be used) for the duration of the COVID-19 declaration under Section 564(b)(1) of the Act, 21 U.S.C. section 360bbb-3(b)(1), unless the authorization is terminated or revoked.  Performed at [facility], [address] [place], [place] [address]   Culture, blood (single)     Status: Abnormal   Collection Time: 12/14/23 12:37 PM   Specimen: BLOOD LEFT ARM  Result  Value Ref Range Status   Specimen Description   Final    BLOOD LEFT ARM BOTTLES DRAWN AEROBIC AND ANAEROBIC Performed at [facility], [address] [place], [place] [address]    Special Requests   Final    Blood Culture adequate volume Performed at [facility], [address] [place], [place] [address]    Culture  Setup Time   Final    GRAM POSITIVE COCCI IN CLUSTERS ANAEROBIC BOTTLE ONLY CRITICAL RESULT CALLED TO, READ BACK BY AND VERIFIED WITH: PHARMD MARY SWAYNE ON 12/15/23 @ 1441 BY DRT    Culture (A)  Final    STAPHYLOCOCCUS CAPITIS THE SIGNIFICANCE OF ISOLATING THIS ORGANISM FROM A SINGLE SET OF BLOOD CULTURES WHEN MULTIPLE SETS ARE DRAWN IS UNCERTAIN. PLEASE NOTIFY THE MICROBIOLOGY DEPARTMENT WITHIN ONE WEEK IF SPECIATION AND SENSITIVITIES ARE REQUIRED. Performed at [facility], [address] [place], [place] [address]    Report Status 12/17/2023 FINAL  Final  Blood Culture ID Panel (Reflexed)     Status: Abnormal   Collection Time: 12/14/23 12:37 PM  Result Value Ref Range Status   Enterococcus faecalis NOT DETECTED NOT DETECTED Final   Enterococcus Faecium NOT DETECTED NOT DETECTED Final   Listeria monocytogenes NOT DETECTED NOT DETECTED Final   Staphylococcus species DETECTED (A) NOT DETECTED Final    Comment: CRITICAL RESULT CALLED TO, READ BACK BY AND VERIFIED WITH: PHARMD MARY SWAYNE ON 12/15/23 @ 1441 BY DRT    Staphylococcus aureus (BCID) NOT DETECTED NOT DETECTED Final   Staphylococcus epidermidis NOT DETECTED NOT DETECTED Final   Staphylococcus lugdunensis NOT DETECTED NOT DETECTED Final   Streptococcus species NOT DETECTED NOT DETECTED Final   Streptococcus agalactiae NOT DETECTED NOT DETECTED Final   Streptococcus pneumoniae NOT DETECTED NOT DETECTED Final   Streptococcus pyogenes NOT DETECTED NOT DETECTED Final   A.calcoaceticus-baumannii NOT DETECTED NOT DETECTED Final   Bacteroides fragilis NOT DETECTED NOT  DETECTED Final   Enterobacterales NOT DETECTED NOT DETECTED Final   Enterobacter cloacae complex NOT DETECTED NOT DETECTED Final   Escherichia coli NOT DETECTED NOT DETECTED Final   Klebsiella aerogenes NOT DETECTED NOT DETECTED Final  Klebsiella oxytoca NOT DETECTED NOT DETECTED Final   Klebsiella pneumoniae NOT DETECTED NOT DETECTED Final   Proteus species NOT DETECTED NOT DETECTED Final   Salmonella species NOT DETECTED NOT DETECTED Final   Serratia marcescens NOT DETECTED NOT DETECTED Final   Haemophilus influenzae NOT DETECTED NOT DETECTED Final   Neisseria meningitidis NOT DETECTED NOT DETECTED Final   Pseudomonas aeruginosa NOT DETECTED NOT DETECTED Final   Stenotrophomonas maltophilia NOT DETECTED NOT DETECTED Final   Candida albicans NOT DETECTED NOT DETECTED Final   Candida auris NOT DETECTED NOT DETECTED Final   Candida glabrata NOT DETECTED NOT DETECTED Final   Candida krusei NOT DETECTED NOT DETECTED Final   Candida parapsilosis NOT DETECTED NOT DETECTED Final   Candida tropicalis NOT DETECTED NOT DETECTED Final   Cryptococcus neoformans/gattii NOT DETECTED NOT DETECTED Final    Comment: Performed at [facility], [address] [place], [place] [address]  Respiratory (~20 pathogens) panel by PCR     Status: None   Collection Time: 12/15/23  9:25 AM   Specimen: Nasopharyngeal Swab; Respiratory  Result Value Ref Range Status   Adenovirus NOT DETECTED NOT DETECTED Final   Coronavirus 229E NOT DETECTED NOT DETECTED Final    Comment: (NOTE) The Coronavirus on the Respiratory Panel, DOES NOT test for the novel  Coronavirus (2019 nCoV)    Coronavirus HKU1 NOT DETECTED NOT DETECTED Final   Coronavirus NL63 NOT DETECTED NOT DETECTED Final   Coronavirus OC43 NOT DETECTED NOT DETECTED Final   Metapneumovirus NOT DETECTED NOT DETECTED Final   Rhinovirus / Enterovirus NOT DETECTED NOT DETECTED Final   Influenza A NOT DETECTED NOT DETECTED Final   Influenza B NOT  DETECTED NOT DETECTED Final   Parainfluenza Virus 1 NOT DETECTED NOT DETECTED Final   Parainfluenza Virus 2 NOT DETECTED NOT DETECTED Final   Parainfluenza Virus 3 NOT DETECTED NOT DETECTED Final   Parainfluenza Virus 4 NOT DETECTED NOT DETECTED Final   Respiratory Syncytial Virus NOT DETECTED NOT DETECTED Final   Bordetella pertussis NOT DETECTED NOT DETECTED Final   Bordetella Parapertussis NOT DETECTED NOT DETECTED Final   Chlamydophila pneumoniae NOT DETECTED NOT DETECTED Final   Mycoplasma pneumoniae NOT DETECTED NOT DETECTED Final    Comment: Performed at [facility], [address] [place], [place] [address]  Expectorated Sputum Assessment w Gram Stain, Rflx to Resp Cult     Status: None   Collection Time: 12/15/23 10:32 AM   Specimen: Sputum  Result Value Ref Range Status   Specimen Description SPUTUM  Final   Special Requests NONE  Final   Sputum evaluation   Final    THIS SPECIMEN IS ACCEPTABLE FOR SPUTUM CULTURE Performed at [facility], [address] [place], [place] [address]    Report Status 12/15/2023 FINAL  Final  Culture, Respiratory w Gram Stain     Status: None (Preliminary result)   Collection Time: 12/15/23 10:32 AM   Specimen: SPU  Result Value Ref Range Status   Specimen Description   Final    SPUTUM Performed at [facility], [address] [place], [place] [address]    Special Requests   Final    NONE Reflexed from [id number] Performed at [facility], [address] [place], [place] [address]    Gram Stain   Final    ABUNDANT WBC PRESENT, PREDOMINANTLY PMN NO ORGANISMS SEEN    Culture   Final    NO GROWTH <  24 HOURS Performed at [facility], [address], [place], [place] [address]    Report Status PENDING  Incomplete  Culture, blood (Routine X 2) w Reflex to ID Panel     Status: None (Preliminary result)   Collection Time: 12/15/23  3:33 PM   Specimen: BLOOD RIGHT ARM   Result Value Ref Range Status   Specimen Description   Final    BLOOD RIGHT ARM Performed at [facility], [address], [place], [place] [address]    Special Requests   Final    BOTTLES DRAWN AEROBIC AND ANAEROBIC Blood Culture adequate volume Performed at [facility], [address], [place], [place] [address]    Culture   Final    NO GROWTH 2 DAYS Performed at [facility], [address] [place], [place] [address]    Report Status PENDING  Incomplete  Culture, blood (Routine X 2) w Reflex to ID Panel     Status: None (Preliminary result)   Collection Time: 12/15/23  3:42 PM   Specimen: BLOOD RIGHT ARM  Result Value Ref Range Status   Specimen Description   Final    BLOOD RIGHT ARM Performed at [facility], [address] [place], [place] [address]    Special Requests   Final    BOTTLES DRAWN AEROBIC AND ANAEROBIC Blood Culture results may not be optimal due to an inadequate volume of blood received in culture bottles Performed at [facility], [address], [place], [place] [address]    Culture   Final    NO GROWTH 2 DAYS Performed at [facility], [address] [place], [place] [address]    Report Status PENDING  Incomplete    Radiology Studies: No results found.    Leiana Rund T. Dejaun Vidrio [facility]  If 7PM-7AM, please contact night-coverage [email or web address] 12/17/2023, 9:12 AM

## 2023-12-17 NOTE — Progress Notes (Addendum)
 NAME:  [name], MRN:  [id number], DOB:  [birth year], LOS: 3 ADMISSION DATE:  12/14/2023, CONSULTATION DATE:  12/15/23 REFERRING MD:  TRH, CHIEF COMPLAINT:  Hypercapnia   History of Present Illness:  [name] is a 68 year old male with a past medical history significant for COPD, asthma, tobacco use, HTN, HLD, obesity, colon polyps presented to the ED at [facility] 11/30 for acute respiratory distress.  Patient states symptoms began 1 week prior to admission and reports productive cough, wheeze, and fatigue.  Workup on admission concerning for acute COPD exacerbation in the setting of community-acquired pneumonia.  Patient was admitted per hospitalist.  Morning of 12/1 patient remains hypercapnic prompting PCCM consult  Pertinent  Medical History  COPD, asthma, tobacco use, HTN, HLD, obesity, colon polyps  Significant Hospital Events: Including procedures, antibiotic start and stop dates in addition to other pertinent events   11/30 presented with respiratory distress admitted for AECOPD and CAP   12/1 On 11L HFNC, ABG with PH 7.29, PCO2 79, and Bicarb 37.9 12/2 desaturated to 70s while ambulating, required 15L to recover  Interim History / Subjective:  Continues to refuse nocturnal BiPAP.  Keeps removing [place] O2. This AM it was on his forehead but thankfully O2 sat 93 - 95% on room air. Desats quickly with exertion.  Objective    Blood pressure (!) 119/97, pulse 81, temperature 97.8 F (36.6 C), temperature source Oral, resp. rate (!) 28, height 5' 9 (1.753 m), weight 100.3 kg, SpO2 96%.        Intake/Output Summary (Last 24 hours) at 12/17/2023 0809 Last data filed at 12/17/2023 0320 Gross per 24 hour  Intake 348.18 ml  Output 1575 ml  Net -1226.82 ml   Filed Weights   12/14/23 1348  Weight: 100.3 kg    Examination: General: Adult male, resting comfortably in bed, in NAD. Neuro: A&O x 3, no deficits. HEENT: [place]/AT. Sclerae anicteric.  EOMI. Cardiovascular: RRR, no M/R/G.  Lungs: Respirations even and unlabored.  CTA bilaterally, No W/R/R. Abdomen: BS x 4, soft, NT/ND.  Musculoskeletal: No gross deformities, no edema.    Assessment and Plan   Acute hypoxic and hypercapnic respiratory failure - presumed 2/2 PNA. Suspect some component of chronic hypoxia thought he states he has never required home O2 Community acquire pneumonia, POA - RVP neg Acute exacerbation of chronic COPD, POA -Patient states he was visiting with fmaily for the holiday and when they all started to smoke it triggered me  -Reports he uses his rescue albuterol  upward of 6 times per days and often uses his scheduled Breztri  whenever I need it  Concern for [facility] - last echo April 2024 with mildly elevated PASP with RVSP . Daily tobacco use - States he smoke 6 cigarettes per day  P: Continue supplemental oxygen for sat goal greater than 88%, wean as able. He has been intermittently removing it while resting and seems to hold his sats mid 90s while at rest but desats quickly with exertion. Will need home O2 a least temporarily to start while recovering BiPAP PRN though doubt he will comply Continue empiric Azithromycin , Ceftriaxone  Continue triple neb therapy  Diureses as needed, currently seems euvolemic Aspiration precautions  Mobilize  Will repeat echo ? Pulm rehab  Rest per primary team.   Sammi Gore, PA - [facility] For pager details, please see AMION or use Epic chat  After 1900, please call [facility] for cross coverage needs 12/17/2023, 8:09 AM

## 2023-12-18 DIAGNOSIS — J181 Lobar pneumonia, unspecified organism: Secondary | ICD-10-CM

## 2023-12-18 DIAGNOSIS — E66811 Obesity, class 1: Secondary | ICD-10-CM

## 2023-12-18 DIAGNOSIS — J9602 Acute respiratory failure with hypercapnia: Secondary | ICD-10-CM

## 2023-12-18 DIAGNOSIS — J441 Chronic obstructive pulmonary disease with (acute) exacerbation: Secondary | ICD-10-CM

## 2023-12-18 LAB — RENAL FUNCTION PANEL
Albumin: 3 g/dL — ABNORMAL LOW (ref 3.5–5.0)
Anion gap: 7 (ref 5–15)
BUN: 15 mg/dL (ref 8–23)
CO2: 30 mmol/L (ref 22–32)
Calcium: 8.5 mg/dL — ABNORMAL LOW (ref 8.9–10.3)
Chloride: 102 mmol/L (ref 98–111)
Creatinine, Ser: 0.78 mg/dL (ref 0.61–1.24)
GFR, Estimated: >60 mL/min (ref >60)
Glucose, Bld: 125 mg/dL — ABNORMAL HIGH (ref 70–99)
Phosphorus: 1.8 mg/dL — ABNORMAL LOW (ref 2.5–4.6)
Potassium: 3.5 mmol/L (ref 3.5–5.1)
Sodium: 139 mmol/L (ref 135–145)

## 2023-12-18 LAB — CBC
HCT: 39.7 % (ref 39.0–52.0)
Hemoglobin: 13 g/dL (ref 13.0–17.0)
MCH: 31.9 pg (ref 26.0–34.0)
MCHC: 32.7 g/dL (ref 30.0–36.0)
MCV: 97.5 fL (ref 80.0–100.0)
Platelets: 190 K/uL (ref 150–400)
RBC: 4.07 MIL/uL — ABNORMAL LOW (ref 4.22–5.81)
RDW: 14 % (ref 11.5–15.5)
WBC: 8.5 K/uL (ref 4.0–10.5)
nRBC: 0 % (ref 0.0–0.2)

## 2023-12-18 LAB — PROCALCITONIN: Procalcitonin: 0.19 ng/mL

## 2023-12-18 LAB — MAGNESIUM: Magnesium: 2.5 mg/dL — ABNORMAL HIGH (ref 1.7–2.4)

## 2023-12-18 MED ORDER — POTASSIUM PHOSPHATES 15 MMOLE/5ML IV SOLN
30.0000 mmol | Freq: Once | INTRAVENOUS | Status: AC
  Administered 2023-12-18: 30 mmol via INTRAVENOUS
  Filled 2023-12-18: qty 10

## 2023-12-18 NOTE — Evaluation (Signed)
 Occupational Therapy Evaluation Patient Details Name: [name] MRN: [id number] DOB: [birth year] Today's Date: 12/18/2023   History of Present Illness   Pt is 68 yr old male admitted to the hospital on 12/14/23 with respiratory distress for a week with productive cough, wheezing and fatigue. He was found to have CAP. PMH includes: asthma, COPD, tobacco use, HTN, obesity, hyperlipidemia, colon polyps, DM II, PNA.     Clinical Impressions The pt performed all assessed tasks without the need for assistance, including doffing and donning his shoe seated in the chair, sit to stand, and functional ambulation without an assistive device. He was instructed on implementing pursed lip breathing and using an incentive spirometer as needed, as he reported feelings of mild shortness of breath with activity. All needed OT education and/or intervention was provided during the session. He does not require further OT services. OT will sign off and recommend he return home at discharge.      If plan is discharge home, recommend the following:   Other (comment) (N/A)     Functional Status Assessment   Patient has not had a recent decline in their functional status     Equipment Recommendations   None recommended by OT     Recommendations for Other Services         Precautions/Restrictions   Restrictions Weight Bearing Restrictions Per Provider Order: No     Mobility Bed Mobility        General bed mobility comments: pt was received seated in the bedside chair    Transfers Overall transfer level: Independent Equipment used: None Transfers: Sit to/from Stand Sit to Stand: Independent                  Balance Overall balance assessment: No apparent balance deficits (not formally assessed)       ADL either performed or assessed with clinical judgement   ADL Overall ADL's : Independent;At baseline;Modified independent          Pertinent Vitals/Pain  Pain Assessment Pain Assessment: No/denies pain     Extremity/Trunk Assessment Upper Extremity Assessment Upper Extremity Assessment: Overall WFL for tasks assessed;Right hand dominant (BUE AROM and strength WFL)   Lower Extremity Assessment Lower Extremity Assessment: Overall WFL for tasks assessed (BLE AROM and strength WFL)       Communication Communication Communication: No apparent difficulties   Cognition Arousal: Alert Behavior During Therapy: WFL for tasks assessed/performed Cognition: No apparent impairments        OT - Cognition Comments: Oriented x4                 Following commands: Intact       Cueing  General Comments   Cueing Techniques: Verbal cues              Home Living Family/patient expects to be discharged to:: Private residence Living Arrangements: Parent (Mother)     Home Access: Level entry     Home Layout: One level     Bathroom Shower/Tub: Dietitian: None          Prior Functioning/Environment Prior Level of Function : Independent/Modified Independent;Driving           OT Problem List: Other (comment) (N/A)        OT Goals(Current goals can be found in the care plan section)   Acute Rehab OT Goals OT Goal Formulation: All assessment and education complete, DC therapy  OT Frequency:   N/A    AM-PAC OT 6 Clicks Daily Activity     Outcome Measure Help from another person eating meals?: None Help from another person taking care of personal grooming?: None Help from another person toileting, which includes using toliet, bedpan, or urinal?: None Help from another person bathing (including washing, rinsing, drying)?: None Help from another person to put on and taking off regular upper body clothing?: None Help from another person to put on and taking off regular lower body clothing?: None 6 Click Score: 24   End of Session Equipment Utilized During Treatment: Other  (comment) (N/A) Nurse Communication: Mobility status  Activity Tolerance: Patient tolerated treatment well Patient left: in chair;with call bell/phone within reach  OT Visit Diagnosis: Muscle weakness (generalized) (M62.81)                Time: 1040-1055 OT Time Calculation (min): 15 min Charges:  OT General Charges $OT Visit: 1 Visit OT Evaluation $OT Eval Low Complexity: 1 Low    Delanna JINNY Lesches, OTR/L 12/18/2023, 1:04 PM

## 2023-12-18 NOTE — Progress Notes (Signed)
 Patient continues to refuse monitoring, oxygen therapy, and says stop being up under me like I was yo girlfriend. Charge nurse, Alan Portugal, RN further attempted to explain necessity of monitoring, therapies prescribed by physicians, and nurse/patient role.

## 2023-12-18 NOTE — Progress Notes (Signed)
  Progress Note   Patient: [name] FMW:[id number] DOB: [birth year] DOA: 12/14/2023     4 DOS: the patient was seen and examined on 12/18/2023   Brief hospital course: 69 year old man cigarette smoker presenting with shortness of breath.  Admitted for acute on chronic hypoxic and hypercapnic respiratory failure, pneumonia, COPD exacerbation.  Consultants PCCM  Procedures/Events 11/30 admission for respiratory failure 12/1 PCCM consult  Assessment and Plan: Acute hypoxic and hypercapnic respiratory failure COPD exacerbation Lobar pneumonia, left lower lobe Cigarette smoker Slowly improving.  Still has significant desats with movement.  Will plan for home oxygen. Continue antibiotics, prednisone , bronchodilators Transfer to progressive care  Chronic diastolic CHF Appears compensated.  Echo stable.  Elevated troponin Mild elevation consistent with shortness of breath.  No evidence of ACS.  Essential hypertension Stable  Class 1 obesity: BMI 30.0-34.9 kg/m Body mass index is 32.65 kg/m.   Blood culture positive, contaminant.     Subjective:  Feels better but not back to baseline Does not use oxygen at home  Physical Exam: Vitals:   12/18/23 0700 12/18/23 0730 12/18/23 0752 12/18/23 0800  BP:    (!) 150/82  Pulse: 81   81  Resp: (!) 23  19 (!) 24  Temp:  97.7 F (36.5 C)    TempSrc:  Axillary    SpO2: 95%   96%  Weight:      Height:       Physical Exam Vitals reviewed.  Constitutional:      General: He is not in acute distress.    Appearance: He is not ill-appearing or toxic-appearing.  Cardiovascular:     Rate and Rhythm: Normal rate and regular rhythm.     Heart sounds: No murmur heard. Pulmonary:     Effort: No respiratory distress.     Breath sounds: Wheezing present. No rhonchi or rales.     Comments: Mild increased respiratory effort especially with coughing Neurological:     Mental Status: He is alert.  Psychiatric:        Mood  and Affect: Mood normal.        Behavior: Behavior normal.     Data Reviewed: BMP unremarkable, Phos low at 1.8, magnesium  high 2.5, CBC unremarkable Echocardiogram normal LVEF  Family Communication: none  Disposition: Status is: Inpatient Remains inpatient appropriate because: COPD exacerbation     Time spent: 35 minutes  Author: Toribio Door, MD 12/18/2023 8:54 AM  For on call review [email or web address].

## 2023-12-18 NOTE — Progress Notes (Signed)
 SATURATION QUALIFICATIONS: (This note is used to comply with regulatory documentation for home oxygen)  Patient Saturations on Room Air at Rest = 94%  Patient Saturations on Room Air while Ambulating = 79%  Patient Saturations on 6 Liters of oxygen while Ambulating = 90%  Please briefly explain why patient needs home oxygen: Patient desaturates quickly with any activity.

## 2023-12-18 NOTE — Progress Notes (Signed)
 NAME:  [name], MRN:  [id number], DOB:  [birth year], LOS: 4 ADMISSION DATE:  12/14/2023, CONSULTATION DATE:  12/15/23 REFERRING MD:  TRH, CHIEF COMPLAINT:  Hypercapnia   History of Present Illness:  [name] is a 68 year old male with a past medical history significant for COPD, asthma, tobacco use, HTN, HLD, obesity, colon polyps presented to the ED at [facility] 11/30 for acute respiratory distress.  Patient states symptoms began 1 week prior to admission and reports productive cough, wheeze, and fatigue.  Workup on admission concerning for acute COPD exacerbation in the setting of community-acquired pneumonia.  Patient was admitted per hospitalist.  Morning of 12/1 patient remains hypercapnic prompting PCCM consult  Pertinent  Medical History  COPD, asthma, tobacco use, HTN, HLD, obesity, colon polyps  Significant Hospital Events: Including procedures, antibiotic start and stop dates in addition to other pertinent events   11/30 presented with respiratory distress admitted for AECOPD and CAP   12/1 On 11L HFNC, ABG with PH 7.29, PCO2 79, and Bicarb 37.9 12/2 desaturated to 70s while ambulating, required 15L to recover 12/4 not wanting to wear O2, still refusing BiPAP  Interim History / Subjective:  Not wanting to wear O2 but not as much a concern at rest as maintains sats. However, he desats quickly with exertion. I explained he will likely need home O2 at least with activity to which he didn't seem very happy about.  Objective    Blood pressure (!) 150/82, pulse 81, temperature 97.7 F (36.5 C), temperature source Axillary, resp. rate (!) 24, height 5' 9 (1.753 m), weight 100.3 kg, SpO2 96%.        Intake/Output Summary (Last 24 hours) at 12/18/2023 0910 Last data filed at 12/18/2023 0108 Gross per 24 hour  Intake 419 ml  Output 425 ml  Net -6 ml   Filed Weights   12/14/23 1348  Weight: 100.3 kg    Examination: General: Adult male, resting  comfortably in bed, in NAD. Neuro: A&O x 3, no deficits. HEENT: [place]/AT. Sclerae anicteric. EOMI. Cardiovascular: RRR, no M/R/G.  Lungs: Respirations even and unlabored.  CTA bilaterally, No W/R/R. Abdomen: BS x 4, soft, NT/ND.  Musculoskeletal: No gross deformities, no edema.    Assessment and Plan   Acute hypoxic and hypercapnic respiratory failure - presumed 2/2 PNA. Suspect some component of chronic hypoxia thought he states he has never required home O2 Community acquire pneumonia, POA - RVP neg Acute exacerbation of chronic COPD, POA -Patient states he was visiting with fmaily for the holiday and when they all started to smoke it triggered me  -Reports he uses his rescue albuterol  upward of 6 times per days and often uses his scheduled Breztri  whenever I need it  Concern for [facility] - last echo April 2024 with mildly elevated PASP with RVSP ; though repeat 12/3 was stable. Daily tobacco use - States he smoke 6 cigarettes per day  P: Continue supplemental oxygen for sat goal greater than 88%. Not wanting to wear O2 but not as much a concern at rest as maintains sats. However, he desats quickly with exertion. I explained he will likely need home O2 at least with activity to which he didn't seem very happy about. BiPAP PRN though he has yet to comply with it Continue empiric Azithromycin , Ceftriaxone  Continue triple neb therapy  Diureses as needed, currently seems euvolemic Aspiration precautions  Mobilize  ? Pulm rehab He has outpatient follow up scheduled for 12/9 with our office.  Rest per primary team. Nothing further to add.  PCCM will sign off.  Please call us  back if we can be of any further assistance.    Sammi Gore, PA - [facility] For pager details, please see AMION or use Epic chat  After 1900, please call [facility] for cross coverage needs 12/18/2023, 9:10 AM

## 2023-12-18 NOTE — Hospital Course (Signed)
 68 year old man cigarette smoker presenting with shortness of breath.  Admitted for acute on chronic hypoxic and hypercapnic respiratory failure, pneumonia, COPD exacerbation.  Consultants PCCM  Procedures/Events 11/30 admission for respiratory failure 12/1 PCCM consult

## 2023-12-19 ENCOUNTER — Other Ambulatory Visit (HOSPITAL_COMMUNITY): Payer: Self-pay

## 2023-12-19 LAB — PHOSPHORUS: Phosphorus: 2.8 mg/dL (ref 2.5–4.6)

## 2023-12-19 MED ORDER — ALBUTEROL SULFATE HFA 108 (90 BASE) MCG/ACT IN AERS
2.0000 | INHALATION_SPRAY | Freq: Four times a day (QID) | RESPIRATORY_TRACT | 2 refills | Status: DC | PRN
  Filled 2023-12-19: qty 6.7, 20d supply, fill #0
  Filled 2023-12-19: qty 20.1, 75d supply, fill #0

## 2023-12-19 NOTE — Progress Notes (Signed)
 Albuterol  inhaler not due for a refill- patient states he used 2 of them and lost the third one. Secure chat sent to [facility] to check on options. Pharmacist was able to override with a lost option to fill the albuterol  inhaler

## 2023-12-19 NOTE — TOC Transition Note (Signed)
 Transition of Care [facility]) - Discharge Note   Patient Details  Name: [name] MRN: [id number] Date of Birth: 1955-06-30  Transition of Care [facility] CM/SW Contact:  Tawni CHRISTELLA Eva, LCSW Phone Number: 12/19/2023, 10:58 AM   Clinical Narrative:     CSW met with pt to discuss home O2 rec, pt is agreeable, referral was made with [employer] for oxygen to be delivered to pt's room prior to d/c. Pt reports he will call a family member for transport upon d/c. ICM sign off.   Final next level of care: Home/Self Care Barriers to Discharge: Barriers Resolved   Patient Goals and CMS Choice Patient states their goals for this hospitalization and ongoing recovery are:: retun home CMS Medicare.gov Compare Post Acute Care list provided to:: Patient Choice offered to / list presented to : Patient [place] ownership interest in [facility].provided to:: Patient    Discharge Placement                    Patient and family notified of of transfer: 12/19/23  Discharge Plan and Services Additional resources added to the After Visit Summary for   In-house Referral: NA Discharge Planning Services: CM Consult Post Acute Care Choice: NA          DME Arranged: Oxygen DME Agency: [employer] Date DME Agency Contacted: 12/19/23 Time DME Agency Contacted: 1056 Representative spoke with at DME Agency: [facility] HH Arranged: NA HH Agency: NA        Social Drivers of Health (SDOH) Interventions SDOH Screenings   Food Insecurity: No Food Insecurity (12/14/2023)  Housing: High Risk (12/14/2023)  Transportation Needs: No Transportation Needs (12/14/2023)  Utilities: Not At Risk (12/14/2023)  Alcohol Screen: Low Risk  (05/06/2022)  Depression (PHQ2-9): Low Risk  (01/09/2023)  Financial Resource Strain: Medium Risk (01/09/2023)  Physical Activity: Insufficiently Active (01/09/2023)  Social Connections: Unknown (12/14/2023)  Stress: No Stress Concern Present (05/06/2022)   Tobacco Use: High Risk (12/14/2023)  Health Literacy: Adequate Health Literacy (01/09/2023)     Readmission Risk Interventions    12/17/2023    3:01 PM 12/16/2022   11:30 AM 05/13/2022   10:11 AM  Readmission Risk Prevention Plan  Post Dischage Appt Complete Complete Complete  Medication Screening Complete Complete Complete  Transportation Screening Complete Complete Complete

## 2023-12-19 NOTE — Discharge Summary (Addendum)
 Physician Discharge Summary   Patient: [name] MRN: [id number] DOB: 23-Aug-1955  Admit date:     12/14/2023  Discharge date: 12/19/23  Discharge Physician: Toribio Door   PCP: Patient, No Pcp Per   Recommendations at discharge:  Started on oxygen for COPD.  See discussion below.  Discharge Diagnoses: Principal Problem:   Acute respiratory failure with hypoxia and hypercapnia (HCC) Active Problems:   Tobacco abuse   Essential hypertension   Lobar pneumonia   Hyperlipidemia   COPD exacerbation (HCC)   Grade I diastolic dysfunction   Class 1 obesity  Resolved Problems:   * No resolved hospital problems. *  Hospital Course: 68 year old man cigarette smoker presenting with shortness of breath.  Admitted for acute on chronic hypoxic and hypercapnic respiratory failure, pneumonia, COPD exacerbation.  Seen by pulmonary medicine.  Treated with empiric antibiotics, bronchodilators and steroids with gradual clinical improvement.  Consultants PCCM  Procedures/Events 11/30 admission for respiratory failure 12/1 PCCM consult  Acute hypoxic and hypercapnic respiratory failure COPD exacerbation Lobar pneumonia, left lower lobe Cigarette smoker Overall improved.  Continue bronchodilators.  Completed antibiotics and prednisone .  Will need home oxygen as below. Patient Saturations on Room Air at Rest = 94% Patient Saturations on [employer] while Ambulating = 79% Patient Saturations on 6 Liters of oxygen while Ambulating = 90% Patient desaturates quickly with any activity.  He has outpatient follow up scheduled for 12/9 with PCCM office    Chronic diastolic CHF Appears compensated.  Echo stable.   Elevated troponin Mild elevation consistent with shortness of breath.  No evidence of ACS.   Essential hypertension Stable   Class 1 obesity: BMI 30.0-34.9 kg/m Body mass index is 32.65 kg/m.     Blood culture positive, contaminant.   Disposition: Home Diet  recommendation:  Regular diet DISCHARGE MEDICATION: Allergies as of 12/19/2023       Reactions   Codeine Itching   Penicillins Itching, Other (See Comments)        Medication List     STOP taking these medications    aspirin  EC 81 MG tablet       TAKE these medications    albuterol  108 (90 Base) MCG/ACT inhaler Commonly known as: Ventolin  HFA Inhale 2 puffs into the lungs every 6 (six) hours as needed for wheezing or shortness of breath. What changed: Another medication with the same name was removed. Continue taking this medication, and follow the directions you see here.   atorvastatin  40 MG tablet Commonly known as: LIPITOR Take 1 tablet (40 mg total) by mouth daily for high cholesterol and heart health .   Breztri  Aerosphere 160-9-4.8 MCG/ACT Aero inhaler Generic drug: budesonide -glycopyrrolate -formoterol  Inhale 2 puffs into the lungs in the morning and at bedtime. What changed: Another medication with the same name was removed. Continue taking this medication, and follow the directions you see here.   fluticasone  50 MCG/ACT nasal spray Commonly known as: FLONASE  Place 2 sprays into both nostrils daily for seasonal allergies and flare up What changed:  when to take this reasons to take this   furosemide  20 MG tablet Commonly known as: LASIX  Take 1 tablet (20 mg total) by mouth in the morning high blood pressure and swelling . What changed: Another medication with the same name was removed. Continue taking this medication, and follow the directions you see here.   ipratropium-albuterol  0.5-2.5 (3) MG/3ML Soln Commonly known as: DUONEB Take 3 mLs by nebulization in the morning, at noon, in the evening, and  at bedtime as needed for moderate to severe shortness of breath, COPD flare up or wheezing   losartan  25 MG tablet Commonly known as: COZAAR  Take 1 tablet (25 mg total) by mouth in the morning for high blood pressure What changed:  when to take  this reasons to take this   montelukast  10 MG tablet Commonly known as: SINGULAIR  Take 1 tablet (10 mg total) by mouth every evening for flare up and seasonal allergies. What changed:  when to take this reasons to take this               Durable Medical Equipment  (From admission, onward)           Start     Ordered   12/19/23 0908  For home use only DME oxygen  Once       Question Answer Comment  Length of Need 6 Months   Mode or (Route) Nasal cannula   Liters per Minute 6   Frequency Continuous (stationary and portable oxygen unit needed)   Oxygen delivery system: Gas   Oxygen delivery system: Concentrator   Oxygen delivery system: Portable concentrator (POC)      12/19/23 0907            Contact information for follow-up providers     [facility]. Schedule an appointment as soon as possible for a visit in 1 week(s).   Specialty: Pulmonology Contact information: [address] [place] [address] [place]  [phone] Additional information: [address]  [address]  [place], [place] [address]             Contact information for after-discharge care     Durable Medical Equipment     [facility] [employer] [facility] (DME) .   Service: Durable Medical Equipment Contact information: [address] [place]   [address] [phone]                    Discharge Exam: Fredricka Weights   12/14/23 1348 12/19/23 0500  Weight: 100.3 kg 102.3 kg   Physical Exam Vitals reviewed.  Constitutional:      General: He is not in acute distress.    Appearance: He is not ill-appearing or toxic-appearing.  Cardiovascular:     Rate and Rhythm: Normal rate and regular rhythm.     Heart sounds: No murmur heard. Pulmonary:     Effort: Pulmonary effort is normal. No respiratory distress.     Breath sounds: No wheezing, rhonchi or rales.  Neurological:     Mental Status:  He is alert.  Psychiatric:        Mood and Affect: Mood normal.        Behavior: Behavior normal.      Condition at discharge: good  The results of significant diagnostics from this hospitalization (including imaging, microbiology, ancillary and laboratory) are listed below for reference.   Imaging Studies: ECHOCARDIOGRAM COMPLETE Result Date: 12/17/2023    ECHOCARDIOGRAM REPORT   Patient Name:   CLEMON DEVAUL Date of Exam: 12/17/2023 Medical Rec #:  [id number]           Height:       69.0 in Accession #:    [id number]          Weight:       221.1 lb Date of Birth:  [birth year]          BSA:  2.156 m Patient Age:    68 years            BP:           119/57 mmHg Patient Gender: M                   HR:           93 bpm. Exam Location:  Inpatient Procedure: 2D Echo and Saline Contrast Bubble Study (Both Spectral and Color            Flow Doppler were utilized during procedure). Indications:    Hypoxia  History:        Patient has prior history of Echocardiogram examinations. COPD.  Sonographer:    Charmaine Gaskins Referring Phys: [id number] RAHUL P DESAI  Sonographer Comments: Technically difficult study due to poor echo windows. Image acquisition challenging due to COPD. IMPRESSIONS  1. Left ventricular ejection fraction, by estimation, is 65 to 70%. Left ventricular ejection fraction by 2D MOD biplane is 65.4 %. The left ventricle has normal function. The left ventricle has no regional wall motion abnormalities. Left ventricular diastolic parameters were normal.  2. Right ventricular systolic function is normal. The right ventricular size is normal.  3. The mitral valve is normal in structure. No evidence of mitral valve regurgitation. No evidence of mitral stenosis.  4. The aortic valve is normal in structure. Aortic valve regurgitation is not visualized. Aortic valve sclerosis/calcification is present, without any evidence of aortic stenosis.  5. The inferior vena cava is dilated in size with  >50% respiratory variability, suggesting right atrial pressure of 8 mmHg. Comparison(s): No significant change from prior study. FINDINGS  Left Ventricle: Left ventricular ejection fraction, by estimation, is 65 to 70%. Left ventricular ejection fraction by 2D MOD biplane is 65.4 %. The left ventricle has normal function. The left ventricle has no regional wall motion abnormalities. The left ventricular internal cavity size was normal in size. There is no left ventricular hypertrophy. Left ventricular diastolic parameters were normal. Right Ventricle: The right ventricular size is normal. No increase in right ventricular wall thickness. Right ventricular systolic function is normal. Left Atrium: Left atrial size was normal in size. Right Atrium: Right atrial size was normal in size. Pericardium: There is no evidence of pericardial effusion. Mitral Valve: The mitral valve is normal in structure. No evidence of mitral valve regurgitation. No evidence of mitral valve stenosis. Tricuspid Valve: The tricuspid valve is normal in structure. Tricuspid valve regurgitation is not demonstrated. No evidence of tricuspid stenosis. Aortic Valve: The aortic valve is normal in structure. Aortic valve regurgitation is not visualized. Aortic valve sclerosis/calcification is present, without any evidence of aortic stenosis. Aortic valve mean gradient measures 10.0 mmHg. Aortic valve peak gradient measures 16.8 mmHg. Aortic valve area, by VTI measures 2.23 cm. Pulmonic Valve: The pulmonic valve was not well visualized. Pulmonic valve regurgitation is not visualized. No evidence of pulmonic stenosis. Aorta: The aortic root is normal in size and structure. Venous: The inferior vena cava is dilated in size with greater than 50% respiratory variability, suggesting right atrial pressure of 8 mmHg. IAS/Shunts: No atrial level shunt detected by color flow Doppler. Agitated saline contrast was given intravenously to evaluate for intracardiac  shunting.  LEFT VENTRICLE PLAX 2D                        Biplane EF (MOD) LVIDd:         4.90 cm  LV Biplane EF:   Left LVIDs:         3.40 cm                          ventricular LV PW:         1.10 cm                          ejection LV IVS:        1.20 cm                          fraction by LVOT diam:     2.00 cm                          2D MOD LV SV:         79                               biplane is LV SV Index:   37                               65.4 %. LVOT Area:     3.14 cm                                Diastology                                LV e' medial:    6.53 cm/s LV Volumes (MOD)               LV E/e' medial:  11.8 LV vol d, MOD    88.7 ml       LV e' lateral:   10.90 cm/s A2C:                           LV E/e' lateral: 7.1 LV vol d, MOD    80.0 ml A4C: LV vol s, MOD    29.6 ml A2C: LV vol s, MOD    29.4 ml A4C: LV SV MOD A2C:   59.1 ml LV SV MOD A4C:   80.0 ml LV SV MOD BP:    55.7 ml RIGHT VENTRICLE RV Basal diam:  3.00 cm RV Mid diam:    3.10 cm RV S prime:     15.30 cm/s LEFT ATRIUM             Index        RIGHT ATRIUM           Index LA diam:        3.20 cm 1.48 cm/m   RA Area:     18.20 cm LA Vol (A2C):   65.3 ml 30.29 ml/m  RA Volume:   50.90 ml  23.61 ml/m LA Vol (A4C):   58.3 ml 27.04 ml/m LA Biplane Vol: 62.5 ml 28.99 ml/m  AORTIC VALVE AV Area (Vmax):    1.99 cm AV Area (Vmean):   1.87 cm AV Area (VTI):     2.23 cm AV Vmax:  205.00 cm/s AV Vmean:          155.000 cm/s AV VTI:            0.357 m AV Peak Grad:      16.8 mmHg AV Mean Grad:      10.0 mmHg LVOT Vmax:         130.00 cm/s LVOT Vmean:        92.500 cm/s LVOT VTI:          0.253 m LVOT/AV VTI ratio: 0.71  AORTA Ao Root diam: 3.10 cm Ao Asc diam:  3.00 cm MITRAL VALVE MV Area (PHT): 3.49 cm    SHUNTS MV E velocity: 77.10 cm/s  Systemic VTI:  0.25 m MV A velocity: 96.80 cm/s  Systemic Diam: 2.00 cm MV E/A ratio:  0.80 Franck Azobou Tonleu Electronically signed by Joelle Cedars Tonleu Signature  Date/Time: 12/17/2023/3:15:21 PM    Final    DG CHEST PORT 1 VIEW Result Date: 12/16/2023 CLINICAL DATA:  Shortness of breath. EXAM: PORTABLE CHEST 1 VIEW COMPARISON:  12/14/2023 FINDINGS: Lungs are adequately inflated and demonstrate persistent hazy airspace opacification over the left mid to lower lung with slight interval improved aeration. Right lung is clear. No effusion. Mediastinal silhouette and remainder of the exam is unchanged. IMPRESSION: Persistent hazy airspace opacification over the left mid to lower lung with slight interval improved aeration. Electronically Signed   By: Toribio Agreste M.D.   On: 12/16/2023 09:08   CT CHEST WO CONTRAST Result Date: 12/14/2023 EXAM: CT CHEST WITHOUT CONTRAST 12/08/2023 01:35:00 PM TECHNIQUE: CT of the chest was performed without the administration of intravenous contrast. Multiplanar reformatted images are provided for review. Automated exposure control, iterative reconstruction, and/or weight based adjustment of the mA/kV was utilized to reduce the radiation dose to as low as reasonably achievable. COMPARISON: Head CT fusion study dated 09/03/2023 and CT of the chest dated 08/07/2023. CLINICAL HISTORY: Lung nodules, multiple. FINDINGS: MEDIASTINUM: Heart and pericardium are unremarkable. There is mild calcific coronary artery disease present. The thoracic aorta also demonstrates mild calcific atheromatous disease. The central airways are clear. LYMPH NODES: No mediastinal, hilar or axillary lymphadenopathy. LUNGS AND PLEURA: The nodular opacity previously noted posterolaterally within the left costophrenic sulcus has continued to improve and no longer has a nodular appearance, likely reflecting a nearly completely resolved infectious or inflammatory process. The atypical cystic areas within the left upper lobe and within the superior segment of the right lower lobe have not changed significantly in size or appearance in the interim. An area of irregular  reticulation present medially within the right lower lobe on image 90 of series 7, also appears unchanged. There is mild central lobular and paraseptal emphysema. No pleural effusion or pneumothorax. SOFT TISSUES/BONES: No acute abnormality of the bones or soft tissues. UPPER ABDOMEN: Limited images of the upper abdomen demonstrates no acute abnormality. IMPRESSION: 1. Nodular opacity in the left costophrenic sulcus has improved, likely representing a nearly resolved infectious or inflammatory process. 2. Stable atypical cystic areas in the left upper lobe and superior segment of the right lower lobe. The lesions merit continued follow up. 3. Stable irregular reticulation in the right lower lobe. 4. Mild central lobular and paraseptal emphysema. Electronically signed by: Evalene Coho MD 12/14/2023 07:50 AM EST RP Workstation: [id number]   DG Chest Portable 1 View Result Date: 12/14/2023 EXAM: 1 VIEW XRAY OF THE CHEST 12/14/2023 07:25:00 AM COMPARISON: 12/12/2022 CLINICAL HISTORY: SOB FINDINGS: LUNGS AND PLEURA: Left basilar patchy opacities. No  pleural effusion. No pneumothorax. HEART AND MEDIASTINUM: No acute abnormality of the cardiac and mediastinal silhouettes. BONES AND SOFT TISSUES: No acute osseous abnormality. IMPRESSION: 1. Left basilar hazy and patchy areas of opacification/consolidation . Electronically signed by: Evalene Coho MD 12/14/2023 07:35 AM EST RP Workstation: [id number]    Microbiology: Results for orders placed or performed during the hospital encounter of 12/14/23  Resp panel by RT-PCR (RSV, Flu A&B, Covid) Anterior Nasal Swab     Status: None   Collection Time: 12/14/23  7:35 AM   Specimen: Anterior Nasal Swab  Result Value Ref Range Status   SARS Coronavirus 2 by RT PCR NEGATIVE NEGATIVE Final    Comment: (NOTE) SARS-CoV-2 target nucleic acids are NOT DETECTED.  The SARS-CoV-2 RNA is generally detectable in upper respiratory specimens during the acute phase of  infection. The lowest concentration of SARS-CoV-2 viral copies this assay can detect is 138 copies/mL. A negative result does not preclude SARS-Cov-2 infection and should not be used as the sole basis for treatment or other patient management decisions. A negative result may occur with  improper specimen collection/handling, submission of specimen other than nasopharyngeal swab, presence of viral mutation(s) within the areas targeted by this assay, and inadequate number of viral copies(<138 copies/mL). A negative result must be combined with clinical observations, patient history, and epidemiological information. The expected result is Negative.  Fact Sheet for Patients:  bloggercourse.com  Fact Sheet for Healthcare Providers:  seriousbroker.it  This test is no t yet approved or cleared by the [place]  FDA and  has been authorized for detection and/or diagnosis of SARS-CoV-2 by FDA under an Emergency Use Authorization (EUA). This EUA will remain  in effect (meaning this test can be used) for the duration of the COVID-19 declaration under Section 564(b)(1) of the Act, 21 U.S.C.section 360bbb-3(b)(1), unless the authorization is terminated  or revoked sooner.       Influenza A by PCR NEGATIVE NEGATIVE Final   Influenza B by PCR NEGATIVE NEGATIVE Final    Comment: (NOTE) The Xpert Xpress SARS-CoV-2/FLU/RSV plus assay is intended as an aid in the diagnosis of influenza from Nasopharyngeal swab specimens and should not be used as a sole basis for treatment. Nasal washings and aspirates are unacceptable for Xpert Xpress SARS-CoV-2/FLU/RSV testing.  Fact Sheet for Patients: bloggercourse.com  Fact Sheet for Healthcare Providers: seriousbroker.it  This test is not yet approved or cleared by the [place]  FDA and has been authorized for detection and/or diagnosis of SARS-CoV-2  by FDA under an Emergency Use Authorization (EUA). This EUA will remain in effect (meaning this test can be used) for the duration of the COVID-19 declaration under Section 564(b)(1) of the Act, 21 U.S.C. section 360bbb-3(b)(1), unless the authorization is terminated or revoked.     Resp Syncytial Virus by PCR NEGATIVE NEGATIVE Final    Comment: (NOTE) Fact Sheet for Patients: bloggercourse.com  Fact Sheet for Healthcare Providers: seriousbroker.it  This test is not yet approved or cleared by the [place]  FDA and has been authorized for detection and/or diagnosis of SARS-CoV-2 by FDA under an Emergency Use Authorization (EUA). This EUA will remain in effect (meaning this test can be used) for the duration of the COVID-19 declaration under Section 564(b)(1) of the Act, 21 U.S.C. section 360bbb-3(b)(1), unless the authorization is terminated or revoked.  Performed at [facility], [address] [place], [place] [address]   Culture, blood (single)     Status: Abnormal   Collection Time: 12/14/23  12:37 PM   Specimen: BLOOD LEFT ARM  Result Value Ref Range Status   Specimen Description   Final    BLOOD LEFT ARM BOTTLES DRAWN AEROBIC AND ANAEROBIC Performed at [facility], [address], [place], [place] [address]    Special Requests   Final    Blood Culture adequate volume Performed at [facility], [address] [place], [place] [address]    Culture  Setup Time   Final    GRAM POSITIVE COCCI IN CLUSTERS ANAEROBIC BOTTLE ONLY CRITICAL RESULT CALLED TO, READ BACK BY AND VERIFIED WITH: PHARMD MARY SWAYNE ON 12/15/23 @ 1441 BY DRT    Culture (A)  Final    STAPHYLOCOCCUS CAPITIS THE SIGNIFICANCE OF ISOLATING THIS ORGANISM FROM A SINGLE SET OF BLOOD CULTURES WHEN MULTIPLE SETS ARE DRAWN IS UNCERTAIN. PLEASE NOTIFY THE MICROBIOLOGY DEPARTMENT WITHIN ONE WEEK IF  SPECIATION AND SENSITIVITIES ARE REQUIRED. Performed at [facility], [address] [place], [place] [address]    Report Status 12/17/2023 FINAL  Final  Blood Culture ID Panel (Reflexed)     Status: Abnormal   Collection Time: 12/14/23 12:37 PM  Result Value Ref Range Status   Enterococcus faecalis NOT DETECTED NOT DETECTED Final   Enterococcus Faecium NOT DETECTED NOT DETECTED Final   Listeria monocytogenes NOT DETECTED NOT DETECTED Final   Staphylococcus species DETECTED (A) NOT DETECTED Final    Comment: CRITICAL RESULT CALLED TO, READ BACK BY AND VERIFIED WITH: PHARMD MARY SWAYNE ON 12/15/23 @ 1441 BY DRT    Staphylococcus aureus (BCID) NOT DETECTED NOT DETECTED Final   Staphylococcus epidermidis NOT DETECTED NOT DETECTED Final   Staphylococcus lugdunensis NOT DETECTED NOT DETECTED Final   Streptococcus species NOT DETECTED NOT DETECTED Final   Streptococcus agalactiae NOT DETECTED NOT DETECTED Final   Streptococcus pneumoniae NOT DETECTED NOT DETECTED Final   Streptococcus pyogenes NOT DETECTED NOT DETECTED Final   A.calcoaceticus-baumannii NOT DETECTED NOT DETECTED Final   Bacteroides fragilis NOT DETECTED NOT DETECTED Final   Enterobacterales NOT DETECTED NOT DETECTED Final   Enterobacter cloacae complex NOT DETECTED NOT DETECTED Final   Escherichia coli NOT DETECTED NOT DETECTED Final   Klebsiella aerogenes NOT DETECTED NOT DETECTED Final   Klebsiella oxytoca NOT DETECTED NOT DETECTED Final   Klebsiella pneumoniae NOT DETECTED NOT DETECTED Final   Proteus species NOT DETECTED NOT DETECTED Final   Salmonella species NOT DETECTED NOT DETECTED Final   Serratia marcescens NOT DETECTED NOT DETECTED Final   Haemophilus influenzae NOT DETECTED NOT DETECTED Final   Neisseria meningitidis NOT DETECTED NOT DETECTED Final   Pseudomonas aeruginosa NOT DETECTED NOT DETECTED Final   Stenotrophomonas maltophilia NOT DETECTED NOT DETECTED Final   Candida albicans NOT DETECTED NOT  DETECTED Final   Candida auris NOT DETECTED NOT DETECTED Final   Candida glabrata NOT DETECTED NOT DETECTED Final   Candida krusei NOT DETECTED NOT DETECTED Final   Candida parapsilosis NOT DETECTED NOT DETECTED Final   Candida tropicalis NOT DETECTED NOT DETECTED Final   Cryptococcus neoformans/gattii NOT DETECTED NOT DETECTED Final    Comment: Performed at [facility], [address], [place], [place] [address]  Respiratory (~20 pathogens) panel by PCR     Status: None   Collection Time: 12/15/23  9:25 AM   Specimen: Nasopharyngeal Swab; Respiratory  Result Value Ref Range Status   Adenovirus NOT DETECTED NOT DETECTED Final   Coronavirus 229E NOT DETECTED NOT DETECTED Final    Comment: (  NOTE) The Coronavirus on the Respiratory Panel, DOES NOT test for the novel  Coronavirus (2019 nCoV)    Coronavirus HKU1 NOT DETECTED NOT DETECTED Final   Coronavirus NL63 NOT DETECTED NOT DETECTED Final   Coronavirus OC43 NOT DETECTED NOT DETECTED Final   Metapneumovirus NOT DETECTED NOT DETECTED Final   Rhinovirus / Enterovirus NOT DETECTED NOT DETECTED Final   Influenza A NOT DETECTED NOT DETECTED Final   Influenza B NOT DETECTED NOT DETECTED Final   Parainfluenza Virus 1 NOT DETECTED NOT DETECTED Final   Parainfluenza Virus 2 NOT DETECTED NOT DETECTED Final   Parainfluenza Virus 3 NOT DETECTED NOT DETECTED Final   Parainfluenza Virus 4 NOT DETECTED NOT DETECTED Final   Respiratory Syncytial Virus NOT DETECTED NOT DETECTED Final   Bordetella pertussis NOT DETECTED NOT DETECTED Final   Bordetella Parapertussis NOT DETECTED NOT DETECTED Final   Chlamydophila pneumoniae NOT DETECTED NOT DETECTED Final   Mycoplasma pneumoniae NOT DETECTED NOT DETECTED Final    Comment: Performed at [facility], [address], [place], [place] [address]  Expectorated Sputum Assessment w Gram Stain, Rflx to Resp Cult     Status: None   Collection Time: 12/15/23 10:32 AM   Specimen: Sputum  Result  Value Ref Range Status   Specimen Description SPUTUM  Final   Special Requests NONE  Final   Sputum evaluation   Final    THIS SPECIMEN IS ACCEPTABLE FOR SPUTUM CULTURE Performed at [facility], [address], [place], [place] [address]    Report Status 12/15/2023 FINAL  Final  Culture, Respiratory w Gram Stain     Status: None   Collection Time: 12/15/23 10:32 AM   Specimen: SPU  Result Value Ref Range Status   Specimen Description   Final    SPUTUM Performed at [facility], [address], [place], [place] [address]    Special Requests   Final    NONE Reflexed from [id number] Performed at [facility], [address] [place], [place] [address]    Gram Stain   Final    ABUNDANT WBC PRESENT, PREDOMINANTLY PMN NO ORGANISMS SEEN    Culture   Final    Normal respiratory flora-no Staph aureus or Pseudomonas seen Performed at [facility], [address] [place], [place] [address]    Report Status 12/17/2023 FINAL  Final  Culture, blood (Routine X 2) w Reflex to ID Panel     Status: None (Preliminary result)   Collection Time: 12/15/23  3:33 PM   Specimen: BLOOD RIGHT ARM  Result Value Ref Range Status   Specimen Description   Final    BLOOD RIGHT ARM Performed at [facility], [address] [place], [place] [address]    Special Requests   Final    BOTTLES DRAWN AEROBIC AND ANAEROBIC Blood Culture adequate volume Performed at [facility], [address], [place], [place] [address]    Culture   Final    NO GROWTH 4 DAYS Performed at [facility], [address], [place], [place] [address]    Report Status PENDING  Incomplete  Culture, blood (Routine X 2) w Reflex to ID Panel     Status: None (Preliminary result)   Collection Time: 12/15/23  3:42 PM   Specimen: BLOOD RIGHT ARM  Result Value Ref Range Status   Specimen Description   Final    BLOOD RIGHT ARM Performed at [facility], [address] [place], [place] [address]    Special Requests   Final    BOTTLES DRAWN AEROBIC AND ANAEROBIC Blood Culture results may not be optimal due to an inadequate volume of blood received in culture bottles Performed at [facility], [address], [place], [place] [address]    Culture   Final    NO GROWTH 4 DAYS Performed at [facility], [address] [place], [place] [address]    Report Status PENDING  Incomplete    Labs: CBC: Recent Labs  Lab 12/14/23 0705 12/15/23 0247 12/16/23 0304 12/17/23 0301 12/18/23 0302  WBC 11.3* 11.8* 9.7 8.1 8.5  NEUTROABS 8.5*  --   --   --   --   HGB 16.5 15.2 13.5 13.5 13.0  HCT 50.2 47.1 43.2 41.6 39.7  MCV 98.0 100.2* 102.4* 100.5* 97.5  PLT 153 130* 147* 180 190   Basic Metabolic Panel: Recent Labs  Lab 12/14/23 0705 12/15/23 0247 12/16/23 0304 12/17/23 0301 12/18/23 0302 12/19/23 0412  NA 136 136 135 138 139  --   K 3.9 5.0 4.4 4.0 3.5  --   CL 97* 98 99 101 102  --   CO2 31 33* 31 31 30   --   GLUCOSE 114* 123* 99 124* 125*  --   BUN 9 12 18 19 15   --   CREATININE 1.00 0.99 0.97 0.84 0.78  --   CALCIUM  9.3 8.8* 8.7* 8.6* 8.5*  --   MG 2.1  --   --   --  2.5*  --   PHOS 3.1  --   --   --  1.8* 2.8   Liver Function Tests: Recent Labs  Lab 12/14/23 0705 12/15/23 0247 12/18/23 0302  AST 29 18  --   ALT 18 16  --   ALKPHOS 105 87  --   BILITOT 1.5* 0.4  --   PROT 7.0 6.3*  --   ALBUMIN 4.1 3.5 3.0*   CBG: No results for input(s): GLUCAP in the last 168 hours.  Discharge time spent: less than 30 minutes.  Signed: Toribio Door, MD [facility] 12/19/2023

## 2023-12-19 NOTE — Progress Notes (Addendum)
 Albuterol  inhaler delivered to patient in room by this RN. Patient to lobby via w/c with 4 O2 travel tanks & concentrator for home. Patient home with wife.

## 2023-12-19 NOTE — Plan of Care (Signed)
  Problem: Education: Goal: Knowledge of disease or condition will improve Outcome: Progressing Goal: Knowledge of the prescribed therapeutic regimen will improve Outcome: Progressing Goal: Individualized Educational Video(s) Outcome: Progressing   Problem: Activity: Goal: Ability to tolerate increased activity will improve Outcome: Progressing Goal: Will verbalize the importance of balancing activity with adequate rest periods Outcome: Progressing   Problem: Respiratory: Goal: Ability to maintain a clear airway will improve Outcome: Progressing Goal: Levels of oxygenation will improve Outcome: Progressing Goal: Ability to maintain adequate ventilation will improve Outcome: Progressing   Problem: Activity: Goal: Ability to tolerate increased activity will improve Outcome: Progressing   Problem: Clinical Measurements: Goal: Ability to maintain a body temperature in the normal range will improve Outcome: Progressing   Problem: Respiratory: Goal: Ability to maintain adequate ventilation will improve Outcome: Progressing Goal: Ability to maintain a clear airway will improve Outcome: Progressing   Problem: Education: Goal: Knowledge of General Education information will improve Description: Including pain rating scale, medication(s)/side effects and non-pharmacologic comfort measures Outcome: Progressing   Problem: Health Behavior/Discharge Planning: Goal: Ability to manage health-related needs will improve Outcome: Progressing   Problem: Clinical Measurements: Goal: Ability to maintain clinical measurements within normal limits will improve Outcome: Progressing Goal: Will remain free from infection Outcome: Progressing Goal: Diagnostic test results will improve Outcome: Progressing Goal: Respiratory complications will improve Outcome: Progressing Goal: Cardiovascular complication will be avoided Outcome: Progressing   Problem: Activity: Goal: Risk for activity  intolerance will decrease Outcome: Progressing   Problem: Nutrition: Goal: Adequate nutrition will be maintained Outcome: Progressing   Problem: Coping: Goal: Level of anxiety will decrease Outcome: Progressing   Problem: Elimination: Goal: Will not experience complications related to bowel motility Outcome: Progressing Goal: Will not experience complications related to urinary retention Outcome: Progressing   Problem: Pain Managment: Goal: General experience of comfort will improve and/or be controlled Outcome: Progressing   Problem: Safety: Goal: Ability to remain free from injury will improve Outcome: Progressing   Problem: Skin Integrity: Goal: Risk for impaired skin integrity will decrease Outcome: Progressing

## 2023-12-19 NOTE — Plan of Care (Signed)
 Problem: Education: Goal: Knowledge of disease or condition will improve 12/19/2023 1251 by Rosanne Elspeth HERO, RN Outcome: Adequate for Discharge 12/19/2023 [phone] by Rosanne Elspeth HERO, RN Outcome: Progressing Goal: Knowledge of the prescribed therapeutic regimen will improve 12/19/2023 1251 by Rosanne Elspeth HERO, RN Outcome: Adequate for Discharge 12/19/2023 0839 by Rosanne Elspeth HERO, RN Outcome: Progressing Goal: Individualized Educational Video(s) 12/19/2023 1251 by Rosanne Elspeth HERO, RN Outcome: Adequate for Discharge 12/19/2023 0839 by Rosanne Elspeth HERO, RN Outcome: Progressing   Problem: Activity: Goal: Ability to tolerate increased activity will improve 12/19/2023 1251 by Rosanne Elspeth HERO, RN Outcome: Adequate for Discharge 12/19/2023 [phone] by Rosanne Elspeth HERO, RN Outcome: Progressing Goal: Will verbalize the importance of balancing activity with adequate rest periods 12/19/2023 1251 by Rosanne Elspeth HERO, RN Outcome: Adequate for Discharge 12/19/2023 [phone] by Rosanne Elspeth HERO, RN Outcome: Progressing   Problem: Respiratory: Goal: Ability to maintain a clear airway will improve 12/19/2023 1251 by Rosanne Elspeth HERO, RN Outcome: Adequate for Discharge 12/19/2023 0839 by Rosanne Elspeth HERO, RN Outcome: Progressing Goal: Levels of oxygenation will improve 12/19/2023 1251 by Rosanne Elspeth HERO, RN Outcome: Adequate for Discharge 12/19/2023 0839 by Rosanne Elspeth HERO, RN Outcome: Progressing Goal: Ability to maintain adequate ventilation will improve 12/19/2023 1251 by Rosanne Elspeth HERO, RN Outcome: Adequate for Discharge 12/19/2023 0839 by Rosanne Elspeth HERO, RN Outcome: Progressing   Problem: Activity: Goal: Ability to tolerate increased activity will improve 12/19/2023 1251 by Rosanne Elspeth HERO, RN Outcome: Adequate for Discharge 12/19/2023 [phone] by Rosanne Elspeth HERO, RN Outcome: Progressing   Problem: Clinical  Measurements: Goal: Ability to maintain a body temperature in the normal range will improve 12/19/2023 1251 by Rosanne Elspeth HERO, RN Outcome: Adequate for Discharge 12/19/2023 [phone] by Rosanne Elspeth HERO, RN Outcome: Progressing   Problem: Respiratory: Goal: Ability to maintain adequate ventilation will improve 12/19/2023 1251 by Rosanne Elspeth HERO, RN Outcome: Adequate for Discharge 12/19/2023 [phone] by Rosanne Elspeth HERO, RN Outcome: Progressing Goal: Ability to maintain a clear airway will improve 12/19/2023 1251 by Rosanne Elspeth HERO, RN Outcome: Adequate for Discharge 12/19/2023 [phone] by Rosanne Elspeth HERO, RN Outcome: Progressing   Problem: Education: Goal: Knowledge of General Education information will improve Description: Including pain rating scale, medication(s)/side effects and non-pharmacologic comfort measures 12/19/2023 1251 by Rosanne Elspeth HERO, RN Outcome: Adequate for Discharge 12/19/2023 [phone] by Rosanne Elspeth HERO, RN Outcome: Progressing   Problem: Health Behavior/Discharge Planning: Goal: Ability to manage health-related needs will improve 12/19/2023 1251 by Rosanne Elspeth HERO, RN Outcome: Adequate for Discharge 12/19/2023 [phone] by Rosanne Elspeth HERO, RN Outcome: Progressing   Problem: Clinical Measurements: Goal: Ability to maintain clinical measurements within normal limits will improve 12/19/2023 1251 by Rosanne Elspeth HERO, RN Outcome: Adequate for Discharge 12/19/2023 [phone] by Rosanne Elspeth HERO, RN Outcome: Progressing Goal: Will remain free from infection 12/19/2023 1251 by Rosanne Elspeth HERO, RN Outcome: Adequate for Discharge 12/19/2023 [phone] by Rosanne Elspeth HERO, RN Outcome: Progressing Goal: Diagnostic test results will improve 12/19/2023 1251 by Rosanne Elspeth HERO, RN Outcome: Adequate for Discharge 12/19/[phone] by Rosanne Elspeth HERO, RN Outcome: Progressing Goal: Respiratory complications will improve 12/19/2023 1251 by  Rosanne Elspeth HERO, RN Outcome: Adequate for Discharge 12/19/2023 [phone] by Rosanne Elspeth HERO, RN Outcome: Progressing Goal: Cardiovascular complication will be avoided 12/19/2023 1251 by Rosanne Elspeth HERO, RN Outcome: Adequate for Discharge 12/19/2023 [phone] by Rosanne Elspeth HERO, RN Outcome: Progressing   Problem: Activity: Goal: Risk for activity intolerance will decrease 12/19/2023 1251 by Rosanne Elspeth HERO,  RN Outcome: Adequate for Discharge 12/19/2023 0839 by Rosanne Elspeth HERO, RN Outcome: Progressing   Problem: Nutrition: Goal: Adequate nutrition will be maintained 12/19/2023 1251 by Rosanne Elspeth HERO, RN Outcome: Adequate for Discharge 12/19/2023 [phone] by Rosanne Elspeth HERO, RN Outcome: Progressing   Problem: Coping: Goal: Level of anxiety will decrease 12/19/2023 1251 by Rosanne Elspeth HERO, RN Outcome: Adequate for Discharge 12/19/2023 [phone] by Rosanne Elspeth HERO, RN Outcome: Progressing   Problem: Elimination: Goal: Will not experience complications related to bowel motility 12/19/2023 1251 by Rosanne Elspeth HERO, RN Outcome: Adequate for Discharge 12/19/2023 [phone] by Rosanne Elspeth HERO, RN Outcome: Progressing Goal: Will not experience complications related to urinary retention 12/19/2023 1251 by Rosanne Elspeth HERO, RN Outcome: Adequate for Discharge 12/19/2023 [phone] by Rosanne Elspeth HERO, RN Outcome: Progressing   Problem: Pain Managment: Goal: General experience of comfort will improve and/or be controlled 12/19/2023 1251 by Rosanne Elspeth HERO, RN Outcome: Adequate for Discharge 12/19/[phone] by Rosanne Elspeth HERO, RN Outcome: Progressing   Problem: Safety: Goal: Ability to remain free from injury will improve 12/19/2023 1251 by Rosanne Elspeth HERO, RN Outcome: Adequate for Discharge 12/19/2023 0839 by Rosanne Elspeth HERO, RN Outcome: Progressing   Problem: Skin Integrity: Goal: Risk for impaired skin integrity will  decrease 12/19/2023 1251 by Rosanne Elspeth HERO, RN Outcome: Adequate for Discharge 12/19/2023 [phone] by Rosanne Elspeth HERO, RN Outcome: Progressing

## 2023-12-20 LAB — CULTURE, BLOOD (ROUTINE X 2)
Culture: NO GROWTH
Culture: NO GROWTH
Special Requests: ADEQUATE

## 2023-12-21 LAB — BLOOD GAS, ARTERIAL
Acid-Base Excess: 8.4 mmol/L — ABNORMAL HIGH (ref 0.0–2.0)
Bicarbonate: 37.9 mmol/L — ABNORMAL HIGH (ref 20.0–28.0)
Drawn by: 21338
FIO2: 11 %
O2 Saturation: 98.6 %
Patient temperature: 37.6
pCO2 arterial: 79 mmHg (ref 32–48)
pH, Arterial: 7.29 — ABNORMAL LOW (ref 7.35–7.45)
pO2, Arterial: 91 mmHg (ref 83–108)

## 2023-12-23 ENCOUNTER — Encounter: Payer: Self-pay | Admitting: Acute Care

## 2023-12-23 ENCOUNTER — Telehealth: Payer: Self-pay | Admitting: Acute Care

## 2023-12-23 ENCOUNTER — Ambulatory Visit: Admitting: Acute Care

## 2023-12-23 VITALS — BP 144/76 | HR 105 | Temp 98.4°F | Ht 69.0 in | Wt 229.2 lb

## 2023-12-23 DIAGNOSIS — Z720 Tobacco use: Secondary | ICD-10-CM

## 2023-12-23 DIAGNOSIS — R918 Other nonspecific abnormal finding of lung field: Secondary | ICD-10-CM

## 2023-12-23 DIAGNOSIS — R911 Solitary pulmonary nodule: Secondary | ICD-10-CM

## 2023-12-23 DIAGNOSIS — Z09 Encounter for follow-up examination after completed treatment for conditions other than malignant neoplasm: Secondary | ICD-10-CM

## 2023-12-23 DIAGNOSIS — J449 Chronic obstructive pulmonary disease, unspecified: Secondary | ICD-10-CM

## 2023-12-23 DIAGNOSIS — R9389 Abnormal findings on diagnostic imaging of other specified body structures: Secondary | ICD-10-CM

## 2023-12-23 DIAGNOSIS — J9611 Chronic respiratory failure with hypoxia: Secondary | ICD-10-CM

## 2023-12-23 DIAGNOSIS — F17200 Nicotine dependence, unspecified, uncomplicated: Secondary | ICD-10-CM

## 2023-12-23 NOTE — Patient Instructions (Addendum)
 It is good to see you today. I am glad you are feeling better. Follow up in 1 month to make sure you are doing well.  We will consider doing Pulmonary Function Tests at that time.  Please wear oxygen at 6 L  with any activity. Saturations need to be 88% or higher at all times. Do not smoke while wearing oxygen.  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  We will do a 3 month follow up scan , due 2/25 or after to monitor these. Continue Breztri  2 puffs in the morning and 2 puffs in the evening. Rinse mouth after use.  Continue DuoNebs 4 times as day as needed for breakthrough shortness of breath or wheezing.  Please work on quitting smoking. You can receive free nicotine  replacement therapy (patches, gum, or mints) by calling 1-800-QUIT NOW. Please call so we can get you on the path to becoming a non-smoker. I know it is hard, but you can do this!  Hypnosis for smoking cessation  [employer] [phone]  Acupuncture for smoking cessation  [facility] [phone]   We will walk you today on the [employer]ogen to see if you qualify.  Call us  if you need us  sooner.  Note your daily symptoms > remember red flags for COPD:  Increase in cough, increase in sputum production, increase in shortness of breath or activity intolerance. If you notice these symptoms, please call to be seen.   Follow up with [facility] tomorrow as is scheduled.

## 2023-12-23 NOTE — Telephone Encounter (Signed)
 From [employer] in regards to DME order,I'm sorry but we are OOS on the poc's that go to a 6 pulse dose and unknown if we will get any more in. I got in contact with Mitch with [employer] in regards to the POC 6L. It's rare for a DME company to have 6L POC.  Patient would have to pay out of pocket. I let the patient know and they are aware. I have provided them [employer]'s number due to [employer]  having 6L. Patient is not interested in searching for 6L POC online. FYI

## 2023-12-23 NOTE — Progress Notes (Signed)
 History of Present Illness [name] is a 68 y.o. male male current every day smoker followed by Dr. Kara and the lung cancer screening program. He has been referred for an abnormal lung cancer screening scan.   Synopsis Pt. followed follow-up to the lung cancer screening program, was seen 07/15/2023 for an abnormal lung cancer screening scan.  The scan was done 06/02/2023 and read 06/21/2023.  It was read as a lung RADS 4B.  Radiology felt this may be infectious or inflammatory although patient does not recall being sick at the time he had the scan done.  He denied any weight loss or change in his breathing.  He denied hemoptysis.   He was treated with doxycycline  and repeat scan was done 1 month later to reevaluate the nodule.  There was no significant change or improvement after treatment with antibiotic therefore plan was to move forward with a PET scan to better evaluate for malignancy.  PET scan was negative for significant hypermetabolic activity.Plan was for a 3 month follow up CT Chest for Surveillance. He is here for follow up CT Chest.  Of Note, patient was recently admitted for Acute on chronic  Respiratory Failure with hypoxia and Hypercarbia , and COPD exacerbation from 12/14/2023-12/19/2023, 2/2 LLL pneumonia.He was seen by Pulmonary medicine, treated with empiric antibiotics , BD, and steroids. He refused oxygen and BiPAP in the hospital. He required 11 L HFNC as inpatient . ABG was 7.29/CO2 79/ Bicarb of 37.9 . He improved and was discharged home on home oxygen .  PMH includes COPD, asthma, tobacco use, HTN, HLD, obesity, colon polyps , and medical non compliance.   12/23/2023 Discussed the use of AI scribe software for clinical note transcription with the patient, who gave verbal consent to proceed.  History of Present Illness Pt. Presents for hospital follow up. ( See above). He was admitted for AECOPD. Treated with  Azithromycin , Ceftriaxone  and  triple neb therapy. He  will continue his Brezri and DuoNebs . He was still smoking 6 cigarettes a day prior to admission. He states he is compliant with his Breztri  BID, and he is using his Albuterol  6-8 times a day, and Duonebs as needed. He was not wearing his oxygen when he arrived today. His oxygen sats were 88%. He was placed on one of our tanks for the visit. He continued to require 6 L [place]  to maintain sats > 88%. We discussed that by not wearing his oxygen , and letting his oxygen sats drop[ below 88%, he is placing stress on his heart and other organs. We had a serious discussion about wearing his oxygen with all activity. He was in agreement with getting a saturation monitor to help monitor his oxygen levels.  We also discussed that he needs to use his Breztri  every day, without fail whether he feels good or bad. He wants an Inogen. I told him  he would not qualify as his oxygen need exceeded what an inogen can provide. We walked him today on the inogen, and he failed. He understands insurance will not pay for for the Inogen, as it does not maintain his sats greater than 88% at its highest flow.   We did review the results of his CT Chest. We will repeat the CT Chest in 3 months to maintain surveillance. I will have patient follow up in 1 month to ensure he is doing well. We will discuss Pulmonary Rehab at that time.   We discussed smoking cessation  for 3 minutes. He has been provided resources to help him quit. We discussed he cannot smoke while wearing his oxygen. He verbalized understanding.       Test Results: 12/16/2023 CXR Persistent hazy airspace opacification over the left mid to lower lung with slight interval improved aeration.  12/08/2023 CT Chest Nodular opacity in the left costophrenic sulcus has improved, likely representing a nearly resolved infectious or inflammatory process. 2. Stable atypical cystic areas in the left upper lobe and superior segment of the right lower lobe. The lesions merit  continued follow up. 3. Stable irregular reticulation in the right lower lobe. 4. Mild central lobular and paraseptal emphysema.  Inpatient Ambulatory saturations Will need home oxygen as below. Patient Saturations on Room Air at Rest = 94% Patient Saturations on [employer] while Ambulating = 79% Patient Saturations on 6 Liters of oxygen while Ambulating = 90% Patient desaturates quickly with any activity.     Latest Ref Rng & Units 12/18/2023    3:02 AM 12/17/2023    3:01 AM 12/16/2023    3:04 AM  CBC  WBC 4.0 - 10.5 K/uL 8.5  8.1  9.7   Hemoglobin 13.0 - 17.0 g/dL 86.9  86.4  86.4   Hematocrit 39.0 - 52.0 % 39.7  41.6  43.2   Platelets 150 - 400 K/uL 190  180  147        Latest Ref Rng & Units 12/18/2023    3:02 AM 12/17/2023    3:01 AM 12/16/2023    3:04 AM  BMP  Glucose 70 - 99 mg/dL 874  875  99   BUN 8 - 23 mg/dL 15  19  18    Creatinine 0.61 - 1.24 mg/dL 9.21  9.15  9.02   Sodium 135 - 145 mmol/L 139  138  135   Potassium 3.5 - 5.1 mmol/L 3.5  4.0  4.4   Chloride 98 - 111 mmol/L 102  101  99   CO2 22 - 32 mmol/L 30  31  31    Calcium  8.9 - 10.3 mg/dL 8.5  8.6  8.7     BNP    Component Value Date/Time   BNP 34.0 12/12/2022 2203    ProBNP    Component Value Date/Time   PROBNP 138.0 12/14/2023 0705    PFT No results found for: FEV1PRE, FEV1POST, FVCPRE, FVCPOST, TLC, DLCOUNC, PREFEV1FVCRT, PSTFEV1FVCRT  ECHOCARDIOGRAM COMPLETE Result Date: 12/17/2023    ECHOCARDIOGRAM REPORT   Patient Name:   [name] Date of Exam: 12/17/2023 Medical Rec #:  [id number]           Height:       69.0 in Accession #:    [id number]          Weight:       221.1 lb Date of Birth:  Sep 01, 1955          BSA:          2.156 m Patient Age:    68 years            BP:           119/57 mmHg Patient Gender: M                   HR:           93 bpm. Exam Location:  Inpatient Procedure: 2D Echo and Saline Contrast Bubble Study (Both Spectral and Color  Flow Doppler  were utilized during procedure). Indications:    Hypoxia  History:        Patient has prior history of Echocardiogram examinations. COPD.  Sonographer:    Charmaine Gaskins Referring Phys: [id number] RAHUL P DESAI  Sonographer Comments: Technically difficult study due to poor echo windows. Image acquisition challenging due to COPD. IMPRESSIONS  1. Left ventricular ejection fraction, by estimation, is 65 to 70%. Left ventricular ejection fraction by 2D MOD biplane is 65.4 %. The left ventricle has normal function. The left ventricle has no regional wall motion abnormalities. Left ventricular diastolic parameters were normal.  2. Right ventricular systolic function is normal. The right ventricular size is normal.  3. The mitral valve is normal in structure. No evidence of mitral valve regurgitation. No evidence of mitral stenosis.  4. The aortic valve is normal in structure. Aortic valve regurgitation is not visualized. Aortic valve sclerosis/calcification is present, without any evidence of aortic stenosis.  5. The inferior vena cava is dilated in size with >50% respiratory variability, suggesting right atrial pressure of 8 mmHg. Comparison(s): No significant change from prior study. FINDINGS  Left Ventricle: Left ventricular ejection fraction, by estimation, is 65 to 70%. Left ventricular ejection fraction by 2D MOD biplane is 65.4 %. The left ventricle has normal function. The left ventricle has no regional wall motion abnormalities. The left ventricular internal cavity size was normal in size. There is no left ventricular hypertrophy. Left ventricular diastolic parameters were normal. Right Ventricle: The right ventricular size is normal. No increase in right ventricular wall thickness. Right ventricular systolic function is normal. Left Atrium: Left atrial size was normal in size. Right Atrium: Right atrial size was normal in size. Pericardium: There is no evidence of pericardial effusion. Mitral Valve: The mitral  valve is normal in structure. No evidence of mitral valve regurgitation. No evidence of mitral valve stenosis. Tricuspid Valve: The tricuspid valve is normal in structure. Tricuspid valve regurgitation is not demonstrated. No evidence of tricuspid stenosis. Aortic Valve: The aortic valve is normal in structure. Aortic valve regurgitation is not visualized. Aortic valve sclerosis/calcification is present, without any evidence of aortic stenosis. Aortic valve mean gradient measures 10.0 mmHg. Aortic valve peak gradient measures 16.8 mmHg. Aortic valve area, by VTI measures 2.23 cm. Pulmonic Valve: The pulmonic valve was not well visualized. Pulmonic valve regurgitation is not visualized. No evidence of pulmonic stenosis. Aorta: The aortic root is normal in size and structure. Venous: The inferior vena cava is dilated in size with greater than 50% respiratory variability, suggesting right atrial pressure of 8 mmHg. IAS/Shunts: No atrial level shunt detected by color flow Doppler. Agitated saline contrast was given intravenously to evaluate for intracardiac shunting.  LEFT VENTRICLE PLAX 2D                        Biplane EF (MOD) LVIDd:         4.90 cm         LV Biplane EF:   Left LVIDs:         3.40 cm                          ventricular LV PW:         1.10 cm                          ejection LV IVS:  1.20 cm                          fraction by LVOT diam:     2.00 cm                          2D MOD LV SV:         79                               biplane is LV SV Index:   37                               65.4 %. LVOT Area:     3.14 cm                                Diastology                                LV e' medial:    6.53 cm/s LV Volumes (MOD)               LV E/e' medial:  11.8 LV vol d, MOD    88.7 ml       LV e' lateral:   10.90 cm/s A2C:                           LV E/e' lateral: 7.1 LV vol d, MOD    80.0 ml A4C: LV vol s, MOD    29.6 ml A2C: LV vol s, MOD    29.4 ml A4C: LV SV MOD A2C:   59.1 ml  LV SV MOD A4C:   80.0 ml LV SV MOD BP:    55.7 ml RIGHT VENTRICLE RV Basal diam:  3.00 cm RV Mid diam:    3.10 cm RV S prime:     15.30 cm/s LEFT ATRIUM             Index        RIGHT ATRIUM           Index LA diam:        3.20 cm 1.48 cm/m   RA Area:     18.20 cm LA Vol (A2C):   65.3 ml 30.29 ml/m  RA Volume:   50.90 ml  23.61 ml/m LA Vol (A4C):   58.3 ml 27.04 ml/m LA Biplane Vol: 62.5 ml 28.99 ml/m  AORTIC VALVE AV Area (Vmax):    1.99 cm AV Area (Vmean):   1.87 cm AV Area (VTI):     2.23 cm AV Vmax:           205.00 cm/s AV Vmean:          155.000 cm/s AV VTI:            0.357 m AV Peak Grad:      16.8 mmHg AV Mean Grad:      10.0 mmHg LVOT Vmax:         130.00 cm/s LVOT Vmean:        92.500 cm/s LVOT VTI:          0.253 m LVOT/AV VTI  ratio: 0.71  AORTA Ao Root diam: 3.10 cm Ao Asc diam:  3.00 cm MITRAL VALVE MV Area (PHT): 3.49 cm    SHUNTS MV E velocity: 77.10 cm/s  Systemic VTI:  0.25 m MV A velocity: 96.80 cm/s  Systemic Diam: 2.00 cm MV E/A ratio:  0.80 Franck Azobou Tonleu Electronically signed by Joelle Cedars Tonleu Signature Date/Time: 12/17/2023/3:15:21 PM    Final    DG CHEST PORT 1 VIEW Result Date: 12/16/2023 CLINICAL DATA:  Shortness of breath. EXAM: PORTABLE CHEST 1 VIEW COMPARISON:  12/14/2023 FINDINGS: Lungs are adequately inflated and demonstrate persistent hazy airspace opacification over the left mid to lower lung with slight interval improved aeration. Right lung is clear. No effusion. Mediastinal silhouette and remainder of the exam is unchanged. IMPRESSION: Persistent hazy airspace opacification over the left mid to lower lung with slight interval improved aeration. Electronically Signed   By: Toribio Agreste M.D.   On: 12/16/2023 09:08   CT CHEST WO CONTRAST Result Date: 12/14/2023 EXAM: CT CHEST WITHOUT CONTRAST 12/08/2023 01:35:00 PM TECHNIQUE: CT of the chest was performed without the administration of intravenous contrast. Multiplanar reformatted images are provided for  review. Automated exposure control, iterative reconstruction, and/or weight based adjustment of the mA/kV was utilized to reduce the radiation dose to as low as reasonably achievable. COMPARISON: Head CT fusion study dated 09/03/2023 and CT of the chest dated 08/07/2023. CLINICAL HISTORY: Lung nodules, multiple. FINDINGS: MEDIASTINUM: Heart and pericardium are unremarkable. There is mild calcific coronary artery disease present. The thoracic aorta also demonstrates mild calcific atheromatous disease. The central airways are clear. LYMPH NODES: No mediastinal, hilar or axillary lymphadenopathy. LUNGS AND PLEURA: The nodular opacity previously noted posterolaterally within the left costophrenic sulcus has continued to improve and no longer has a nodular appearance, likely reflecting a nearly completely resolved infectious or inflammatory process. The atypical cystic areas within the left upper lobe and within the superior segment of the right lower lobe have not changed significantly in size or appearance in the interim. An area of irregular reticulation present medially within the right lower lobe on image 90 of series 7, also appears unchanged. There is mild central lobular and paraseptal emphysema. No pleural effusion or pneumothorax. SOFT TISSUES/BONES: No acute abnormality of the bones or soft tissues. UPPER ABDOMEN: Limited images of the upper abdomen demonstrates no acute abnormality. IMPRESSION: 1. Nodular opacity in the left costophrenic sulcus has improved, likely representing a nearly resolved infectious or inflammatory process. 2. Stable atypical cystic areas in the left upper lobe and superior segment of the right lower lobe. The lesions merit continued follow up. 3. Stable irregular reticulation in the right lower lobe. 4. Mild central lobular and paraseptal emphysema. Electronically signed by: Evalene Coho MD 12/14/2023 07:50 AM EST RP Workstation: [id number]   DG Chest Portable 1 View Result  Date: 12/14/2023 EXAM: 1 VIEW XRAY OF THE CHEST 12/14/2023 07:25:00 AM COMPARISON: 12/12/2022 CLINICAL HISTORY: SOB FINDINGS: LUNGS AND PLEURA: Left basilar patchy opacities. No pleural effusion. No pneumothorax. HEART AND MEDIASTINUM: No acute abnormality of the cardiac and mediastinal silhouettes. BONES AND SOFT TISSUES: No acute osseous abnormality. IMPRESSION: 1. Left basilar hazy and patchy areas of opacification/consolidation . Electronically signed by: Evalene Coho MD 12/14/2023 07:35 AM EST RP Workstation: [id number]     Past medical hx Past Medical History:  Diagnosis Date   Asthma    COPD (chronic obstructive pulmonary disease) (HCC)    Hx of adenomatous polyp of colon 01/13/2020   Hyperlipidemia 05/22/2022  Hypertension    Obesity 02/04/2012     Social History   Tobacco Use   Smoking status: Every Day    Current packs/day: 0.30    Average packs/day: 1 pack/day for 46.0 years (45.3 ttl pk-yrs)    Types: Cigarettes    Start date: 12/2022   Smokeless tobacco: Never   Tobacco comments:    Has not smoked since been in hospital 12/23/2023  Vaping Use   Vaping status: Never Used  Substance Use Topics   Alcohol use: Yes    Alcohol/week: 1.0 standard drink of alcohol    Types: 1 Standard drinks or equivalent per week    Comment: every once in awhile-3 times a month per pt   Drug use: Yes    Frequency: 1.0 times per week    Types: Marijuana    Comment: occasionally marijuana-3 times a month per pt    Mr.Doody reports that he has been smoking cigarettes. He started smoking about a year ago. He has a 45.3 pack-year smoking history. He has never used smokeless tobacco. He reports current alcohol use of about 1.0 standard drink of alcohol per week. He reports current drug use. Frequency: 1.00 time per week. Drug: Marijuana.  Tobacco Cessation: Ready to quit: Not Answered Counseling given: Not Answered Tobacco comments: Has not smoked since been in hospital  12/23/2023 Current every day smoker   Past surgical hx, Family hx, Social hx all reviewed.  Current Outpatient Medications on File Prior to Visit  Medication Sig   albuterol  (VENTOLIN  HFA) 108 (90 Base) MCG/ACT inhaler Inhale 2 puffs into the lungs every 6 (six) hours as needed for wheezing or shortness of breath.   atorvastatin  (LIPITOR) 40 MG tablet Take 1 tablet (40 mg total) by mouth daily for high cholesterol and heart health .   budesonide -glycopyrrolate -formoterol  (BREZTRI  AEROSPHERE) 160-9-4.8 MCG/ACT AERO inhaler Inhale 2 puffs into the lungs in the morning and at bedtime.   fluticasone  (FLONASE ) 50 MCG/ACT nasal spray Place 2 sprays into both nostrils daily for seasonal allergies and flare up   furosemide  (LASIX ) 20 MG tablet Take 1 tablet (20 mg total) by mouth in the morning high blood pressure and swelling .   ipratropium-albuterol  (DUONEB) 0.5-2.5 (3) MG/3ML SOLN Take 3 mLs by nebulization in the morning, at noon, in the evening, and at bedtime as needed for moderate to severe shortness of breath, COPD flare up or wheezing   losartan  (COZAAR ) 25 MG tablet Take 1 tablet (25 mg total) by mouth in the morning for high blood pressure (Patient taking differently: Take 25 mg by mouth daily as needed (For blood pressure).)   montelukast  (SINGULAIR ) 10 MG tablet Take 1 tablet (10 mg total) by mouth every evening for flare up and seasonal allergies. (Patient taking differently: Take 10 mg by mouth daily as needed (For allergies).)   No current facility-administered medications on file prior to visit.     Allergies  Allergen Reactions   Codeine Itching   Penicillins Itching and Other (See Comments)    Review Of Systems:  Constitutional:   No  weight loss, night sweats,  Fevers, chills, fatigue, or  lassitude.  HEENT:   No headaches,  Difficulty swallowing,  Tooth/dental problems, or  Sore throat,                No sneezing, itching, ear ache, nasal congestion, post nasal drip,    CV:  No chest pain,  Orthopnea, PND, swelling in lower extremities, anasarca, dizziness, palpitations, syncope.  GI  No heartburn, indigestion, abdominal pain, nausea, vomiting, diarrhea, change in bowel habits, loss of appetite, bloody stools.   Resp: + shortness of breath with exertion less at rest.  + baseline  excess mucus, + productive cough,  + non-productive cough,  No coughing up of blood.  No change in color of mucus.  No wheezing.  No chest wall deformity  Skin: no rash or lesions.  GU: no dysuria, change in color of urine, no urgency or frequency.  No flank pain, no hematuria   MS:  No joint pain or swelling.  No decreased range of motion.  No back pain.  Psych:  No change in mood or affect. No depression or anxiety.  No memory loss.   Vital Signs BP (!) 144/76   Pulse (!) 105   Temp 98.4 F (36.9 C) (Oral)   Ht 5' 9 (1.753 m)   Wt 229 lb 3.2 oz (104 kg)   SpO2 (!) 88%   BMI 33.85 kg/m    Physical Exam:  General- No distress,  A&Ox3,appropriate ENT: No sinus tenderness, TM clear, pale nasal mucosa, no oral exudate,no post nasal drip, no LAN Cardiac: S1, S2, regular rate and rhythm, no murmur Chest: No wheeze/ rales/ dullness; no accessory muscle use, no nasal flaring, no sternal retractions, diminished per bases Abd.: Soft Non-tender, ND, BS +, Body mass index is 33.85 kg/m.  Ext: No clubbing cyanosis, edema, no obvious deformities Neuro:  normal strength, MAE x 4, A&O x 3  Skin: No rashes, warm and dry, no obvious skin lesions  Psych: normal mood and behavior  Physical Exam    Assessment/Plan Recent AECOPD requiring hospitalization Now on home oxygen at 6 L>> non compliant ( No oxygen at todays appointment, sats 88%) Pulmonary Nodules Heart Failure>> Lasix  20 mg daily Plan Follow up in 1 month to make sure you are doing well.  We will consider doing Pulmonary Function Tests at that time.  Please wear oxygen at 6 L [place] with any activity. Saturations  need to be 88% or higher at all times. Do not smoke while wearing oxygen.  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  We will do a 3 month follow up scan , due 2/25 or after to monitor these. Continue Breztri  2 puffs in the morning and 2 puffs in the evening. Rinse mouth after use.  Continue DuoNebs 4 times as day as needed for breakthrough shortness of breath or wheezing.  Please work on quitting smoking. You can receive free nicotine  replacement therapy (patches, gum, or mints) by calling 1-800-QUIT NOW. Please call so we can get you on the path to becoming a non-smoker. I know it is hard, but you can do this!  Hypnosis for smoking cessation  [employer] [phone]  Acupuncture for smoking cessation  [facility] [phone]   We will walk you today on the inogen to see if you qualify.  Call us  if you need us  sooner.  Note your daily symptoms > remember red flags for COPD:  Increase in cough, increase in sputum production, increase in shortness of breath or activity intolerance. If you notice these symptoms, please call to be seen.   Follow up with [facility] tomorrow as is scheduled.    I spent 35 minutes dedicated to the care of this patient on the date of this encounter to include pre-visit review  of records, face-to-face time with the patient discussing conditions above, post visit ordering of testing, clinical documentation with the electronic health record, making appropriate referrals as documented, and communicating necessary information to the patient's healthcare team.   Assessment & Plan        Lauraine JULIANNA Lites, NP 12/23/2023  12:39 PM

## 2023-12-24 ENCOUNTER — Other Ambulatory Visit: Payer: Self-pay

## 2023-12-24 MED ORDER — ALBUTEROL SULFATE HFA 108 (90 BASE) MCG/ACT IN AERS: 1.0000 | INHALATION_SPRAY | RESPIRATORY_TRACT | 3 refills | Status: DC | PRN

## 2024-01-19 ENCOUNTER — Emergency Department (HOSPITAL_COMMUNITY)

## 2024-01-19 ENCOUNTER — Encounter (HOSPITAL_COMMUNITY): Payer: Self-pay | Admitting: Internal Medicine

## 2024-01-19 ENCOUNTER — Inpatient Hospital Stay (HOSPITAL_COMMUNITY)
Admission: EM | Admit: 2024-01-19 | Discharge: 2024-01-23 | DRG: 871 | Disposition: A | Discharge status: Home / Self Care | Attending: Internal Medicine | Admitting: Internal Medicine

## 2024-01-19 ENCOUNTER — Other Ambulatory Visit: Payer: Self-pay

## 2024-01-19 DIAGNOSIS — Z6832 Body mass index (BMI) 32.0-32.9, adult: Secondary | ICD-10-CM | POA: Diagnosis not present

## 2024-01-19 DIAGNOSIS — Z7951 Long term (current) use of inhaled steroids: Secondary | ICD-10-CM | POA: Diagnosis not present

## 2024-01-19 DIAGNOSIS — D61818 Other pancytopenia: Secondary | ICD-10-CM | POA: Diagnosis present

## 2024-01-19 DIAGNOSIS — Z880 Allergy status to penicillin: Secondary | ICD-10-CM | POA: Diagnosis not present

## 2024-01-19 DIAGNOSIS — Z885 Allergy status to narcotic agent status: Secondary | ICD-10-CM

## 2024-01-19 DIAGNOSIS — I10 Essential (primary) hypertension: Secondary | ICD-10-CM | POA: Diagnosis present

## 2024-01-19 DIAGNOSIS — J440 Chronic obstructive pulmonary disease with acute lower respiratory infection: Secondary | ICD-10-CM | POA: Diagnosis present

## 2024-01-19 DIAGNOSIS — J449 Chronic obstructive pulmonary disease, unspecified: Secondary | ICD-10-CM

## 2024-01-19 DIAGNOSIS — A419 Sepsis, unspecified organism: Principal | ICD-10-CM | POA: Diagnosis present

## 2024-01-19 DIAGNOSIS — E66811 Obesity, class 1: Secondary | ICD-10-CM | POA: Diagnosis present

## 2024-01-19 DIAGNOSIS — Z1152 Encounter for screening for COVID-19: Secondary | ICD-10-CM

## 2024-01-19 DIAGNOSIS — J441 Chronic obstructive pulmonary disease with (acute) exacerbation: Secondary | ICD-10-CM | POA: Diagnosis present

## 2024-01-19 DIAGNOSIS — Z809 Family history of malignant neoplasm, unspecified: Secondary | ICD-10-CM | POA: Diagnosis not present

## 2024-01-19 DIAGNOSIS — Z800 Family history of malignant neoplasm of digestive organs: Secondary | ICD-10-CM

## 2024-01-19 DIAGNOSIS — Z8249 Family history of ischemic heart disease and other diseases of the circulatory system: Secondary | ICD-10-CM

## 2024-01-19 DIAGNOSIS — J9611 Chronic respiratory failure with hypoxia: Secondary | ICD-10-CM | POA: Diagnosis present

## 2024-01-19 DIAGNOSIS — Z9981 Dependence on supplemental oxygen: Secondary | ICD-10-CM

## 2024-01-19 DIAGNOSIS — J159 Unspecified bacterial pneumonia: Secondary | ICD-10-CM | POA: Diagnosis present

## 2024-01-19 DIAGNOSIS — Z79899 Other long term (current) drug therapy: Secondary | ICD-10-CM | POA: Diagnosis not present

## 2024-01-19 DIAGNOSIS — J189 Pneumonia, unspecified organism: Secondary | ICD-10-CM | POA: Diagnosis present

## 2024-01-19 DIAGNOSIS — F17210 Nicotine dependence, cigarettes, uncomplicated: Secondary | ICD-10-CM | POA: Diagnosis present

## 2024-01-19 DIAGNOSIS — E7800 Pure hypercholesterolemia, unspecified: Secondary | ICD-10-CM | POA: Diagnosis present

## 2024-01-19 DIAGNOSIS — Z860101 Personal history of adenomatous and serrated colon polyps: Secondary | ICD-10-CM

## 2024-01-19 DIAGNOSIS — Z720 Tobacco use: Secondary | ICD-10-CM | POA: Diagnosis present

## 2024-01-19 LAB — BLOOD GAS, VENOUS
Acid-Base Excess: 10.3 mmol/L — ABNORMAL HIGH (ref 0.0–2.0)
Bicarbonate: 39.4 mmol/L — ABNORMAL HIGH (ref 20.0–28.0)
O2 Saturation: 96.5 %
Patient temperature: 37
pCO2, Ven: 73 mmHg (ref 44–60)
pH, Ven: 7.34 (ref 7.25–7.43)
pO2, Ven: 76 mmHg — ABNORMAL HIGH (ref 32–45)

## 2024-01-19 LAB — COMPREHENSIVE METABOLIC PANEL WITH GFR
ALT: 17 U/L (ref 0–44)
AST: 31 U/L (ref 15–41)
Albumin: 4 g/dL (ref 3.5–5.0)
Alkaline Phosphatase: 104 U/L (ref 38–126)
Anion gap: 6 (ref 5–15)
BUN: 11 mg/dL (ref 8–23)
CO2: 35 mmol/L — ABNORMAL HIGH (ref 22–32)
Calcium: 9 mg/dL (ref 8.9–10.3)
Chloride: 99 mmol/L (ref 98–111)
Creatinine, Ser: 0.92 mg/dL (ref 0.61–1.24)
GFR, Estimated: >60 mL/min
Glucose, Bld: 124 mg/dL — ABNORMAL HIGH (ref 70–99)
Potassium: 4.3 mmol/L (ref 3.5–5.1)
Sodium: 140 mmol/L (ref 135–145)
Total Bilirubin: 0.4 mg/dL (ref 0.0–1.2)
Total Protein: 6.8 g/dL (ref 6.5–8.1)

## 2024-01-19 LAB — CBC WITH DIFFERENTIAL/PLATELET
Abs Immature Granulocytes: 0.01 K/uL (ref 0.00–0.07)
Basophils Absolute: 0 K/uL (ref 0.0–0.1)
Basophils Relative: 1 %
Eosinophils Absolute: 0.2 K/uL (ref 0.0–0.5)
Eosinophils Relative: 3 %
HCT: 44.9 % (ref 39.0–52.0)
Hemoglobin: 14.6 g/dL (ref 13.0–17.0)
Immature Granulocytes: 0 %
Lymphocytes Relative: 39 %
Lymphs Abs: 2 K/uL (ref 0.7–4.0)
MCH: 32 pg (ref 26.0–34.0)
MCHC: 32.5 g/dL (ref 30.0–36.0)
MCV: 98.5 fL (ref 80.0–100.0)
Monocytes Absolute: 0.9 K/uL (ref 0.1–1.0)
Monocytes Relative: 17 %
Neutro Abs: 2.1 K/uL (ref 1.7–7.7)
Neutrophils Relative %: 40 %
Platelets: 127 K/uL — ABNORMAL LOW (ref 150–400)
RBC: 4.56 MIL/uL (ref 4.22–5.81)
RDW: 14 % (ref 11.5–15.5)
Smear Review: NORMAL
WBC: 5.1 K/uL (ref 4.0–10.5)
nRBC: 0 % (ref 0.0–0.2)

## 2024-01-19 LAB — PROTIME-INR
INR: 1 (ref 0.8–1.2)
Prothrombin Time: 13.6 s (ref 11.4–15.2)

## 2024-01-19 LAB — I-STAT CG4 LACTIC ACID, ED
Lactic Acid, Venous: 1.3 mmol/L (ref 0.5–1.9)
Lactic Acid, Venous: 0.6 mmol/L (ref 0.5–1.9)

## 2024-01-19 LAB — RESP PANEL BY RT-PCR (RSV, FLU A&B, COVID)  RVPGX2
Influenza A by PCR: NEGATIVE
Influenza B by PCR: NEGATIVE
Resp Syncytial Virus by PCR: NEGATIVE
SARS Coronavirus 2 by RT PCR: NEGATIVE

## 2024-01-19 MED ORDER — MONTELUKAST SODIUM 10 MG PO TABS
10.0000 mg | ORAL_TABLET | Freq: Every evening | ORAL | Status: DC
  Administered 2024-01-21 – 2024-01-22 (×2): 10 mg via ORAL
  Filled 2024-01-19 (×3): qty 1

## 2024-01-19 MED ORDER — ATORVASTATIN CALCIUM 40 MG PO TABS
40.0000 mg | ORAL_TABLET | Freq: Every day | ORAL | Status: DC
  Administered 2024-01-19 – 2024-01-23 (×5): 40 mg via ORAL
  Filled 2024-01-19 (×5): qty 1

## 2024-01-19 MED ORDER — ONDANSETRON HCL 4 MG PO TABS: 4.0000 mg | ORAL_TABLET | Freq: Four times a day (QID) | ORAL | Status: DC | PRN

## 2024-01-19 MED ORDER — LEVOFLOXACIN IN D5W 750 MG/150ML IV SOLN
750.0000 mg | Freq: Once | INTRAVENOUS | Status: AC
  Administered 2024-01-19: 750 mg via INTRAVENOUS
  Filled 2024-01-19: qty 150

## 2024-01-19 MED ORDER — TRAZODONE HCL 50 MG PO TABS: 25.0000 mg | ORAL_TABLET | Freq: Every evening | ORAL | Status: DC | PRN

## 2024-01-19 MED ORDER — GUAIFENESIN-DM 100-10 MG/5ML PO SYRP
5.0000 mL | ORAL_SOLUTION | ORAL | Status: DC | PRN
  Administered 2024-01-19 – 2024-01-22 (×6): 5 mL via ORAL
  Filled 2024-01-19 (×6): qty 10

## 2024-01-19 MED ORDER — ONDANSETRON HCL 4 MG/2ML IJ SOLN: 4.0000 mg | Freq: Four times a day (QID) | INTRAMUSCULAR | Status: DC | PRN

## 2024-01-19 MED ORDER — FLUTICASONE PROPIONATE 50 MCG/ACT NA SUSP
2.0000 | Freq: Every day | NASAL | Status: DC | PRN
  Administered 2024-01-20 – 2024-01-23 (×3): 2 via NASAL
  Filled 2024-01-19: qty 16

## 2024-01-19 MED ORDER — IPRATROPIUM-ALBUTEROL 0.5-2.5 (3) MG/3ML IN SOLN
3.0000 mL | Freq: Once | RESPIRATORY_TRACT | Status: AC
  Administered 2024-01-19: 3 mL via RESPIRATORY_TRACT
  Filled 2024-01-19: qty 3

## 2024-01-19 MED ORDER — IPRATROPIUM-ALBUTEROL 0.5-2.5 (3) MG/3ML IN SOLN
3.0000 mL | Freq: Four times a day (QID) | RESPIRATORY_TRACT | Status: DC
  Administered 2024-01-19 – 2024-01-23 (×14): 3 mL via RESPIRATORY_TRACT
  Filled 2024-01-19 (×16): qty 3

## 2024-01-19 MED ORDER — ACETAMINOPHEN 650 MG RE SUPP: 650.0000 mg | Freq: Four times a day (QID) | RECTAL | Status: DC | PRN

## 2024-01-19 MED ORDER — LACTATED RINGERS IV SOLN: INTRAVENOUS | Status: AC

## 2024-01-19 MED ORDER — LEVOFLOXACIN IN D5W 750 MG/150ML IV SOLN
750.0000 mg | INTRAVENOUS | Status: AC
  Administered 2024-01-20 – 2024-01-23 (×4): 750 mg via INTRAVENOUS
  Filled 2024-01-19 (×4): qty 150

## 2024-01-19 MED ORDER — ACETAMINOPHEN 325 MG PO TABS: 650.0000 mg | ORAL_TABLET | Freq: Four times a day (QID) | ORAL | Status: DC | PRN

## 2024-01-19 MED ORDER — ENOXAPARIN SODIUM 40 MG/0.4ML IJ SOSY
40.0000 mg | PREFILLED_SYRINGE | INTRAMUSCULAR | Status: DC
  Administered 2024-01-19 – 2024-01-22 (×4): 40 mg via SUBCUTANEOUS
  Filled 2024-01-19 (×4): qty 0.4

## 2024-01-19 MED ORDER — METHYLPREDNISOLONE SODIUM SUCC 40 MG IJ SOLR
40.0000 mg | Freq: Every day | INTRAMUSCULAR | Status: DC
  Administered 2024-01-20: 40 mg via INTRAVENOUS
  Filled 2024-01-19 (×2): qty 1

## 2024-01-19 MED ORDER — ALBUTEROL SULFATE (2.5 MG/3ML) 0.083% IN NEBU
2.5000 mg | INHALATION_SOLUTION | RESPIRATORY_TRACT | Status: DC | PRN
  Administered 2024-01-21 – 2024-01-22 (×3): 2.5 mg via RESPIRATORY_TRACT
  Filled 2024-01-19 (×2): qty 3

## 2024-01-19 NOTE — H&P (Signed)
 " History and Physical  [name] FMW:[id number] DOB: 03/12/1955 DOA: 01/19/2024  PCP: Patient, No Pcp Per   Chief Complaint: Cough, shortness of breath  HPI: [name] is a 69 y.o. male with medical history significant for asthma, COPD on chronic 6 L nasal cannula oxygen, hyperlipidemia, hypertension, tobacco abuse being admitted to the hospital with several days of progressive cough, shortness of breath found to have recurrent acute exacerbation of COPD and possible community-acquired pneumonia.  Patient states that he had gotten better since his last hospitalization, has been working on getting smoking and has actually not had a cigarette in the last 5 days.  He has had continued cough, no fever, no chest pain, cough is productive of clear sputum which is essentially his baseline.  However he has had progressive dyspnea and shortness of breath, he has had to increase his supplemental oxygen from 6 L/min to 10 L/min at home, despite which she continued to be dyspneic with any exertion.  He has also been feeling generally weak, with some subjective chills.  Notably, his girlfriend was diagnosed with influenza A a couple of weeks ago when he was exposed to her.  He called EMS this morning for worsening dyspnea, was given albuterol , ipratropium as well as 125 mg IV Solu-Medrol  enroute.  Review of Systems: Please see HPI for pertinent positives and negatives. A complete 10 system review of systems are otherwise negative.  Past Medical History:  Diagnosis Date   Asthma    COPD (chronic obstructive pulmonary disease) (HCC)    Hx of adenomatous polyp of colon 01/13/2020   Hyperlipidemia 05/22/2022   Hypertension    Obesity 02/04/2012   Past Surgical History:  Procedure Laterality Date   HAND SURGERY Bilateral left 8021,8008 Rt   Social History:  reports that he has been smoking cigarettes. He started smoking about 13 months ago. He has a 45.3 pack-year smoking history. He has  never used smokeless tobacco. He reports current alcohol use of about 1.0 standard drink of alcohol per week. He reports current drug use. Frequency: 1.00 time per week. Drug: Marijuana.  Allergies[1]  Family History  Problem Relation Age of Onset   Hypertension Paternal Uncle    Cancer Maternal Grandmother    Hypertension Maternal Grandfather    Colon cancer Cousin    Colon polyps Neg Hx    Esophageal cancer Neg Hx    Stomach cancer Neg Hx    Rectal cancer Neg Hx      Prior to Admission medications  Medication Sig Start Date End Date Taking? Authorizing Provider  albuterol  (VENTOLIN  HFA) 108 (90 Base) MCG/ACT inhaler Inhale 2 puffs into the lungs every 6 (six) hours as needed for wheezing or shortness of breath. 12/19/23   Jadine Toribio SQUIBB, MD  albuterol  (VENTOLIN  HFA) 108 [phone] Base) MCG/ACT inhaler inhale 1-2 puff (90 mcg) by inhalation route every 4 hours (up to 6 times daily) as needed for shortness of breath or wheezing. 12/24/23     atorvastatin  (LIPITOR) 40 MG tablet Take 1 tablet (40 mg total) by mouth daily for high cholesterol and heart health . 10/10/23   Arloa Jarvis, NP  budesonide -glycopyrrolate -formoterol  (BREZTRI  AEROSPHERE) 160-9-4.8 MCG/ACT AERO inhaler Inhale 2 puffs into the lungs in the morning and at bedtime. 08/25/23   Ruthell Lauraine FALCON, NP  fluticasone  (FLONASE ) 50 MCG/ACT nasal spray Place 2 sprays into both nostrils daily for seasonal allergies and flare up 10/10/23     furosemide  (LASIX ) 20 MG tablet Take  1 tablet (20 mg total) by mouth in the morning high blood pressure and swelling . 10/10/23     ipratropium-albuterol  (DUONEB) 0.5-2.5 (3) MG/3ML SOLN Take 3 mLs by nebulization in the morning, at noon, in the evening, and at bedtime as needed for moderate to severe shortness of breath, COPD flare up or wheezing 10/10/23     losartan  (COZAAR ) 25 MG tablet Take 1 tablet (25 mg total) by mouth in the morning for high blood pressure Patient taking differently: Take 25 mg  by mouth daily as needed (For blood pressure). 10/10/23     montelukast  (SINGULAIR ) 10 MG tablet Take 1 tablet (10 mg total) by mouth every evening for flare up and seasonal allergies. Patient taking differently: Take 10 mg by mouth daily as needed (For allergies). 10/10/23       Physical Exam: BP 139/83   Pulse (!) 111   Temp 99.5 F (37.5 C)   Resp 19   SpO2 96%  General:  Alert, oriented, calm, in no acute distress currently wearing 6 L nasal cannula, no cough, speaking in full sentences. Cardiovascular: RRR, no murmurs or rubs, no peripheral edema  Respiratory: Breath sounds are diminished globally, he has good equal bilateral chest rise, no tachypnea or evidence of respiratory distress, there is diffuse rhonchi especially on the left side Abdomen: soft, nontender, nondistended, normal bowel tones heard  Skin: dry, no rashes  Musculoskeletal: no joint effusions, normal range of motion  Psychiatric: appropriate affect, normal speech  Neurologic: extraocular muscles intact, clear speech, moving all extremities with intact sensorium         Labs on Admission:  Basic Metabolic Panel: Recent Labs  Lab 01/19/24 0657  NA 140  K 4.3  CL 99  CO2 35*  GLUCOSE 124*  BUN 11  CREATININE 0.92  CALCIUM  9.0   Liver Function Tests: Recent Labs  Lab 01/19/24 0657  AST 31  ALT 17  ALKPHOS 104  BILITOT 0.4  PROT 6.8  ALBUMIN 4.0   No results for input(s): LIPASE, AMYLASE in the last 168 hours. No results for input(s): AMMONIA in the last 168 hours. CBC: Recent Labs  Lab 01/19/24 0657  WBC 5.1  NEUTROABS 2.1  HGB 14.6  HCT 44.9  MCV 98.5  PLT 127*   Cardiac Enzymes: No results for input(s): CKTOTAL, CKMB, CKMBINDEX, TROPONINI in the last 168 hours. BNP (last 3 results) No results for input(s): BNP in the last 8760 hours.  ProBNP (last 3 results) Recent Labs    12/14/23 0705  PROBNP 138.0    CBG: No results for input(s): GLUCAP in the last 168  hours.  Radiological Exams on Admission: DG Chest Port 1 View Result Date: 01/19/2024 CLINICAL DATA:  Shortness of breath. EXAM: PORTABLE CHEST 1 VIEW COMPARISON:  12/16/2023. FINDINGS: Mild hyperexpansion. Airspace disease seen previously at the left base has decreased in the interval but is not completely resolved. Streaky density in the infrahilar right lung base may be atelectasis although infiltrate is not excluded in the appearance is more conspicuous than on the prior study. The cardio pericardial silhouette is enlarged. Telemetry leads overlie the chest. IMPRESSION: 1. Interval decrease in left basilar airspace disease. 2. New streaky density in the infrahilar right lung base may be atelectasis although infiltrate is not excluded. Electronically Signed   By: Camellia Candle M.D.   On: 01/19/2024 08:50   Assessment/Plan Lliam Tiran Sauseda is a 69 y.o. male with medical history significant for asthma, COPD on chronic 6  L nasal cannula oxygen, hyperlipidemia, hypertension, tobacco abuse being admitted to the hospital with several days of progressive cough, shortness of breath found to have recurrent acute exacerbation of COPD and sepsis due to possible community-acquired pneumonia.   Sepsis-meeting criteria with fever, tachycardia and source is suspected community-acquired pneumonia given new streaky density in the right infrahilar lung base.  No evidence of endorgan dysfunction. -Inpatient admission -Empiric IV Levaquin  -Gentle IV fluids, note normal lactate and hemodynamics -Follow-up blood cultures  Acute exacerbation of COPD-with increased cough, continued sputum production, wheezing/rhonchi.  Likely exacerbated by community-acquired pneumonia, and potentially viral syndrome. -Scheduled DuoNebs, as needed albuterol  nebulizer, continue home Singulair  nightly -Supplemental oxygen, wean O2 as tolerated, with goal O2 saturation greater than 88% -IV Solu-Medrol  -Incentive spirometer, flutter  valve -Follow-up respiratory panel  Acute on chronic hypoxic respiratory failure-due to sepsis, COPD, community-acquired pneumonia as above  Hyperlipidemia-Lipitor  DVT prophylaxis: Lovenox      Code Status: Full Code  Consults called: None  Admission status: The appropriate patient status for this patient is INPATIENT. Inpatient status is judged to be reasonable and necessary in order to provide the required intensity of service to ensure the patient's safety. The patient's presenting symptoms, physical exam findings, and initial radiographic and laboratory data in the context of their chronic comorbidities is felt to place them at high risk for further clinical deterioration. Furthermore, it is not anticipated that the patient will be medically stable for discharge from the hospital within 2 midnights of admission.    I certify that at the point of admission it is my clinical judgment that the patient will require inpatient hospital care spanning beyond 2 midnights from the point of admission due to high intensity of service, high risk for further deterioration and high frequency of surveillance required  Time spent: 53 minutes  Merritt Kibby CHRISTELLA Gail MD [facility] Pager [phone]  If 7PM-7AM, please contact night-coverage [email or web address] Password [email or web address]  01/19/2024, 9:57 AM      [1]  Allergies Allergen Reactions   Codeine Itching   Penicillins Itching and Other (See Comments)   "

## 2024-01-19 NOTE — ED Notes (Signed)
 Per private message w MD, pt can eat

## 2024-01-19 NOTE — ED Provider Notes (Addendum)
 " [place] [facility] DEPARTMENT AT [facility] Provider Note   CSN: [id number] Arrival date & time: 01/19/24  9376     Patient presents with: Shortness of Breath   [name] is a 69 y.o. male who presents to the ED via EMS for worsening dyspnea.  Has history of COPD, states that he has significant other who was ill with influenza who had come by his house approximately 2 weeks prior, within the last weeks had worsening dyspnea and general malaise.  He uses inhaled corticosteroid as well as albuterol  at home for his COPD management, though it is noted that he has had worsening dyspnea despite home treatment is required increased oxygen as he normally has 6 L/min by nasal cannula and has required up to 8 and still having significant dyspnea.  Per EMS was tripoding on their arrival, he received albuterol  treatment, ipratropium, as well as 125 Solu-Medrol .  Noted to have some improvement on arrival to the ED however still has subjective dyspnea, and is tachypneic on initial assessment.  Further medical history shows diagnoses of hypertension, type 2 diabetes, and hyperlipidemia.  He was discharged on 19 December 2023 for hypoxic respiratory failure, COPD exacerbation.    Shortness of Breath Associated symptoms: cough and sore throat        Prior to Admission medications  Medication Sig Start Date End Date Taking? Authorizing Provider  albuterol  (VENTOLIN  HFA) 108 (90 Base) MCG/ACT inhaler Inhale 2 puffs into the lungs every 6 (six) hours as needed for wheezing or shortness of breath. 12/19/23   Jadine Toribio SQUIBB, MD  albuterol  (VENTOLIN  HFA) 108 (90 Base) MCG/ACT inhaler inhale 1-2 puff (90 mcg) by inhalation route every 4 hours (up to 6 times daily) as needed for shortness of breath or wheezing. 12/24/23     atorvastatin  (LIPITOR) 40 MG tablet Take 1 tablet (40 mg total) by mouth daily for high cholesterol and heart health . 10/10/23   Arloa Jarvis, NP   budesonide -glycopyrrolate -formoterol  (BREZTRI  AEROSPHERE) 160-9-4.8 MCG/ACT AERO inhaler Inhale 2 puffs into the lungs in the morning and at bedtime. 08/25/23   Ruthell Lauraine FALCON, NP  fluticasone  (FLONASE ) 50 MCG/ACT nasal spray Place 2 sprays into both nostrils daily for seasonal allergies and flare up 10/10/23     furosemide  (LASIX ) 20 MG tablet Take 1 tablet (20 mg total) by mouth in the morning high blood pressure and swelling . 10/10/23     ipratropium-albuterol  (DUONEB) 0.5-2.5 (3) MG/3ML SOLN Take 3 mLs by nebulization in the morning, at noon, in the evening, and at bedtime as needed for moderate to severe shortness of breath, COPD flare up or wheezing 10/10/23     losartan  (COZAAR ) 25 MG tablet Take 1 tablet (25 mg total) by mouth in the morning for high blood pressure Patient taking differently: Take 25 mg by mouth daily as needed (For blood pressure). 10/10/23     montelukast  (SINGULAIR ) 10 MG tablet Take 1 tablet (10 mg total) by mouth every evening for flare up and seasonal allergies. Patient taking differently: Take 10 mg by mouth daily as needed (For allergies). 10/10/23       Allergies: Codeine and Penicillins    Review of Systems  HENT:  Positive for congestion, rhinorrhea and sore throat.   Respiratory:  Positive for cough and shortness of breath.   All other systems reviewed and are negative.   Updated Vital Signs BP 139/83   Pulse (!) 111   Temp 99.5 F (37.5 C)  Resp 19   SpO2 96%   Physical Exam Vitals and nursing note reviewed.  Constitutional:      General: He is not in acute distress.    Appearance: Normal appearance. He is well-developed.  HENT:     Head: Normocephalic and atraumatic.     Mouth/Throat:     Mouth: Mucous membranes are moist.     Pharynx: Oropharynx is clear.  Eyes:     Extraocular Movements: Extraocular movements intact.     Conjunctiva/sclera: Conjunctivae normal.     Pupils: Pupils are equal, round, and reactive to light.  Neck:      Vascular: No JVD.  Cardiovascular:     Rate and Rhythm: Normal rate and regular rhythm.     Pulses: Normal pulses.     Heart sounds: Normal heart sounds. No murmur heard.    No friction rub. No gallop.  Pulmonary:     Effort: Pulmonary effort is normal. Tachypnea present. No accessory muscle usage.     Breath sounds: Examination of the right-upper field reveals wheezing. Examination of the left-upper field reveals wheezing. Examination of the right-middle field reveals wheezing. Examination of the left-middle field reveals wheezing. Examination of the right-lower field reveals wheezing. Examination of the left-lower field reveals wheezing. Wheezing present.  Abdominal:     General: Abdomen is flat. Bowel sounds are normal.     Palpations: Abdomen is soft.  Musculoskeletal:        General: Normal range of motion.     Cervical back: Normal range of motion and neck supple.     Right lower leg: No edema.     Left lower leg: No edema.  Lymphadenopathy:     Cervical: No cervical adenopathy.  Skin:    General: Skin is warm and dry.     Capillary Refill: Capillary refill takes less than 2 seconds.  Neurological:     General: No focal deficit present.     Mental Status: He is alert and oriented to person, place, and time. Mental status is at baseline.  Psychiatric:        Mood and Affect: Mood normal.        Behavior: Behavior normal.     (all labs ordered are listed, but only abnormal results are displayed) Labs Reviewed  COMPREHENSIVE METABOLIC PANEL WITH GFR - Abnormal; Notable for the following components:      Result Value   CO2 35 (*)    Glucose, Bld 124 (*)    All other components within normal limits  CBC WITH DIFFERENTIAL/PLATELET - Abnormal; Notable for the following components:   Platelets 127 (*)    All other components within normal limits  BLOOD GAS, VENOUS - Abnormal; Notable for the following components:   pCO2, Ven 73 (*)    pO2, Ven 76 (*)    Bicarbonate 39.4 (*)     Acid-Base Excess 10.3 (*)    All other components within normal limits  CULTURE, BLOOD (ROUTINE X 2)  CULTURE, BLOOD (ROUTINE X 2)  RESP PANEL BY RT-PCR (RSV, FLU A&B, COVID)  RVPGX2  PROTIME-INR  URINALYSIS, W/ REFLEX TO CULTURE (INFECTION SUSPECTED)  I-STAT CG4 LACTIC ACID, ED  I-STAT CG4 LACTIC ACID, ED    EKG: EKG Interpretation Date/Time:  Monday January 19 2024 06:32:37 EST Ventricular Rate:  102 PR Interval:  116 QRS Duration:  85 QT Interval:  323 QTC Calculation: 421 R Axis:   78  Text Interpretation: Sinus tachycardia Probable anteroseptal infarct, old Confirmed by  Griselda Norris ([id number]) on 01/19/2024 6:48:55 AM  Radiology: [facility] Chest Port 1 View Result Date: 01/19/2024 CLINICAL DATA:  Shortness of breath. EXAM: PORTABLE CHEST 1 VIEW COMPARISON:  12/16/2023. FINDINGS: Mild hyperexpansion. Airspace disease seen previously at the left base has decreased in the interval but is not completely resolved. Streaky density in the infrahilar right lung base may be atelectasis although infiltrate is not excluded in the appearance is more conspicuous than on the prior study. The cardio pericardial silhouette is enlarged. Telemetry leads overlie the chest. IMPRESSION: 1. Interval decrease in left basilar airspace disease. 2. New streaky density in the infrahilar right lung base may be atelectasis although infiltrate is not excluded. Electronically Signed   By: Camellia Candle M.D.   On: 01/19/2024 08:50     .Critical Care  Performed by: Myriam Dorn BROCKS, PA Authorized by: Myriam Dorn BROCKS, PA   Critical care provider statement:    Critical care time (minutes):  30   Critical care time was exclusive of:  Separately billable procedures and treating other patients   Critical care was necessary to treat or prevent imminent or life-threatening deterioration of the following conditions:  Respiratory failure and sepsis   Critical care was time spent personally by me on the following  activities:  Discussions with consultants, discussions with primary provider, development of treatment plan with patient or surrogate, evaluation of patient's response to treatment, examination of patient, obtaining history from patient or surrogate, ordering and performing treatments and interventions, ordering and review of laboratory studies, ordering and review of radiographic studies, pulse oximetry, re-evaluation of patient's condition and review of old charts   I assumed direction of critical care for this patient from another provider in my specialty: no     Care discussed with: admitting provider      Medications Ordered in the ED  lactated ringers  infusion ( Intravenous New Bag/Given 01/19/24 0815)  levofloxacin  (LEVAQUIN ) IVPB 750 mg (750 mg Intravenous New Bag/Given 01/19/24 0818)  ipratropium-albuterol  (DUONEB) 0.5-2.5 (3) MG/3ML nebulizer solution 3 mL (3 mLs Nebulization Given 01/19/24 9187)                                    Medical Decision Making Amount and/or Complexity of Data Reviewed Labs: ordered.  Risk Prescription drug management. Decision regarding hospitalization.   Medical Decision Making:   [name] is a 68 y.o. male who presented to the ED today with increased dyspnea detailed above.    External chart has been reviewed including previous admission records. Patient's presentation is complicated by their history of COPD.  Patient placed on continuous vitals and telemetry monitoring while in ED which was reviewed periodically.  Complete initial physical exam performed, notably the patient  was alert and oriented in no apparent distress.  He is tachypneic, with coarse wheeze throughout all lung fields.  No accessory muscle use noted.   He is febrile at triage. Reviewed and confirmed nursing documentation for past medical history, family history, social history.    Initial Assessment:   With the patient's presentation of increased dyspnea, most likely  secondary to COPD exacerbation and/or acute pneumonia.  Further consider possible pneumothorax, pulmonary embolus.  Also consider possible ACS. This is most consistent with an acute life/limb threatening illness complicated by underlying chronic conditions.  Initial Plan:  Obtain serum lactate and blood cultures secondary to septic presentation on arrival Secondary to recent admission for hypoxic  respiratory failure will continue with Levaquin  for IV antibiotic coverage secondary to his septic presentation. Screening labs including CBC and Metabolic panel to evaluate for infectious or metabolic etiology of disease.  Obtain VBG to obtain acid-base balance, respiratory status Urinalysis with reflex culture ordered to evaluate for UTI or relevant urologic/nephrologic pathology.  CXR to evaluate for structural/infectious intrathoracic pathology.  Provide with DuoNeb for continued management of COPD, dyspnea. Provide IV fluid bolus of lactated Ringer's to prevent and manage fluid volume deficit EKG to evaluate for cardiac pathology Objective evaluation as below reviewed   Initial Study Results:   Laboratory  All laboratory results reviewed without evidence of clinically relevant pathology.   Exceptions include: VBG is showing pCO2 of 73, most likely chronic COPD  EKG EKG was reviewed independently. Rate, rhythm, axis, intervals all examined and without medically relevant abnormality. ST segments without concerns for elevations.    Radiology:  All images reviewed independently. Agree with radiology report at this time.   DG Chest Port 1 View Result Date: 01/19/2024 CLINICAL DATA:  Shortness of breath. EXAM: PORTABLE CHEST 1 VIEW COMPARISON:  12/16/2023. FINDINGS: Mild hyperexpansion. Airspace disease seen previously at the left base has decreased in the interval but is not completely resolved. Streaky density in the infrahilar right lung base may be atelectasis although infiltrate is not excluded  in the appearance is more conspicuous than on the prior study. The cardio pericardial silhouette is enlarged. Telemetry leads overlie the chest. IMPRESSION: 1. Interval decrease in left basilar airspace disease. 2. New streaky density in the infrahilar right lung base may be atelectasis although infiltrate is not excluded. Electronically Signed   By: Camellia Candle M.D.   On: 01/19/2024 08:50      Consults: Case discussed with Dr. Zella with the hospitalist team.   Reassessment and Plan:   After administration of DuoNeb, patient had some improvement in his respiratory status and decreased wheezing however still is mildly tachypneic.  Chest x-ray obtained does show that he has some decreased basilar airspace in the left along with a new streaky density in the infrahilar right lung base.  Given this this is consistent with likely COPD exacerbation and acute pneumonia.  Discussed case with hospitalist team as noted and they agree with need for admission for acute COPD exacerbation and IV antibiotics.  Need for admission was discussed with the patient, they verbalized understanding and agreement have no further concerns.       Final diagnoses:  COPD exacerbation (HCC)  Community acquired pneumonia, unspecified laterality    ED Discharge Orders     None          Myriam Dorn BROCKS, [place] 01/19/24 0956    Myriam Dorn BROCKS, PA 01/19/24 1008    Tegeler, Lonni PARAS, MD 01/19/24 1016  "

## 2024-01-19 NOTE — ED Notes (Signed)
 Requested urine sample from patient again, patient stated he did not have to urinate.

## 2024-01-19 NOTE — ED Notes (Addendum)
 Pt stated you aint getting no blood till I get something to drink. Nurse educated pt, built rapport, and pt complied with labs. Pt continues to make boisterous comments

## 2024-01-19 NOTE — ED Notes (Signed)
 Left patient with a urinal and requested urine sample.

## 2024-01-19 NOTE — ED Triage Notes (Signed)
 Pt came in via EMS from home w/ c/o of SOB x couple of weeks but has become increasingly worse over the past few days. Normally on 6L, but was satting 88%. Hx of COPD and asthma. Febrile on arrival.   125 solumedrol 1 Atrovent   10 mg albuterol 

## 2024-01-19 NOTE — ED Notes (Signed)
 Holding pt in ER per bed request dispute: pt is on too many liters of oxygen

## 2024-01-20 DIAGNOSIS — J9611 Chronic respiratory failure with hypoxia: Secondary | ICD-10-CM | POA: Insufficient documentation

## 2024-01-20 LAB — CBC
HCT: 38.6 % — ABNORMAL LOW (ref 39.0–52.0)
Hemoglobin: 12.9 g/dL — ABNORMAL LOW (ref 13.0–17.0)
MCH: 32.1 pg (ref 26.0–34.0)
MCHC: 33.4 g/dL (ref 30.0–36.0)
MCV: 96 fL (ref 80.0–100.0)
Platelets: 130 K/uL — ABNORMAL LOW (ref 150–400)
RBC: 4.02 MIL/uL — ABNORMAL LOW (ref 4.22–5.81)
RDW: 13.5 % (ref 11.5–15.5)
WBC: 3.5 K/uL — ABNORMAL LOW (ref 4.0–10.5)
nRBC: 0 % (ref 0.0–0.2)

## 2024-01-20 LAB — BASIC METABOLIC PANEL WITH GFR
Anion gap: 3 — ABNORMAL LOW (ref 5–15)
BUN: 11 mg/dL (ref 8–23)
CO2: 36 mmol/L — ABNORMAL HIGH (ref 22–32)
Calcium: 8.6 mg/dL — ABNORMAL LOW (ref 8.9–10.3)
Chloride: 101 mmol/L (ref 98–111)
Creatinine, Ser: 0.79 mg/dL (ref 0.61–1.24)
GFR, Estimated: >60 mL/min
Glucose, Bld: 111 mg/dL — ABNORMAL HIGH (ref 70–99)
Potassium: 4.2 mmol/L (ref 3.5–5.1)
Sodium: 140 mmol/L (ref 135–145)

## 2024-01-20 MED ORDER — SALINE SPRAY 0.65 % NA SOLN
1.0000 | NASAL | Status: DC | PRN
  Administered 2024-01-20: 1 via NASAL
  Filled 2024-01-20: qty 44

## 2024-01-20 MED ORDER — LOSARTAN POTASSIUM 25 MG PO TABS
25.0000 mg | ORAL_TABLET | Freq: Every day | ORAL | Status: DC
  Administered 2024-01-20 – 2024-01-23 (×4): 25 mg via ORAL
  Filled 2024-01-20 (×4): qty 1

## 2024-01-20 NOTE — Progress Notes (Signed)
" °  [facility] Progress Note  Patient: [name]     FMW:[id number]  DOA: 01/19/2024   PCP: Patient, No Pcp Per       Brief hospital course: 69 y/o with COPD on 6 L O2 at baseline, asthma, tobacco abuse, HTN admitted for progressive cough and dyspnea. He admitted to progressive weakness as well.  In ED > temp 101.3, HR in 100s CXR R infrahilar infiltrates vs atelectasis Resp panel negatvie.  Subjective:  Has a stuffy nose today. Persistant dyspnea but not as severe as yesterday. No cough.   Assessment and Plan: Principal Problem:   Sepsis due to pneumonia  Chronic respiratory failure with hypoxia (HCC) - continue Levaquin ,  Flonase  and nasal saline  - cont O 2 and IV Solumedrol - home tomorrow if no recurrence of fevers or worsening dyspnea  Active Problems:   Tobacco abuse - has been counseled    Essential hypertension - resume Losartan          Code Status: Full Code Total time on patient care: 35 min DVT prophylaxis:  enoxaparin  (LOVENOX ) injection 40 mg Start: 01/19/24 1600     Objective:   Vitals:   01/19/24 2017 01/19/24 2039 01/20/24 0019 01/20/24 0656  BP:  (!) 140/71 (!) 141/84 (!) 140/75  Pulse:  89 84 85  Resp:  18 19 20   Temp:  97.7 F (36.5 C) 97.7 F (36.5 C) 97.6 F (36.4 C)  TempSrc:  Oral Oral Oral  SpO2: 97% 100% 100% 98%  Weight:      Height:       Filed Weights   01/19/24 1712  Weight: 99 kg   Exam: General exam: Appears comfortable  HEENT: oral mucosa moist Respiratory system: Clear to auscultation. Very poor breath sounds  Cardiovascular system: S1 & S2 heard  Gastrointestinal system: Abdomen soft, non-tender, nondistended. Normal bowel sounds   Extremities: No cyanosis, clubbing or edema Psychiatry:  Mood & affect appropriate.      CBC: Recent Labs  Lab 01/19/24 0657 01/20/24 0522  WBC 5.1 3.5*  NEUTROABS 2.1  --   HGB 14.6 12.9*  HCT 44.9 38.6*  MCV 98.5 96.0  PLT 127* 130*   Basic Metabolic  Panel: Recent Labs  Lab 01/19/24 0657 01/20/24 0522  NA 140 140  K 4.3 4.2  CL 99 101  CO2 35* 36*  GLUCOSE 124* 111*  BUN 11 11  CREATININE 0.92 0.79  CALCIUM  9.0 8.6*     Scheduled Meds:  atorvastatin   40 mg Oral Daily   enoxaparin  (LOVENOX ) injection  40 mg Subcutaneous Q24H   ipratropium-albuterol   3 mL Nebulization QID   methylPREDNISolone  (SOLU-MEDROL ) injection  40 mg Intravenous Daily   montelukast   10 mg Oral QPM    Imaging and lab data personally reviewed   Author: Tymia Streb  01/20/2024 7:42 AM  To contact [facility]>   Check the care team in [facility] and look for the attending/consulting [facility] provider listed  Log into [email or web address] and use [place]'s universal password   Go to> [facility]  and find provider  If you still have difficulty reaching the provider, please page the [facility] (Director on Call) for the Hospitalists listed on amion     "

## 2024-01-20 NOTE — Plan of Care (Signed)
" °  Problem: Clinical Measurements: Goal: Cardiovascular complication will be avoided Outcome: Progressing   Problem: Clinical Measurements: Goal: Respiratory complications will improve Outcome: Progressing   Problem: Pain Managment: Goal: General experience of comfort will improve and/or be controlled Outcome: Progressing   Problem: Safety: Goal: Ability to remain free from injury will improve Outcome: Progressing   Problem: Elimination: Goal: Will not experience complications related to urinary retention Outcome: Progressing   Problem: Elimination: Goal: Will not experience complications related to bowel motility Outcome: Progressing   "

## 2024-01-21 DIAGNOSIS — A419 Sepsis, unspecified organism: Secondary | ICD-10-CM | POA: Diagnosis not present

## 2024-01-21 DIAGNOSIS — J189 Pneumonia, unspecified organism: Secondary | ICD-10-CM | POA: Diagnosis not present

## 2024-01-21 MED ORDER — BUDESONIDE 0.5 MG/2ML IN SUSP
0.5000 mg | Freq: Two times a day (BID) | RESPIRATORY_TRACT | Status: DC
  Administered 2024-01-21 – 2024-01-23 (×5): 0.5 mg via RESPIRATORY_TRACT
  Filled 2024-01-21 (×5): qty 2

## 2024-01-21 MED ORDER — METHYLPREDNISOLONE SODIUM SUCC 40 MG IJ SOLR
40.0000 mg | Freq: Once | INTRAMUSCULAR | Status: AC
  Administered 2024-01-21: 40 mg via INTRAVENOUS

## 2024-01-21 MED ORDER — METHYLPREDNISOLONE SODIUM SUCC 125 MG IJ SOLR
80.0000 mg | Freq: Two times a day (BID) | INTRAMUSCULAR | Status: DC
  Administered 2024-01-21 – 2024-01-23 (×4): 80 mg via INTRAVENOUS
  Filled 2024-01-21 (×4): qty 2

## 2024-01-21 NOTE — TOC Initial Note (Signed)
 Transition of Care [facility]) - Initial/Assessment Note   Patient Details  Name: [name] MRN: [id number] Date of Birth: Dec 16, 1955  [facility]) CM/SW Contact:    Duwaine GORMAN Aran, LCSW Phone Number: 01/21/2024, 8:54 AM  Clinical Narrative: Patient is from home and is on 6L/min home oxygen through [employer]. Patient is currently receiving IV antibiotics. Care management following for possible discharge needs.  Expected Discharge Plan: Home/Self Care Barriers to Discharge: Continued Medical Work up  Expected Discharge Plan and Services In-house Referral: Clinical Social Work Living arrangements for the past 2 months: Single Family Home            DME Arranged: N/A DME Agency: NA  Prior Living Arrangements/Services Living arrangements for the past 2 months: Single Family Home Lives with:: Self Patient language and need for interpreter reviewed:: Yes Do you feel safe going back to the place where you live?: Yes      Need for Family Participation in Patient Care: No (Comment) Care giver support system in place?: Yes (comment) Current home services: DME (6L/min home oxygen through [employer]) Criminal Activity/Legal Involvement Pertinent to Current Situation/Hospitalization: No - Comment as needed  Activities of Daily Living ADL Screening (condition at time of admission) Independently performs ADLs?: Yes (appropriate for developmental age) Is the patient deaf or have difficulty hearing?: No Does the patient have difficulty seeing, even when wearing glasses/contacts?: No Does the patient have difficulty concentrating, remembering, or making decisions?: No  Permission Sought/Granted Permission granted to share information with : Yes, Verbal Permission Granted  Emotional Assessment Orientation: : Oriented to Self, Oriented to Place, Oriented to  Time, Oriented to Situation Alcohol / Substance Use: Not Applicable Psych Involvement: No (comment)  Admission diagnosis:   Pneumonia [J18.9] COPD exacerbation (HCC) [J44.1] Sepsis due to pneumonia (HCC) [J18.9, A41.9] Community acquired pneumonia, unspecified laterality [J18.9] Patient Active Problem List   Diagnosis Date Noted   Chronic respiratory failure with hypoxia (HCC) 01/20/2024   Sepsis due to pneumonia (HCC) 01/19/2024   Class 1 obesity 12/18/2023   Grade I diastolic dysfunction 12/14/2023   COPD exacerbation (HCC) 12/13/2022   Medication therapy management recommendation declined by patient 10/10/2022   Hyperlipidemia 05/22/2022   HCAP (healthcare-associated pneumonia) 05/21/2022   Acute respiratory distress 05/11/2022   Lobar pneumonia 05/10/2022   Hx of adenomatous polyp of colon 01/13/2020   COPD with acute exacerbation (HCC) 11/23/2016   Diabetes mellitus screening 03/12/2016   Essential hypertension 10/22/2013   Elevated BP 07/19/2013   Obesity 02/04/2012   Acute respiratory failure with hypoxia and hypercapnia (HCC) 02/04/2012   Tobacco abuse 02/03/2012   PCP:  Patient, No Pcp Per Pharmacy:   [facility] - [facility] [address] [place] [address] Phone: [phone] Fax: [phone]  [facility] - [facility] [address] [place] [address] Phone: [phone] Fax: [phone]  [id number] - [place], [place] - [address] [facility] [address] [place] [address] Phone: [phone] Fax: [phone]  Social Drivers of Health (SDOH) Social History: SDOH Screenings   Food Insecurity: No Food Insecurity (01/19/2024)  Housing: Low Risk (01/19/2024)  Recent Concern: Housing - High Risk (12/14/2023)  Transportation Needs: No Transportation Needs (01/19/2024)  Utilities: Not At Risk (01/19/2024)  Alcohol Screen: Low Risk (05/06/2022)  Depression (PHQ2-9): Low Risk (01/09/2023)  Financial Resource Strain: Medium Risk (01/09/2023)  Physical  Activity: Insufficiently Active (01/09/2023)  Social Connections: Socially  Isolated (01/19/2024)  Stress: No Stress Concern Present (05/06/2022)  Tobacco Use: High Risk (01/19/2024)  Health Literacy: Adequate Health Literacy (01/09/2023)   SDOH Interventions:    Readmission Risk Interventions    12/17/2023    3:01 PM 12/16/2022   11:30 AM 05/13/2022   10:11 AM  Readmission Risk Prevention Plan  Post Dischage Appt Complete Complete Complete  Medication Screening Complete Complete Complete  Transportation Screening Complete Complete Complete

## 2024-01-21 NOTE — Plan of Care (Signed)

## 2024-01-21 NOTE — Progress Notes (Signed)
 " PROGRESS NOTE    Kirk Basquez  FMW:[id number] DOB: 1955-10-13 DOA: 01/19/2024 PCP: Patient, No Pcp Per   Brief Narrative:  69 year old male with history of COPD on 6 L oxygen at baseline, asthma, tobacco abuse, hypertension presented with worsening cough and dyspnea and was admitted for pneumonia and COPD exacerbation and has been started on IV antibiotics and Solu-Medrol .  Assessment & Plan:   Sepsis: Due to pneumonia: Present on admission Community-acquired bacterial pneumonia: Bacteria unspecified - Hemodynamically improving.  Continue Levaquin . Blood cultures negative so far.  COVID/influenza/RSV PCR negative on presentation  COPD exacerbation Chronic respiratory failure with hypoxia Tobacco use -On 6 L oxygen by nasal cannula normally at home.  Currently on the same. - Patient still does not feel well.  Increase Solu-Medrol  to 80 mg IV every 12 hours.  Continue current nebs.  Continue montelukast .  Add budesonide  neb  cc - Counseled regarding tobacco cessation by prior hospitalist  Obesity class I - Outpatient follow-up  Essential hypertension -Will continue losartan   Pancytopenia - Questionable cause.  Monitor intermittently.  No signs of bleeding.    DVT prophylaxis: Lovenox   Code Status: Full Family Communication: None at bedside Disposition Plan: Status is: Inpatient Remains inpatient appropriate because: Of severity of illness    Consultants: None  Procedures: None  Antimicrobials: Levaquin    Subjective: Patient seen and examined at bedside.  Does not feel better and does not feel ready to go home today.  Still short of breath with minimal exertion and coughing.  No fever or vomiting reported.  Objective: Vitals:   01/20/24 1527 01/20/24 1946 01/20/24 2108 01/21/24 0613  BP:   125/74 118/70  Pulse:   92 84  Resp:   20 18  Temp:   (!) 97.4 F (36.3 C) (!) 97.4 F (36.3 C)  TempSrc:   Oral Oral  SpO2: 97% 98% 98% 100%  Weight:       Height:        Intake/Output Summary (Last 24 hours) at 01/21/2024 1014 Last data filed at 01/20/2024 2300 Gross per 24 hour  Intake 390 ml  Output 400 ml  Net -10 ml   Filed Weights   01/19/24 1712  Weight: 99 kg    Examination:  General exam: Appears calm and comfortable.  Looks chronically ill and deconditioned Respiratory system: Bilateral decreased breath sounds at bases with scattered wheezing Cardiovascular system: S1 & S2 heard, Rate controlled Gastrointestinal system: Abdomen is nondistended, soft and nontender. Normal bowel sounds heard. Extremities: No cyanosis, clubbing, edema  Central nervous system: Alert and oriented. No focal neurological deficits. Moving extremities Skin: No rashes, lesions or ulcers Psychiatry: Flat affect.  Not agitated.    Data Reviewed: I have personally reviewed following labs and imaging studies  CBC: Recent Labs  Lab 01/19/24 0657 01/20/24 0522  WBC 5.1 3.5*  NEUTROABS 2.1  --   HGB 14.6 12.9*  HCT 44.9 38.6*  MCV 98.5 96.0  PLT 127* 130*   Basic Metabolic Panel: Recent Labs  Lab 01/19/24 0657 01/20/24 0522  NA 140 140  K 4.3 4.2  CL 99 101  CO2 35* 36*  GLUCOSE 124* 111*  BUN 11 11  CREATININE 0.92 0.79  CALCIUM  9.0 8.6*   GFR: Estimated Creatinine Clearance: 103.9 mL/min (by C-G formula based on SCr of 0.79 mg/dL). Liver Function Tests: Recent Labs  Lab 01/19/24 0657  AST 31  ALT 17  ALKPHOS 104  BILITOT 0.4  PROT 6.8  ALBUMIN 4.0  No results for input(s): LIPASE, AMYLASE in the last 168 hours. No results for input(s): AMMONIA in the last 168 hours. Coagulation Profile: Recent Labs  Lab 01/19/24 0657  INR 1.0   Cardiac Enzymes: No results for input(s): CKTOTAL, CKMB, CKMBINDEX, TROPONINI in the last 168 hours. BNP (last 3 results) Recent Labs    12/14/23 0705  PROBNP 138.0   HbA1C: No results for input(s): HGBA1C in the last 72 hours. CBG: No results for input(s): GLUCAP  in the last 168 hours. Lipid Profile: No results for input(s): CHOL, HDL, LDLCALC, TRIG, CHOLHDL, LDLDIRECT in the last 72 hours. Thyroid Function Tests: No results for input(s): TSH, T4TOTAL, FREET4, T3FREE, THYROIDAB in the last 72 hours. Anemia Panel: No results for input(s): VITAMINB12, FOLATE, FERRITIN, TIBC, IRON, RETICCTPCT in the last 72 hours. Sepsis Labs: Recent Labs  Lab 01/19/24 0701 01/19/24 0831  LATICACIDVEN 1.3 0.6    Recent Results (from the past 240 hours)  Culture, blood (Routine x 2)     Status: None (Preliminary result)   Collection Time: 01/19/24  6:50 AM   Specimen: BLOOD  Result Value Ref Range Status   Specimen Description   Final    BLOOD SITE NOT SPECIFIED Performed at [facility], [address], [place], [place] [address]    Special Requests   Final    BOTTLES DRAWN AEROBIC AND ANAEROBIC Blood Culture adequate volume Performed at [facility], [address], [place], [place] [address]    Culture   Final    NO GROWTH 2 DAYS Performed at [facility], [address] [place], [place] [address]    Report Status PENDING  Incomplete  Resp panel by RT-PCR (RSV, Flu A&B, Covid) Anterior Nasal Swab     Status: None   Collection Time: 01/19/24  6:58 AM   Specimen: Anterior Nasal Swab  Result Value Ref Range Status   SARS Coronavirus 2 by RT PCR NEGATIVE NEGATIVE Final    Comment: (NOTE) SARS-CoV-2 target nucleic acids are NOT DETECTED.  The SARS-CoV-2 RNA is generally detectable in upper respiratory specimens during the acute phase of infection. The lowest concentration of SARS-CoV-2 viral copies this assay can detect is 138 copies/mL. A negative result does not preclude SARS-Cov-2 infection and should not be used as the sole basis for treatment or other patient management decisions. A negative result may occur with  improper specimen collection/handling, submission of  specimen other than nasopharyngeal swab, presence of viral mutation(s) within the areas targeted by this assay, and inadequate number of viral copies(<138 copies/mL). A negative result must be combined with clinical observations, patient history, and epidemiological information. The expected result is Negative.  Fact Sheet for Patients:  bloggercourse.com  Fact Sheet for Healthcare Providers:  seriousbroker.it  This test is no t yet approved or cleared by the [place]  FDA and  has been authorized for detection and/or diagnosis of SARS-CoV-2 by FDA under an Emergency Use Authorization (EUA). This EUA will remain  in effect (meaning this test can be used) for the duration of the COVID-19 declaration under Section 564(b)(1) of the Act, 21 U.S.C.section 360bbb-3(b)(1), unless the authorization is terminated  or revoked sooner.       Influenza A by PCR NEGATIVE NEGATIVE Final   Influenza B by PCR NEGATIVE NEGATIVE Final    Comment: (NOTE) The Xpert Xpress SARS-CoV-2/FLU/RSV plus assay is intended as an aid in the diagnosis of influenza from Nasopharyngeal swab specimens and should not be used  as a sole basis for treatment. Nasal washings and aspirates are unacceptable for Xpert Xpress SARS-CoV-2/FLU/RSV testing.  Fact Sheet for Patients: bloggercourse.com  Fact Sheet for Healthcare Providers: seriousbroker.it  This test is not yet approved or cleared by the [place]  FDA and has been authorized for detection and/or diagnosis of SARS-CoV-2 by FDA under an Emergency Use Authorization (EUA). This EUA will remain in effect (meaning this test can be used) for the duration of the COVID-19 declaration under Section 564(b)(1) of the Act, 21 U.S.C. section 360bbb-3(b)(1), unless the authorization is terminated or revoked.     Resp Syncytial Virus by PCR NEGATIVE NEGATIVE Final     Comment: (NOTE) Fact Sheet for Patients: bloggercourse.com  Fact Sheet for Healthcare Providers: seriousbroker.it  This test is not yet approved or cleared by the [place]  FDA and has been authorized for detection and/or diagnosis of SARS-CoV-2 by FDA under an Emergency Use Authorization (EUA). This EUA will remain in effect (meaning this test can be used) for the duration of the COVID-19 declaration under Section 564(b)(1) of the Act, 21 U.S.C. section 360bbb-3(b)(1), unless the authorization is terminated or revoked.  Performed at [facility], [address] [place], [place] [address]   Culture, blood (Routine x 2)     Status: None (Preliminary result)   Collection Time: 01/19/24  6:59 AM   Specimen: BLOOD RIGHT WRIST  Result Value Ref Range Status   Specimen Description   Final    BLOOD RIGHT WRIST Performed at [facility], [address] [place], [place] [address]    Special Requests   Final    BOTTLES DRAWN AEROBIC AND ANAEROBIC Blood Culture adequate volume Performed at [facility], [address], [place], [place] [address]    Culture   Final    NO GROWTH 2 DAYS Performed at [facility], [address] [place], [place] [address]    Report Status PENDING  Incomplete         Radiology Studies: No results found.      Scheduled Meds:  atorvastatin   40 mg Oral Daily   enoxaparin  (LOVENOX ) injection  40 mg Subcutaneous Q24H   ipratropium-albuterol   3 mL Nebulization QID   losartan   25 mg Oral Daily   methylPREDNISolone  (SOLU-MEDROL ) injection  40 mg Intravenous Daily   montelukast   10 mg Oral QPM   Continuous Infusions:  levofloxacin  (LEVAQUIN ) IV 750 mg (01/21/24 0757)          Sophie Mao, MD [facility] 01/21/2024, 10:14 AM   "

## 2024-01-22 DIAGNOSIS — J189 Pneumonia, unspecified organism: Secondary | ICD-10-CM | POA: Diagnosis not present

## 2024-01-22 DIAGNOSIS — A419 Sepsis, unspecified organism: Secondary | ICD-10-CM | POA: Diagnosis not present

## 2024-01-22 NOTE — TOC Progression Note (Signed)
 Transition of Care [facility]) - Progression Note    Patient Details  Name: [name] MRN: [id number] Date of Birth: 05-Jul-1955  Transition of Care [facility]) CM/SW Contact  Sonda Manuella Quill, RN Phone Number: 01/22/2024, 5:55 PM  Clinical Narrative:    [place] w/ pt in room; pt said he will have family bring a full travel tank to room at discharge; IP CM is following.   Expected Discharge Plan: Home/Self Care Barriers to Discharge: Continued Medical Work up               Expected Discharge Plan and Services In-house Referral: Clinical Social Work     Living arrangements for the past 2 months: Single Family Home                 DME Arranged: N/A DME Agency: NA                   Social Drivers of Health (SDOH) Interventions SDOH Screenings   Food Insecurity: No Food Insecurity (01/19/2024)  Housing: Low Risk (01/19/2024)  Recent Concern: Housing - High Risk (12/14/2023)  Transportation Needs: No Transportation Needs (01/19/2024)  Utilities: Not At Risk (01/19/2024)  Alcohol Screen: Low Risk (05/06/2022)  Depression (PHQ2-9): Low Risk (01/09/2023)  Financial Resource Strain: Medium Risk (01/09/2023)  Physical Activity: Insufficiently Active (01/09/2023)  Social Connections: Socially Isolated (01/19/2024)  Stress: No Stress Concern Present (05/06/2022)  Tobacco Use: High Risk (01/19/2024)  Health Literacy: Adequate Health Literacy (01/09/2023)    Readmission Risk Interventions    12/17/2023    3:01 PM 12/16/2022   11:30 AM 05/13/2022   10:11 AM  Readmission Risk Prevention Plan  Post Dischage Appt Complete Complete Complete  Medication Screening Complete Complete Complete  Transportation Screening Complete Complete Complete

## 2024-01-22 NOTE — Progress Notes (Signed)
 " PROGRESS NOTE    Javarion Douty  FMW:[id number] DOB: 1955/08/31 DOA: 01/19/2024 PCP: Patient, No Pcp Per   Brief Narrative:  69 year old male with history of COPD on 6 L oxygen at baseline, asthma, tobacco abuse, hypertension presented with worsening cough and dyspnea and was admitted for pneumonia and COPD exacerbation and has been started on IV antibiotics and Solu-Medrol .  Assessment & Plan:   Sepsis: Due to pneumonia: Present on admission Community-acquired bacterial pneumonia: Bacteria unspecified - Hemodynamically improving.  Continue Levaquin . Blood cultures negative so far.  COVID/influenza/RSV PCR negative on presentation  COPD exacerbation Chronic respiratory failure with hypoxia Tobacco use -On 6 L oxygen by nasal cannula normally at home.  Currently on the same. - Patient still does not feel well.  Continue Solu-Medrol  80 mg IV every 12 hours.  Continue current nebs.  Continue montelukast .   - Counseled regarding tobacco cessation by prior hospitalist  Obesity class I - Outpatient follow-up  Essential hypertension -Will continue losartan   Pancytopenia - Questionable cause.  Monitor intermittently.  No signs of bleeding.    DVT prophylaxis: Lovenox   Code Status: Full Family Communication: None at bedside Disposition Plan: Status is: Inpatient Remains inpatient appropriate because: Of severity of illness    Consultants: None  Procedures: None  Antimicrobials: Levaquin    Subjective: Patient seen and examined at bedside.  Does not feel well and does not feel ready to go home today.  Continues to have shortness of breath with minimal exertion and cough.  No fever or vomiting reported. Objective: Vitals:   01/21/24 1348 01/21/24 2019 01/22/24 0537 01/22/24 0814  BP: 131/68 128/74 118/76   Pulse: 93 87 75   Resp: 18 15 15    Temp: 97.7 F (36.5 C) 97.8 F (36.6 C) 98 F (36.7 C)   TempSrc: Oral Oral    SpO2: 99% 97% 100% 97%  Weight:       Height:        Intake/Output Summary (Last 24 hours) at 01/22/2024 0900 Last data filed at 01/21/2024 2000 Gross per 24 hour  Intake 510 ml  Output --  Net 510 ml   Filed Weights   01/19/24 1712  Weight: 99 kg    Examination:  General: On 6L oxygen via nasal cannula.  No distress ENT/neck: No thyromegaly.  JVD is not elevated  respiratory: Decreased breath sounds at bases bilaterally with some crackles and wheezing  CVS: S1-S2 heard, rate controlled currently Abdominal: Soft, nontender, slightly distended; no organomegaly, normal bowel sounds are heard Extremities: Trace lower extremity edema; no cyanosis  CNS: Awake and alert.  No focal neurologic deficit.  Moves extremities Lymph: No obvious lymphadenopathy Skin: No obvious ecchymosis/lesions  psych: Mostly flat affect.  Not agitated currently.   Musculoskeletal: No obvious joint swelling/deformity     Data Reviewed: I have personally reviewed following labs and imaging studies  CBC: Recent Labs  Lab 01/19/24 0657 01/20/24 0522  WBC 5.1 3.5*  NEUTROABS 2.1  --   HGB 14.6 12.9*  HCT 44.9 38.6*  MCV 98.5 96.0  PLT 127* 130*   Basic Metabolic Panel: Recent Labs  Lab 01/19/24 0657 01/20/24 0522  NA 140 140  K 4.3 4.2  CL 99 101  CO2 35* 36*  GLUCOSE 124* 111*  BUN 11 11  CREATININE 0.92 0.79  CALCIUM  9.0 8.6*   GFR: Estimated Creatinine Clearance: 103.9 mL/min (by C-G formula based on SCr of 0.79 mg/dL). Liver Function Tests: Recent Labs  Lab 01/19/24 0657  AST  31  ALT 17  ALKPHOS 104  BILITOT 0.4  PROT 6.8  ALBUMIN 4.0   No results for input(s): LIPASE, AMYLASE in the last 168 hours. No results for input(s): AMMONIA in the last 168 hours. Coagulation Profile: Recent Labs  Lab 01/19/24 0657  INR 1.0   Cardiac Enzymes: No results for input(s): CKTOTAL, CKMB, CKMBINDEX, TROPONINI in the last 168 hours. BNP (last 3 results) Recent Labs    12/14/23 0705  PROBNP 138.0    HbA1C: No results for input(s): HGBA1C in the last 72 hours. CBG: No results for input(s): GLUCAP in the last 168 hours. Lipid Profile: No results for input(s): CHOL, HDL, LDLCALC, TRIG, CHOLHDL, LDLDIRECT in the last 72 hours. Thyroid Function Tests: No results for input(s): TSH, T4TOTAL, FREET4, T3FREE, THYROIDAB in the last 72 hours. Anemia Panel: No results for input(s): VITAMINB12, FOLATE, FERRITIN, TIBC, IRON, RETICCTPCT in the last 72 hours. Sepsis Labs: Recent Labs  Lab 01/19/24 0701 01/19/24 0831  LATICACIDVEN 1.3 0.6    Recent Results (from the past 240 hours)  Culture, blood (Routine x 2)     Status: None (Preliminary result)   Collection Time: 01/19/24  6:50 AM   Specimen: BLOOD  Result Value Ref Range Status   Specimen Description   Final    BLOOD SITE NOT SPECIFIED Performed at [facility], [address], [place], [place] [address]    Special Requests   Final    BOTTLES DRAWN AEROBIC AND ANAEROBIC Blood Culture adequate volume Performed at [facility], [address] [place], [place] [address]    Culture   Final    NO GROWTH 3 DAYS Performed at [facility], [address] [place], [place] [address]    Report Status PENDING  Incomplete  Resp panel by RT-PCR (RSV, Flu A&B, Covid) Anterior Nasal Swab     Status: None   Collection Time: 01/19/24  6:58 AM   Specimen: Anterior Nasal Swab  Result Value Ref Range Status   SARS Coronavirus 2 by RT PCR NEGATIVE NEGATIVE Final    Comment: (NOTE) SARS-CoV-2 target nucleic acids are NOT DETECTED.  The SARS-CoV-2 RNA is generally detectable in upper respiratory specimens during the acute phase of infection. The lowest concentration of SARS-CoV-2 viral copies this assay can detect is 138 copies/mL. A negative result does not preclude SARS-Cov-2 infection and should not be used as the sole basis for treatment or other patient  management decisions. A negative result may occur with  improper specimen collection/handling, submission of specimen other than nasopharyngeal swab, presence of viral mutation(s) within the areas targeted by this assay, and inadequate number of viral copies(<138 copies/mL). A negative result must be combined with clinical observations, patient history, and epidemiological information. The expected result is Negative.  Fact Sheet for Patients:  bloggercourse.com  Fact Sheet for Healthcare Providers:  seriousbroker.it  This test is no t yet approved or cleared by the [place]  FDA and  has been authorized for detection and/or diagnosis of SARS-CoV-2 by FDA under an Emergency Use Authorization (EUA). This EUA will remain  in effect (meaning this test can be used) for the duration of the COVID-19 declaration under Section 564(b)(1) of the Act, 21 U.S.C.section 360bbb-3(b)(1), unless the authorization is terminated  or revoked sooner.       Influenza A by PCR NEGATIVE NEGATIVE Final   Influenza B by PCR NEGATIVE NEGATIVE Final    Comment: (NOTE) The Xpert Xpress SARS-CoV-2/FLU/RSV plus assay is  intended as an aid in the diagnosis of influenza from Nasopharyngeal swab specimens and should not be used as a sole basis for treatment. Nasal washings and aspirates are unacceptable for Xpert Xpress SARS-CoV-2/FLU/RSV testing.  Fact Sheet for Patients: bloggercourse.com  Fact Sheet for Healthcare Providers: seriousbroker.it  This test is not yet approved or cleared by the [place]  FDA and has been authorized for detection and/or diagnosis of SARS-CoV-2 by FDA under an Emergency Use Authorization (EUA). This EUA will remain in effect (meaning this test can be used) for the duration of the COVID-19 declaration under Section 564(b)(1) of the Act, 21 U.S.C. section 360bbb-3(b)(1),  unless the authorization is terminated or revoked.     Resp Syncytial Virus by PCR NEGATIVE NEGATIVE Final    Comment: (NOTE) Fact Sheet for Patients: bloggercourse.com  Fact Sheet for Healthcare Providers: seriousbroker.it  This test is not yet approved or cleared by the [place]  FDA and has been authorized for detection and/or diagnosis of SARS-CoV-2 by FDA under an Emergency Use Authorization (EUA). This EUA will remain in effect (meaning this test can be used) for the duration of the COVID-19 declaration under Section 564(b)(1) of the Act, 21 U.S.C. section 360bbb-3(b)(1), unless the authorization is terminated or revoked.  Performed at [facility], [address] [place], [place] [address]   Culture, blood (Routine x 2)     Status: None (Preliminary result)   Collection Time: 01/19/24  6:59 AM   Specimen: BLOOD RIGHT WRIST  Result Value Ref Range Status   Specimen Description   Final    BLOOD RIGHT WRIST Performed at [facility], [address], [place], [place] [address]    Special Requests   Final    BOTTLES DRAWN AEROBIC AND ANAEROBIC Blood Culture adequate volume Performed at [facility], [address] [place], [place] [address]    Culture   Final    NO GROWTH 3 DAYS Performed at [facility], [address], [place], [place] [address]    Report Status PENDING  Incomplete         Radiology Studies: No results found.      Scheduled Meds:  atorvastatin   40 mg Oral Daily   budesonide  (PULMICORT ) nebulizer solution  0.5 mg Nebulization BID   enoxaparin  (LOVENOX ) injection  40 mg Subcutaneous Q24H   ipratropium-albuterol   3 mL Nebulization QID   losartan   25 mg Oral Daily   methylPREDNISolone  (SOLU-MEDROL ) injection  80 mg Intravenous Q12H   montelukast   10 mg Oral QPM   Continuous Infusions:  levofloxacin  (LEVAQUIN ) IV Stopped (01/21/24  9072)          Sophie Mao, MD [facility] 01/22/2024, 9:00 AM   "

## 2024-01-22 NOTE — Plan of Care (Signed)

## 2024-01-22 NOTE — TOC Progression Note (Signed)
 Transition of Care [facility]) - Progression Note    Patient Details  Name: [name] MRN: [id number] Date of Birth: 1955/09/13  Transition [facility]) CM/SW Contact  Sonda Manuella Quill, RN Phone Number: 01/22/2024, 3:28 PM  Clinical Narrative:    Pt on 6LNC (baseline); IP CM is following.   Expected Discharge Plan: Home/Self Care Barriers to Discharge: Continued Medical Work up               Expected Discharge Plan and Services In-house Referral: Clinical Social Work     Living arrangements for the past 2 months: Single Family Home                 DME Arranged: N/A DME Agency: NA                   Social Drivers of Health (SDOH) Interventions SDOH Screenings   Food Insecurity: No Food Insecurity (01/19/2024)  Housing: Low Risk (01/19/2024)  Recent Concern: Housing - High Risk (12/14/2023)  Transportation Needs: No Transportation Needs (01/19/2024)  Utilities: Not At Risk (01/19/2024)  Alcohol Screen: Low Risk (05/06/2022)  Depression (PHQ2-9): Low Risk (01/09/2023)  Financial Resource Strain: Medium Risk (01/09/2023)  Physical Activity: Insufficiently Active (01/09/2023)  Social Connections: Socially Isolated (01/19/2024)  Stress: No Stress Concern Present (05/06/2022)  Tobacco Use: High Risk (01/19/2024)  Health Literacy: Adequate Health Literacy (01/09/2023)    Readmission Risk Interventions    12/17/2023    3:01 PM 12/16/2022   11:30 AM 05/13/2022   10:11 AM  Readmission Risk Prevention Plan  Post Dischage Appt Complete Complete Complete  Medication Screening Complete Complete Complete  Transportation Screening Complete Complete Complete

## 2024-01-23 ENCOUNTER — Other Ambulatory Visit (HOSPITAL_COMMUNITY): Payer: Self-pay

## 2024-01-23 MED ORDER — IPRATROPIUM-ALBUTEROL 0.5-2.5 (3) MG/3ML IN SOLN: 3.0000 mL | Freq: Three times a day (TID) | RESPIRATORY_TRACT | Status: DC

## 2024-01-23 MED ORDER — IPRATROPIUM-ALBUTEROL 0.5-2.5 (3) MG/3ML IN SOLN: 3.0000 mL | Freq: Four times a day (QID) | RESPIRATORY_TRACT | Status: AC

## 2024-01-23 MED ORDER — ALBUTEROL SULFATE HFA 108 (90 BASE) MCG/ACT IN AERS
2.0000 | INHALATION_SPRAY | RESPIRATORY_TRACT | 0 refills | Status: DC | PRN
  Filled 2024-01-23: qty 6.7, 25d supply, fill #0

## 2024-01-23 MED ORDER — ROBAFEN DM 20-200 MG/20ML PO LIQD
5.0000 mL | ORAL | 1 refills | Status: AC | PRN
  Filled 2024-01-23: qty 118, 7d supply, fill #0

## 2024-01-23 MED ORDER — PREDNISONE 20 MG PO TABS
40.0000 mg | ORAL_TABLET | Freq: Every day | ORAL | 0 refills | Status: AC
  Filled 2024-01-23: qty 14, 7d supply, fill #0

## 2024-01-23 MED ORDER — BREZTRI AEROSPHERE 160-9-4.8 MCG/ACT IN AERO
2.0000 | INHALATION_SPRAY | Freq: Two times a day (BID) | RESPIRATORY_TRACT | 0 refills | Status: DC
  Filled 2024-01-23: qty 10.7, 30d supply, fill #0

## 2024-01-23 MED ORDER — FLUTICASONE PROPIONATE 50 MCG/ACT NA SUSP: 2.0000 | Freq: Every day | NASAL | Status: AC | PRN

## 2024-01-23 NOTE — Discharge Summary (Addendum)
 Physician Discharge Summary  Crews Mccollam FMW:[id number] DOB: [birth year] DOA: 01/19/2024  PCP: Patient, No Pcp Per  Admit date: 01/19/2024 Discharge date: 01/23/2024  Admitted From: Home Disposition: Home  Recommendations for Outpatient Follow-up:  Follow up with PCP in 1 week with repeat CBC/BMP Outpatient follow-up with pulmonary Follow up in ED if symptoms worsen or new appear   Home Health: No Equipment/Devices: Continue supplemental oxygen via nasal cannula  Discharge Condition: Stable CODE STATUS: Full Diet recommendation: Heart healthy  Brief/Interim Summary: 69 year old male with history of COPD on 6 L oxygen at baseline, asthma, tobacco abuse, hypertension presented with worsening cough and dyspnea and was admitted for pneumonia and COPD exacerbation and has been started on IV antibiotics and Solu-Medrol .  During the hospitalization, his condition has gradually improved.  He feels better today and wants to go home today.  He will be discharged home on oral prednisone .  He has completed 5-day course of oral Levaquin  and does not need any more antibiotic.  Outpatient follow-up with PCP and pulmonary.  Discharge Diagnoses:   Sepsis: Due to pneumonia: Present on admission: Resolved Community-acquired bacterial pneumonia: Bacteria unspecified - Hemodynamically improving.  He has completed 5-day course of oral Levaquin  and does not need any more antibiotic. Blood cultures negative so far.  COVID/influenza/RSV PCR negative on presentation -Currently hemodynamically stable.   COPD exacerbation Chronic respiratory failure with hypoxia Tobacco use -On 6 L oxygen by nasal cannula normally at home.  Currently on the same. - Currently on IV Solu-Medrol .  - Counseled regarding tobacco cessation by prior hospitalist -He feels better today and wants to go home today.  He will be discharged home on oral prednisone  40 mg daily for 7 days.  Continue home inhaled and nebulized regimen.   Outpatient follow-up with pulmonary.  Obesity class I - Outpatient follow-up   Essential hypertension -Will continue losartan    Pancytopenia - Questionable cause.  Monitor intermittently as an outpatient.  No signs of bleeding.     Discharge Instructions  Discharge Instructions     Diet - low sodium heart healthy   Complete by: As directed    Increase activity slowly   Complete by: As directed       Allergies as of 01/23/2024       Reactions   Codeine Itching   Penicillins Itching        Medication List     TAKE these medications    albuterol  108 (90 Base) MCG/ACT inhaler Commonly known as: Ventolin  HFA Inhale 2 puffs into the lungs every 4 (four) hours as needed for wheezing or shortness of breath. What changed:  when to take this Another medication with the same name was removed. Continue taking this medication, and follow the directions you see here.   atorvastatin  40 MG tablet Commonly known as: LIPITOR Take 1 tablet (40 mg total) by mouth daily for high cholesterol and heart health .   Breztri  Aerosphere 160-9-4.8 MCG/ACT Aero inhaler Generic drug: budesonide -glycopyrrolate -formoterol  Inhale 2 puffs into the lungs in the morning and at bedtime.   fluticasone  50 MCG/ACT nasal spray Commonly known as: FLONASE  Place 2 sprays into both nostrils daily as needed for allergies.   furosemide  20 MG tablet Commonly known as: LASIX  Take 1 tablet (20 mg total) by mouth in the morning high blood pressure and swelling . What changed: when to take this   guaiFENesin -dextromethorphan  100-10 MG/5ML syrup Commonly known as: ROBITUSSIN DM Take 5 mLs by mouth every 4 (four) hours as needed  for cough (chest congestion).   ipratropium-albuterol  0.5-2.5 (3) MG/3ML Soln Commonly known as: DUONEB Take 3 mLs by nebulization in the morning, at noon, in the evening, and at bedtime as needed for moderate to severe shortness of breath, COPD flare up or wheezing What changed:  when to take this   losartan  25 MG tablet Commonly known as: COZAAR  Take 1 tablet (25 mg total) by mouth in the morning for high blood pressure What changed: when to take this   montelukast  10 MG tablet Commonly known as: SINGULAIR  Take 1 tablet (10 mg total) by mouth every evening for flare up and seasonal allergies.   predniSONE  20 MG tablet Commonly known as: DELTASONE  Take 2 tablets (40 mg total) by mouth daily with breakfast for 7 days. Start taking on: January 24, 2024        Follow-up Information     [facility] (DME) Follow up.   Specialty: DME Services Why: Agency provides home oxygen Contact information: [address] [place]  [address] [phone]        PCP. Schedule an appointment as soon as possible for a visit in 1 week(s).                 Allergies[1]  Consultations: None   Procedures/Studies: DG Chest Port 1 View Result Date: 01/19/2024 CLINICAL DATA:  Shortness of breath. EXAM: PORTABLE CHEST 1 VIEW COMPARISON:  12/16/2023. FINDINGS: Mild hyperexpansion. Airspace disease seen previously at the left base has decreased in the interval but is not completely resolved. Streaky density in the infrahilar right lung base may be atelectasis although infiltrate is not excluded in the appearance is more conspicuous than on the prior study. The cardio pericardial silhouette is enlarged. Telemetry leads overlie the chest. IMPRESSION: 1. Interval decrease in left basilar airspace disease. 2. New streaky density in the infrahilar right lung base may be atelectasis although infiltrate is not excluded. Electronically Signed   By: Camellia Candle M.D.   On: 01/19/2024 08:50      Subjective: Patient seen and examined at bedside.  She feels better and feels ready to go home today.  Still short of breath with some exertion.  Has intermittent cough.  No fever or vomiting reported.  Discharge Exam: Vitals:   01/23/24  0529 01/23/24 0746  BP: 103/62   Pulse: 67   Resp: 16   Temp: 97.8 F (36.6 C)   SpO2: 100% 98%    General: Pt is alert, awake, not in acute distress.  On 5 to 6 L oxygen by nasal cannula.  Chronically ill and deconditioned. Cardiovascular: rate controlled, S1/S2 + Respiratory: bilateral decreased breath sounds at bases with scattered crackles and wheezing Abdominal: Soft, obese, NT, ND, bowel sounds + Extremities: Trace lower extremity edema; no cyanosis    The results of significant diagnostics from this hospitalization (including imaging, microbiology, ancillary and laboratory) are listed below for reference.     Microbiology: Recent Results (from the past 240 hours)  Culture, blood (Routine x 2)     Status: None (Preliminary result)   Collection Time: 01/19/24  6:50 AM   Specimen: BLOOD  Result Value Ref Range Status   Specimen Description   Final    BLOOD SITE NOT SPECIFIED Performed at [facility], [address] [place], [place] [address]    Special Requests   Final    BOTTLES DRAWN AEROBIC AND ANAEROBIC Blood Culture adequate volume Performed at [facility],  [address] [place], [place] [address]    Culture   Final    NO GROWTH 4 DAYS Performed at [facility], [address] [place], [place] [address]    Report Status PENDING  Incomplete  Resp panel by RT-PCR (RSV, Flu A&B, Covid) Anterior Nasal Swab     Status: None   Collection Time: 01/19/24  6:58 AM   Specimen: Anterior Nasal Swab  Result Value Ref Range Status   SARS Coronavirus 2 by RT PCR NEGATIVE NEGATIVE Final    Comment: (NOTE) SARS-CoV-2 target nucleic acids are NOT DETECTED.  The SARS-CoV-2 RNA is generally detectable in upper respiratory specimens during the acute phase of infection. The lowest concentration of SARS-CoV-2 viral copies this assay can detect is 138 copies/mL. A negative result does not preclude SARS-Cov-2 infection and should  not be used as the sole basis for treatment or other patient management decisions. A negative result may occur with  improper specimen collection/handling, submission of specimen other than nasopharyngeal swab, presence of viral mutation(s) within the areas targeted by this assay, and inadequate number of viral copies(<138 copies/mL). A negative result must be combined with clinical observations, patient history, and epidemiological information. The expected result is Negative.  Fact Sheet for Patients:  bloggercourse.com  Fact Sheet for Healthcare Providers:  seriousbroker.it  This test is no t yet approved or cleared by the [place]  FDA and  has been authorized for detection and/or diagnosis of SARS-CoV-2 by FDA under an Emergency Use Authorization (EUA). This EUA will remain  in effect (meaning this test can be used) for the duration of the COVID-19 declaration under Section 564(b)(1) of the Act, 21 U.S.C.section 360bbb-3(b)(1), unless the authorization is terminated  or revoked sooner.       Influenza A by PCR NEGATIVE NEGATIVE Final   Influenza B by PCR NEGATIVE NEGATIVE Final    Comment: (NOTE) The Xpert Xpress SARS-CoV-2/FLU/RSV plus assay is intended as an aid in the diagnosis of influenza from Nasopharyngeal swab specimens and should not be used as a sole basis for treatment. Nasal washings and aspirates are unacceptable for Xpert Xpress SARS-CoV-2/FLU/RSV testing.  Fact Sheet for Patients: bloggercourse.com  Fact Sheet for Healthcare Providers: seriousbroker.it  This test is not yet approved or cleared by the [place]  FDA and has been authorized for detection and/or diagnosis of SARS-CoV-2 by FDA under an Emergency Use Authorization (EUA). This EUA will remain in effect (meaning this test can be used) for the duration of the COVID-19 declaration under  Section 564(b)(1) of the Act, 21 U.S.C. section 360bbb-3(b)(1), unless the authorization is terminated or revoked.     Resp Syncytial Virus by PCR NEGATIVE NEGATIVE Final    Comment: (NOTE) Fact Sheet for Patients: bloggercourse.com  Fact Sheet for Healthcare Providers: seriousbroker.it  This test is not yet approved or cleared by the [place]  FDA and has been authorized for detection and/or diagnosis of SARS-CoV-2 by FDA under an Emergency Use Authorization (EUA). This EUA will remain in effect (meaning this test can be used) for the duration of the COVID-19 declaration under Section 564(b)(1) of the Act, 21 U.S.C. section 360bbb-3(b)(1), unless the authorization is terminated or revoked.  Performed at [facility], [address], [place], [place] [address]   Culture, blood (Routine x 2)     Status: None (Preliminary result)   Collection Time: 01/19/24  6:59 AM   Specimen: BLOOD RIGHT WRIST  Result Value Ref Range Status   Specimen  Description   Final    BLOOD RIGHT WRIST Performed at [facility], [address], [place], [place] [address]    Special Requests   Final    BOTTLES DRAWN AEROBIC AND ANAEROBIC Blood Culture adequate volume Performed at [facility], [address] [place], [place] [address]    Culture   Final    NO GROWTH 4 DAYS Performed at [facility], [address], [place], [place] [address]    Report Status PENDING  Incomplete     Labs: BNP (last 3 results) No results for input(s): BNP in the last 8760 hours. Basic Metabolic Panel: Recent Labs  Lab 01/19/24 0657 01/20/24 0522  NA 140 140  K 4.3 4.2  CL 99 101  CO2 35* 36*  GLUCOSE 124* 111*  BUN 11 11  CREATININE 0.92 0.79  CALCIUM  9.0 8.6*   Liver Function Tests: Recent Labs  Lab 01/19/24 0657  AST 31  ALT 17  ALKPHOS 104  BILITOT 0.4  PROT 6.8  ALBUMIN 4.0   No  results for input(s): LIPASE, AMYLASE in the last 168 hours. No results for input(s): AMMONIA in the last 168 hours. CBC: Recent Labs  Lab 01/19/24 0657 01/20/24 0522  WBC 5.1 3.5*  NEUTROABS 2.1  --   HGB 14.6 12.9*  HCT 44.9 38.6*  MCV 98.5 96.0  PLT 127* 130*   Cardiac Enzymes: No results for input(s): CKTOTAL, CKMB, CKMBINDEX, TROPONINI in the last 168 hours. BNP: Invalid input(s): POCBNP CBG: No results for input(s): GLUCAP in the last 168 hours. D-Dimer No results for input(s): DDIMER in the last 72 hours. Hgb A1c No results for input(s): HGBA1C in the last 72 hours. Lipid Profile No results for input(s): CHOL, HDL, LDLCALC, TRIG, CHOLHDL, LDLDIRECT in the last 72 hours. Thyroid function studies No results for input(s): TSH, T4TOTAL, T3FREE, THYROIDAB in the last 72 hours.  Invalid input(s): FREET3 Anemia work up No results for input(s): VITAMINB12, FOLATE, FERRITIN, TIBC, IRON, RETICCTPCT in the last 72 hours. Urinalysis    Component Value Date/Time   COLORURINE YELLOW 06/11/2017 1927   APPEARANCEUR CLEAR 06/11/2017 1927   LABSPEC 1.014 06/11/2017 1927   PHURINE 5.0 06/11/2017 1927   GLUCOSEU NEGATIVE 06/11/2017 1927   HGBUR MODERATE (A) 06/11/2017 1927   BILIRUBINUR NEGATIVE 06/11/2017 1927   KETONESUR NEGATIVE 06/11/2017 1927   PROTEINUR NEGATIVE 06/11/2017 1927   UROBILINOGEN 1.0 05/28/2014 1848   NITRITE NEGATIVE 06/11/2017 1927   LEUKOCYTESUR NEGATIVE 06/11/2017 1927   Sepsis Labs Recent Labs  Lab 01/19/24 0657 01/20/24 0522  WBC 5.1 3.5*   Microbiology Recent Results (from the past 240 hours)  Culture, blood (Routine x 2)     Status: None (Preliminary result)   Collection Time: 01/19/24  6:50 AM   Specimen: BLOOD  Result Value Ref Range Status   Specimen Description   Final    BLOOD SITE NOT SPECIFIED Performed at [facility], [address], [place], [place]  [address]    Special Requests   Final    BOTTLES DRAWN AEROBIC AND ANAEROBIC Blood Culture adequate volume Performed at [facility], [address] [place], [place] [address]    Culture   Final    NO GROWTH 4 DAYS Performed at [facility], [address] [place], [place] [address]    Report Status PENDING  Incomplete  Resp panel by RT-PCR (RSV, Flu A&B, Covid) Anterior Nasal Swab  Status: None   Collection Time: 01/19/24  6:58 AM   Specimen: Anterior Nasal Swab  Result Value Ref Range Status   SARS Coronavirus 2 by RT PCR NEGATIVE NEGATIVE Final    Comment: (NOTE) SARS-CoV-2 target nucleic acids are NOT DETECTED.  The SARS-CoV-2 RNA is generally detectable in upper respiratory specimens during the acute phase of infection. The lowest concentration of SARS-CoV-2 viral copies this assay can detect is 138 copies/mL. A negative result does not preclude SARS-Cov-2 infection and should not be used as the sole basis for treatment or other patient management decisions. A negative result may occur with  improper specimen collection/handling, submission of specimen other than nasopharyngeal swab, presence of viral mutation(s) within the areas targeted by this assay, and inadequate number of viral copies(<138 copies/mL). A negative result must be combined with clinical observations, patient history, and epidemiological information. The expected result is Negative.  Fact Sheet for Patients:  bloggercourse.com  Fact Sheet for Healthcare Providers:  seriousbroker.it  This test is no t yet approved or cleared by the [place]  FDA and  has been authorized for detection and/or diagnosis of SARS-CoV-2 by FDA under an Emergency Use Authorization (EUA). This EUA will remain  in effect (meaning this test can be used) for the duration of the COVID-19 declaration under Section 564(b)(1) of the Act, 21 U.S.C.section  360bbb-3(b)(1), unless the authorization is terminated  or revoked sooner.       Influenza A by PCR NEGATIVE NEGATIVE Final   Influenza B by PCR NEGATIVE NEGATIVE Final    Comment: (NOTE) The Xpert Xpress SARS-CoV-2/FLU/RSV plus assay is intended as an aid in the diagnosis of influenza from Nasopharyngeal swab specimens and should not be used as a sole basis for treatment. Nasal washings and aspirates are unacceptable for Xpert Xpress SARS-CoV-2/FLU/RSV testing.  Fact Sheet for Patients: bloggercourse.com  Fact Sheet for Healthcare Providers: seriousbroker.it  This test is not yet approved or cleared by the [place]  FDA and has been authorized for detection and/or diagnosis of SARS-CoV-2 by FDA under an Emergency Use Authorization (EUA). This EUA will remain in effect (meaning this test can be used) for the duration of the COVID-19 declaration under Section 564(b)(1) of the Act, 21 U.S.C. section 360bbb-3(b)(1), unless the authorization is terminated or revoked.     Resp Syncytial Virus by PCR NEGATIVE NEGATIVE Final    Comment: (NOTE) Fact Sheet for Patients: bloggercourse.com  Fact Sheet for Healthcare Providers: seriousbroker.it  This test is not yet approved or cleared by the [place]  FDA and has been authorized for detection and/or diagnosis of SARS-CoV-2 by FDA under an Emergency Use Authorization (EUA). This EUA will remain in effect (meaning this test can be used) for the duration of the COVID-19 declaration under Section 564(b)(1) of the Act, 21 U.S.C. section 360bbb-3(b)(1), unless the authorization is terminated or revoked.  Performed at [facility], [address], [place], [place] [address]   Culture, blood (Routine x 2)     Status: None (Preliminary result)   Collection Time: 01/19/24  6:59 AM   Specimen: BLOOD RIGHT WRIST   Result Value Ref Range Status   Specimen Description   Final    BLOOD RIGHT WRIST Performed at [facility], [address] [place], [place] [address]    Special Requests   Final    BOTTLES DRAWN AEROBIC AND ANAEROBIC Blood Culture adequate volume Performed at [facility], [address], [place], [place] [address]  Culture   Final    NO GROWTH 4 DAYS Performed at [facility], [address] [place], [place] [address]    Report Status PENDING  Incomplete     Time coordinating discharge: 35 minutes  SIGNED:   Sophie Mao, MD  [facility] 01/23/2024, 10:03 AM      [1]  Allergies Allergen Reactions   Codeine Itching   Penicillins Itching

## 2024-01-23 NOTE — Care Management Important Message (Signed)
 Important Message  Patient Details IM Letter given. Name: [name] MRN: [id number] Date of Birth: [birth year]   Important Message Given:  Yes - Medicare IM     [name] 01/23/2024, 11:03 AM

## 2024-01-23 NOTE — Progress Notes (Signed)
 Discharge meds in a secure bag delivered to patient by this RN

## 2024-01-23 NOTE — Plan of Care (Signed)
  Problem: Health Behavior/Discharge Planning: Goal: Ability to manage health-related needs will improve Outcome: Adequate for Discharge   Problem: Clinical Measurements: Goal: Will remain free from infection Outcome: Adequate for Discharge   Problem: Clinical Measurements: Goal: Respiratory complications will improve Outcome: Adequate for Discharge

## 2024-01-23 NOTE — TOC Transition Note (Signed)
 Transition of Care [facility]) - Discharge Note   Patient Details  Name: [name] MRN: [id number] Date of Birth: [birth year]  [facility] CM/SW Contact:  Sonda Manuella Quill, RN Phone Number: 01/23/2024, 9:44 AM   Clinical Narrative:    D/C orders received; no IP CM needs.   Final next level of care: Home/Self Care Barriers to Discharge: No Barriers Identified   Patient Goals and CMS Choice            Discharge Placement                    Patient and family notified of of transfer: 01/23/24  Discharge Plan and Services Additional resources added to the After Visit Summary for   In-house Referral: Clinical Social Work              DME Arranged: N/A DME Agency: NA       HH Arranged: NA HH Agency: NA        Social Drivers of Health (SDOH) Interventions SDOH Screenings   Food Insecurity: No Food Insecurity (01/19/2024)  Housing: Low Risk (01/19/2024)  Recent Concern: Housing - High Risk (12/14/2023)  Transportation Needs: No Transportation Needs (01/19/2024)  Utilities: Not At Risk (01/19/2024)  Alcohol Screen: Low Risk (05/06/2022)  Depression (PHQ2-9): Low Risk (01/09/2023)  Financial Resource Strain: Medium Risk (01/09/2023)  Physical Activity: Insufficiently Active (01/09/2023)  Social Connections: Socially Isolated (01/19/2024)  Stress: No Stress Concern Present (05/06/2022)  Tobacco Use: High Risk (01/19/2024)  Health Literacy: Adequate Health Literacy (01/09/2023)     Readmission Risk Interventions    12/17/2023    3:01 PM 12/16/2022   11:30 AM 05/13/2022   10:11 AM  Readmission Risk Prevention Plan  Post Dischage Appt Complete Complete Complete  Medication Screening Complete Complete Complete  Transportation Screening Complete Complete Complete

## 2024-01-24 LAB — CULTURE, BLOOD (ROUTINE X 2)
Culture: NO GROWTH
Culture: NO GROWTH
Special Requests: ADEQUATE
Special Requests: ADEQUATE

## 2024-01-27 ENCOUNTER — Other Ambulatory Visit: Payer: Self-pay

## 2024-01-27 ENCOUNTER — Ambulatory Visit (INDEPENDENT_AMBULATORY_CARE_PROVIDER_SITE_OTHER): Admitting: Acute Care

## 2024-01-27 ENCOUNTER — Encounter: Payer: Self-pay | Admitting: Acute Care

## 2024-01-27 VITALS — BP 148/84 | HR 106 | Temp 97.8°F | Ht 69.0 in | Wt 240.0 lb

## 2024-01-27 DIAGNOSIS — R918 Other nonspecific abnormal finding of lung field: Secondary | ICD-10-CM | POA: Diagnosis not present

## 2024-01-27 DIAGNOSIS — J9620 Acute and chronic respiratory failure, unspecified whether with hypoxia or hypercapnia: Secondary | ICD-10-CM

## 2024-01-27 DIAGNOSIS — J189 Pneumonia, unspecified organism: Secondary | ICD-10-CM

## 2024-01-27 DIAGNOSIS — F17211 Nicotine dependence, cigarettes, in remission: Secondary | ICD-10-CM

## 2024-01-27 DIAGNOSIS — J449 Chronic obstructive pulmonary disease, unspecified: Secondary | ICD-10-CM

## 2024-01-27 DIAGNOSIS — J441 Chronic obstructive pulmonary disease with (acute) exacerbation: Secondary | ICD-10-CM

## 2024-01-27 DIAGNOSIS — R911 Solitary pulmonary nodule: Secondary | ICD-10-CM

## 2024-01-27 DIAGNOSIS — Z87891 Personal history of nicotine dependence: Secondary | ICD-10-CM

## 2024-01-27 DIAGNOSIS — Z09 Encounter for follow-up examination after completed treatment for conditions other than malignant neoplasm: Secondary | ICD-10-CM

## 2024-01-27 DIAGNOSIS — R9389 Abnormal findings on diagnostic imaging of other specified body structures: Secondary | ICD-10-CM

## 2024-01-27 MED ORDER — ALBUTEROL SULFATE HFA 108 (90 BASE) MCG/ACT IN AERS
2.0000 | INHALATION_SPRAY | RESPIRATORY_TRACT | 4 refills | Status: AC | PRN
  Filled 2024-01-27: qty 17, fill #0
  Filled 2024-02-09 – 2024-02-16 (×2): qty 6.7, 25d supply, fill #0

## 2024-01-27 MED ORDER — BREZTRI AEROSPHERE 160-9-4.8 MCG/ACT IN AERO
2.0000 | INHALATION_SPRAY | Freq: Two times a day (BID) | RESPIRATORY_TRACT | 6 refills | Status: AC
  Filled 2024-01-27 – 2024-02-16 (×3): qty 10.7, 30d supply, fill #0

## 2024-01-27 NOTE — Patient Instructions (Addendum)
 It is good to see you today. I am glad you are feeling better.  Please take Breztri  2 puffs in the morning and 2 puffs in the evening.  Rinse mouth after use. Work hard on not running out of this.  Use albuterol  as needed for breakthrough shortness of breath or wheezing.  Wear your oxygen at 6 L [place] to maintain oxygen saturations greater than 88%.  OK to titrate this down as long as your oxygen saturations are > 88 %.  We will schedule you for PFT's in the next 2 weeks. Your next CT Chest is due 02/2024 to follow up your nodule. You will get a call to get this scheduled closer to the time it is due. You will follow up with me 1-2 weeks after the scan to review the scan results and the PFT results.  Note your daily symptoms > remember red flags for COPD:  Increase in cough, increase in sputum production, increase in shortness of breath or activity intolerance. If you notice these symptoms, please call to be seen.    Please contact office for sooner follow up if symptoms do not improve or worsen or seek emergency care   Congratulations on quitting smoking. Keep it up. Continue Nicorette gum when you get the urge to quit.

## 2024-01-27 NOTE — Progress Notes (Signed)
 "  History of Present Illness [name] is a 69 y.o. male current every day smoker followed by Dr. Kara and the lung cancer screening program. He has been referred for an abnormal lung cancer screening scan.    01/27/2024 Discussed the use of AI scribe software for clinical note transcription with the patient, who gave verbal consent to proceed.  History of Present Illness Pt. Presents for  follow up after recent hospitalization for sepsis 2/2 pneumonia and  COPD exacerbation.He was admitted from 01/19/2024-01/23/2024. He was treated with IV antibiotics, steroids, BD, oxygen and was discharged home after 5 days of oral levaquin  in patient  and oral prednisone  upon discharge. He presents today off his oxygen ( it is in his car) sats are 94% on Room Air. He was started on Breztri  and albuterol . He states he is compliant with his inhalers. He states he is using his rescue inhaler ( 40 puffs in 5 days , so about 4 times a day). His cough is productive , and it is clear today. He states he does have some intermittent wheezes. We have sent refills for both breztri  and albuterol . He understand he cannot run out of his medication as that is when he usually ends up in the hospital. He verbalized understanding. He states he is feeling much better. Sats and physical exam confirm this.  BP is elevated in the office today. BP was initially 156/70, on recheck he is 148/84. He states he has not had his BP medication yet today. We advised him to take his blood pressure once he gets home.  Pt has not had a cigarette since 01/13/2024. We congratulated him on quitting . We encouraged him to remain smoke free.  He is due for CT chest follow up 02/2024. We are monitoring pulmonary nodules that are suspected to be infectious / inflammatory. He will follow up after the scan to review the results. We will also schedule PFT's as he has never had these done to stage his COPD.      Test Results: CXR 01/19/2024 Mild  hyperexpansion. Airspace disease seen previously at the left base has decreased in the interval but is not completely resolved. Streaky density in the infrahilar right lung base may be atelectasis although infiltrate is not excluded in the appearance is more conspicuous than on the prior study. The cardio pericardial silhouette is enlarged. Telemetry leads overlie the chest.   IMPRESSION: 1. Interval decrease in left basilar airspace disease. 2. New streaky density in the infrahilar right lung base may be atelectasis although infiltrate is not excluded.    Latest Ref Rng & Units 01/20/2024    5:22 AM 01/19/2024    6:57 AM 12/18/2023    3:02 AM  CBC  WBC 4.0 - 10.5 K/uL 3.5  5.1  8.5   Hemoglobin 13.0 - 17.0 g/dL 87.0  85.3  86.9   Hematocrit 39.0 - 52.0 % 38.6  44.9  39.7   Platelets 150 - 400 K/uL 130  127  190        Latest Ref Rng & Units 01/20/2024    5:22 AM 01/19/2024    6:57 AM 12/18/2023    3:02 AM  BMP  Glucose 70 - 99 mg/dL 888  875  874   BUN 8 - 23 mg/dL 11  11  15    Creatinine 0.61 - 1.24 mg/dL 9.20  9.07  9.21   Sodium 135 - 145 mmol/L 140  140  139   Potassium 3.5 -  5.1 mmol/L 4.2  4.3  3.5   Chloride 98 - 111 mmol/L 101  99  102   CO2 22 - 32 mmol/L 36  35  30   Calcium  8.9 - 10.3 mg/dL 8.6  9.0  8.5     BNP    Component Value Date/Time   BNP 34.0 12/12/2022 2203    ProBNP    Component Value Date/Time   PROBNP 138.0 12/14/2023 0705    PFT No results found for: FEV1PRE, FEV1POST, FVCPRE, FVCPOST, TLC, DLCOUNC, PREFEV1FVCRT, PSTFEV1FVCRT  DG Chest Port 1 View Result Date: 01/19/2024 CLINICAL DATA:  Shortness of breath. EXAM: PORTABLE CHEST 1 VIEW COMPARISON:  12/16/2023. FINDINGS: Mild hyperexpansion. Airspace disease seen previously at the left base has decreased in the interval but is not completely resolved. Streaky density in the infrahilar right lung base may be atelectasis although infiltrate is not excluded in the appearance is more  conspicuous than on the prior study. The cardio pericardial silhouette is enlarged. Telemetry leads overlie the chest. IMPRESSION: 1. Interval decrease in left basilar airspace disease. 2. New streaky density in the infrahilar right lung base may be atelectasis although infiltrate is not excluded. Electronically Signed   By: Camellia Candle M.D.   On: 01/19/2024 08:50     Past medical hx Past Medical History:  Diagnosis Date   Asthma    COPD (chronic obstructive pulmonary disease) (HCC)    Hx of adenomatous polyp of colon 01/13/2020   Hyperlipidemia 05/22/2022   Hypertension    Obesity 02/04/2012     Social History[1]  Mr.Rumler reports that he has been smoking cigarettes. He started smoking about 13 months ago. He has a 45.3 pack-year smoking history. He has never used smokeless tobacco. He reports current alcohol use of about 1.0 standard drink of alcohol per week. He reports current drug use. Frequency: 1.00 time per week. Drug: Marijuana.  Tobacco Cessation: Ready to quit: Not Answered Counseling given: Not Answered Tobacco comments: Has not smoked since before the new year Recently former smoker . Counseled to remain smoke free x 3 minutes.   Past surgical hx, Family hx, Social hx all reviewed.  Current Outpatient Medications on File Prior to Visit  Medication Sig   ipratropium-albuterol  (DUONEB) 0.5-2.5 (3) MG/3ML SOLN Take 3 mLs by nebulization in the morning, at noon, in the evening, and at bedtime as needed for moderate to severe shortness of breath, COPD flare up or wheezing   montelukast  (SINGULAIR ) 10 MG tablet Take 1 tablet (10 mg total) by mouth every evening for flare up and seasonal allergies.   predniSONE  (DELTASONE ) 20 MG tablet Take 2 tablets (40 mg total) by mouth daily with breakfast for 7 days.   albuterol  (VENTOLIN  HFA) 108 (90 Base) MCG/ACT inhaler Inhale 2 puffs into the lungs every 4 (four) hours as needed for wheezing or shortness of breath.   atorvastatin   (LIPITOR) 40 MG tablet Take 1 tablet (40 mg total) by mouth daily for high cholesterol and heart health .   budesonide -glycopyrrolate -formoterol  (BREZTRI  AEROSPHERE) 160-9-4.8 MCG/ACT AERO inhaler Inhale 2 puffs into the lungs in the morning and at bedtime.   fluticasone  (FLONASE ) 50 MCG/ACT nasal spray Place 2 sprays into both nostrils daily as needed for allergies.   furosemide  (LASIX ) 20 MG tablet Take 1 tablet (20 mg total) by mouth in the morning high blood pressure and swelling . (Patient taking differently: Take 20 mg by mouth daily.)   Dextromethorphan -guaiFENesin  (ROBAFEN DM) 20-200 MG/20ML LIQD Take 5 mLs by  mouth every 4 (four) hours as needed (chest congestion).   losartan  (COZAAR ) 25 MG tablet Take 1 tablet (25 mg total) by mouth in the morning for high blood pressure (Patient taking differently: Take 25 mg by mouth daily.)   No current facility-administered medications on file prior to visit.     Allergies[2]  Review Of Systems:  Constitutional:   No  weight loss, night sweats,  Fevers, chills, fatigue, or  lassitude.  HEENT:   No headaches,  Difficulty swallowing,  Tooth/dental problems, or  Sore throat,                No sneezing, itching, ear ache, nasal congestion, post nasal drip,   CV:  No chest pain,  Orthopnea, PND, swelling in lower extremities, anasarca, dizziness, palpitations, syncope.   GI  No heartburn, indigestion, abdominal pain, nausea, vomiting, diarrhea, change in bowel habits, loss of appetite, bloody stools.   Resp: + shortness of breath with exertion less at rest.  No excess mucus, no productive cough,  No non-productive cough,  No coughing up of blood.  No change in color of mucus.  No wheezing.  No chest wall deformity + Baseline symptoms, no symptoms of flare  Skin: no rash or lesions.  GU: no dysuria, change in color of urine, no urgency or frequency.  No flank pain, no hematuria   MS:  No joint pain or swelling.  No decreased range of motion.  No  back pain.  Psych:  No change in mood or affect. No depression or anxiety.  No memory loss.   Vital Signs BP (!) 156/70   Pulse (!) 106   Temp 97.8 F (36.6 C) (Oral)   Ht 5' 9 (1.753 m)   Wt 240 lb (108.9 kg)   SpO2 94% Comment: was 89% upon,gradually came up  BMI 35.44 kg/m    Physical Exam:  General- No distress,  A&Ox3, pleasant ENT: No sinus tenderness, TM clear, pale nasal mucosa, no oral exudate,no post nasal drip, no LAN Cardiac: S1, S2, regular rate and rhythm, no murmur Chest: + inspiratory wheeze left upper and lower lobe/ rales/ dullness; no accessory muscle use, no nasal flaring, no sternal retractions Abd.: Soft Non-tender, ND, BS +, Body mass index is 35.44 kg/m.  Ext: No clubbing cyanosis, edema, no obvious deformities Neuro:  normal strength, MAE x 4, A&O x 3 Skin: No rashes, warm and dry, no obvious skin lesions  Psych: normal mood and behavior  Physical Exam    Assessment/Plan Hospital Follow up for pneumonia causing sepsis and Acute on Chronic Respiratory Failure Home oxygen use COPD Lung nodules Recently former smoker Plan I am glad you are feeling better.  Please take Breztri  2 puffs in the morning and 2 puffs in the evening.  Rinse mouth after use. Work hard on not running out of this.  Use albuterol  as needed for breakthrough shortness of breath or wheezing.  Wear your oxygen at 6 L [place] to maintain oxygen saturations greater than 88%.  OK to titrate this down as long as your oxygen saturations are > 88 %.  We will schedule you for PFT's in the next 2 weeks. Your next CT Chest is due 02/2024 to follow up your nodule. You will get a call to get this scheduled closer to the time it is due. You will follow up with me 1-2 weeks after the scan to review the scan results and the PFT results.  Note your daily symptoms > remember red  flags for COPD:  Increase in cough, increase in sputum production, increase in shortness of breath or activity  intolerance. If you notice these symptoms, please call to be seen.    Please contact office for sooner follow up if symptoms do not improve or worsen or seek emergency care   Congratulations on quitting smoking. Keep it up. Continue Nicorette gum when you get the urge to quit.    I spent 30 minutes dedicated to the care of this patient on the date of this encounter to include pre-visit review of records, face-to-face time with the patient discussing conditions above, post visit ordering of testing, clinical documentation with the electronic health record, making appropriate referrals as documented, and communicating necessary information to the patient's healthcare team.   Assessment & Plan        Lauraine JULIANNA Lites, NP 01/27/2024  9:23 AM             [1]  Social History Tobacco Use   Smoking status: Every Day    Current packs/day: 0.30    Average packs/day: 1 pack/day for 46.1 years (45.3 ttl pk-yrs)    Types: Cigarettes    Start date: 12/2022   Smokeless tobacco: Never   Tobacco comments:    Has not smoked since before the new year  Vaping Use   Vaping status: Never Used  Substance Use Topics   Alcohol use: Yes    Alcohol/week: 1.0 standard drink of alcohol    Types: 1 Standard drinks or equivalent per week    Comment: every once in awhile-3 times a month per pt   Drug use: Yes    Frequency: 1.0 times per week    Types: Marijuana    Comment: occasionally marijuana-3 times a month per pt  [2]  Allergies Allergen Reactions   Codeine Itching   Penicillins Itching   "

## 2024-02-04 ENCOUNTER — Other Ambulatory Visit: Payer: Self-pay

## 2024-02-04 MED ORDER — ROFLUMILAST 500 MCG PO TABS
500.0000 ug | ORAL_TABLET | Freq: Every day | ORAL | 3 refills | Status: AC
  Filled 2024-02-04: qty 90, 90d supply, fill #0

## 2024-02-09 ENCOUNTER — Other Ambulatory Visit: Payer: Self-pay

## 2024-02-13 ENCOUNTER — Other Ambulatory Visit: Payer: Self-pay

## 2024-02-16 ENCOUNTER — Other Ambulatory Visit: Payer: Self-pay

## 2024-02-17 ENCOUNTER — Other Ambulatory Visit (HOSPITAL_BASED_OUTPATIENT_CLINIC_OR_DEPARTMENT_OTHER): Payer: Self-pay

## 2024-02-17 DIAGNOSIS — R0602 Shortness of breath: Secondary | ICD-10-CM

## 2024-02-19 ENCOUNTER — Ambulatory Visit (HOSPITAL_BASED_OUTPATIENT_CLINIC_OR_DEPARTMENT_OTHER)

## 2024-02-19 DIAGNOSIS — R0602 Shortness of breath: Secondary | ICD-10-CM

## 2024-02-19 LAB — PULMONARY FUNCTION TEST
DL/VA % pred: 88 %
DL/VA: 3.66 ml/min/mmHg/L
DLCO cor % pred: 57 %
DLCO cor: 14.77 ml/min/mmHg
DLCO unc % pred: 54 %
DLCO unc: 14.01 ml/min/mmHg
FEF 25-75 Post: 0.39 L/s
FEF 25-75 Pre: 0.33 L/s
FEF2575-%Change-Post: 18 %
FEF2575-%Pred-Post: 15 %
FEF2575-%Pred-Pre: 13 %
FEV1-%Change-Post: 2 %
FEV1-%Pred-Post: 25 %
FEV1-%Pred-Pre: 24 %
FEV1-Post: 0.83 L
FEV1-Pre: 0.81 L
FEV1FVC-%Change-Post: -3 %
FEV1FVC-%Pred-Pre: 55 %
FEV6-%Change-Post: 8 %
FEV6-%Pred-Post: 49 %
FEV6-%Pred-Pre: 45 %
FEV6-Post: 2.03 L
FEV6-Pre: 1.86 L
FEV6FVC-%Change-Post: 1 %
FEV6FVC-%Pred-Post: 102 %
FEV6FVC-%Pred-Pre: 100 %
FVC-%Change-Post: 6 %
FVC-%Pred-Post: 47 %
FVC-%Pred-Pre: 44 %
FVC-Post: 2.1 L
FVC-Pre: 1.96 L
Post FEV1/FVC ratio: 39 %
Post FEV6/FVC ratio: 97 %
Pre FEV1/FVC ratio: 41 %
Pre FEV6/FVC Ratio: 95 %
RV % pred: 180 %
RV: 4.2 L
TLC % pred: 102 %
TLC: 7 L

## 2024-02-19 NOTE — Progress Notes (Signed)
 Full PFT performed today.

## 2024-02-19 NOTE — Patient Instructions (Signed)
 Full PFT performed today.
